# Patient Record
Sex: Male | Born: 1990
Health system: Southern US, Community
[De-identification: ages and names within clinical notes are randomized; demographics above are authoritative.]

## PROBLEM LIST (undated history)

## (undated) DIAGNOSIS — M419 Scoliosis, unspecified: Secondary | ICD-10-CM

## (undated) DIAGNOSIS — K439 Ventral hernia without obstruction or gangrene: Secondary | ICD-10-CM

## (undated) DIAGNOSIS — F319 Bipolar disorder, unspecified: Secondary | ICD-10-CM

## (undated) DIAGNOSIS — D179 Benign lipomatous neoplasm, unspecified: Secondary | ICD-10-CM

## (undated) DIAGNOSIS — F419 Anxiety disorder, unspecified: Secondary | ICD-10-CM

## (undated) DIAGNOSIS — G43909 Migraine, unspecified, not intractable, without status migrainosus: Secondary | ICD-10-CM

## (undated) DIAGNOSIS — F909 Attention-deficit hyperactivity disorder, unspecified type: Secondary | ICD-10-CM

## (undated) DIAGNOSIS — R569 Unspecified convulsions: Secondary | ICD-10-CM

## (undated) DIAGNOSIS — S149XXA Injury of unspecified nerves of neck, initial encounter: Secondary | ICD-10-CM

## (undated) DIAGNOSIS — G589 Mononeuropathy, unspecified: Secondary | ICD-10-CM

## (undated) DIAGNOSIS — M1990 Unspecified osteoarthritis, unspecified site: Secondary | ICD-10-CM

## (undated) DIAGNOSIS — C801 Malignant (primary) neoplasm, unspecified: Secondary | ICD-10-CM

## (undated) DIAGNOSIS — K219 Gastro-esophageal reflux disease without esophagitis: Secondary | ICD-10-CM

## (undated) HISTORY — PX: ESOPHAGUS SURGERY: SHX626

## (undated) HISTORY — DX: Unspecified convulsions: R56.9

---
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
## 2010-06-22 LAB — CBC
HCT: 38.9 % — ABNORMAL LOW (ref 39.0–52.0)
Hemoglobin: 13.4 g/dL (ref 13.0–17.0)
MCHC: 34.4 g/dL (ref 30.0–36.0)
MCV: 90.8 fL (ref 78.0–100.0)
Platelets: 248 10*3/uL (ref 150–400)
RBC: 4.28 MIL/uL (ref 4.22–5.81)
RDW: 13.1 % (ref 11.5–15.5)
WBC: 10 10*3/uL (ref 4.0–10.5)

## 2010-06-22 LAB — COMPREHENSIVE METABOLIC PANEL
ALT: 16 U/L (ref 0–53)
AST: 18 U/L (ref 0–37)
Albumin: 4.5 g/dL (ref 3.5–5.2)
Alkaline Phosphatase: 85 U/L (ref 39–117)
BUN: 12 mg/dL (ref 6–23)
CO2: 24 mEq/L (ref 19–32)
Calcium: 9.4 mg/dL (ref 8.4–10.5)
Chloride: 111 mEq/L (ref 96–112)
Creatinine, Ser: 0.66 mg/dL (ref 0.4–1.5)
GFR calc Af Amer: >60 mL/min (ref >60)
GFR calc non Af Amer: >60 mL/min (ref >60)
Glucose, Bld: 122 mg/dL — ABNORMAL HIGH (ref 70–99)
Potassium: 3.7 mEq/L (ref 3.5–5.1)
Sodium: 143 mEq/L (ref 135–145)
Total Bilirubin: 0.5 mg/dL (ref 0.3–1.2)
Total Protein: 7 g/dL (ref 6.0–8.3)

## 2010-06-22 LAB — DIFFERENTIAL
Basophils Absolute: 0 10*3/uL (ref 0.0–0.1)
Basophils Relative: 0 % (ref 0–1)
Eosinophils Absolute: 0 10*3/uL (ref 0.0–0.7)
Eosinophils Relative: 0 % (ref 0–5)
Lymphocytes Relative: 22 % (ref 12–46)
Lymphs Abs: 2.2 10*3/uL (ref 0.7–4.0)
Monocytes Absolute: 0.7 10*3/uL (ref 0.1–1.0)
Monocytes Relative: 7 % (ref 3–12)
Neutro Abs: 7 10*3/uL (ref 1.7–7.7)
Neutrophils Relative %: 70 % (ref 43–77)

## 2010-06-22 LAB — URINALYSIS, ROUTINE W REFLEX MICROSCOPIC
Bilirubin Urine: NEGATIVE
Glucose, UA: NEGATIVE mg/dL
Hgb urine dipstick: NEGATIVE
Ketones, ur: NEGATIVE mg/dL
Leukocytes, UA: NEGATIVE
Nitrite: NEGATIVE
Protein, ur: NEGATIVE mg/dL
Specific Gravity, Urine: 1.029 (ref 1.005–1.030)
Urobilinogen, UA: 1 mg/dL (ref 0.0–1.0)
pH: 7 (ref 5.0–8.0)

## 2010-06-22 LAB — LIPASE, BLOOD: Lipase: 19 U/L (ref 11–59)

## 2010-06-22 LAB — URINE MICROSCOPIC-ADD ON

## 2013-05-26 ENCOUNTER — Emergency Department (HOSPITAL_COMMUNITY): Payer: Self-pay

## 2013-05-26 ENCOUNTER — Emergency Department (HOSPITAL_COMMUNITY)
Admission: EM | Admit: 2013-05-26 | Discharge: 2013-05-26 | Disposition: A | Discharge status: Home / Self Care | Payer: Self-pay | Attending: Emergency Medicine | Admitting: Emergency Medicine

## 2013-05-26 ENCOUNTER — Encounter (HOSPITAL_COMMUNITY): Payer: Self-pay | Admitting: Emergency Medicine

## 2013-05-26 DIAGNOSIS — M549 Dorsalgia, unspecified: Secondary | ICD-10-CM | POA: Insufficient documentation

## 2013-05-26 DIAGNOSIS — R209 Unspecified disturbances of skin sensation: Secondary | ICD-10-CM | POA: Insufficient documentation

## 2013-05-26 DIAGNOSIS — Z87828 Personal history of other (healed) physical injury and trauma: Secondary | ICD-10-CM | POA: Insufficient documentation

## 2013-05-26 DIAGNOSIS — M129 Arthropathy, unspecified: Secondary | ICD-10-CM | POA: Insufficient documentation

## 2013-05-26 DIAGNOSIS — M542 Cervicalgia: Secondary | ICD-10-CM | POA: Insufficient documentation

## 2013-05-26 DIAGNOSIS — F17200 Nicotine dependence, unspecified, uncomplicated: Secondary | ICD-10-CM | POA: Insufficient documentation

## 2013-05-26 DIAGNOSIS — Z8679 Personal history of other diseases of the circulatory system: Secondary | ICD-10-CM | POA: Insufficient documentation

## 2013-05-26 HISTORY — DX: Migraine, unspecified, not intractable, without status migrainosus: G43.909

## 2013-05-26 HISTORY — DX: Scoliosis, unspecified: M41.9

## 2013-05-26 HISTORY — DX: Unspecified osteoarthritis, unspecified site: M19.90

## 2013-05-26 IMAGING — CR DG CERVICAL SPINE COMPLETE 4+V
6 series · 6 of 6 positions shown · non-contrast
Comparison: none

[view not recorded (1 of 6)]
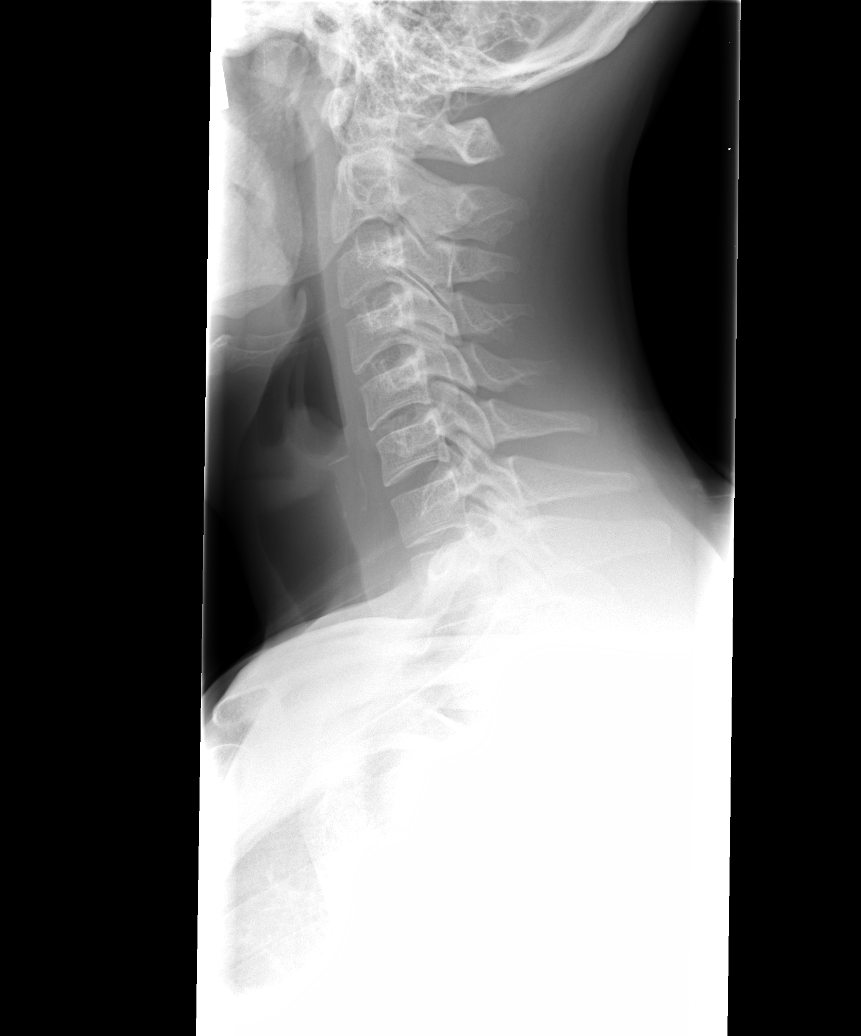

[view not recorded (2 of 6)]
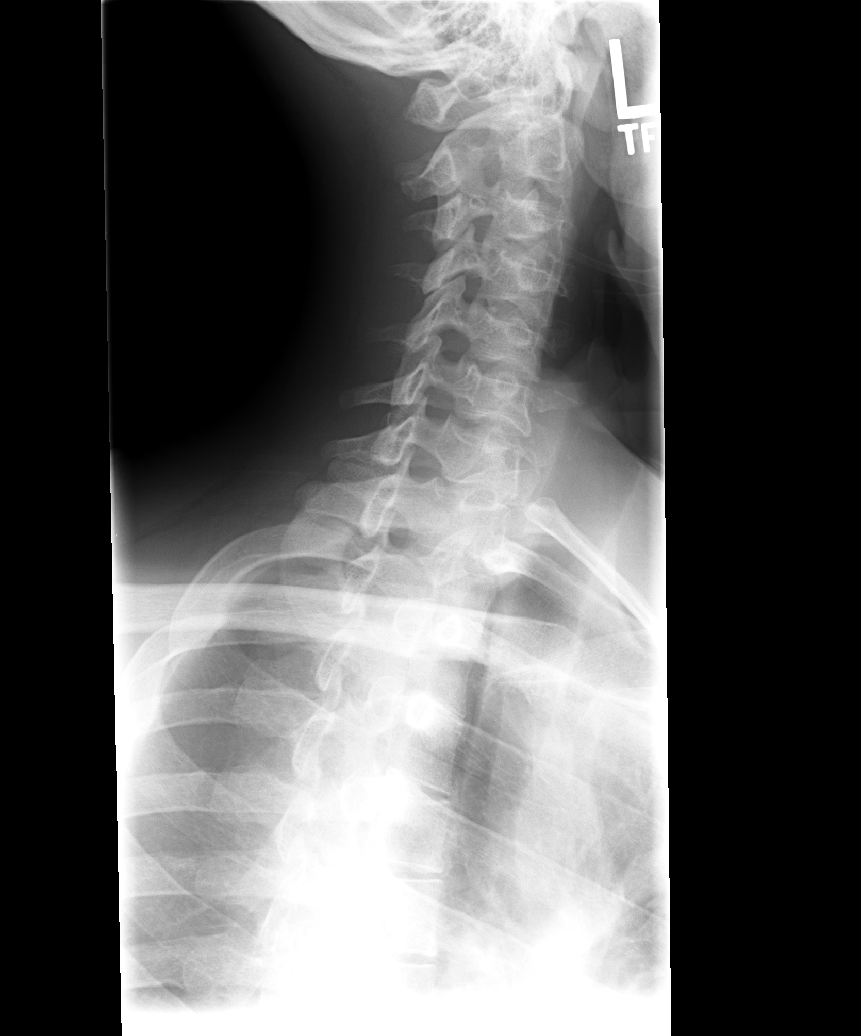

[view not recorded (3 of 6)]
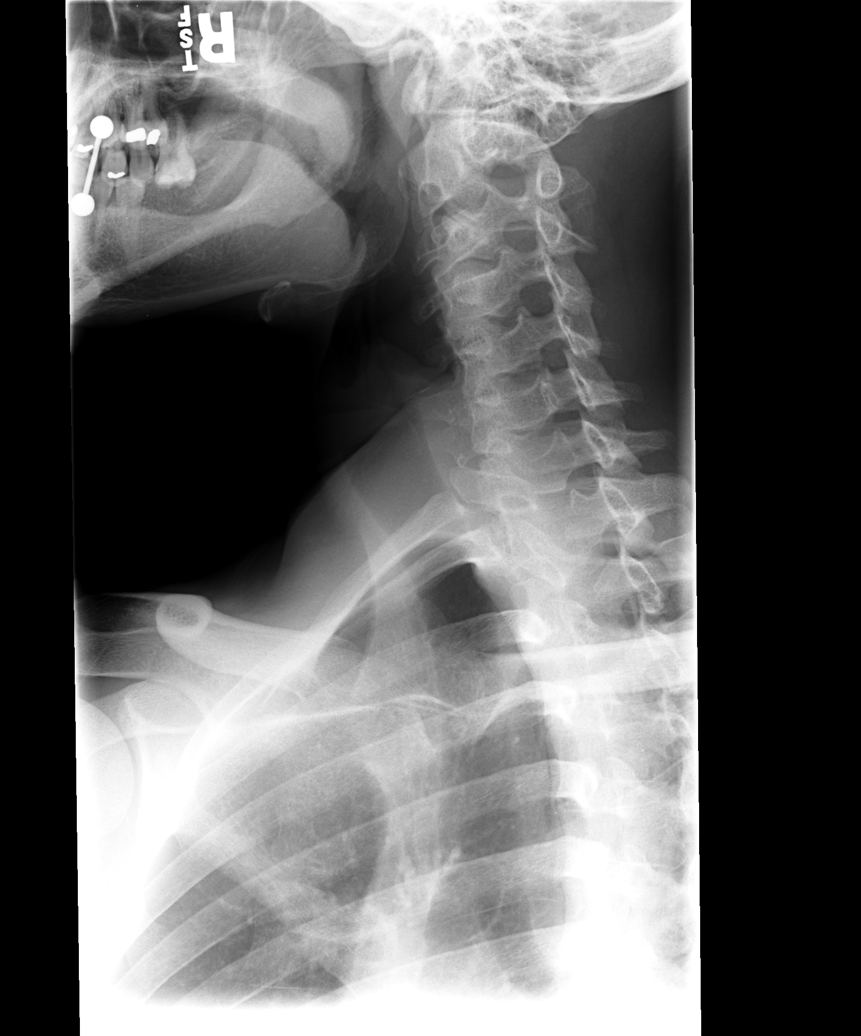

[view not recorded (4 of 6)]
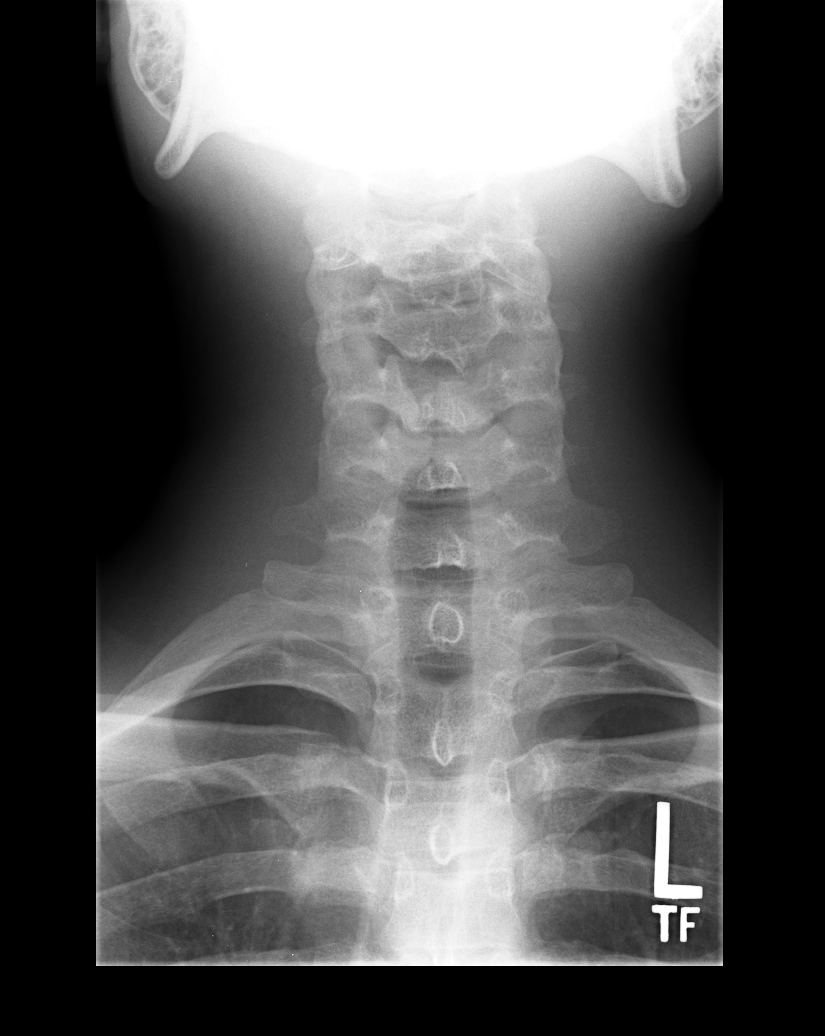

[view not recorded (5 of 6)]
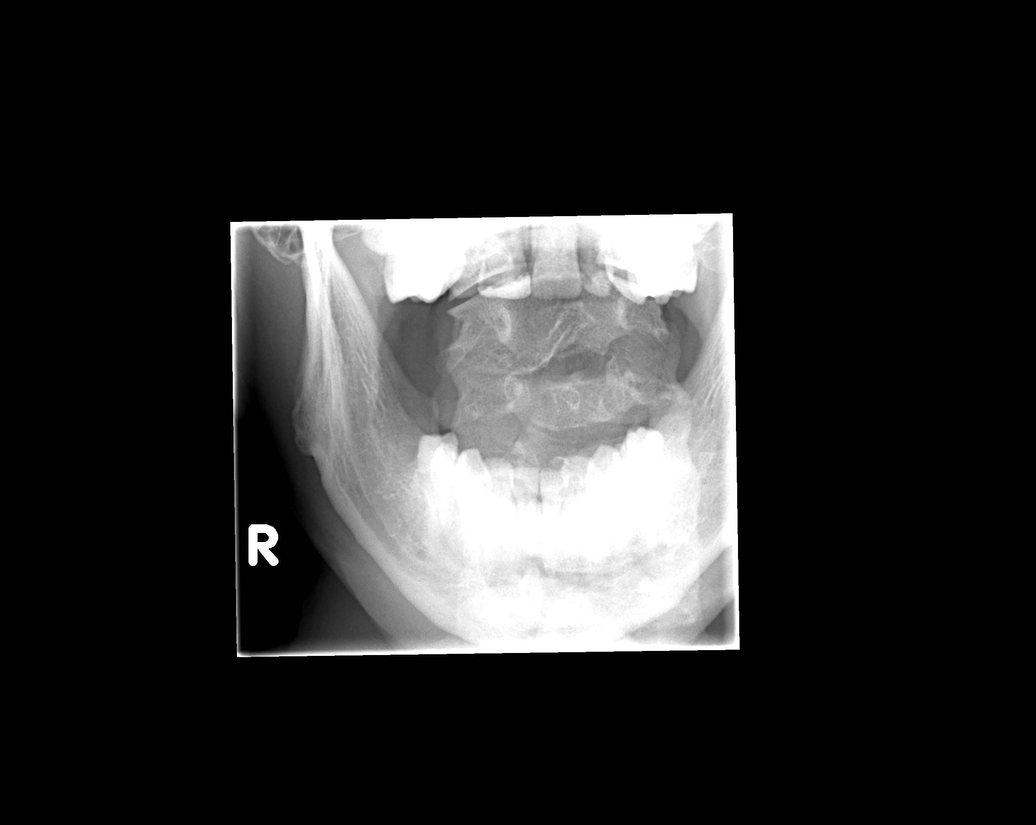

[view not recorded (6 of 6)]
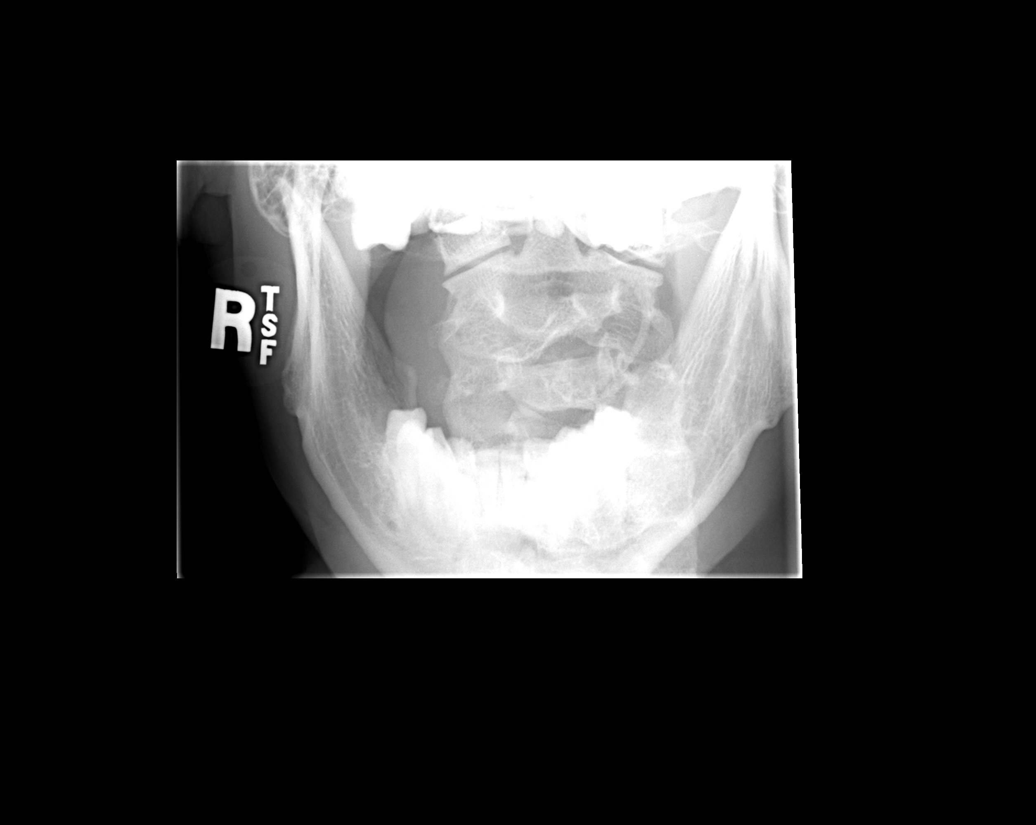

[6 of 6 positions shown; findings below may reference images not displayed]

CLINICAL DATA
Three day history of neck pain which is worse with any movement. No
known injuries

EXAM
CERVICAL SPINE  4+ VIEWS

COMPARISON
None.

FINDINGS
Straightening of the usual cervical lordosis. Anatomic alignment. No
visible fractures. Minimal to mild disc space narrowing at C5-6.
Normal prevertebral soft tissues. Facet joints intact. No
significant bony foraminal stenoses. No static evidence of
instability.

IMPRESSION
Straightening of the usual lordosis which may reflect positioning
and/or spasm. Minimal-to-mild disc space narrowing at C5-6.
Otherwise normal examination.

SIGNATURE

## 2013-05-26 MED ORDER — CYCLOBENZAPRINE HCL 5 MG PO TABS: 5.0000 mg | ORAL_TABLET | Freq: Three times a day (TID) | ORAL | Status: DC | PRN

## 2013-05-26 MED ORDER — PREDNISONE 10 MG PO TABS: ORAL_TABLET | ORAL | Status: DC

## 2013-05-26 MED ORDER — METHOCARBAMOL 500 MG PO TABS: 1000.0000 mg | ORAL_TABLET | Freq: Four times a day (QID) | ORAL | Status: AC

## 2013-05-26 MED ORDER — MELOXICAM 7.5 MG PO TABS: 7.5000 mg | ORAL_TABLET | Freq: Every day | ORAL | Status: DC

## 2013-05-26 NOTE — ED Notes (Signed)
Pt c/o chronic back pain as well as neck pain that began Sunday. Neck pain worsens with any movement. Pt denies injury but states he does a lot of heavy lifting at work.
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
## 2013-05-27 NOTE — ED Provider Notes (Signed)
Medical screening examination/treatment/procedure(s) were performed by non-physician practitioner and as supervising physician I was immediately available for consultation/collaboration.   EKG Interpretation None        Sharyon Cable, MD 05/27/13 [phone]

## 2013-07-18 ENCOUNTER — Emergency Department (HOSPITAL_COMMUNITY)
Admission: EM | Admit: 2013-07-18 | Discharge: 2013-07-18 | Disposition: A | Discharge status: Home / Self Care | Payer: Self-pay | Attending: Emergency Medicine | Admitting: Emergency Medicine

## 2013-07-18 ENCOUNTER — Encounter (HOSPITAL_COMMUNITY): Payer: Self-pay | Admitting: Emergency Medicine

## 2013-07-18 DIAGNOSIS — F411 Generalized anxiety disorder: Secondary | ICD-10-CM | POA: Insufficient documentation

## 2013-07-18 DIAGNOSIS — F419 Anxiety disorder, unspecified: Secondary | ICD-10-CM

## 2013-07-18 DIAGNOSIS — Z8679 Personal history of other diseases of the circulatory system: Secondary | ICD-10-CM | POA: Insufficient documentation

## 2013-07-18 DIAGNOSIS — M129 Arthropathy, unspecified: Secondary | ICD-10-CM | POA: Insufficient documentation

## 2013-07-18 DIAGNOSIS — F17200 Nicotine dependence, unspecified, uncomplicated: Secondary | ICD-10-CM | POA: Insufficient documentation

## 2013-07-18 HISTORY — DX: Attention-deficit hyperactivity disorder, unspecified type: F90.9

## 2013-07-18 LAB — CBC
HCT: 41.4 % (ref 39.0–52.0)
Hemoglobin: 14.3 g/dL (ref 13.0–17.0)
MCH: 30.8 pg (ref 26.0–34.0)
MCHC: 34.5 g/dL (ref 30.0–36.0)
MCV: 89 fL (ref 78.0–100.0)
Platelets: 266 10*3/uL (ref 150–400)
RBC: 4.65 MIL/uL (ref 4.22–5.81)
RDW: 13.1 % (ref 11.5–15.5)
WBC: 8.7 10*3/uL (ref 4.0–10.5)

## 2013-07-18 LAB — COMPREHENSIVE METABOLIC PANEL
ALT: 17 U/L (ref 0–53)
AST: 12 U/L (ref 0–37)
Albumin: 4.2 g/dL (ref 3.5–5.2)
Alkaline Phosphatase: 87 U/L (ref 39–117)
BUN: 11 mg/dL (ref 6–23)
CO2: 22 mEq/L (ref 19–32)
Calcium: 9.4 mg/dL (ref 8.4–10.5)
Chloride: 106 mEq/L (ref 96–112)
Creatinine, Ser: 0.62 mg/dL (ref 0.50–1.35)
GFR calc Af Amer: >90 mL/min (ref >90)
GFR calc non Af Amer: >90 mL/min (ref >90)
Glucose, Bld: 118 mg/dL — ABNORMAL HIGH (ref 70–99)
Potassium: 3.8 mEq/L (ref 3.7–5.3)
Sodium: 143 mEq/L (ref 137–147)
Total Bilirubin: 0.3 mg/dL (ref 0.3–1.2)
Total Protein: 7.1 g/dL (ref 6.0–8.3)

## 2013-07-18 LAB — RAPID URINE DRUG SCREEN, HOSP PERFORMED
Amphetamines: NOT DETECTED
Barbiturates: NOT DETECTED
Benzodiazepines: NOT DETECTED
Cocaine: NOT DETECTED
Opiates: NOT DETECTED
Tetrahydrocannabinol: NOT DETECTED

## 2013-07-18 LAB — SALICYLATE LEVEL: Salicylate Lvl: <2 mg/dL — ABNORMAL LOW (ref 2.8–20.0)

## 2013-07-18 LAB — ETHANOL: Alcohol, Ethyl (B): <11 mg/dL (ref 0–11)

## 2013-07-18 LAB — ACETAMINOPHEN LEVEL: Acetaminophen (Tylenol), Serum: <15 ug/mL (ref 10–30)

## 2013-07-18 NOTE — ED Provider Notes (Signed)
CSN: [id number]     Arrival date & time 07/18/13  1235 History   First MD Initiated Contact with Patient 07/18/13 [phone]     Chief Complaint  Patient presents with  . Stress     (Consider location/radiation/quality/duration/timing/severity/associated sxs/prior Treatment) HPI 23 year old male comes in today stating that he has ongoing or worsening stress. He states that he has had problems because of the loss of his children do to violating parole. He states his wife has subsequently had to move out of the home and ordered we'll get the children back. He states that he feels like he is shaking in his body all the time. He has some problems with sleeping but is eating and has not lost any weight. 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 He does feel better when he is actively engaged in work. He denies any suicidal or homicidal ideation. He states he has a history of being told he was bipolar. He has not taken any medications for 9 years. He has not been hospitalized in the mental health facility since childhood. He states that he was sent away for 6 weeks to 6 months at one time when he was a young child but does not know why. He states he has ADHD Past Medical History  Diagnosis Date  . Arthritis   . Scoliosis   . Migraine   . ADHD (attention deficit hyperactivity disorder)    Past Surgical History  Procedure Laterality Date  . Esophagus surgery     No family history on file. History  Substance Use Topics  . Smoking status: Current Every Day Smoker  . Smokeless tobacco: Not on file  . Alcohol Use: No    Review of Systems  All other systems reviewed and are negative.     Allergies  Depakote; Flexeril; Naproxen; Ritalin; and Tramadol  Home Medications   Prior to Admission medications   Medication Sig Start Date End Date Taking? Authorizing Provider  predniSONE (DELTASONE) 10 MG tablet 6, 5, 4, 3, 2 then 1 tablet by mouth daily for 6 days total. 05/26/13   Evalee Jefferson, PA-C    BP 132/63  Pulse 104  Temp(Src) 98.4 F (36.9 C) (Oral)  Resp 18  Ht 5\' 4"  (1.626 m)  Wt 145 lb (65.772 kg)  BMI 24.88 kg/m2  SpO2 97% Physical Exam  Nursing note and vitals reviewed. Constitutional: He is oriented to person, place, and time. He appears well-developed and well-nourished.  HENT:  Head: Normocephalic and atraumatic.  Right Ear: External ear normal.  Left Ear: External ear normal.  Nose: Nose normal.  Mouth/Throat: Oropharynx is clear and moist.  Eyes: Conjunctivae and EOM are normal. Pupils are equal, round, and reactive to light.  Neck: Normal range of motion. Neck supple.  Cardiovascular: Normal rate, regular rhythm, normal heart sounds and intact distal pulses.   Pulmonary/Chest: Effort normal and breath sounds normal. No respiratory distress. He has no wheezes. He exhibits no tenderness.  Abdominal: Soft. Bowel sounds are normal. He exhibits no distension and no mass. There is no tenderness. There is no guarding.  Musculoskeletal: Normal range of motion.  Neurological: He is alert and oriented to person, place, and time. He has normal reflexes. He exhibits normal muscle tone. Coordination normal.  Skin: Skin is warm and dry.  Psychiatric: He has a normal mood and affect. His behavior is normal. Judgment and thought content normal.  Patient denies suicidal ideation denies homicidal ideation. Endorses that he has some history of  depression but is not actively sad or crying. States he feels shaking all over but does not appear to have any shaking noted.    ED Course  Procedures (including critical care time) Labs Review Labs Reviewed  COMPREHENSIVE METABOLIC PANEL - Abnormal; Notable for the following:    Glucose, Bld 118 (*)    All other components within normal limits  SALICYLATE LEVEL - Abnormal; Notable for the following:    Salicylate Lvl <5.6 (*)    All other components within normal limits  ACETAMINOPHEN LEVEL  CBC  ETHANOL  URINE RAPID DRUG  SCREEN (HOSP PERFORMED)    Imaging Review No results found.   EKG Interpretation None      MDM   Final diagnoses:  Anxiety       Shaune Pollack, MD 07/18/13 2018

## 2013-07-18 NOTE — ED Notes (Signed)
Pt reports he is here for stress. History of migraine and ADHD, has not been taking ADHD medications and states he has a migrane. Denies pain elsewhere. States increased stress for 4 years, wife left him and kids in foster care. States he has too much stress, denies SI or HI.

## 2013-07-18 NOTE — ED Notes (Addendum)
Pt c/o being under stress ongoing for more than 4 years but increasing. Pt reports unable to sleep x 1 month. Pt reports that he had his kids taken last year to foster care and his wife left him last month. Pt denies SI/HI, just can't deal with the stress. Pt reports his youngest kid has special needs and only gets to see him once a week. Pt cannot see the oldest kid because he is not the biological father. Pt is suppose to be taking adderall for ADHD but stopped taking it.

## 2013-07-18 NOTE — Discharge Instructions (Signed)
°Emergency Department Resource Guide °1) Find a Doctor and Pay Out of Pocket °Although you won't have to find out who is covered by your insurance plan, it is a good idea to ask around and get recommendations. You will then need to call the office and see if the doctor you have chosen will accept you as a new patient and what types of options they offer for patients who are self-pay. Some doctors offer discounts or will set up payment plans for their patients who do not have insurance, but you will need to ask so you aren't surprised when you get to your appointment. ° °2) Contact Your Local Health Department °Not all health departments have doctors that can see patients for sick visits, but many do, so it is worth a call to see if yours does. If you don't know where your local health department is, you can check in your phone book. The CDC also has a tool to help you locate your state's health department, and many state websites also have listings of all of their local health departments. ° °3) Find a Walk-in Clinic °If your illness is not likely to be very severe or complicated, you may want to try a walk in clinic. These are popping up all over the country in pharmacies, drugstores, and shopping centers. They're usually staffed by nurse practitioners or physician assistants that have been trained to treat common illnesses and complaints. They're usually fairly quick and inexpensive. However, if you have serious medical issues or chronic medical problems, these are probably not your best option. ° °No Primary Care Doctor: °- Call [facility] at  832-8000 - they can help you locate a primary care doctor that  accepts your insurance, provides certain services, etc. °- Physician Referral Service- 1-800-533-3463 ° °Chronic Pain Problems: °Organization         Address  Phone   Notes  °[facility]  (336) 297-2271 Patients need to be referred by their primary care doctor.  ° °Medication  Assistance: °Organization         Address  Phone   Notes  °[place] [facility] [address] °[place], [place] [address] (336) 641-8030 --Must be a resident of [place] °-- Must have NO insurance coverage whatsoever (no Medicaid/ Medicare, etc.) °-- The pt. MUST have a primary care doctor that directs their care regularly and follows them in the community °  °[facility]  (866) 331-1348   °[employer]  (888) 892-1162   ° °Agencies that provide inexpensive medical care: °Organization         Address  Phone   Notes  °[facility]  (336) 832-8035   ° Internal Medicine    (336) 832-7272   °[facility] [address] °[place], [place] [address] (336) 832-4777   °[facility] [address], °[place] (336) 271-4999   °[facility]    (336) 373-0678   °[facility]    (336) 272-1050   °[facility] ° [address], [place] Phone:  (336) 832-4444, Fax:  (336) 832-4440 Hours of Operation:  9 am - 6 pm, M-F.  Also accepts Medicaid/Medicare and self-pay.  °[facility] ° [address],  Phone: (336) 832-3150, Fax: (336) 832-3151. Hours of Operation:  8:30 am - 5:30 pm, M-F.  Also accepts Medicaid and self-pay.  °[facility] [place] [address], [place] Phone: (336) 878-6027   °[facility] [address], [place], [place] (336)723-1848, Ext. 123 Mondays & Thursdays: 7-9 AM.  First 15 patients are seen on a first come, first serve basis. °  ° °Medicaid-accepting [place] Providers: ° °Organization         Address  Phone   Notes  °[facility] [address], [place] (336) 641-2100 Also accepts self-pay patients.  °[facility] [address], [place] ° (336) 856-9996   °[facility] [address], [place]  (336) 288-8857   °[facility] [address] [place] [address], [place] (336) 299-7000   °Veita Bland [address], [place]  ° (336) 373-1557 Only accepts [place] Access Medicaid patients after they have their name applied to their card.  ° °Self-Pay (no insurance) in [place]: ° °Organization         Address  Phone   Notes  °Sickle Cell Patients, [facility] [address], [place] (336) 832-1970   °[facility] [address], [place] (336) 832-4400   °[facility] ° [address],  (336) 992-4800   °[facility]/Dr. Osei-Bonsu ° [address] [place] [address], [place] or [address], [place] (336) 841-8500 Phone number for both [place] and [place] locations is the same.  °[facility] [address], [place] (336) 299-0000   °[facility] [address] [place] [address], [place] or [address] (336) 852-7530 °(336) 878-2260   °[facility] [address], [place] (336) 350-1642, phone; (336) 294-5005, fax Sees patients 1st and 3rd Saturday of every month.  Must not qualify for public or private insurance (i.e. Medicaid, Medicare, [place] Health Choice, Veterans' Benefits) • Household income should be no more than 200% of the poverty level •The clinic cannot treat you if you are pregnant or think you are pregnant • Sexually transmitted diseases are not treated at the clinic.  ° ° °Dental Care: °Organization         Address  Phone  Notes  °[place] [facility] [address], [place] (336) 641-6152 Accepts children up to age 21 who are enrolled in Medicaid or [place] Health Choice; pregnant women with a Medicaid card; and children who have applied for Medicaid or [place] Health Choice, but were declined, whose parents can pay a reduced fee at time of service.  °[place]  [facility] [place]  [address], [place] (336) 641-7733 Accepts children up to age 21 who are enrolled in Medicaid or [place] Health Choice; pregnant women with a Medicaid card; and children who have applied for Medicaid or [place] Health Choice, but were declined, whose parents can pay a reduced fee at time of service.  °[facility] ° [address],  (336) 641-4533 Patients are seen by appointment only. Walk-ins are not accepted. [facility] will see patients 18 years of age and older. °Monday - Tuesday (8am-5pm) °Most Wednesdays (8:30-5pm) °$30 per visit, cash only  °[facility] ° [address], [place] (336) 641-4533 Patients are seen by appointment only. Walk-ins are not accepted. [facility] will see patients 18 years of age and older. °One   Wednesday Evening (Monthly: Volunteer Based).  $30 per visit, cash only  °[facility]  (919) 537-3737 for adults; Children under age 4, call Graduate Pediatric Dentistry at (919) [phone]. Children aged 4-14, please call (919) 537-3737 to request a pediatric application. ° Dental services are provided in all areas of dental care including fillings, crowns and bridges, complete and partial dentures, implants, gum treatment, root canals, and extractions. Preventive care is also provided. Treatment is provided to both adults and children. °Patients are selected via a lottery and there is often a waiting list. °  °[facility] [address], °[place] ° (336) 763-8833 [email or web address] °  °[facility] [address], [place], [place] (336)723-1848, Ext. 123 Second and Fourth Thursday of each month, opens at 6:30 AM; Clinic ends at 9 AM.  Patients are seen on a first-come first-served basis, and a limited number are seen during each clinic.  ° °[facility] ° [address], [place], [place] (336) 723-7904    Eligibility Requirements °You must have lived in [place], [place], or [place] counties for at least the last three months. °  You cannot be eligible for state or federal sponsored healthcare insurance, including Veterans Administration, Medicaid, or Medicare. °  You generally cannot be eligible for healthcare insurance through your employer.  °  How to apply: °Eligibility screenings are held every Tuesday and Wednesday afternoon from 1:00 pm until 4:00 pm. You do not need an appointment for the interview!  °[facility] [address], [place], [place] 336-631-2330   °[facility]  336-342-8273   °[facility]  336-703-3100   °[facility]  336-570-6415   ° °Behavioral Health Resources in the Community: °Intensive Outpatient Programs °Organization         Address  Phone  Notes  °[facility] [address], [place], [place] 336-878-6098   °[facility] [address], [place], [place] 336-832-9800   °[facility] [address], [place], [place] ° 336-882-2125   °[facility] [address],  °[place], [place] 1-800-853-5163 or 336-641-4981   °Substance Abuse Resources °Organization         Address  Phone  Notes  °[facility]  336-882-2125   °[facility]  336-784-9470   °[facility]  336-285-9073   °[facility]  336-845-3988   °Residential & Outpatient Substance Abuse Program  1-800-659-3381   °Psychological Services °Organization         Address  Phone  Notes  °[facility]  336- 832-9600   °[facility]  336- 378-7881   °[facility] [address], [place] 1-800-853-5163 or 336-641-4981   ° °Mobile Crisis Teams °Organization         Address  Phone  Notes  °[facility], Mobile Crisis Care Unit  1-877-626-1772   °Assertive °[facility] ° [address]  [place], [place] 336-834-9664   °Sharon DeEsch [address] °[place] 336-554-5454   ° °Self-Help/Support Groups °Organization         Address  Phone             Notes  °[facility] [place] - variety of support groups  336- 373-1402 Call for more information  °Narcotics Anonymous (NA), [facility] [address], °[place]  2 meetings at this location  ° °  Residential Treatment Programs °Organization         Address  Phone  Notes  °[facility] [address],    °[place]  1-866-801-8205   °[facility] ° [address], [place], [place] 704-293-8524   °[facility] [address], [place] 336-845-3988 Admissions: 8am-3pm M-F  °[facility] [address]    °[place], [place] 336-841-1104   °[facility] [address], [place], [place] 336-379-7146   °[facility] [address]  °[place], [place] 336-285-9073   °[facility] - Intensive Outpatient [address], [place], [place] 336-852-3033   °[facility] [address]  °[place], [place] 1-877-615-2722 or 336-784-9470   °[facility] [address] [place], [place] 336-227-7417 Accepts Medicaid  °[facility] [address]  °[place] 1-800-659-3381 Substance Abuse/Addiction Treatment  ° °[place] Behavioral Health Resources °Organization         Address  Phone  Notes  °[facility]  (888) 581-9988   °Julie Brannon, PhD [address] [place], [place]   (336) 349-5553 or (336) 951-0000   °[facility]   [address] °[place], [place] (336) 349-4454   °[facility] [address], [place], [place] (336) 342-8316 Insurance/Medicaid/sponsorship through [facility]  °[facility] [address]                                    [place], [place] (336) 342-8316 Therapy/tele-psych/case    °[facility] [address]  ° [place], [place] (336) 349-2233    °Dr. Arfeen  (336) 349-4544   °[facility] [place]  [employer] [place] [facility] 1) [address], [place] °2) [address], [place] °3)  [address], [place] (336) 349-3220 °(336) 342-7768 ° °(336) 342-8140   °[place] [facility] (336) 342-1394 or (336) 342-3537 (After Hours)    ° ° °

## 2013-07-18 NOTE — ED Notes (Signed)
Pt came to desk asking if his name was called. Pt informed we called him 3 times throughout the entire wait area. Pt willing to wait for next available bed.

## 2013-07-18 NOTE — ED Notes (Signed)
Name called x 3

## 2013-07-22 ENCOUNTER — Encounter (HOSPITAL_COMMUNITY): Payer: Self-pay | Admitting: Emergency Medicine

## 2013-07-22 ENCOUNTER — Emergency Department (HOSPITAL_COMMUNITY)
Admission: EM | Admit: 2013-07-22 | Discharge: 2013-07-22 | Discharge status: Against Medical Advice | Payer: Self-pay | Attending: Emergency Medicine | Admitting: Emergency Medicine

## 2013-07-22 DIAGNOSIS — F1110 Opioid abuse, uncomplicated: Secondary | ICD-10-CM | POA: Insufficient documentation

## 2013-07-22 DIAGNOSIS — F329 Major depressive disorder, single episode, unspecified: Secondary | ICD-10-CM | POA: Insufficient documentation

## 2013-07-22 DIAGNOSIS — L0201 Cutaneous abscess of face: Secondary | ICD-10-CM | POA: Insufficient documentation

## 2013-07-22 DIAGNOSIS — M129 Arthropathy, unspecified: Secondary | ICD-10-CM | POA: Insufficient documentation

## 2013-07-22 DIAGNOSIS — F411 Generalized anxiety disorder: Secondary | ICD-10-CM | POA: Insufficient documentation

## 2013-07-22 DIAGNOSIS — IMO0002 Reserved for concepts with insufficient information to code with codable children: Secondary | ICD-10-CM | POA: Insufficient documentation

## 2013-07-22 DIAGNOSIS — F3289 Other specified depressive episodes: Secondary | ICD-10-CM | POA: Insufficient documentation

## 2013-07-22 DIAGNOSIS — L03211 Cellulitis of face: Secondary | ICD-10-CM

## 2013-07-22 DIAGNOSIS — Z8679 Personal history of other diseases of the circulatory system: Secondary | ICD-10-CM | POA: Insufficient documentation

## 2013-07-22 DIAGNOSIS — F17200 Nicotine dependence, unspecified, uncomplicated: Secondary | ICD-10-CM | POA: Insufficient documentation

## 2013-07-22 LAB — CBC WITH DIFFERENTIAL/PLATELET
Basophils Absolute: 0 10*3/uL (ref 0.0–0.1)
Basophils Relative: 0 % (ref 0–1)
Eosinophils Absolute: 0.1 10*3/uL (ref 0.0–0.7)
Eosinophils Relative: 1 % (ref 0–5)
HCT: 39.7 % (ref 39.0–52.0)
Hemoglobin: 13.6 g/dL (ref 13.0–17.0)
Lymphocytes Relative: 34 % (ref 12–46)
Lymphs Abs: 2.8 10*3/uL (ref 0.7–4.0)
MCH: 30.1 pg (ref 26.0–34.0)
MCHC: 34.3 g/dL (ref 30.0–36.0)
MCV: 87.8 fL (ref 78.0–100.0)
Monocytes Absolute: 0.6 10*3/uL (ref 0.1–1.0)
Monocytes Relative: 7 % (ref 3–12)
Neutro Abs: 4.8 10*3/uL (ref 1.7–7.7)
Neutrophils Relative %: 58 % (ref 43–77)
Platelets: 277 10*3/uL (ref 150–400)
RBC: 4.52 MIL/uL (ref 4.22–5.81)
RDW: 13 % (ref 11.5–15.5)
WBC: 8.3 10*3/uL (ref 4.0–10.5)

## 2013-07-22 LAB — COMPREHENSIVE METABOLIC PANEL
ALT: 14 U/L (ref 0–53)
AST: 10 U/L (ref 0–37)
Albumin: 4.1 g/dL (ref 3.5–5.2)
Alkaline Phosphatase: 81 U/L (ref 39–117)
BUN: 12 mg/dL (ref 6–23)
CO2: 23 mEq/L (ref 19–32)
Calcium: 9.2 mg/dL (ref 8.4–10.5)
Chloride: 104 mEq/L (ref 96–112)
Creatinine, Ser: 0.63 mg/dL (ref 0.50–1.35)
GFR calc Af Amer: >90 mL/min (ref >90)
GFR calc non Af Amer: >90 mL/min (ref >90)
Glucose, Bld: 82 mg/dL (ref 70–99)
Potassium: 3.8 mEq/L (ref 3.7–5.3)
Sodium: 139 mEq/L (ref 137–147)
Total Bilirubin: 0.3 mg/dL (ref 0.3–1.2)
Total Protein: 6.8 g/dL (ref 6.0–8.3)

## 2013-07-22 LAB — ETHANOL: Alcohol, Ethyl (B): <11 mg/dL (ref 0–11)

## 2013-07-22 MED ORDER — ACETAMINOPHEN 325 MG PO TABS: 650.0000 mg | ORAL_TABLET | ORAL | Status: DC | PRN

## 2013-07-22 MED ORDER — NICOTINE 21 MG/24HR TD PT24: 21.0000 mg | MEDICATED_PATCH | Freq: Every day | TRANSDERMAL | Status: DC

## 2013-07-22 MED ORDER — LORAZEPAM 1 MG PO TABS: 1.0000 mg | ORAL_TABLET | Freq: Three times a day (TID) | ORAL | Status: DC | PRN

## 2013-07-22 MED ORDER — ZOLPIDEM TARTRATE 5 MG PO TABS: 5.0000 mg | ORAL_TABLET | Freq: Every evening | ORAL | Status: DC | PRN

## 2013-07-22 MED ORDER — ONDANSETRON HCL 4 MG PO TABS: 4.0000 mg | ORAL_TABLET | Freq: Three times a day (TID) | ORAL | Status: DC | PRN

## 2013-07-22 NOTE — ED Notes (Signed)
After consult pt trying to leave because he didn't want to take off his underwear, RN told him that he could just stay in the triage room and not take his underwear off, then he wanted to go smoke, pt made his way outside the ED, pt is voluntary and not suicidal. PA aware that pt has gone, TSS spoke with family.

## 2013-07-22 NOTE — ED Notes (Signed)
Pt also requesting detox from opiates.

## 2013-07-22 NOTE — ED Notes (Signed)
Pt here voluntarily with ex wife and mother in law, pt is under a lot of stress because they are separated and DSS has suggested that he get some help or he will not be able to see his children who are currently in foster care

## 2013-07-22 NOTE — ED Provider Notes (Signed)
CSN: [id number]     Arrival date & time 07/22/13  1909 History   First MD Initiated Contact with Patient 07/22/13 2008     This chart was scribed for non-physician practitioner, Hazel Sams, PA-C working with Richarda Blade, MD by Forrestine Him, ED Scribe. This patient was seen in room WTR3/WLPT3 and the patient's care was started at 9:06 PM.   Chief Complaint  Patient presents with  . Medical Clearance   The history is provided by the patient. No language interpreter was used.    HPI Comments: Jimmy [name] is a 23 y.o. Male with a PMHx of ADHD who presents to the Emergency Department seeking medical clearance today. Pt states he is here voluntarily with his ex wife and mother in law. States he is under a lot of ongoing stress due to recently being separated. Pt says DSS has advised him to seek help in order for him to see his children again. Relative states children are currently in foster care and they are in the process of trying to get them back. He states he has been experiencing depression and "shakyness". He has been using narcotic pills (non specified) and last used about 2 days ago. Relatives state he has been attempting to get help for some time now, however, "no one wants to help him". He denies any SI at time time. However, he states he has felt suicidal in the past. At this time he denies any nausea, vomiting, abdominal pain, or chills. He is an every day smoker and has expressed interest in quitting. No other concerns this visit.  Past Medical History  Diagnosis Date  . Arthritis   . Scoliosis   . Migraine   . ADHD (attention deficit hyperactivity disorder)    Past Surgical History  Procedure Laterality Date  . Esophagus surgery     History reviewed. No pertinent family history. History  Substance Use Topics  . Smoking status: Current Every Day Smoker  . Smokeless tobacco: Not on file  . Alcohol Use: No    Review of Systems  Constitutional: Negative for fever and  chills.  HENT: Negative for congestion.   Eyes: Negative for redness.  Respiratory: Negative for cough.   Gastrointestinal: Negative for nausea, vomiting and abdominal pain.  Skin: Positive for wound (Small abscess behind L earlobe). Negative for rash.  Neurological: Negative for headaches.  Psychiatric/Behavioral: Negative for suicidal ideas. The patient is nervous/anxious.       Allergies  Depakote; Flexeril; Naproxen; Ritalin; and Tramadol  Home Medications   Prior to Admission medications   Medication Sig Start Date End Date Taking? Authorizing Provider  predniSONE (DELTASONE) 10 MG tablet 6, 5, 4, 3, 2 then 1 tablet by mouth daily for 6 days total. 05/26/13   Evalee Jefferson, PA-C   Triage Vitals: BP 141/68  Pulse 92  Temp(Src) 98.1 F (36.7 C) (Oral)  Resp 16  SpO2 98%   Physical Exam  Nursing note and vitals reviewed. Constitutional: He is oriented to person, place, and time. He appears well-developed and well-nourished.  HENT:  Head: Normocephalic and atraumatic.  Eyes: EOM are normal.  Neck: Normal range of motion.  Cardiovascular: Normal rate.   Pulmonary/Chest: Effort normal.  Musculoskeletal: Normal range of motion.  Neurological: He is alert and oriented to person, place, and time.  Skin: Skin is warm and dry.  Small fluctuant swollen nodule behind the L earlobe No bleeding  Psychiatric: His behavior is normal. His mood appears anxious. He exhibits  a depressed mood. He expresses no suicidal ideation.    ED Course  Procedures (including critical care time)  DIAGNOSTIC STUDIES: Oxygen Saturation is 98% on RA, Normal by my interpretation.    COORDINATION OF CARE: 9:17 PM- Will perform I&D. Discussed treatment plan with pt at bedside and pt agreed to plan.   9:20 PM - INCISION AND DRAINAGE PROCEDURE NOTE: Patient identification was confirmed and verbal consent was obtained. This procedure was performed by Hazel Sams, PA-C  at 9:20 PM. Site: Small abscess  behind L earlobe Sterile procedures observed Anesthetic used (type and amt): 1 CC of 2% Lidocaine without epinephrine  Drainage: Small solidified discharge Complexity: Simple Packing used: None Site anesthetized, incision made over site, wound drained and explored loculations, rinsed with copious amounts of normal saline, wound packed with sterile gauze, covered with dry, sterile dressing.  Pt tolerated procedure well without complications.  Instructions for care discussed verbally and pt provided with additional written instructions for homecare and f/u.    9:35 PM- Advised pt to follow up wit a Dermatologist for further evaluation and care.    Patient left AMA. He was voluntary denies SI or HI.   MDM   Final diagnoses:  Narcotic abuse     I personally performed the services described in this documentation, which was scribed in my presence. The recorded information has been reviewed and is accurate.    Martie Lee, PA-C 07/22/13 2241

## 2013-07-22 NOTE — ED Notes (Signed)
TTS in evaluating patient 

## 2013-07-22 NOTE — BH Assessment (Signed)
Assessment Note  Jimmy [name] is an 23 y.o. male.  Patient was brought into the ED by his family because of depression and drug abuse.  Patient currently denies SI, HI, and hallucinations.  Patient reports he has had suicidal ideations without a plan in the past 2 weeks. Patient reports current depression symptoms includes overwhelming guilt, tearful, hopelessness, and worthlessness.  Patient reports his current stressors are his children are in foster care, wife left him, court date on 07/29/13, needing to comply with mandates of probation, bills, and his youngest child 74yo is medically fragile.  Patient reports in the past year being assessed by [facility] but never following up with other appointments.    Patient reports current drug use includes Pain Pills (Vicodin) daily, snort, various mg, and last used 3 days ago.  Patient admits to using [facility] and cocaine in the past but none this year.  Patient denies current withdrawal symptoms or history of withdrawal.    Patient gave CSW permission to speak with the wife and mother n law for collateral.  Wife reports that the patient is mandated under his probation to participate with a 30 inpatient treatment program but he will not comply.  She reports that he called her this week saying she ingested 30 headache pills and his was at [facility] but they did not confirm this information.  Family reports they are concerned with the patient's drug use and him potentially being put in jail if he do not comply with probation.    Patient refused to complete this assessment and left the building AMA.  Wife and mother n law requested an officer to escort them to the car for concerns the patient will harass them.  CSW informed the officer at the triage desk to escort the family.      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 Arthritis   . Scoliosis   . Migraine   .  ADHD (attention deficit hyperactivity disorder)    Axis IV: economic problems, educational problems, housing problems, occupational problems, other psychosocial or environmental problems, problems related to legal system/crime, problems related to social environment, problems with access to health care services and problems with primary support group Axis V: 51-60 moderate symptoms  Past Medical History:  Past Medical History  Diagnosis Date  . Arthritis   . Scoliosis   . Migraine   . ADHD (attention deficit hyperactivity disorder)     Past Surgical History  Procedure Laterality Date  . Esophagus surgery      Family History: History reviewed. No pertinent family history.  Social History:  reports that he has been smoking.  He does not have any smokeless tobacco history on file. He reports that he does not drink alcohol or use illicit drugs.  Additional Social History:     CIWA: CIWA-Ar BP: 141/68 mmHg Pulse Rate: 92 COWS:    Allergies:  Allergies  Allergen Reactions  . Depakote [Divalproex Sodium] Other (See Comments)    "made him crazy"  . Flexeril [Cyclobenzaprine] Nausea Only  . Naproxen Nausea Only    Acid reflux  . Ritalin [Methylphenidate Hcl] Other (See Comments)    Increased hyper activeness   . Tramadol Other (See Comments)    "acid"    Home Medications:  (Not in a hospital admission)  OB/GYN Status:  No LMP for male patient.  General Assessment Data Location of Assessment:  [facility] ED ACT Assessment: Yes Is this a Tele or Face-to-Face Assessment?: Face-to-Face Is this an Initial Assessment or a Re-assessment for this encounter?: Initial Assessment Living Arrangements: Other relatives;Parent Can pt return to current living arrangement?: Yes Admission Status: Voluntary Is patient capable of signing voluntary admission?: Yes Transfer from: Home Referral Source: Self/Family/Friend  Medical Screening Exam ([place]) Medical Exam completed:  Yes  [place] Living Arrangements: Other relatives;Parent Name of Therapist: None  Education Status Is patient currently in school?: No Highest grade of school patient has completed: 11th  Risk to self Suicidal Ideation: No-Not Currently/Within Last 6 Months Suicidal Intent: No-Not Currently/Within Last 6 Months Is patient at risk for suicide?: No Suicidal Plan?: No-Not Currently/Within Last 6 Months Access to Means: No What has been your use of drugs/alcohol within the last 12 months?: daily Previous Attempts/Gestures: Yes How many times?: 1 Other Self Harm Risks: none reported Triggers for Past Attempts: Family contact;Other personal contacts;Spouse contact;Other (Comment) (legal) Intentional Self Injurious Behavior: None Family Suicide History: Unknown Recent stressful life event(s): Conflict (Comment);Loss (Comment);Legal Issues;Financial Problems;Other (Comment) Persecutory voices/beliefs?: No Depression: Yes Depression Symptoms: Guilt;Feeling worthless/self pity;Feeling angry/irritable;Loss of interest in usual pleasures;Despondent Substance abuse history and/or treatment for substance abuse?: Yes Suicide prevention information given to non-admitted patients: Not applicable  Risk to Others Homicidal Ideation: No-Not Currently/Within Last 6 Months Thoughts of Harm to Others: No-Not Currently Present/Within Last 6 Months Current Homicidal Intent: No-Not Currently/Within Last 6 Months Current Homicidal Plan: No-Not Currently/Within Last 6 Months Access to Homicidal Means: No Identified Victim: noen History of harm to others?: No Assessment of Violence: None Noted Does patient have access to weapons?: No Criminal Charges Pending?: Yes Describe Pending Criminal Charges: Forgery/Fraud, DWI Does patient have a court date: Yes Court Date: 07/29/13  Psychosis Hallucinations: None noted Delusions: None noted  Mental Status Report Appear/Hygiene:  Disheveled Eye Contact: Poor Motor Activity: Freedom of movement Speech: Soft;Slow Level of Consciousness: Alert;Irritable;Crying Mood: Labile;Suspicious;Irritable;Sad;Anhedonia Affect: Irritable;Sad;Labile;Depressed Anxiety Level: Moderate Thought Processes: Coherent Judgement: Unimpaired Orientation: Person;Place;Time;Situation Obsessive Compulsive Thoughts/Behaviors: None  Cognitive Functioning Concentration: Decreased Memory: Recent Intact;Remote Intact IQ: Average Insight: Fair Impulse Control: Fair Appetite: Fair Vegetative Symptoms: None  ADLScreening [facility] Assessment Services) Patient's cognitive ability adequate to safely complete daily activities?: Yes Patient able to express need for assistance with ADLs?: Yes Independently performs ADLs?: Yes (appropriate for developmental age)  Prior Inpatient Therapy Prior Inpatient Therapy: No  Prior Outpatient Therapy Prior Outpatient Therapy: Yes Prior Therapy Dates: unknown Prior Therapy Facilty/Provider(s): [facility] Reason for Treatment: MH/SA  ADL Screening (condition at time of admission) Patient's cognitive ability adequate to safely complete daily activities?: Yes Patient able to express need for assistance with ADLs?: Yes Independently performs ADLs?: Yes (appropriate for developmental age)         Values / Beliefs Cultural Requests During Hospitalization: None Spiritual Requests During Hospitalization: None        Additional Information 1:1 In Past 12 Months?: No CIRT Risk: No Elopement Risk: Yes Does patient have medical clearance?: Yes     Disposition:  Disposition Initial Assessment Completed for this Encounter: Yes Disposition of Patient: Other dispositions (AMA) Other disposition(s): Other (Comment) (AMA)  On Site Evaluation by:   Reviewed with Physician:    Edmund Hilda 07/22/2013 10:48 PM

## 2013-07-23 NOTE — ED Provider Notes (Signed)
Medical screening examination/treatment/procedure(s) were performed by non-physician practitioner and as supervising physician I was immediately available for consultation/collaboration.  Richarda Blade, MD 07/23/13 [phone]

## 2013-08-01 ENCOUNTER — Encounter (HOSPITAL_COMMUNITY): Payer: Self-pay | Admitting: Emergency Medicine

## 2013-08-01 ENCOUNTER — Emergency Department (HOSPITAL_COMMUNITY)
Admission: EM | Admit: 2013-08-01 | Discharge: 2013-08-02 | Disposition: A | Discharge status: Home / Self Care | Payer: Self-pay | Attending: Emergency Medicine | Admitting: Emergency Medicine

## 2013-08-01 DIAGNOSIS — Z8739 Personal history of other diseases of the musculoskeletal system and connective tissue: Secondary | ICD-10-CM | POA: Insufficient documentation

## 2013-08-01 DIAGNOSIS — F1120 Opioid dependence, uncomplicated: Secondary | ICD-10-CM | POA: Diagnosis present

## 2013-08-01 DIAGNOSIS — F17200 Nicotine dependence, unspecified, uncomplicated: Secondary | ICD-10-CM | POA: Insufficient documentation

## 2013-08-01 DIAGNOSIS — R45851 Suicidal ideations: Secondary | ICD-10-CM | POA: Insufficient documentation

## 2013-08-01 DIAGNOSIS — Z8679 Personal history of other diseases of the circulatory system: Secondary | ICD-10-CM | POA: Insufficient documentation

## 2013-08-01 DIAGNOSIS — F1910 Other psychoactive substance abuse, uncomplicated: Secondary | ICD-10-CM

## 2013-08-01 DIAGNOSIS — F39 Unspecified mood [affective] disorder: Secondary | ICD-10-CM | POA: Insufficient documentation

## 2013-08-01 DIAGNOSIS — F1410 Cocaine abuse, uncomplicated: Secondary | ICD-10-CM | POA: Insufficient documentation

## 2013-08-01 LAB — CBC WITH DIFFERENTIAL/PLATELET
Basophils Absolute: 0 10*3/uL (ref 0.0–0.1)
Basophils Relative: 0 % (ref 0–1)
Eosinophils Absolute: 0.1 10*3/uL (ref 0.0–0.7)
Eosinophils Relative: 1 % (ref 0–5)
HCT: 40.4 % (ref 39.0–52.0)
Hemoglobin: 13.7 g/dL (ref 13.0–17.0)
Lymphocytes Relative: 25 % (ref 12–46)
Lymphs Abs: 2.6 10*3/uL (ref 0.7–4.0)
MCH: 30.4 pg (ref 26.0–34.0)
MCHC: 33.9 g/dL (ref 30.0–36.0)
MCV: 89.6 fL (ref 78.0–100.0)
Monocytes Absolute: 0.5 10*3/uL (ref 0.1–1.0)
Monocytes Relative: 5 % (ref 3–12)
Neutro Abs: 7.1 10*3/uL (ref 1.7–7.7)
Neutrophils Relative %: 69 % (ref 43–77)
Platelets: 282 10*3/uL (ref 150–400)
RBC: 4.51 MIL/uL (ref 4.22–5.81)
RDW: 13.1 % (ref 11.5–15.5)
WBC: 10.3 10*3/uL (ref 4.0–10.5)

## 2013-08-01 LAB — COMPREHENSIVE METABOLIC PANEL
ALT: 20 U/L (ref 0–53)
AST: 16 U/L (ref 0–37)
Albumin: 4.1 g/dL (ref 3.5–5.2)
Alkaline Phosphatase: 80 U/L (ref 39–117)
BUN: 8 mg/dL (ref 6–23)
CO2: 21 mEq/L (ref 19–32)
Calcium: 9.3 mg/dL (ref 8.4–10.5)
Chloride: 106 mEq/L (ref 96–112)
Creatinine, Ser: 0.69 mg/dL (ref 0.50–1.35)
GFR calc Af Amer: >90 mL/min (ref >90)
GFR calc non Af Amer: >90 mL/min (ref >90)
Glucose, Bld: 132 mg/dL — ABNORMAL HIGH (ref 70–99)
Potassium: 3.7 mEq/L (ref 3.7–5.3)
Sodium: 141 mEq/L (ref 137–147)
Total Bilirubin: <0.2 mg/dL — ABNORMAL LOW (ref 0.3–1.2)
Total Protein: 6.9 g/dL (ref 6.0–8.3)

## 2013-08-01 LAB — RAPID URINE DRUG SCREEN, HOSP PERFORMED
Amphetamines: NOT DETECTED
Barbiturates: NOT DETECTED
Benzodiazepines: NOT DETECTED
Cocaine: POSITIVE — AB
Opiates: NOT DETECTED
Tetrahydrocannabinol: NOT DETECTED

## 2013-08-01 LAB — ETHANOL: Alcohol, Ethyl (B): <11 mg/dL (ref 0–11)

## 2013-08-01 LAB — SALICYLATE LEVEL: Salicylate Lvl: <2 mg/dL — ABNORMAL LOW (ref 2.8–20.0)

## 2013-08-01 LAB — ACETAMINOPHEN LEVEL: Acetaminophen (Tylenol), Serum: <15 ug/mL (ref 10–30)

## 2013-08-01 MED ORDER — NICOTINE 21 MG/24HR TD PT24
21.0000 mg | MEDICATED_PATCH | Freq: Every day | TRANSDERMAL | Status: DC
  Administered 2013-08-01: 21 mg via TRANSDERMAL
  Filled 2013-08-01: qty 1

## 2013-08-01 MED ORDER — ZOLPIDEM TARTRATE 5 MG PO TABS: 5.0000 mg | ORAL_TABLET | Freq: Every evening | ORAL | Status: DC | PRN

## 2013-08-01 MED ORDER — ONDANSETRON HCL 4 MG PO TABS: 4.0000 mg | ORAL_TABLET | Freq: Three times a day (TID) | ORAL | Status: DC | PRN

## 2013-08-01 MED ORDER — HYDROXYZINE HCL 25 MG PO TABS
50.0000 mg | ORAL_TABLET | Freq: Once | ORAL | Status: AC
  Administered 2013-08-01: 50 mg via ORAL
  Filled 2013-08-01: qty 2

## 2013-08-01 MED ORDER — ACETAMINOPHEN 325 MG PO TABS
650.0000 mg | ORAL_TABLET | ORAL | Status: DC | PRN
  Administered 2013-08-01: 650 mg via ORAL
  Filled 2013-08-01: qty 2

## 2013-08-01 MED ORDER — ALUM & MAG HYDROXIDE-SIMETH 200-200-20 MG/5ML PO SUSP: 30.0000 mL | ORAL | Status: DC | PRN

## 2013-08-01 MED ORDER — TRAZODONE HCL 50 MG PO TABS
50.0000 mg | ORAL_TABLET | Freq: Every evening | ORAL | Status: DC | PRN
  Filled 2013-08-01: qty 1

## 2013-08-01 NOTE — ED Notes (Signed)
1 bag of belongings given to psych ed tech

## 2013-08-01 NOTE — ED Notes (Signed)
Patient became irritated that he needed to give up phone to another patient and is now requesting to leave.

## 2013-08-01 NOTE — ED Notes (Signed)
Pt states he can not void at this time.  

## 2013-08-01 NOTE — ED Notes (Signed)
Pt has jeans black sandals  Blue shirt boxer shorts

## 2013-08-01 NOTE — ED Notes (Signed)
He is calm and agrees to voluntary admisson.  Will call report shortly.

## 2013-08-01 NOTE — ED Notes (Signed)
He cites being currently involved in a custody battle for his son; and also dealing with issues of opioid dependency.  He states he is anxious and depressed, and has occasional suicidal ideation without plan; and he does not have access to any dangerous weapons.  He states he was recently seen by a judge, who recommend he receives inpatient opioid dependency/detox treatment.  He is cooperative, and in no distress.

## 2013-08-01 NOTE — ED Notes (Signed)
He is agitated at the thought of not having his cell phone at all times and several of our security staff speak with him at this time.  Dr. Alvino Chapel also notified pt. Has questions for him.

## 2013-08-01 NOTE — BH Assessment (Signed)
Assessment Note  Jimmy [name] is an 23 y.o. male. Patient reports SI without a plan.  Patient reports that he attempted suicide 2 weeks ago by overdosing on aspirin.  Patient reports that he did not come to the ER after he took the overdose of aspirin.  Patient reports that he is not able to contract for safety.  Patient reports an increased level of stress and depression because he is going through divorce and his children are going to get taken away from him.   Patient reports that he is also addicted to opiates. Patient reports that he uses around 10 Percocet or Vicodin at day.  Patient reports that he will take them orally or struck them. Patient reports that his last use was yesterday when he used 10 percocet and vicodin.  Patient reports that his longest period of sobriety was for one month after he got out of jail. Patient denies current withdrawal symptoms or history of withdrawal or seizures   Patient reports that he lives with his mother and he does have family supports.  Patient reports current depression symptoms includes overwhelming guilt, tearful, hopelessness, and worthlessness.   Patient denies HI and psychosis.  Patient denies physical, sexual or emotional abuse.      Axis I: Major Depression, Recurrent severe Axis II: Deferred Axis III:  Past Medical History  Diagnosis Date  . Arthritis   . Scoliosis   . Migraine   . ADHD (attention deficit hyperactivity disorder)    Axis IV: economic problems, housing problems, occupational problems, other psychosocial or environmental problems, problems related to legal system/crime, problems related to social environment, problems with access to health care services and problems with primary support group Axis V: 31-40 impairment in reality testing  Past Medical History:  Past Medical History  Diagnosis Date  . Arthritis   . Scoliosis   . Migraine   . ADHD (attention deficit hyperactivity disorder)     Past Surgical  History  Procedure Laterality Date  . Esophagus surgery      Family History: History reviewed. No pertinent family history.  Social History:  reports that he has been smoking.  He does not have any smokeless tobacco history on file. He reports that he uses illicit drugs. He reports that he does not drink alcohol.  Additional Social History:     CIWA: CIWA-Ar BP: 142/69 mmHg Pulse Rate: 105 COWS:    Allergies:  Allergies  Allergen Reactions  . Depakote [Divalproex Sodium] Other (See Comments)    "made him crazy"  . Flexeril [Cyclobenzaprine] Nausea Only  . Naproxen Nausea Only    Acid reflux  . Ritalin [Methylphenidate Hcl] Other (See Comments)    Increased hyper activeness   . Tramadol Other (See Comments)    "acid"    Home Medications:  (Not in a hospital admission)  OB/GYN Status:  No LMP for male patient.  General Assessment Data [facility]: [facility] ACT Assessment: Yes Is this a Tele or Face-to-Face Assessment?: Face-to-Face Is this an Initial Assessment or a Re-assessment for this encounter?: Initial Assessment Living Arrangements: Other relatives;Parent Can pt return to current living arrangement?: Yes Admission Status: Voluntary Is patient capable of signing voluntary admission?: Yes Transfer from: [facility] Referral Source: Self/Family/Friend  Medical Screening Exam ([place]) Medical Exam completed: Yes  [place] Living Arrangements: Other relatives;Parent Name of Psychiatrist: None Reported Name of Therapist: None  Education Status Is patient currently in school?: No Current Grade: NA  Highest grade of school patient has completed: 11th Name of school: NA Contact person: NA  Risk to self Suicidal Ideation: Yes-Currently Present Suicidal Intent: Yes-Currently Present Is patient at risk for suicide?: Yes Suicidal Plan?: No Specify Current Suicidal Plan: None Reported Access to Means: No What has been your  use of drugs/alcohol within the last 12 months?: Opiates  Previous Attempts/Gestures: Yes How many times?: 1 Other Self Harm Risks: None Reported Triggers for Past Attempts: Family contact;Other personal contacts;Spouse contact;Other (Comment) (Seperated from his wife and children) Intentional Self Injurious Behavior: None Family Suicide History: Unknown Recent stressful life event(s): Conflict (Comment);Job Loss;Financial Problems;Legal Issues Persecutory voices/beliefs?: No Depression: Yes Depression Symptoms: Despondent;Insomnia;Tearfulness;Isolating;Fatigue;Guilt;Loss of interest in usual pleasures;Feeling worthless/self pity;Feeling angry/irritable Substance abuse history and/or treatment for substance abuse?: Yes Suicide prevention information given to non-admitted patients: Yes  Risk to Others Homicidal Ideation: No Thoughts of Harm to Others: No Current Homicidal Intent: No Current Homicidal Plan: No Access to Homicidal Means: No Identified Victim: None Reported History of harm to others?: No Assessment of Violence: None Noted Violent Behavior Description: None Does patient have access to weapons?: No Criminal Charges Pending?: Yes Describe Pending Criminal Charges: Forgery/Fraud/DWI Does patient have a court date: Yes Court Date:  (Unable to remember)  Psychosis Hallucinations: None noted Delusions: None noted  Mental Status Report Appear/Hygiene: Disheveled Eye Contact: Poor Motor Activity: Freedom of movement Speech: Soft;Slow Level of Consciousness: Alert;Irritable;Crying Mood: Depressed;Anxious Affect: Irritable;Sad;Labile;Depressed Anxiety Level: Minimal Thought Processes: Coherent;Relevant Judgement: Unimpaired Orientation: Person;Place;Time;Situation Obsessive Compulsive Thoughts/Behaviors: Minimal  Cognitive Functioning Concentration: Decreased Memory: Recent Intact;Remote Intact IQ: Average Insight: Fair Impulse Control: Fair Appetite:  Fair Weight Loss: 0 Weight Gain: 0 Sleep: Decreased Total Hours of Sleep: 5 Vegetative Symptoms: Decreased grooming;Not bathing;Staying in bed  ADLScreening [facility] Assessment Services) Patient's cognitive ability adequate to safely complete daily activities?: Yes Patient able to express need for assistance with ADLs?: Yes Independently performs ADLs?: Yes (appropriate for developmental age)  Prior Inpatient Therapy Prior Inpatient Therapy: No Prior Therapy Dates: NA Prior Therapy Facilty/Provider(s): NA Reason for Treatment: NA  Prior Outpatient Therapy Prior Outpatient Therapy: Yes Prior Therapy Dates:  (1981) Prior Therapy Facilty/Provider(s): [facility] Reason for Treatment: MH/SA  ADL Screening (condition at time of admission) Patient's cognitive ability adequate to safely complete daily activities?: Yes Patient able to express need for assistance with ADLs?: Yes Independently performs ADLs?: Yes (appropriate for developmental age)         Values / Beliefs Cultural Requests During Hospitalization: None Spiritual Requests During Hospitalization: None        Additional Information 1:1 In Past 12 Months?: No CIRT Risk: No Elopement Risk: Yes Does patient have medical clearance?: Yes     Disposition: Pending psych disposition.  Disposition Initial Assessment Completed for this Encounter: Yes Disposition of Patient: Other dispositions Other disposition(s): Other (Comment)  On Site Evaluation by:   Reviewed with Physician:    Marjorie Smolder 08/01/2013 6:06 PM

## 2013-08-01 NOTE — ED Provider Notes (Signed)
CSN: [id number]     Arrival date & time 08/01/13  1539 History   First MD Initiated Contact with Patient 08/01/13 1559     Chief Complaint  Patient presents with  . Medical Clearance     (Consider location/radiation/quality/duration/timing/severity/associated sxs/prior Treatment) The history is provided by the patient.   patient presents requesting detox off of opiates. He states he'll use around 10 Percocet or Vicodin at day. He will take them orally or struck them. He last used yesterday. Over last year there was one month when he was sober, that was after he got out of jail. He states that he wants detox. He states he has a lot of stress in his life. He states that he is going through divorce and his children are going to get taken away from him. He states he just told him that he should go to detox. He denies other substance abuse. He also states she's been having suicidal thoughts. He states he doesn't think he would do it but also states that he attempted suicide 2 weeks ago by overdosing on aspirin. He states he does not have a plan on how to do it now. He states he does have a history of depression.  Past Medical History  Diagnosis Date  . Arthritis   . Scoliosis   . Migraine   . ADHD (attention deficit hyperactivity disorder)    Past Surgical History  Procedure Laterality Date  . Esophagus surgery     History reviewed. No pertinent family history. History  Substance Use Topics  . Smoking status: Current Every Day Smoker  . Smokeless tobacco: Not on file  . Alcohol Use: No    Review of Systems  Constitutional: Negative for activity change and appetite change.  Eyes: Negative for pain.  Respiratory: Negative for chest tightness and shortness of breath.   Cardiovascular: Negative for chest pain and leg swelling.  Gastrointestinal: Negative for nausea, vomiting, abdominal pain and diarrhea.  Genitourinary: Negative for flank pain.  Musculoskeletal: Negative for back pain  and neck stiffness.  Skin: Negative for rash.  Neurological: Negative for weakness, numbness and headaches.  Psychiatric/Behavioral: Positive for suicidal ideas and dysphoric mood. Negative for behavioral problems.      Allergies  Depakote; Flexeril; Naproxen; Ritalin; and Tramadol  Home Medications   Prior to Admission medications   Not on File   BP 126/74  Pulse 70  Temp(Src) 97.8 F (36.6 C) (Oral)  Resp 18  SpO2 100% Physical Exam  Nursing note and vitals reviewed. Constitutional: He is oriented to person, place, and time. He appears well-developed and well-nourished.  HENT:  Head: Normocephalic and atraumatic.  Eyes: EOM are normal. Pupils are equal, round, and reactive to light.  Neck: Normal range of motion. Neck supple.  Cardiovascular: Normal rate, regular rhythm and normal heart sounds.   No murmur heard. Pulmonary/Chest: Effort normal and breath sounds normal.  Abdominal: Soft. Bowel sounds are normal. He exhibits no distension and no mass. There is no tenderness. There is no rebound and no guarding.  Musculoskeletal: Normal range of motion. He exhibits no edema.  Neurological: He is alert and oriented to person, place, and time. No cranial nerve deficit.  Skin: Skin is warm and dry.  Psychiatric: He has a normal mood and affect.    ED Course  Procedures (including critical care time) Labs Review Labs Reviewed  COMPREHENSIVE METABOLIC PANEL - Abnormal; Notable for the following:    Glucose, Bld 132 (*)    Total  Bilirubin <0.2 (*)    All other components within normal limits  URINE RAPID DRUG SCREEN (HOSP PERFORMED) - Abnormal; Notable for the following:    Cocaine POSITIVE (*)    All other components within normal limits  SALICYLATE LEVEL - Abnormal; Notable for the following:    Salicylate Lvl <4.6 (*)    All other components within normal limits  CBC WITH DIFFERENTIAL  ETHANOL  ACETAMINOPHEN LEVEL    Imaging Review No results found.   EKG  Interpretation None      MDM   Final diagnoses:  Substance abuse  Suicidal ideation    Patient presents requesting detox off of opioids. He denies other drug use. States he last used yesterday. States that he uses around [employer] or Vicodin a day. He also states that he is suicidal. He states in the last 2 weeks he is attempted overdose on aspirin. He appears to medically cleared at this time. His drug screen shows only cocaine. He has been involuntarily committed after no longer wanted to stay. He meets inpatient criteria.    Jasper Riling. Alvino Chapel, MD 08/01/13 [phone]

## 2013-08-01 NOTE — BH Assessment (Signed)
Reviewed pt previous assessment and consulted with Serena Colonel, NP who agrees that pt meets inpatient criteria. Notified Dr. Alvino Chapel of recommendations. TTS will seek placement at other facilities.

## 2013-08-02 ENCOUNTER — Encounter (HOSPITAL_COMMUNITY): Payer: Self-pay | Admitting: Psychiatry

## 2013-08-02 DIAGNOSIS — F1910 Other psychoactive substance abuse, uncomplicated: Secondary | ICD-10-CM

## 2013-08-02 DIAGNOSIS — F1120 Opioid dependence, uncomplicated: Secondary | ICD-10-CM

## 2013-08-02 NOTE — Progress Notes (Signed)
Pt psychiatrically stable for dc home. Pt plans to f/u with [facility] and [facility] for rehab. CSW provided pt with list of outpatient  Substance abuse resources. .No further Clinical Social Work needs, signing off.   Noreene Larsson [phone]  ED CSW 08/02/2013 [id number]

## 2013-08-02 NOTE — Consult Note (Signed)
[facility] Face-to-Face Psychiatry Consult   Reason for Consult:  Opiate dependency Referring Physician:  EDP  Jimmy [name] is an 23 y.o. male. Total Time spent with patient: 20 minutes  Assessment: AXIS I:  Substance Abuse; opiate dependency AXIS II:  Deferred AXIS III:   Past Medical History  Diagnosis Date  . Arthritis   . Scoliosis   . Migraine   . ADHD (attention deficit hyperactivity disorder)    AXIS IV:  other psychosocial or environmental problems, problems related to legal system/crime, problems related to social environment and problems with primary support group AXIS V:  61-70 mild symptoms  Plan:  No evidence of imminent risk to self or others at present.  Dr.  assessed the patient and concurs with the findings.  Subjective:   Jimmy [name] is a 23 y.o. male patient does not warrant admission.  HPI:  Patient stated he told the ED he was suicidal because someone told him to say that to get opiate detox. He denies suicidal ideations and no prior attempts. Mr. Jimmy [name] wants to go to [facility] and/or [facility] for a total of 30 days so he can be released from court to continue to be able to see his children.  The court wanted him to do 30 days or he risks not being able to see his children, he currently sees once a week.  Denies suicidal/homicidal ideations and hallucinations.  Family support in place.  HPI Elements:   Location:  generalized. Quality:  acute. Severity:  mild. Timing:  intermittent. Duration:  months. Context:  chronic opiate abuse.  Past Psychiatric History: Past Medical History  Diagnosis Date  . Arthritis   . Scoliosis   . Migraine   . ADHD (attention deficit hyperactivity disorder)     reports that he has been smoking.  He does not have any smokeless tobacco history on file. He reports that he uses illicit drugs. He reports that he does not drink alcohol. History reviewed. No pertinent family history. Family History Substance Abuse: Yes,  Describe: (Opiates) Family Supports: Yes, List: (Mother) Living Arrangements: Other relatives;Parent Can pt return to current living arrangement?: Yes   Allergies:   Allergies  Allergen Reactions  . Depakote [Divalproex Sodium] Other (See Comments)    "made him crazy"  . Flexeril [Cyclobenzaprine] Nausea Only  . Naproxen Nausea Only    Acid reflux  . Ritalin [Methylphenidate Hcl] Other (See Comments)    Increased hyper activeness   . Tramadol Other (See Comments)    "acid"    ACT Assessment Complete:  Yes:    Educational Status    Risk to Self: Risk to self Suicidal Ideation: Yes-Currently Present Suicidal Intent: Yes-Currently Present Is patient at risk for suicide?: Yes Suicidal Plan?: No Specify Current Suicidal Plan: None Reported Access to Means: No What has been your use of drugs/alcohol within the last 12 months?: Opiates  Previous Attempts/Gestures: Yes How many times?: 1 Other Self Harm Risks: None Reported Triggers for Past Attempts: Family contact;Other personal contacts;Spouse contact;Other (Comment) (Seperated from his wife and children) Intentional Self Injurious Behavior: None Family Suicide History: Unknown Recent stressful life event(s): Conflict (Comment);Job Loss;Financial Problems;Legal Issues Persecutory voices/beliefs?: No Depression: Yes Depression Symptoms: Despondent;Insomnia;Tearfulness;Isolating;Fatigue;Guilt;Loss of interest in usual pleasures;Feeling worthless/self pity;Feeling angry/irritable Substance abuse history and/or treatment for substance abuse?: Yes Suicide prevention information given to non-admitted patients: Yes  Risk to Others: Risk to Others Homicidal Ideation: No Thoughts of Harm to Others: No Current Homicidal Intent: No Current Homicidal Plan: No   Access to Homicidal Means: No Identified Victim: None Reported History of harm to others?: No Assessment of Violence: None Noted Violent Behavior Description: None Does patient  have access to weapons?: No Criminal Charges Pending?: Yes Describe Pending Criminal Charges: Forgery/Fraud/DWI Does patient have a court date: Yes Court Date:  (Unable to remember)  Abuse:    Prior Inpatient Therapy: Prior Inpatient Therapy Prior Inpatient Therapy: No Prior Therapy Dates: NA Prior Therapy Facilty/Provider(s): NA Reason for Treatment: NA  Prior Outpatient Therapy: Prior Outpatient Therapy Prior Outpatient Therapy: Yes Prior Therapy Dates:  (1981) Prior Therapy Facilty/Provider(s): [facility] Reason for Treatment: MH/SA  Additional Information: Additional Information 1:1 In Past 12 Months?: No CIRT Risk: No Elopement Risk: Yes Does patient have medical clearance?: Yes                  Objective: Blood pressure 117/60, pulse 71, temperature 97.9 F (36.6 C), temperature source Oral, resp. rate 16, SpO2 100.00%.There is no weight on file to calculate BMI. Results for orders placed during the hospital encounter of 08/01/13 (from the past 72 hour(s))  CBC WITH DIFFERENTIAL     Status: None   Collection Time    08/01/13  3:53 PM      Result Value Ref Range   WBC 10.3  4.0 - 10.5 K/uL   RBC 4.51  4.22 - 5.81 MIL/uL   Hemoglobin 13.7  13.0 - 17.0 g/dL   HCT 40.4  39.0 - 52.0 %   MCV 89.6  78.0 - 100.0 fL   MCH 30.4  26.0 - 34.0 pg   MCHC 33.9  30.0 - 36.0 g/dL   RDW 13.1  11.5 - 15.5 %   Platelets 282  150 - 400 K/uL   Neutrophils Relative % 69  43 - 77 %   Neutro Abs 7.1  1.7 - 7.7 K/uL   Lymphocytes Relative 25  12 - 46 %   Lymphs Abs 2.6  0.7 - 4.0 K/uL   Monocytes Relative 5  3 - 12 %   Monocytes Absolute 0.5  0.1 - 1.0 K/uL   Eosinophils Relative 1  0 - 5 %   Eosinophils Absolute 0.1  0.0 - 0.7 K/uL   Basophils Relative 0  0 - 1 %   Basophils Absolute 0.0  0.0 - 0.1 K/uL  COMPREHENSIVE METABOLIC PANEL     Status: Abnormal   Collection Time    08/01/13  3:53 PM      Result Value Ref Range   Sodium 141  137 - 147 mEq/L   Potassium 3.7  3.7  - 5.3 mEq/L   Chloride 106  96 - 112 mEq/L   CO2 21  19 - 32 mEq/L   Glucose, Bld 132 (*) 70 - 99 mg/dL   BUN 8  6 - 23 mg/dL   Creatinine, Ser 0.69  0.50 - 1.35 mg/dL   Calcium 9.3  8.4 - 10.5 mg/dL   Total Protein 6.9  6.0 - 8.3 g/dL   Albumin 4.1  3.5 - 5.2 g/dL   AST 16  0 - 37 U/L   Comment: SLIGHT HEMOLYSIS     HEMOLYSIS AT THIS LEVEL MAY AFFECT RESULT   ALT 20  0 - 53 U/L   Alkaline Phosphatase 80  39 - 117 U/L   Total Bilirubin <0.2 (*) 0.3 - 1.2 mg/dL   GFR calc non Af Amer >90  >90 mL/min   GFR calc Af Amer >90  >90 mL/min  Comment: (NOTE)     The eGFR has been calculated using the CKD EPI equation.     This calculation has not been validated in all clinical situations.     eGFR's persistently <90 mL/min signify possible Chronic Kidney     Disease.  ETHANOL     Status: None   Collection Time    08/01/13  3:53 PM      Result Value Ref Range   Alcohol, Ethyl (B) <11  0 - 11 mg/dL   Comment:            LOWEST DETECTABLE LIMIT FOR     SERUM ALCOHOL IS 11 mg/dL     FOR MEDICAL PURPOSES ONLY  ACETAMINOPHEN LEVEL     Status: None   Collection Time    08/01/13  3:53 PM      Result Value Ref Range   Acetaminophen (Tylenol), Serum <15.0  10 - 30 ug/mL   Comment:            THERAPEUTIC CONCENTRATIONS VARY     SIGNIFICANTLY. A RANGE OF 10-30     ug/mL MAY BE AN EFFECTIVE     CONCENTRATION FOR MANY PATIENTS.     HOWEVER, SOME ARE BEST TREATED     AT CONCENTRATIONS OUTSIDE THIS     RANGE.     ACETAMINOPHEN CONCENTRATIONS     >150 ug/mL AT 4 HOURS AFTER     INGESTION AND >50 ug/mL AT 12     HOURS AFTER INGESTION ARE     OFTEN ASSOCIATED WITH TOXIC     REACTIONS.  SALICYLATE LEVEL     Status: Abnormal   Collection Time    08/01/13  3:53 PM      Result Value Ref Range   Salicylate Lvl <2.0 (*) 2.8 - 20.0 mg/dL  URINE RAPID DRUG SCREEN (HOSP PERFORMED)     Status: Abnormal   Collection Time    08/01/13  6:35 PM      Result Value Ref Range   Opiates NONE DETECTED   NONE DETECTED   Cocaine POSITIVE (*) NONE DETECTED   Benzodiazepines NONE DETECTED  NONE DETECTED   Amphetamines NONE DETECTED  NONE DETECTED   Tetrahydrocannabinol NONE DETECTED  NONE DETECTED   Barbiturates NONE DETECTED  NONE DETECTED   Comment:            DRUG SCREEN FOR MEDICAL PURPOSES     ONLY.  IF CONFIRMATION IS NEEDED     FOR ANY PURPOSE, NOTIFY LAB     WITHIN 5 DAYS.                LOWEST DETECTABLE LIMITS     FOR URINE DRUG SCREEN     Drug Class       Cutoff (ng/mL)     Amphetamine      1000     Barbiturate      200     Benzodiazepine   200     Tricyclics       300     Opiates          300     Cocaine          300     THC              50   Labs are reviewed and are pertinent for no medical issues noted.  Current Facility-Administered Medications  Medication Dose Route Frequency Provider Last Rate Last Dose  . acetaminophen (TYLENOL)   tablet 650 mg  650 mg Oral Q4H PRN Jasper Riling. Pickering, MD   650 mg at 08/01/13 2000  . alum & mag hydroxide-simeth (MAALOX/MYLANTA) 200-200-20 MG/5ML suspension 30 mL  30 mL Oral PRN Jasper Riling. Pickering, MD      . nicotine (NICODERM CQ - dosed in mg/24 hours) patch 21 mg  21 mg Transdermal Daily Lurena Nida, NP   21 mg at 08/01/13 2002  . ondansetron (ZOFRAN) tablet 4 mg  4 mg Oral Q8H PRN Jasper Riling. Pickering, MD      . traZODone (DESYREL) tablet 50 mg  50 mg Oral QHS PRN Lurena Nida, NP       No current outpatient prescriptions on file.   Psychiatric Specialty Exam:     Blood pressure 126/74, pulse 70, temperature 97.8 F (36.6 C), temperature source Oral, resp. rate 18, SpO2 100.00%.There is no weight on file to calculate BMI.  General Appearance: Casual  Eye Contact::  Good  Speech:  Normal Rate  Volume:  Normal  Mood:  Irritable  Affect:  Congruent  Thought Process:  Coherent  Orientation:  Full (Time, Place, and Person)  Thought Content:  WDL  Suicidal Thoughts:  No  Homicidal Thoughts:  No  Memory:  Immediate;    Good Recent;   Good Remote;   Good  Judgement:  Good  Insight:  Fair  Psychomotor Activity:  Normal  Concentration:  Good  Recall:  Good  Fund of Knowledge:Good  Language: Good  Akathisia:  No  Handed:  Right  AIMS (if indicated):     Assets:  Desire for Improvement Housing Leisure Time Physical Health Resilience Social Support  Sleep:       Musculoskeletal: Strength & Muscle Tone: within normal limits Gait & Station: normal Patient leans: N/A  Treatment Plan Summary: Discharge home with follow-up with drug rehab at [facility] and/or [facility].  Waylan Boga, PMH-NP 08/02/2013 12:19 PM I have personally seen the patient and agreed with the findings and involved in the treatment plan. Merian Capron, MD

## 2013-08-02 NOTE — [facility] Suicide Risk Assessment (Signed)
Suicide Risk Assessment  Discharge Assessment     Demographic Factors:  Male  Total Time spent with patient: 20 minutes  Psychiatric Specialty Exam:     Blood pressure 126/74, pulse 70, temperature 97.8 F (36.6 C), temperature source Oral, resp. rate 18, SpO2 100.00%.There is no weight on file to calculate BMI.  General Appearance: Casual  Eye Contact::  Good  Speech:  Normal Rate  Volume:  Normal  Mood:  Irritable  Affect:  Congruent  Thought Process:  Coherent  Orientation:  Full (Time, Place, and Person)  Thought Content:  WDL  Suicidal Thoughts:  No  Homicidal Thoughts:  No  Memory:  Immediate;   Good Recent;   Good Remote;   Good  Judgement:  Good  Insight:  Fair  Psychomotor Activity:  Normal  Concentration:  Good  Recall:  Good  Fund of Knowledge:Good  Language: Good  Akathisia:  No  Handed:  Right  AIMS (if indicated):     Assets:  Desire for Improvement Housing Leisure Time Physical Health Resilience Social Support  Sleep:       Musculoskeletal: Strength & Muscle Tone: within normal limits Gait & Station: normal Patient leans: N/A   Mental Status Per Nursing Assessment::   On Admission:   Patient stated he told the ED he was suicidal because someone told him that to get opiate detox.  He denies suicidal ideations and no prior attempts.  Mr. Skoda wants to go to [facility] and/or [facility].  Current Mental Status by Physician: NA  Loss Factors: NA  Historical Factors: NA  Risk Reduction Factors:   Responsible for children under 82 years of age, Sense of responsibility to family, Living with another person, especially a relative, Positive social support and Positive coping skills or problem solving skills  Continued Clinical Symptoms:  Opiate dependency  Cognitive Features That Contribute To Risk:  None   Suicide Risk:  Minimal: No identifiable suicidal ideation.  Patients presenting with no risk factors but with morbid ruminations; may be  classified as minimal risk based on the severity of the depressive symptoms  Discharge Diagnoses:   AXIS I:  Opiate dependency AXIS II:  Deferred AXIS III:   Past Medical History  Diagnosis Date  . Arthritis   . Scoliosis   . Migraine   . ADHD (attention deficit hyperactivity disorder)    AXIS IV:  other psychosocial or environmental problems, problems related to social environment and problems with primary support group AXIS V:  61-70 mild symptoms  Plan Of Care/Follow-up recommendations:  Activity:  as tolerated Diet:  low-sodium heart healthy diet  Is patient on multiple antipsychotic therapies at discharge:  No   Has Patient had three or more failed trials of antipsychotic monotherapy by history:  No  Recommended Plan for Multiple Antipsychotic Therapies: NA    Waylan Boga, [place] 08/02/2013, 12:14 PM

## 2013-08-02 NOTE — Progress Notes (Signed)
[facility] initiated inpatient placement at the following hospitals:  1)[facility]-no beds 2)[facility]no beds 3)[facility]no beds 4)[facility]-faxed referral 5)[facility]-no beds 6)[facility]-faxed referral 7)[facility]-faxed referral 8)[facility]-no beds 9)[facility]-faxed referral 10)[facility]-no beds 11)[facility]-no beds 12)[facility]-faxed referral  Wyvonnia Dusky, MHT/NS

## 2016-03-09 IMAGING — US US ABDOMEN LIMITED
1 series · 14 of 25 positions shown · non-contrast
Comparison: None.

CLINICAL DATA: Right upper quadrant pain

EXAM:
US ABDOMEN LIMITED - RIGHT UPPER QUADRANT

[Series 1: us abdomen limited · 0.20mm/px · 14 of 45 slices shown]
[im 1/45]
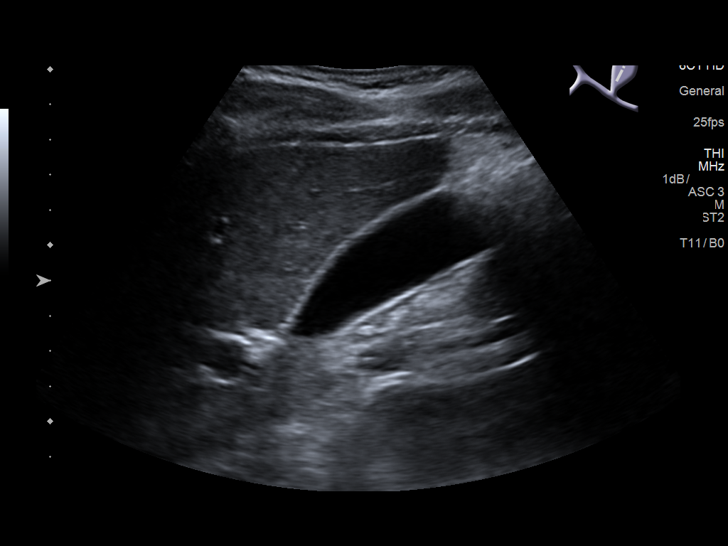
[im 4/45]
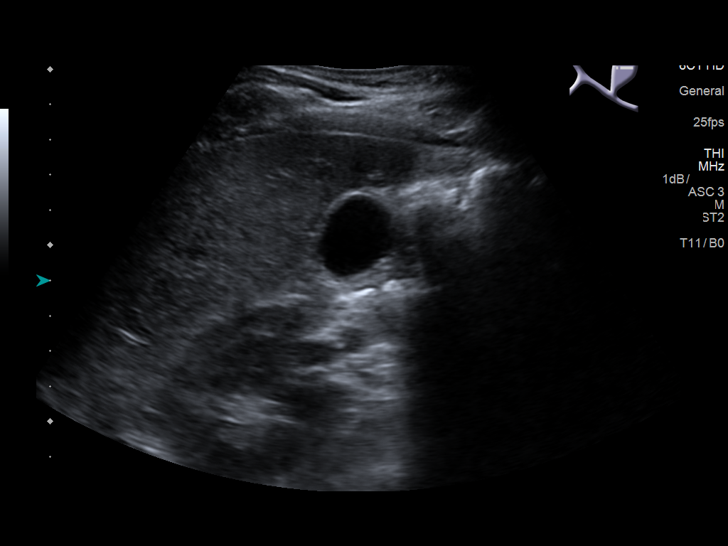
[im 8/45]
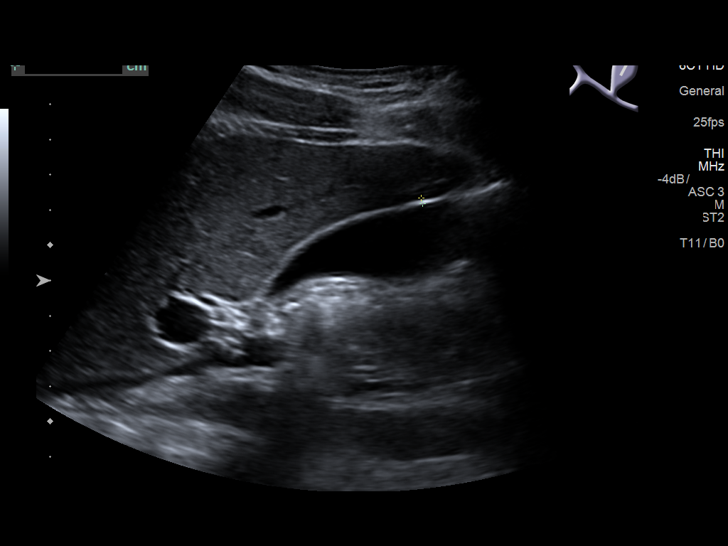
[im 12/45]
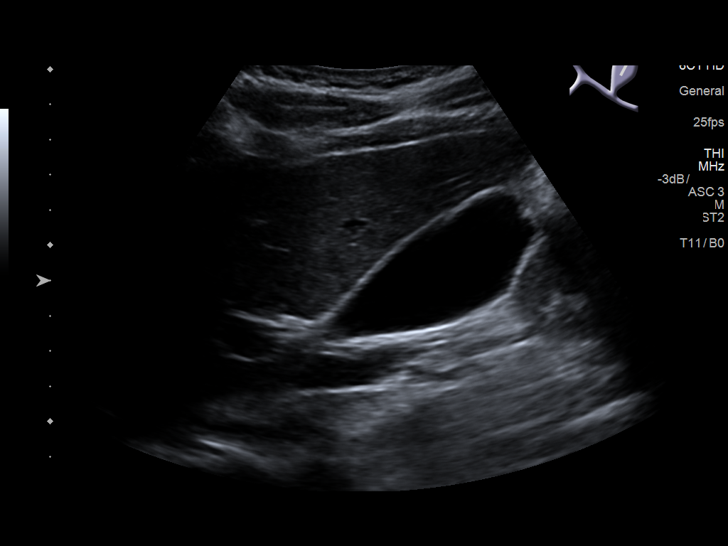
[im 15/45]
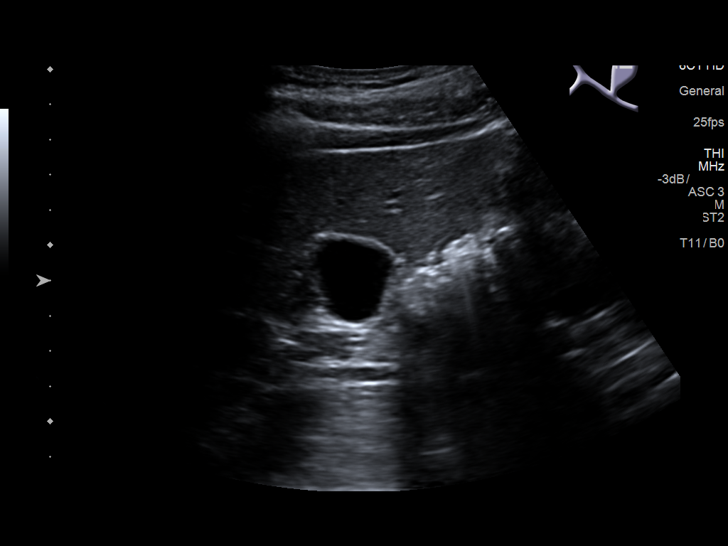
[im 17/45]
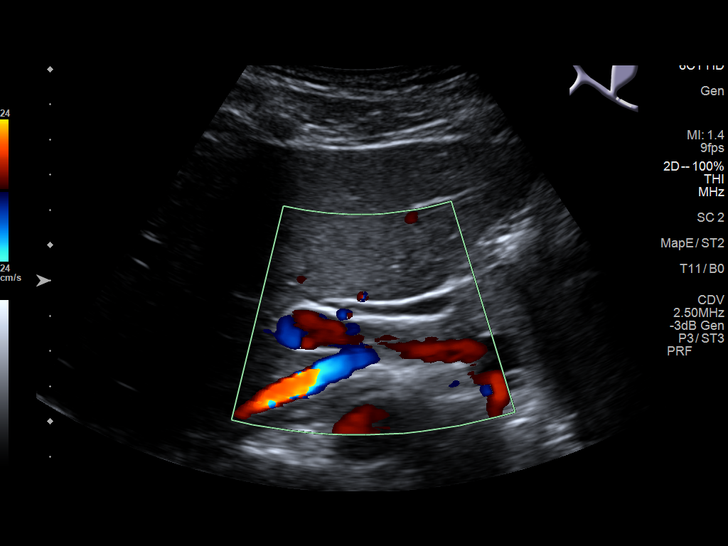
[im 21/45]
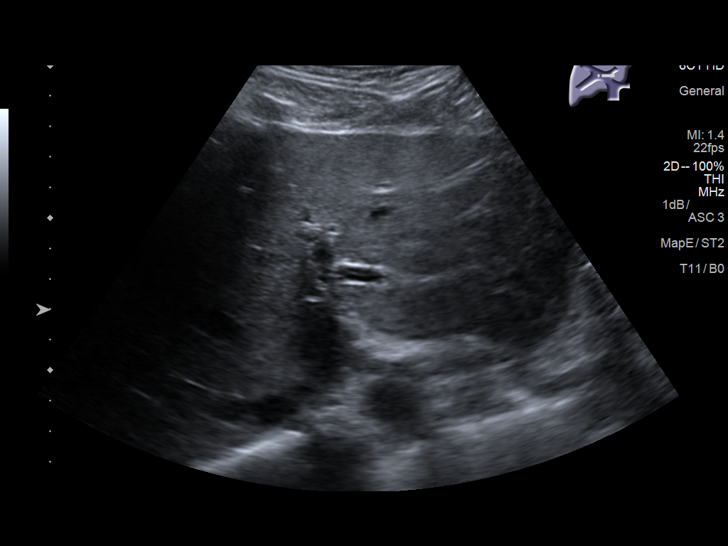
[im 24/45]
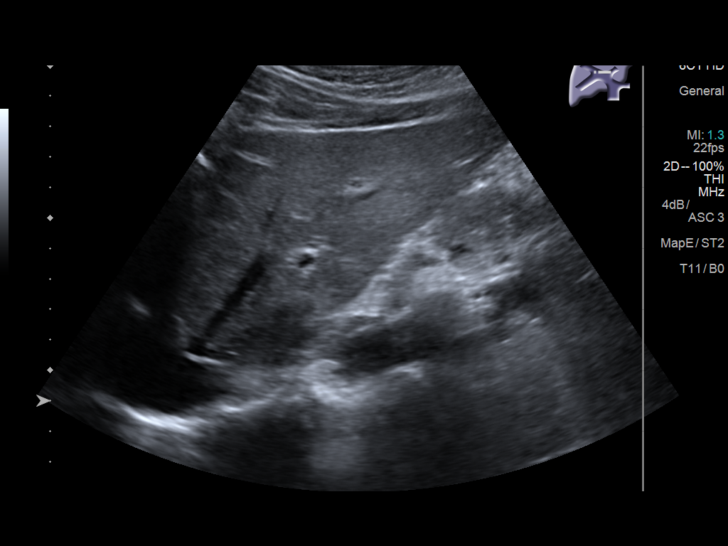
[im 28/45]
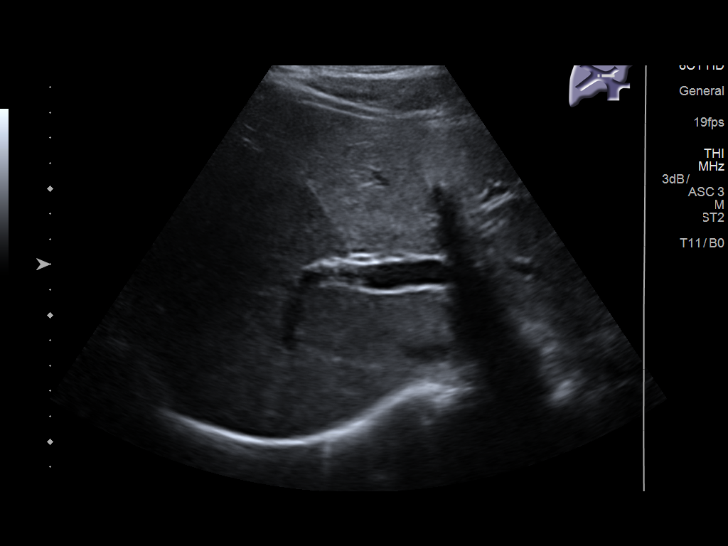
[im 30/45]
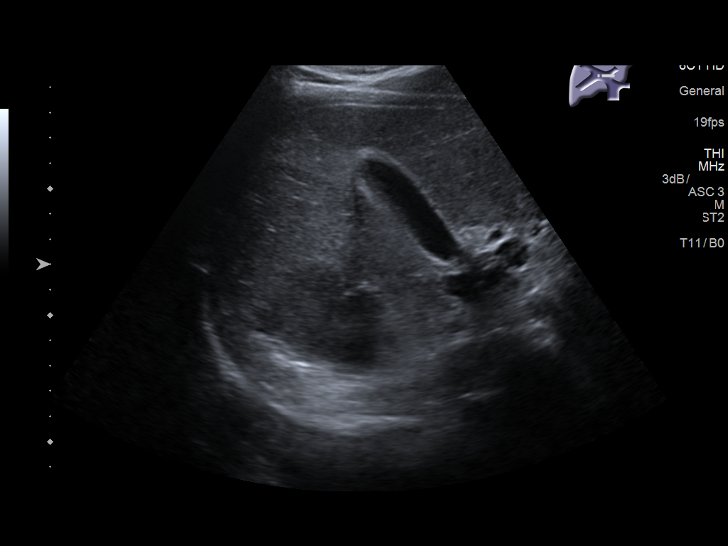
[im 34/45]
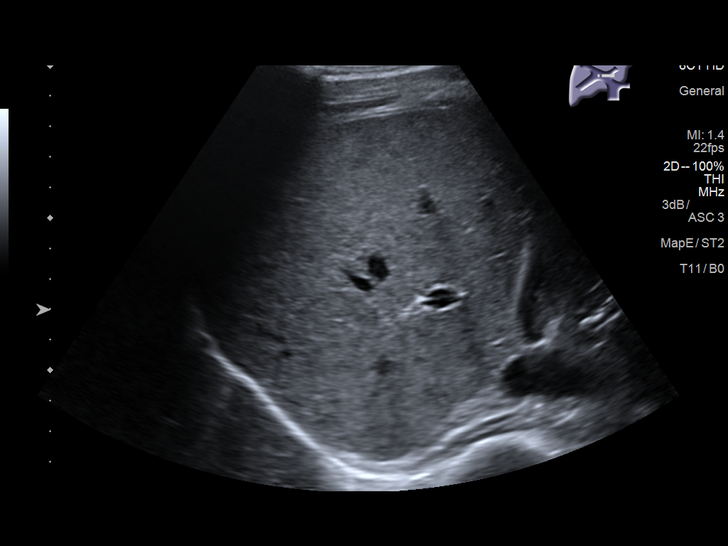
[im 37/45]
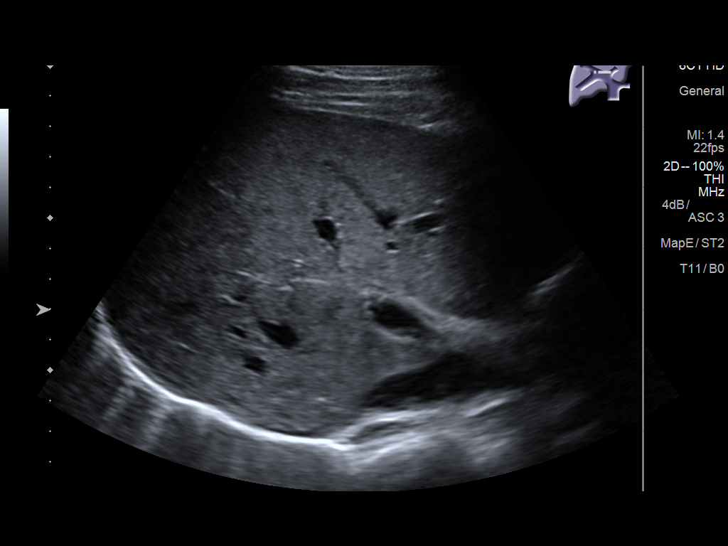
[im 41/45]
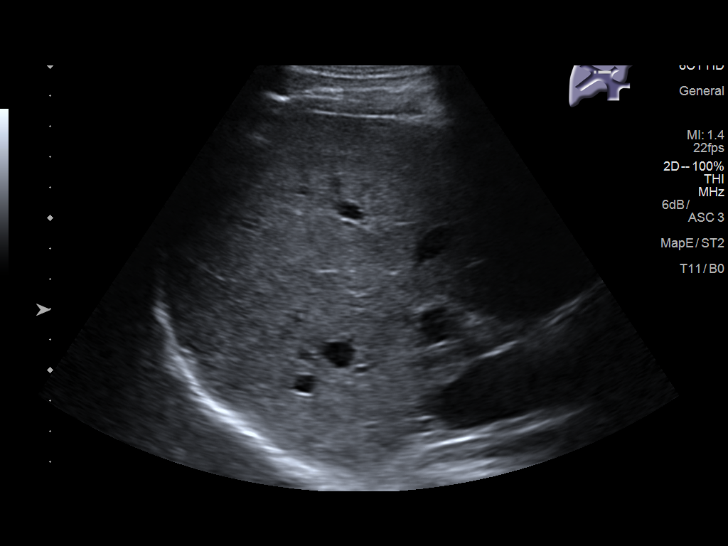
[im 45/45]
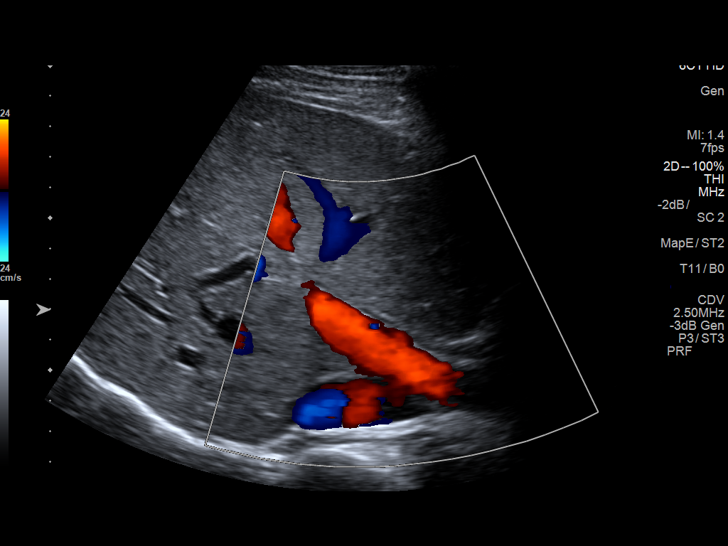

[14 of 25 positions shown; findings below may reference images not displayed]

FINDINGS: Gallbladder:

No gallstones or wall thickening visualized. No sonographic Murphy
sign noted by sonographer.

Common bile duct:

Diameter: 2.5 mm

Liver:

No focal lesion identified. Within normal limits in parenchymal
echogenicity.
IMPRESSION: Negative right upper quadrant abdominal ultrasound

## 2016-03-30 ENCOUNTER — Emergency Department (HOSPITAL_COMMUNITY)
Admission: EM | Admit: 2016-03-30 | Discharge: 2016-03-30 | Disposition: A | Discharge status: Home / Self Care | Payer: Self-pay | Attending: Emergency Medicine | Admitting: Emergency Medicine

## 2016-03-30 ENCOUNTER — Encounter (HOSPITAL_COMMUNITY): Payer: Self-pay | Admitting: Emergency Medicine

## 2016-03-30 DIAGNOSIS — Z5321 Procedure and treatment not carried out due to patient leaving prior to being seen by health care provider: Secondary | ICD-10-CM | POA: Insufficient documentation

## 2016-03-30 DIAGNOSIS — R109 Unspecified abdominal pain: Secondary | ICD-10-CM | POA: Insufficient documentation

## 2016-03-30 DIAGNOSIS — F909 Attention-deficit hyperactivity disorder, unspecified type: Secondary | ICD-10-CM | POA: Insufficient documentation

## 2016-03-30 DIAGNOSIS — F17200 Nicotine dependence, unspecified, uncomplicated: Secondary | ICD-10-CM | POA: Insufficient documentation

## 2016-03-30 DIAGNOSIS — R112 Nausea with vomiting, unspecified: Secondary | ICD-10-CM | POA: Insufficient documentation

## 2016-03-30 LAB — COMPREHENSIVE METABOLIC PANEL
ALT: 28 U/L (ref 17–63)
AST: 20 U/L (ref 15–41)
Albumin: 4.2 g/dL (ref 3.5–5.0)
Alkaline Phosphatase: 58 U/L (ref 38–126)
Anion gap: 7 (ref 5–15)
BUN: 11 mg/dL (ref 6–20)
CO2: 25 mmol/L (ref 22–32)
Calcium: 9.3 mg/dL (ref 8.9–10.3)
Chloride: 107 mmol/L (ref 101–111)
Creatinine, Ser: 0.85 mg/dL (ref 0.61–1.24)
GFR calc Af Amer: >60 mL/min (ref >60)
GFR calc non Af Amer: >60 mL/min (ref >60)
Glucose, Bld: 80 mg/dL (ref 65–99)
Potassium: 4.2 mmol/L (ref 3.5–5.1)
Sodium: 139 mmol/L (ref 135–145)
Total Bilirubin: 0.3 mg/dL (ref 0.3–1.2)
Total Protein: 6.7 g/dL (ref 6.5–8.1)

## 2016-03-30 LAB — CBC
HCT: 41.3 % (ref 39.0–52.0)
Hemoglobin: 14.4 g/dL (ref 13.0–17.0)
MCH: 30.3 pg (ref 26.0–34.0)
MCHC: 34.9 g/dL (ref 30.0–36.0)
MCV: 86.9 fL (ref 78.0–100.0)
Platelets: 244 10*3/uL (ref 150–400)
RBC: 4.75 MIL/uL (ref 4.22–5.81)
RDW: 13 % (ref 11.5–15.5)
WBC: 8.3 10*3/uL (ref 4.0–10.5)

## 2016-03-30 LAB — LIPASE, BLOOD: Lipase: 19 U/L (ref 11–51)

## 2016-03-30 NOTE — ED Notes (Signed)
No answer from pt in waiting room 

## 2016-03-30 NOTE — ED Notes (Signed)
No answer from pt in lobby 

## 2016-03-30 NOTE — ED Triage Notes (Signed)
Pt. Stated, I've had vomiting every time I eat for the last 6 months. Pt. Has not seen a doctor

## 2016-03-30 NOTE — ED Notes (Signed)
Pt called for room placement with no response. 

## 2016-04-02 ENCOUNTER — Emergency Department (HOSPITAL_COMMUNITY)
Admission: EM | Admit: 2016-04-02 | Discharge: 2016-04-02 | Disposition: A | Discharge status: Home / Self Care | Payer: Self-pay | Attending: Emergency Medicine | Admitting: Emergency Medicine

## 2016-04-02 ENCOUNTER — Encounter (HOSPITAL_COMMUNITY): Payer: Self-pay

## 2016-04-02 ENCOUNTER — Emergency Department (HOSPITAL_COMMUNITY): Payer: Self-pay

## 2016-04-02 DIAGNOSIS — F909 Attention-deficit hyperactivity disorder, unspecified type: Secondary | ICD-10-CM | POA: Insufficient documentation

## 2016-04-02 DIAGNOSIS — K2970 Gastritis, unspecified, without bleeding: Secondary | ICD-10-CM | POA: Insufficient documentation

## 2016-04-02 DIAGNOSIS — F17200 Nicotine dependence, unspecified, uncomplicated: Secondary | ICD-10-CM | POA: Insufficient documentation

## 2016-04-02 LAB — URINALYSIS, ROUTINE W REFLEX MICROSCOPIC
Bilirubin Urine: NEGATIVE
Glucose, UA: NEGATIVE mg/dL
Hgb urine dipstick: NEGATIVE
Ketones, ur: NEGATIVE mg/dL
Leukocytes, UA: NEGATIVE
Nitrite: NEGATIVE
Protein, ur: NEGATIVE mg/dL
Specific Gravity, Urine: 1.019 (ref 1.005–1.030)
pH: 5 (ref 5.0–8.0)

## 2016-04-02 LAB — COMPREHENSIVE METABOLIC PANEL
ALT: 29 U/L (ref 17–63)
AST: 19 U/L (ref 15–41)
Albumin: 4.1 g/dL (ref 3.5–5.0)
Alkaline Phosphatase: 63 U/L (ref 38–126)
Anion gap: 10 (ref 5–15)
BUN: 12 mg/dL (ref 6–20)
CO2: 25 mmol/L (ref 22–32)
Calcium: 9.2 mg/dL (ref 8.9–10.3)
Chloride: 103 mmol/L (ref 101–111)
Creatinine, Ser: 1 mg/dL (ref 0.61–1.24)
GFR calc Af Amer: >60 mL/min (ref >60)
GFR calc non Af Amer: >60 mL/min (ref >60)
Glucose, Bld: 92 mg/dL (ref 65–99)
Potassium: 3.8 mmol/L (ref 3.5–5.1)
Sodium: 138 mmol/L (ref 135–145)
Total Bilirubin: 0.3 mg/dL (ref 0.3–1.2)
Total Protein: 7.1 g/dL (ref 6.5–8.1)

## 2016-04-02 LAB — CBC
HCT: 44.1 % (ref 39.0–52.0)
Hemoglobin: 14.9 g/dL (ref 13.0–17.0)
MCH: 30 pg (ref 26.0–34.0)
MCHC: 33.8 g/dL (ref 30.0–36.0)
MCV: 88.7 fL (ref 78.0–100.0)
Platelets: 243 10*3/uL (ref 150–400)
RBC: 4.97 MIL/uL (ref 4.22–5.81)
RDW: 13.8 % (ref 11.5–15.5)
WBC: 7.9 10*3/uL (ref 4.0–10.5)

## 2016-04-02 LAB — LIPASE, BLOOD: Lipase: 20 U/L (ref 11–51)

## 2016-04-02 MED ORDER — SUCRALFATE 1 G PO TABS: 1.0000 g | ORAL_TABLET | Freq: Three times a day (TID) | ORAL | 0 refills | Status: DC

## 2016-04-02 MED ORDER — GI COCKTAIL ~~LOC~~
30.0000 mL | Freq: Once | ORAL | Status: AC
  Administered 2016-04-02: 30 mL via ORAL
  Filled 2016-04-02: qty 30

## 2016-04-02 MED ORDER — RANITIDINE HCL 150 MG PO TABS: 150.0000 mg | ORAL_TABLET | Freq: Two times a day (BID) | ORAL | 0 refills | Status: DC

## 2016-04-02 MED ORDER — FAMOTIDINE 20 MG PO TABS
40.0000 mg | ORAL_TABLET | Freq: Once | ORAL | Status: AC
  Administered 2016-04-02: 40 mg via ORAL
  Filled 2016-04-02: qty 2

## 2016-04-02 NOTE — ED Provider Notes (Signed)
[facility] DEPT Provider Note   CSN: [id number] Arrival date & time: 04/02/16  0141  By signing my name below, I, Oleh Genin, attest that this documentation has been prepared under the direction and in the presence of Orpah Greek, MD. Electronically Signed: Oleh Genin, Scribe. 04/02/16. 2:46 AM.   History   Chief Complaint Chief Complaint  Patient presents with  . Abdominal Pain    HPI Jimmy [name] is a 26 y.o. male who presents to the ED with substernal chest pain and RUQ adominal pain that is ongoing for "months". This patient states that in the last week his chest and abdominal pain has grown constant, worsening following meals and drinking fluid. He has also experienced intermittent, bright-red rectal bleeding over the last several months. He denies any known GI history. He denies any hematemesis. He has no other concerns at this time.  The history is provided by the patient. No language interpreter was used.    Past Medical History:  Diagnosis Date  . ADHD (attention deficit hyperactivity disorder)   . Arthritis   . Migraine   . Scoliosis     Patient Active Problem List   Diagnosis Date Noted  . Opiate dependence ([place]) 08/02/2013    Past Surgical History:  Procedure Laterality Date  . ESOPHAGUS SURGERY         Home Medications    Prior to Admission medications   Medication Sig Start Date End Date Taking? Authorizing Provider  QUEtiapine (SEROQUEL) 100 MG tablet Take 100 mg by mouth at bedtime.   Yes Historical Provider, MD    Family History History reviewed. No pertinent family history.  Social History Social History  Substance Use Topics  . Smoking status: Current Every Day Smoker  . Smokeless tobacco: Current User  . Alcohol use No     Allergies   Depakote [divalproex sodium]; Flexeril [cyclobenzaprine]; Naproxen; Ritalin [methylphenidate hcl]; and Tramadol   Review of Systems Review of Systems  Cardiovascular:  Positive for chest pain.  Gastrointestinal: Positive for abdominal pain and blood in stool. Negative for diarrhea, nausea and vomiting.       No hematemesis  All other systems reviewed and are negative.    Physical Exam Updated Vital Signs BP 122/64   Pulse 72   Temp 97.6 F (36.4 C) (Oral)   Resp 16   SpO2 98%   Physical Exam  Constitutional: He is oriented to person, place, and time. He appears well-developed and well-nourished. No distress.  HENT:  Head: Normocephalic and atraumatic.  Right Ear: Hearing normal.  Left Ear: Hearing normal.  Nose: Nose normal.  Mouth/Throat: Oropharynx is clear and moist and mucous membranes are normal.  Eyes: Conjunctivae and EOM are normal. Pupils are equal, round, and reactive to light.  Neck: Normal range of motion. Neck supple.  Cardiovascular: Regular rhythm, S1 normal and S2 normal.  Exam reveals no gallop and no friction rub.   No murmur heard. Pulmonary/Chest: Effort normal and breath sounds normal. No respiratory distress. He exhibits no tenderness.  Abdominal: Soft. Normal appearance and bowel sounds are normal. There is no hepatosplenomegaly. There is tenderness in the right upper quadrant and epigastric area. There is no rebound, no guarding, no tenderness at McBurney's point and negative Murphy's sign. No hernia.  Musculoskeletal: Normal range of motion.  Neurological: He is alert and oriented to person, place, and time. He has normal strength. No cranial nerve deficit or sensory deficit. Coordination normal. GCS eye subscore is 4. GCS verbal  subscore is 5. GCS motor subscore is 6.  Skin: Skin is warm, dry and intact. No rash noted. No cyanosis.  Psychiatric: He has a normal mood and affect. His speech is normal and behavior is normal. Thought content normal.  Nursing note and vitals reviewed.    ED Treatments / Results  DIAGNOSTIC STUDIES: Oxygen Saturation is 100 percent on room air which is normal by my interpretation.     COORDINATION OF CARE: 2:46 AM Discussed treatment plan with pt at bedside and pt agreed to plan.   Labs (all labs ordered are listed, but only abnormal results are displayed) Labs Reviewed  LIPASE, BLOOD  COMPREHENSIVE METABOLIC PANEL  CBC  URINALYSIS, ROUTINE W REFLEX MICROSCOPIC    EKG  EKG Interpretation None       Radiology US Abdomen Limited Ruq  Result Date: 04/02/2016 CLINICAL DATA:  Right upper quadrant pain EXAM: US ABDOMEN LIMITED - RIGHT UPPER QUADRANT COMPARISON:  None. FINDINGS: Gallbladder: No gallstones or wall thickening visualized. No sonographic Murphy sign noted by sonographer. Common bile duct: Diameter: 2.5 mm Liver: No focal lesion identified. Within normal limits in parenchymal echogenicity. IMPRESSION: Negative right upper quadrant abdominal ultrasound Electronically Signed   By: Donavan Foil M.D.   On: 04/02/2016 03:40    Procedures Procedures (including critical care time)  Medications Ordered in ED Medications  gi cocktail (Maalox,Lidocaine,Donnatal) (not administered)  famotidine (PEPCID) tablet 40 mg (not administered)     Initial Impression / Assessment and Plan / ED Course  I have reviewed the triage vital signs and the nursing notes.  Pertinent labs & imaging results that were available during my care of the patient were reviewed by me and considered in my medical decision making (see chart for details).  Clinical Course    Patient presents to the emergency department for evaluation of abdominal pain. Patient reports that he has been having symptoms for months, but in the last few days pain has become continuous. He notices worsening of the pain when he eats or drinks. He does report nausea and vomiting after eating as well. 69 patient did reveal mild tenderness in the right upper quadrant and epigastric region. Lab work was normal. Ultrasound of gallbladder was normal. Symptoms most consistent with reflux, possible peptic ulcer disease.  Will treat with antacids, Carafate, follow-up with GI.  Final Clinical Impressions(s) / ED Diagnoses   Final diagnoses:  Gastritis without bleeding, unspecified chronicity, unspecified gastritis type    New Prescriptions New Prescriptions   No medications on file  I personally performed the services described in this documentation, which was scribed in my presence. The recorded information has been reviewed and is accurate.    Orpah Greek, MD 04/02/16 [phone]

## 2016-04-02 NOTE — ED Notes (Signed)
Patient transported to Ultrasound 

## 2016-04-02 NOTE — ED Triage Notes (Signed)
Pt complaining of R sided abdominal pain. Pt states pain increases with eating food. Pt states nausea/vomiting x 4 days.

## 2016-04-29 ENCOUNTER — Encounter (HOSPITAL_COMMUNITY): Payer: Self-pay | Admitting: Emergency Medicine

## 2016-04-29 ENCOUNTER — Emergency Department (HOSPITAL_COMMUNITY)
Admission: EM | Admit: 2016-04-29 | Discharge: 2016-04-29 | Disposition: A | Discharge status: Home / Self Care | Payer: Self-pay | Attending: Emergency Medicine | Admitting: Emergency Medicine

## 2016-04-29 DIAGNOSIS — Z79899 Other long term (current) drug therapy: Secondary | ICD-10-CM | POA: Insufficient documentation

## 2016-04-29 DIAGNOSIS — F17200 Nicotine dependence, unspecified, uncomplicated: Secondary | ICD-10-CM | POA: Insufficient documentation

## 2016-04-29 DIAGNOSIS — R1013 Epigastric pain: Secondary | ICD-10-CM | POA: Insufficient documentation

## 2016-04-29 DIAGNOSIS — F909 Attention-deficit hyperactivity disorder, unspecified type: Secondary | ICD-10-CM | POA: Insufficient documentation

## 2016-04-29 DIAGNOSIS — B349 Viral infection, unspecified: Secondary | ICD-10-CM | POA: Insufficient documentation

## 2016-04-29 LAB — I-STAT CHEM 8, ED
BUN: 10 mg/dL (ref 6–20)
Calcium, Ion: 1.08 mmol/L — ABNORMAL LOW (ref 1.15–1.40)
Chloride: 106 mmol/L (ref 101–111)
Creatinine, Ser: 0.7 mg/dL (ref 0.61–1.24)
Glucose, Bld: 94 mg/dL (ref 65–99)
HCT: 41 % (ref 39.0–52.0)
Hemoglobin: 13.9 g/dL (ref 13.0–17.0)
Potassium: 3.8 mmol/L (ref 3.5–5.1)
Sodium: 141 mmol/L (ref 135–145)
TCO2: 23 mmol/L (ref 0–100)

## 2016-04-29 MED ORDER — ONDANSETRON 4 MG PO TBDP: ORAL_TABLET | ORAL | 0 refills | Status: DC

## 2016-04-29 MED ORDER — SODIUM CHLORIDE 0.9 % IV BOLUS (SEPSIS)
1000.0000 mL | Freq: Once | INTRAVENOUS | Status: AC
  Administered 2016-04-29: 1000 mL via INTRAVENOUS

## 2016-04-29 MED ORDER — KETOROLAC TROMETHAMINE 30 MG/ML IJ SOLN
30.0000 mg | Freq: Once | INTRAMUSCULAR | Status: AC
  Administered 2016-04-29: 30 mg via INTRAVENOUS
  Filled 2016-04-29: qty 1

## 2016-04-29 MED ORDER — PANTOPRAZOLE SODIUM 40 MG IV SOLR
40.0000 mg | Freq: Once | INTRAVENOUS | Status: AC
  Administered 2016-04-29: 40 mg via INTRAVENOUS
  Filled 2016-04-29: qty 40

## 2016-04-29 MED ORDER — HYDROCODONE-ACETAMINOPHEN 5-325 MG PO TABS: 1.0000 | ORAL_TABLET | Freq: Four times a day (QID) | ORAL | 0 refills | Status: DC | PRN

## 2016-04-29 MED ORDER — ONDANSETRON HCL 4 MG/2ML IJ SOLN
4.0000 mg | Freq: Once | INTRAMUSCULAR | Status: AC
  Administered 2016-04-29: 4 mg via INTRAVENOUS
  Filled 2016-04-29: qty 2

## 2016-04-29 MED FILL — HYDROCODON-APAP 5-325: 5-325 | 3 days supply | Qty: 10 | Fill #0

## 2016-04-29 MED FILL — ONDANSETRON ODT 4 MG TABLET: 4 | 3 days supply | Qty: 10 | Fill #0

## 2016-04-29 NOTE — Discharge Instructions (Signed)
Drink plenty of fluids. Take Imodium for diarrhea follow-up if not getting any better. Rest at home today and tomorrow

## 2016-04-29 NOTE — ED Provider Notes (Signed)
[facility] DEPT Provider Note   CSN: [id number] Arrival date & time: 04/29/16  [id number]  By signing my name below, I, Neta Mends, attest that this documentation has been prepared under the direction and in the presence of Milton Ferguson, MD . Electronically Signed: Neta Mends, ED Scribe. 04/29/2016. 12:19 PM.    History   Chief Complaint Chief Complaint  Patient presents with  . Generalized Body Aches    The history is provided by the patient. No language interpreter was used.  Diarrhea   This is a new problem. The current episode started 6 to 12 hours ago. The problem occurs 2 to 4 times per day. The problem has not changed since onset.There has been no fever. Associated symptoms include abdominal pain, vomiting, chills and sweats. Pertinent negatives include no headaches and no cough. He has tried nothing for the symptoms. The treatment provided no relief.   HPI Comments:  Jimmy [name] is a 26 y.o. male who presents to the Emergency Department complaining of multiple episode of vomiting and diarrhea since 0300 this AM. Pt states that this AM at 0300 he woke up and was vomiting and had diarrhea, and then woke up again to go to work at 0600 and had associated chills, diaphoresis, dizziness, abdominal pain. Pt states that he has had several episodes of diarrhea since 0600. Pt does not take any medications regularly. No alleviating factors noted. Pt denies other associated symptoms.   Past Medical History:  Diagnosis Date  . ADHD (attention deficit hyperactivity disorder)   . Arthritis   . Migraine   . Scoliosis     Patient Active Problem List   Diagnosis Date Noted  . Opiate dependence ([place]) 08/02/2013    Past Surgical History:  Procedure Laterality Date  . ESOPHAGUS SURGERY         Home Medications    Prior to Admission medications   Medication Sig Start Date End Date Taking? Authorizing Provider  QUEtiapine (SEROQUEL) 100 MG tablet Take 100 mg by  mouth at bedtime.    Historical Provider, MD  ranitidine (ZANTAC) 150 MG tablet Take 1 tablet (150 mg total) by mouth 2 (two) times daily. 04/02/16   Orpah Greek, MD  sucralfate (CARAFATE) 1 g tablet Take 1 tablet (1 g total) by mouth 4 (four) times daily -  with meals and at bedtime. 04/02/16   Orpah Greek, MD    Family History History reviewed. No pertinent family history.  Social History Social History  Substance Use Topics  . Smoking status: Current Every Day Smoker  . Smokeless tobacco: Current User  . Alcohol use No     Allergies   Depakote [divalproex sodium]; Flexeril [cyclobenzaprine]; Naproxen; Ritalin [methylphenidate hcl]; and Tramadol   Review of Systems Review of Systems  Constitutional: Positive for chills and diaphoresis. Negative for appetite change and fatigue.  HENT: Negative for congestion, ear discharge and sinus pressure.   Eyes: Negative for discharge.  Respiratory: Negative for cough.   Cardiovascular: Negative for chest pain.  Gastrointestinal: Positive for abdominal pain, diarrhea, nausea and vomiting.  Genitourinary: Negative for frequency and hematuria.  Musculoskeletal: Negative for back pain.  Skin: Negative for rash.  Neurological: Positive for dizziness and weakness. Negative for seizures and headaches.  Psychiatric/Behavioral: Negative for hallucinations.     Physical Exam Updated Vital Signs BP 133/75 (BP Location: Right Arm)   Pulse 84   Temp 98.7 F (37.1 C) (Oral)   Resp 18   SpO2 99%  Physical Exam  Constitutional: He is oriented to person, place, and time. He appears well-developed.  HENT:  Head: Normocephalic.  Dry mucous membranes.  Eyes: Conjunctivae and EOM are normal. No scleral icterus.  Neck: Neck supple. No thyromegaly present.  Cardiovascular: Normal rate and regular rhythm.  Exam reveals no gallop and no friction rub.   No murmur heard. Pulmonary/Chest: No stridor. He has no wheezes. He has no  rales. He exhibits no tenderness.  Abdominal: He exhibits no distension. There is no tenderness. There is no rebound.  Minimal epigastric tenderness  Musculoskeletal: Normal range of motion. He exhibits no edema.  Lymphadenopathy:    He has no cervical adenopathy.  Neurological: He is oriented to person, place, and time. He exhibits normal muscle tone. Coordination normal.  Skin: No rash noted. No erythema.  Psychiatric: He has a normal mood and affect. His behavior is normal.     ED Treatments / Results  DIAGNOSTIC STUDIES:  Oxygen Saturation is 99% on RA, normal by my interpretation.    COORDINATION OF CARE:  12:19 PM Discussed treatment plan with pt at bedside and pt agreed to plan.   Labs (all labs ordered are listed, but only abnormal results are displayed) Labs Reviewed - No data to display  EKG  EKG Interpretation None       Radiology No results found.  Procedures Procedures (including critical care time)  Medications Ordered in ED Medications - No data to display   Initial Impression / Assessment and Plan / ED Course  I have reviewed the triage vital signs and the nursing notes.  Pertinent labs & imaging results that were available during my care of the patient were reviewed by me and considered in my medical decision making (see chart for details).     Patient with gastroenteritis. Patient improved with IV fluids and Zofran and Toradol. He will be sent home with Zofran and Vicodin and told to drink plenty of fluids and follow-up as needed    Final Clinical Impressions(s) / ED Diagnoses   Final diagnoses:  None    New Prescriptions New Prescriptions   No medications on file  The chart was scribed for me under my direct supervision.  I personally performed the history, physical, and medical decision making and all procedures in the evaluation of this patient.Milton Ferguson, MD 04/29/16 1357

## 2016-04-29 NOTE — ED Triage Notes (Signed)
Pt sts body aches with nausea x 3 days

## 2018-03-03 ENCOUNTER — Encounter (HOSPITAL_COMMUNITY): Payer: Self-pay

## 2018-03-03 ENCOUNTER — Emergency Department (HOSPITAL_COMMUNITY)
Admission: EM | Admit: 2018-03-03 | Discharge: 2018-03-03 | Disposition: A | Discharge status: Home / Self Care | Payer: Self-pay | Attending: Emergency Medicine | Admitting: Emergency Medicine

## 2018-03-03 DIAGNOSIS — Z79899 Other long term (current) drug therapy: Secondary | ICD-10-CM | POA: Insufficient documentation

## 2018-03-03 DIAGNOSIS — F909 Attention-deficit hyperactivity disorder, unspecified type: Secondary | ICD-10-CM | POA: Insufficient documentation

## 2018-03-03 DIAGNOSIS — F17210 Nicotine dependence, cigarettes, uncomplicated: Secondary | ICD-10-CM | POA: Insufficient documentation

## 2018-03-03 DIAGNOSIS — K439 Ventral hernia without obstruction or gangrene: Secondary | ICD-10-CM | POA: Insufficient documentation

## 2018-03-03 LAB — CBC
HCT: 43.2 % (ref 39.0–52.0)
Hemoglobin: 13.8 g/dL (ref 13.0–17.0)
MCH: 28.5 pg (ref 26.0–34.0)
MCHC: 31.9 g/dL (ref 30.0–36.0)
MCV: 89.1 fL (ref 80.0–100.0)
Platelets: 291 10*3/uL (ref 150–400)
RBC: 4.85 MIL/uL (ref 4.22–5.81)
RDW: 12.8 % (ref 11.5–15.5)
WBC: 8.1 10*3/uL (ref 4.0–10.5)
nRBC: 0 % (ref 0.0–0.2)

## 2018-03-03 LAB — COMPREHENSIVE METABOLIC PANEL
ALT: 17 U/L (ref 0–44)
AST: 16 U/L (ref 15–41)
Albumin: 4.1 g/dL (ref 3.5–5.0)
Alkaline Phosphatase: 74 U/L (ref 38–126)
Anion gap: 10 (ref 5–15)
BUN: 9 mg/dL (ref 6–20)
CO2: 23 mmol/L (ref 22–32)
Calcium: 9.3 mg/dL (ref 8.9–10.3)
Chloride: 108 mmol/L (ref 98–111)
Creatinine, Ser: 1.1 mg/dL (ref 0.61–1.24)
GFR calc Af Amer: >60 mL/min (ref >60)
GFR calc non Af Amer: >60 mL/min (ref >60)
Glucose, Bld: 118 mg/dL — ABNORMAL HIGH (ref 70–99)
Potassium: 3.8 mmol/L (ref 3.5–5.1)
Sodium: 141 mmol/L (ref 135–145)
Total Bilirubin: 0.6 mg/dL (ref 0.3–1.2)
Total Protein: 6.5 g/dL (ref 6.5–8.1)

## 2018-03-03 LAB — LIPASE, BLOOD: Lipase: 23 U/L (ref 11–51)

## 2018-03-03 NOTE — ED Triage Notes (Signed)
Pt states he has an abd hernia that has been there for the past 3 days.

## 2018-03-03 NOTE — ED Provider Notes (Signed)
[facility] EMERGENCY DEPARTMENT Provider Note   CSN: [id number] Arrival date & time: 03/03/18  1236     History   Chief Complaint Chief Complaint  Patient presents with  . Hernia    HPI Jimmy [name] is a 27 y.o. male.  HPI   27 year old male presents today with complaints of hernia.  She notes a past surgical history of esophageal surgery of unknown etiology when he was a child.  He has a scar in his right upper abdomen from this.  He notes that over the last several days he has had pain and a nodule underneath the scar he also has pain up in the epigastric region with some minor indigestion.  He reports that when he eats it takes time for the food to go down into his stomach.  He denies any associating nausea or vomiting, denies any lower abdominal pain or fever, reports passing gas and having bowel movements.  No urinary complaints.  He was seen last night at [facility] in [place] where he had thorough work-up including laboratory analysis plain films and CT scan.  His CT noted a fat-containing ventral hernia with no other acute complications.  Patient notes he continues to have pain today and was concerned that he was diagnosed with hernia and is still having pain.  He denies any significant changes overnight.  He was referred to general surgery and gastroenterology.      Past Medical History:  Diagnosis Date  . ADHD (attention deficit hyperactivity disorder)   . Arthritis   . Migraine   . Scoliosis     Patient Active Problem List   Diagnosis Date Noted  . Opiate dependence ([place]) 08/02/2013    Past Surgical History:  Procedure Laterality Date  . ESOPHAGUS SURGERY          Home Medications    Prior to Admission medications   Medication Sig Start Date End Date Taking? Authorizing Provider  HYDROcodone-acetaminophen (NORCO/VICODIN) 5-325 MG tablet Take 1 tablet by mouth every 6 (six) hours as needed for moderate pain. 04/29/16   Milton Ferguson, MD  ondansetron (ZOFRAN ODT) 4 MG disintegrating tablet 4mg  ODT q4 hours prn nausea/vomit 04/29/16   Milton Ferguson, MD  QUEtiapine (SEROQUEL) 100 MG tablet Take 100 mg by mouth at bedtime.    [provider]  ranitidine (ZANTAC) 150 MG tablet Take 1 tablet (150 mg total) by mouth 2 (two) times daily. 04/02/16   Orpah Greek, MD  sucralfate (CARAFATE) 1 g tablet Take 1 tablet (1 g total) by mouth 4 (four) times daily -  with meals and at bedtime. 04/02/16   Orpah Greek, MD    Family History No family history on file.  Social History Social History   Tobacco Use  . Smoking status: Current Every Day Smoker  . Smokeless tobacco: Current User  Substance Use Topics  . Alcohol use: No  . Drug use: Yes    Comment: Opioids     Allergies   Depakote [divalproex sodium]; Flexeril [cyclobenzaprine]; Naproxen; Ritalin [methylphenidate hcl]; and Tramadol   Review of Systems Review of Systems  All other systems reviewed and are negative.   Physical Exam Updated Vital Signs BP 119/71   Pulse 85   Temp 98 F (36.7 C) (Oral)   Resp 20   Ht 5\' 4"  (1.626 m)   Wt 75.3 kg   SpO2 98%   BMI 28.49 kg/m   Physical Exam Vitals signs and nursing note  reviewed.  Constitutional:      Appearance: He is well-developed.  HENT:     Head: Normocephalic and atraumatic.  Eyes:     General: No scleral icterus.       Right eye: No discharge.        Left eye: No discharge.     Conjunctiva/sclera: Conjunctivae normal.     Pupils: Pupils are equal, round, and reactive to light.  Neck:     Musculoskeletal: Normal range of motion.     Vascular: No JVD.     Trachea: No tracheal deviation.  Pulmonary:     Effort: Pulmonary effort is normal.     Breath sounds: No stridor.  Abdominal:     Comments: Horizontal incision scar right upper abdomen with firmness noted below, no erythema or warmth, no bowel loops felt remainder abdomen soft nontender  Neurological:      Mental Status: He is alert and oriented to person, place, and time.     Coordination: Coordination normal.  Psychiatric:        Behavior: Behavior normal.        Thought Content: Thought content normal.        Judgment: Judgment normal.      ED Treatments / Results  Labs (all labs ordered are listed, but only abnormal results are displayed) Labs Reviewed  COMPREHENSIVE METABOLIC PANEL - Abnormal; Notable for the following components:      Result Value   Glucose, Bld 118 (*)    All other components within normal limits  LIPASE, BLOOD  CBC  URINALYSIS, ROUTINE W REFLEX MICROSCOPIC    EKG None  Radiology No results found.  Procedures Procedures (including critical care time)  Medications Ordered in ED Medications - No data to display   Initial Impression / Assessment and Plan / ED Course  I have reviewed the triage vital signs and the nursing notes.  Pertinent labs & imaging results that were available during my care of the patient were reviewed by me and considered in my medical decision making (see chart for details).     27 year old male presents today with fat-containing ventral hernia.  He has no signs of complicating features.  He had labs and imaging performed last night it was reassuring.  He has no significant changes overnight, passing gas and stool no nausea or vomiting.  Patient is stable for outpatient management with general surgery and gastroenterology.  He is given strict return precautions.  He verbalized understanding and agreement to today's plan had no further questions or concerns.  Final Clinical Impressions(s) / ED Diagnoses   Final diagnoses:  Ventral hernia without obstruction or gangrene    ED Discharge Orders    None       Francee Gentile 03/03/18 1437    Carmin Muskrat, MD 03/05/18 2356

## 2018-03-03 NOTE — ED Notes (Signed)
Pt also reports diarrhea for the past 3 days as well

## 2018-03-03 NOTE — Discharge Instructions (Addendum)
Please read the attached information.  You have been referred to both gastroenterology and surgery by [facility] I do recommend you follow-up with these providers as recommended.  If you develop any new or worsening signs or symptoms please return immediately to the emergency room.

## 2018-03-03 NOTE — ED Notes (Signed)
Patient verbalizes understanding of discharge instructions. Opportunity for questioning and answers were provided. Armband removed by staff, pt discharged from ED. Pt ambulatory to lobby.  

## 2018-03-03 NOTE — ED Notes (Signed)
Pt going to get wallet from car.

## 2018-03-07 ENCOUNTER — Other Ambulatory Visit: Payer: Self-pay

## 2018-03-07 ENCOUNTER — Encounter (HOSPITAL_COMMUNITY): Payer: Self-pay | Admitting: Emergency Medicine

## 2018-03-07 ENCOUNTER — Emergency Department (HOSPITAL_COMMUNITY)
Admission: EM | Admit: 2018-03-07 | Discharge: 2018-03-07 | Disposition: A | Discharge status: Home / Self Care | Payer: Self-pay | Attending: Emergency Medicine | Admitting: Emergency Medicine

## 2018-03-07 DIAGNOSIS — R109 Unspecified abdominal pain: Secondary | ICD-10-CM | POA: Insufficient documentation

## 2018-03-07 DIAGNOSIS — Z5321 Procedure and treatment not carried out due to patient leaving prior to being seen by health care provider: Secondary | ICD-10-CM | POA: Insufficient documentation

## 2018-03-07 NOTE — ED Triage Notes (Signed)
Pt reports abd pain increased since yesterday, epigastric radiating to RUQ.  Pt reports chills, denies fever.  Pt c/o nausea denies vomiting.

## 2018-03-07 NOTE — ED Notes (Signed)
Pt came out of room states "I guess you guys can just take me out"
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
## 2018-03-17 ENCOUNTER — Ambulatory Visit (INDEPENDENT_AMBULATORY_CARE_PROVIDER_SITE_OTHER): Payer: Self-pay | Admitting: Family Medicine

## 2018-03-17 ENCOUNTER — Encounter: Payer: Self-pay | Admitting: Family Medicine

## 2018-03-17 VITALS — BP 122/76 | HR 73 | Temp 97.6°F | Resp 17 | Ht 64.0 in | Wt 157.2 lb

## 2018-03-17 DIAGNOSIS — Z7689 Persons encountering health services in other specified circumstances: Secondary | ICD-10-CM

## 2018-03-17 DIAGNOSIS — K439 Ventral hernia without obstruction or gangrene: Secondary | ICD-10-CM

## 2018-03-17 MED ORDER — OMEPRAZOLE 40 MG PO CPDR: 40.0000 mg | DELAYED_RELEASE_CAPSULE | Freq: Every day | ORAL | 1 refills | Status: DC

## 2018-03-17 MED ORDER — DOCUSATE SODIUM 50 MG PO CAPS: 50.0000 mg | ORAL_CAPSULE | Freq: Two times a day (BID) | ORAL | 0 refills | Status: DC

## 2018-03-17 NOTE — Progress Notes (Signed)
Jimmy [name], is a 27 y.o. male  WGY:[id number]  XBL:[id number]  DOB - 1990-10-11  CC:  Chief Complaint  Patient presents with  . Establish Care  . Follow-up    ED 12/17: ventral hernia. feels bloated & like food is getting stuck. having sharp stabbing abdominal pain       HPI: Jimmy [name] is a 27 y.o. male is here today to establish care and ER follow-up. Patient presented to [facility] for evaluation of ventral hernia in which he was originally diagnosed with while presenting to the ER at [facility] 03/02/2018. ER provided noted reviewing CT performed at [facility] which was significant for non-obstructed, non-gangrenous, hernia. Patient was referred from [facility] ER to establish care and follow-up. He continue complain of non-specific abdominal pain. Reports "pain all over". Pain is sharp and constant. Complains of pain is 10/10. He was referred to gastroenterology within the [facility] system in [place] and has not contacted their office to schedule a follow-up. He is uninsured. He complains of constipation and sensation food is stuck in mid-epigastric region. He admits to poor water intake. Reports with efforts to have bowel movements, abdominal pain worsens. Denies nausea or vomiting. Has not taken any medication for pain.   JOVONTA LEVIT has Opiate dependence ([place]) on their problem list.   Current medications: Current Outpatient Medications:  .  QUEtiapine (SEROQUEL) 100 MG tablet, Take 100 mg by mouth at bedtime., Disp: , Rfl:    Pertinent family medical history: family history includes Cancer in his maternal grandfather and maternal grandmother; Diabetes in his maternal aunt, maternal grandmother, and mother.   Allergies  Allergen Reactions  . Cyclobenzaprine Nausea Only and Hives  . Depakote [Divalproex Sodium] Other (See Comments)    "made him crazy"  . Naproxen Nausea Only    Acid reflux  . Ritalin [Methylphenidate Hcl] Other (See Comments)    Increased hyper activeness   .  Tramadol Other (See Comments)    "acid"    Social History   Socioeconomic History  . Marital status: Married    Spouse name: Not on file  . Number of children: Not on file  . Years of education: Not on file  . Highest education level: Not on file  Occupational History  . Not on file  Social Needs  . Financial resource strain: Not on file  . Food insecurity:    Worry: Not on file    Inability: Not on file  . Transportation needs:    Medical: Not on file    Non-medical: Not on file  Tobacco Use  . Smoking status: Current Every Day Smoker    Packs/day: 1.00  . Smokeless tobacco: Current User  Substance and Sexual Activity  . Alcohol use: No  . Drug use: Not Currently    Comment: Opioids  . Sexual activity: Yes  Lifestyle  . Physical activity:    Days per week: Not on file    Minutes per session: Not on file  . Stress: Not on file  Relationships  . Social connections:    Talks on phone: Not on file    Gets together: Not on file    Attends religious service: Not on file    Active member of club or organization: Not on file    Attends meetings of clubs or organizations: Not on file    Relationship status: Not on file  . Intimate partner violence:    Fear of current or ex partner: Not on file  Emotionally abused: Not on file    Physically abused: Not on file    Forced sexual activity: Not on file  Other Topics Concern  . Not on file  Social History Narrative  . Not on file    Review of Systems: See HPI   Objective:   Vitals:   03/17/18 1014  BP: 122/76  Pulse: 73  Resp: 17  Temp: 97.6 F (36.4 C)  SpO2: 97%    BP Readings from Last 3 Encounters:  03/17/18 122/76  03/07/18 124/70  03/03/18 114/86    Filed Weights   03/17/18 1014  Weight: 157 lb 3.2 oz (71.3 kg)      Physical Exam: Constitutional: Patient appears well-developed and well-nourished. No distress. HENT: Normocephalic, atraumatic, External right and left ear normal. Oropharynx  is clear and moist.  Eyes: Conjunctivae and EOM are normal. PERRLA, no scleral icterus. Neck: Normal ROM. Neck supple. No JVD. No tracheal deviation. No thyromegaly. CVS: RRR, S1/S2 +, no murmurs, no gallops, no carotid bruit.  Pulmonary: Effort and breath sounds normal, no stridor, rhonchi, wheezes, rales.  Abdominal: Soft. BS +, no distension, generalized tenderness x 4. No palpable mass during exam.  Musculoskeletal: Normal range of motion. No edema and no tenderness.  Neuro: Alert. Normal muscle tone coordination. Normal gait. BUE and BLE strength 5/5. Bilateral hand grips symmetrical. No cranial nerve deficit. Skin: Skin is warm and dry. No rash noted. Not diaphoretic. No erythema. No pallor. Psychiatric: Normal mood and affect. Behavior, judgment, thought content normal.  Lab Results (prior encounters)  Lab Results  Component Value Date   WBC 8.1 03/03/2018   HGB 13.8 03/03/2018   HCT 43.2 03/03/2018   MCV 89.1 03/03/2018   PLT 291 03/03/2018   Lab Results  Component Value Date   CREATININE 1.10 03/03/2018   BUN 9 03/03/2018   NA 141 03/03/2018   K 3.8 03/03/2018   CL 108 03/03/2018   CO2 23 03/03/2018       Assessment and plan:  1. Encounter to establish care   2. Ventral hernia without obstruction or gangrene -Requested a copy of CT from [facility], never received prior to visit ending. Reviewed discharge paperwork from [facility] which confirmed diagnosis of ventral hernia. Referring patient to General Surgery. Patient was advised he will have to pay out of pocket for referral as he is uninsured. Patient verbalized understanding.   The patient was given clear instructions to go to ER or return to medical center if symptoms don't improve, worsen or new problems develop. The patient verbalized understanding. The patient was advised  to call and obtain lab results if they haven't heard anything from out office within 7-10 business days.  Molli Barrows, FNP Primary Care at  [facility] [address] [place] [address] [phone]: [phone]    This note has been created with Dragon speech recognition software and Engineer, materials. Any transcriptional errors are unintentional.

## 2018-03-17 NOTE — Patient Instructions (Addendum)
Contact the gastroenterologist along with the general surgeon that you have been previously referred to within the [facility] system to inquire about their financial assistance.  You are free to pick up a financial assistance packet today to apply for [place] financial assistance. Take all medications as prescribed if symptoms worsen prior to evaluation by general surgeon, follow-up at the ER.   Thank you for choosing Primary Care at [facility] to be your medical home!    Jimmy [name] was seen by Molli Barrows, FNP today.   Jimmy [name]'s primary care provider is Scot Jun, FNP.   For the best care possible, you should try to see Molli Barrows, FNP-C whenever you come to the clinic.   We look forward to seeing you again soon!  If you have any questions about your visit today, please call us at [phone] or feel free to reach your primary care provider via [place].      Hernia, Adult     A hernia happens when tissue inside your body pushes out through a weak spot in your belly muscles (abdominal wall). This makes a round lump (bulge). The lump may be:  In a scar from surgery that was done in your belly (incisional hernia).  Near your belly button (umbilical hernia).  In your groin (inguinal hernia). Your groin is the area where your leg meets your lower belly (abdomen). This kind of hernia could also be: ? In your scrotum, if you are male. ? In folds of skin around your vagina, if you are male.  In your upper thigh (femoral hernia).  Inside your belly (hiatal hernia). This happens when your stomach slides above the muscle between your belly and your chest (diaphragm). If your hernia is small and it does not cause pain, you may not need treatment. If your hernia is large or it causes pain, you may need surgery. Follow these instructions at home: Activity  Avoid stretching or overusing (straining) the muscles near your hernia. Straining can happen when  you: ? Lift something heavy. ? Poop (have a bowel movement).  Do not lift anything that is heavier than 10 lb (4.5 kg), or the limit that you are told, until your doctor says that it is safe.  Use the strength of your legs when you lift something heavy. Do not use only your back muscles to lift. General instructions  Do these things if told by your doctor so you do not have trouble pooping (constipation): ? Drink enough fluid to keep your pee (urine) pale yellow. ? Eat foods that are high in fiber. These include fresh fruits and vegetables, whole grains, and beans. ? Limit foods that are high in fat and processed sugars. These include foods that are fried or sweet. ? Take medicine for trouble pooping.  When you cough, try to cough gently.  You may try to push your hernia in by very gently pressing on it when you are lying down. Do not try to force the bulge back in if it will not push in easily.  If you are overweight, work with your doctor to lose weight safely.  Do not use any products that have nicotine or tobacco in them. These include cigarettes and e-cigarettes. If you need help quitting, ask your doctor.  If you will be having surgery (hernia repair), watch your hernia for changes in shape, size, or color. Tell your doctor if you see any changes.  Take over-the-counter and prescription medicines only as  told by your doctor.  Keep all follow-up visits as told by your doctor. Contact a doctor if:  You get new pain, swelling, or redness near your hernia.  You poop fewer times in a week than normal.  You have trouble pooping.  You have poop (stool) that is more dry than normal.  You have poop that is harder or larger than normal. Get help right away if:  You have a fever.  You have belly pain that gets worse.  You feel sick to your stomach (nauseous).  You throw up (vomit).  Your hernia cannot be pushed in by very gently pressing on it when you are lying down. Do  not try to force the bulge back in if it will not push in easily.  Your hernia: ? Changes in shape or size. ? Changes color. ? Feels hard or it hurts when you touch it. These symptoms may represent a serious problem that is an emergency. Do not wait to see if the symptoms will go away. Get medical help right away. Call your local emergency services (911 in the [place] Summary  A hernia happens when tissue inside your body pushes out through a weak spot in the belly muscles. This creates a bulge.  If your hernia is small and it does not hurt, you may not need treatment. If your hernia is large or it hurts, you may need surgery.  If you will be having surgery, watch your hernia for changes in shape, size, or color. Tell your doctor about any changes. This information is not intended to replace advice given to you by your health care provider. Make sure you discuss any questions you have with your health care provider. Document Released: 08/22/2009 Document Revised: 12/04/2016 Document Reviewed: 12/04/2016 Elsevier Interactive Patient Education  2019 [employer].

## 2018-03-19 ENCOUNTER — Telehealth: Payer: Self-pay | Admitting: Family Medicine

## 2018-03-19 NOTE — Telephone Encounter (Signed)
Does he want the referral for [facility] in [place] where he was initially referred or a referral here in [place]? Please advise?   Either way, as I explained during the visit, he will have to pay out of pocket for visit as neither facility accepts [place] and he is not eligible for orange cards as [place].    Please advise and I will process once note is completed. Advise patient to allow 7-10 days for referral to be processed and for him to be contacted by the provider.

## 2018-03-19 NOTE — Telephone Encounter (Signed)
Please advise.  I did call [facility] to follow up on the request for the CT done 03/02/2018.

## 2018-03-19 NOTE — Telephone Encounter (Signed)
Patient called requesting a referral to a general surgeon, states he called where he had previously gone but they told him he would need a referral, please follow up.

## 2018-03-20 NOTE — Telephone Encounter (Signed)
Patient would like to be referred to the office in [place].  [facility] at [facility]  Address: [address] [place], [place] [address] Phone: [phone]

## 2018-03-22 NOTE — Telephone Encounter (Signed)
Note complete. Fax referral and office notes.   Thanks!

## 2018-03-23 ENCOUNTER — Telehealth: Payer: Self-pay | Admitting: Family Medicine

## 2018-03-27 NOTE — Telephone Encounter (Signed)
Called to follow up to see if patient has been scheduled. Referral wasn't received. They'd been having fax issues. refaxed referral packet to [phone])

## 2018-03-30 ENCOUNTER — Telehealth: Payer: Self-pay | Admitting: Family Medicine

## 2018-03-30 NOTE — Telephone Encounter (Signed)
Miate,  Please contact this patient and schedule him for an AM appointment. He will need fasting labs. If he is will to fast all day, then appointment can remains at 2:00. He travels from [place] and did not want him to have to make a 3rd trip to the office for fasting labs.  Thanks,  Molli Barrows, FNP

## 2018-04-01 ENCOUNTER — Ambulatory Visit: Payer: Self-pay | Attending: Family Medicine

## 2018-04-01 ENCOUNTER — Encounter: Payer: Self-pay | Admitting: Family Medicine

## 2018-04-01 ENCOUNTER — Ambulatory Visit (INDEPENDENT_AMBULATORY_CARE_PROVIDER_SITE_OTHER): Payer: Self-pay | Admitting: Family Medicine

## 2018-04-01 VITALS — BP 128/61 | HR 64 | Resp 17 | Ht 64.0 in | Wt 157.2 lb

## 2018-04-01 DIAGNOSIS — Z131 Encounter for screening for diabetes mellitus: Secondary | ICD-10-CM

## 2018-04-01 DIAGNOSIS — Z13220 Encounter for screening for lipoid disorders: Secondary | ICD-10-CM

## 2018-04-01 DIAGNOSIS — Z113 Encounter for screening for infections with a predominantly sexual mode of transmission: Secondary | ICD-10-CM

## 2018-04-01 DIAGNOSIS — Z130 Encounter for screening for diseases of the blood and blood-forming organs and certain disorders involving the immune mechanism: Secondary | ICD-10-CM

## 2018-04-01 DIAGNOSIS — Z1389 Encounter for screening for other disorder: Secondary | ICD-10-CM

## 2018-04-01 DIAGNOSIS — Z114 Encounter for screening for human immunodeficiency virus [HIV]: Secondary | ICD-10-CM

## 2018-04-01 DIAGNOSIS — Z0000 Encounter for general adult medical examination without abnormal findings: Secondary | ICD-10-CM

## 2018-04-01 DIAGNOSIS — L720 Epidermal cyst: Secondary | ICD-10-CM

## 2018-04-01 DIAGNOSIS — Z1329 Encounter for screening for other suspected endocrine disorder: Secondary | ICD-10-CM

## 2018-04-01 DIAGNOSIS — Z0001 Encounter for general adult medical examination with abnormal findings: Secondary | ICD-10-CM

## 2018-04-01 DIAGNOSIS — F17210 Nicotine dependence, cigarettes, uncomplicated: Secondary | ICD-10-CM

## 2018-04-01 LAB — POCT URINALYSIS DIP (CLINITEK)
Blood, UA: NEGATIVE
Glucose, UA: NEGATIVE mg/dL
Leukocytes, UA: NEGATIVE
Nitrite, UA: NEGATIVE
Spec Grav, UA: >=1.03 — AB (ref 1.010–1.025)
Urobilinogen, UA: 0.2 E.U./dL
pH, UA: 6 (ref 5.0–8.0)

## 2018-04-01 MED ORDER — CEPHALEXIN 500 MG PO CAPS: 500.0000 mg | ORAL_CAPSULE | Freq: Two times a day (BID) | ORAL | 0 refills | Status: AC

## 2018-04-01 MED ORDER — QUETIAPINE FUMARATE 100 MG PO TABS: 100.0000 mg | ORAL_TABLET | Freq: Every day | ORAL | 2 refills | Status: DC

## 2018-04-01 NOTE — Progress Notes (Signed)
Jimmy [name], is a 28 y.o. male  LOV:[id number]  OAC:[id number]  DOB - 1990-11-22  CC:  Chief Complaint  Patient presents with  . Annual Exam      HPI: Jimmy [name] is a 28 y.o. male is here today to establish care.   Jimmy [name] has Opiate dependence ([place]) on their problem list.   Current smoker of cigarettes 1 pack per day since age 59.History of childhood asthma. No history of diabetes. 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 Cysts are recurrent and occur at multiple areas of body. No dermatology evaluation.  Eye appointment scheduled. History of diminished vision.  Overdue health Maintenance  Declined Influenza Vaccine  Current medications: Current Outpatient Medications:  .  docusate sodium (COLACE) 50 MG capsule, Take 1 capsule (50 mg total) by mouth 2 (two) times daily., Disp: 10 capsule, Rfl: 0 .  omeprazole (PRILOSEC) 40 MG capsule, Take 1 capsule (40 mg total) by mouth daily., Disp: 30 capsule, Rfl: 1 .  QUEtiapine (SEROQUEL) 100 MG tablet, Take 100 mg by mouth at bedtime., Disp: , Rfl:    Pertinent family medical history: family history includes Cancer in his maternal grandfather and maternal grandmother; Diabetes in his maternal aunt, maternal grandmother, and mother. Hypertension in all the above relatives.   Allergies  Allergen Reactions  . Cyclobenzaprine Nausea Only and Hives  . Depakote [Divalproex Sodium] Other (See Comments)    "made him crazy"  . Naproxen Nausea Only    Acid reflux  . Ritalin [Methylphenidate Hcl] Other (See Comments)    Increased hyper activeness   . Tramadol Other (See Comments)    "acid"    Social History   Socioeconomic History  . Marital status: Married    Spouse name: Not on file  . Number of children: Not on file  . Years of education: Not on file  . Highest education level: Not on file  Occupational History  . Not on file  Social Needs  . Financial resource strain: Not on file  . Food insecurity:    Worry: Not  on file    Inability: Not on file  . Transportation needs:    Medical: Not on file    Non-medical: Not on file  Tobacco Use  . Smoking status: Current Every Day Smoker    Packs/day: 1.00  . Smokeless tobacco: Current User  Substance and Sexual Activity  . Alcohol use: No  . Drug use: Not Currently    Comment: Opioids  . Sexual activity: Yes  Lifestyle  . Physical activity:    Days per week: Not on file    Minutes per session: Not on file  . Stress: Not on file  Relationships  . Social connections:    Talks on phone: Not on file    Gets together: Not on file    Attends religious service: Not on file    Active member of club or organization: Not on file    Attends meetings of clubs or organizations: Not on file    Relationship status: Not on file  . Intimate partner violence:    Fear of current or ex partner: Not on file    Emotionally abused: Not on file    Physically abused: Not on file    Forced sexual activity: Not on file  Other Topics Concern  . Not on file  Social History Narrative  . Not on file    Review of Systems: Pertinent negatives listed in HPI Objective:   Vitals:  04/01/18 1405  BP: 128/61  Pulse: 64  Resp: 17  SpO2: 94%    BP Readings from Last 3 Encounters:  04/01/18 128/61  03/17/18 122/76  03/07/18 124/70    Filed Weights   04/01/18 1405  Weight: 157 lb 3.2 oz (71.3 kg)      Physical Exam: Constitutional: Patient appears well-developed and well-nourished. No distress. HENT: Normocephalic, atraumatic, External right and left ear normal. Oropharynx is clear and moist.  Eyes: Conjunctivae and EOM are normal. PERRLA, no scleral icterus. Neck: Normal ROM. Neck supple. No JVD. No tracheal deviation. No thyromegaly. CVS: RRR, S1/S2 +, no murmurs, no gallops, no carotid bruit.  Pulmonary: Effort and breath sounds normal, no stridor, rhonchi, wheezes, rales.  Abdominal: Soft. BS +, no distension, tenderness, rebound or guarding.   Musculoskeletal: Normal range of motion. No edema and no tenderness.  Neuro: Alert. Normal muscle tone coordination. Normal gait. BUE and BLE strength 5/5. Bilateral hand grips symmetrical. Skin: Skin is warm and dry. No rash noted. Not diaphoretic. No erythema. No pallor. Psychiatric: Normal mood and affect. Behavior, judgment, thought content normal.  Lab Results (prior encounters)  Lab Results  Component Value Date   WBC 8.1 03/03/2018   HGB 13.8 03/03/2018   HCT 43.2 03/03/2018   MCV 89.1 03/03/2018   PLT 291 03/03/2018   Lab Results  Component Value Date   CREATININE 1.10 03/03/2018   BUN 9 03/03/2018   NA 141 03/03/2018   K 3.8 03/03/2018   CL 108 03/03/2018   CO2 23 03/03/2018       Assessment and plan:  1. Annual physical exam Age-appropriate anticipatory guidance    2. Thyroid disorder screen - TSH  3. Lipid screening - Lipid Panel  4. Diabetes mellitus screening - Hemoglobin A1c  5. Screening for HIV (human immunodeficiency virus) - HIV antibody (with reflex)  6. Screening for STDs (sexually transmitted diseases) - HIV antibody (with reflex) - GC/Chlamydia Probe Amp([facility]) - RPR   Return in about 3 weeks (around 04/22/2018) for ear cyst follow-up.   The patient was given clear instructions to go to ER or return to medical center if symptoms don't improve, worsen or new problems develop. The patient verbalized understanding. The patient was advised  to call and obtain lab results if they haven't heard anything from out office within 7-10 business days.  Jimmy Barrows, FNP Primary Care at [facility] [address], [place] [address] [phone]: [phone]    This note has been created with Dragon speech recognition software and Engineer, materials. Any transcriptional errors are unintentional.

## 2018-04-01 NOTE — Patient Instructions (Signed)

## 2018-04-02 LAB — RPR: RPR Ser Ql: NONREACTIVE

## 2018-04-02 LAB — LIPID PANEL
Chol/HDL Ratio: 3.5 ratio (ref 0.0–5.0)
Cholesterol, Total: 181 mg/dL (ref 100–199)
HDL: 51 mg/dL (ref >39)
LDL Calculated: 117 mg/dL — ABNORMAL HIGH (ref 0–99)
Triglycerides: 66 mg/dL (ref 0–149)
VLDL Cholesterol Cal: 13 mg/dL (ref 5–40)

## 2018-04-02 LAB — HEMOGLOBIN A1C
Est. average glucose Bld gHb Est-mCnc: 111 mg/dL
Hgb A1c MFr Bld: 5.5 % (ref 4.8–5.6)

## 2018-04-02 LAB — HIV ANTIBODY (ROUTINE TESTING W REFLEX): HIV Screen 4th Generation wRfx: NONREACTIVE

## 2018-04-02 LAB — TSH: TSH: 0.799 u[IU]/mL (ref 0.450–4.500)

## 2018-04-03 LAB — GC/CHLAMYDIA PROBE AMP
Chlamydia trachomatis, NAA: NEGATIVE
Neisseria gonorrhoeae by PCR: NEGATIVE

## 2018-04-10 ENCOUNTER — Telehealth: Payer: Self-pay | Admitting: Family Medicine

## 2018-04-10 NOTE — Telephone Encounter (Signed)
Patient called to ask about their notarized letter of support. They believe they need a new letter. Please follow up.

## 2018-04-14 NOTE — Telephone Encounter (Signed)
I sent a blank letter to the Pt for been notarize

## 2018-04-20 NOTE — Progress Notes (Signed)
Normal lab letter mailed.

## 2018-04-22 ENCOUNTER — Ambulatory Visit: Payer: Self-pay | Admitting: Family Medicine

## 2018-04-30 ENCOUNTER — Ambulatory Visit: Payer: Self-pay | Attending: Family Medicine

## 2018-05-07 ENCOUNTER — Ambulatory Visit: Payer: Self-pay | Admitting: Family Medicine

## 2018-05-11 ENCOUNTER — Telehealth: Payer: Self-pay | Admitting: Family Medicine

## 2018-05-11 NOTE — Telephone Encounter (Signed)
Patients wife called because she would like a letter stating patient was approved for financial assistance in order for the patients operation to begin. Please follow up.

## 2018-05-11 NOTE — Telephone Encounter (Signed)
[employer] has been approve, sending letter today

## 2018-05-20 ENCOUNTER — Encounter: Payer: Self-pay | Admitting: Surgery

## 2018-05-20 ENCOUNTER — Ambulatory Visit (INDEPENDENT_AMBULATORY_CARE_PROVIDER_SITE_OTHER): Payer: Self-pay | Admitting: Surgery

## 2018-05-20 ENCOUNTER — Encounter: Payer: Self-pay | Admitting: *Deleted

## 2018-05-20 ENCOUNTER — Other Ambulatory Visit: Payer: Self-pay

## 2018-05-20 VITALS — BP 129/72 | HR 86 | Temp 97.7°F | Resp 16 | Ht 64.0 in | Wt 162.8 lb

## 2018-05-20 DIAGNOSIS — K432 Incisional hernia without obstruction or gangrene: Secondary | ICD-10-CM

## 2018-05-20 NOTE — Progress Notes (Signed)
05/20/2018  Reason for Visit:  Ventral hernia  Referring Provider:  Molli Barrows, NP  History of Present Illness: Jimmy [name] is a 28 y.o. male presenting for evaluation of a ventral hernia.  Patient reports that he's had the hernia for at least a year, but has become more painful since November/December 2019.  He had an outpatient CT scan done on 02/2018 which showed a small fat containing ventral hernia in the midline above umbilicus.  He was referred for further evaluation.  The patient reports that the site is always tender, particularly if he's straining in any way like coughing or sneezing.  It does interfere with his job because he does heavy lifting at times.  He used to work Architect as well.  Denies any nausea, vomiting, constipation or diarrhea, but reports that sometimes after he eats he feels that it's tight in the upper abdomen towards the hernia site.  He has lost weight recently because of the pain he has.  Of note, he reports he had a surgery for his esophagus as an infant.  Past Medical History: Past Medical History:  Diagnosis Date  . ADHD (attention deficit hyperactivity disorder)   . Arthritis   . Migraine   . Scoliosis      Past Surgical History: Past Surgical History:  Procedure Laterality Date  . ESOPHAGUS SURGERY      Home Medications: Prior to Admission medications   Medication Sig Start Date End Date Taking? Authorizing Provider  docusate sodium (COLACE) 50 MG capsule Take 1 capsule (50 mg total) by mouth 2 (two) times daily. 03/17/18  Yes Scot Jun, FNP  omeprazole (PRILOSEC) 20 MG capsule TAKE 1 CAPSULE BY MOUTH TWICE DAILY AS NEEDED 03/03/18  Yes [provider]  QUEtiapine (SEROQUEL) 100 MG tablet Take 1 tablet (100 mg total) by mouth at bedtime. 04/01/18  Yes Scot Jun, FNP    Allergies: Allergies  Allergen Reactions  . Cyclobenzaprine Nausea Only and Hives  . Depakote [Divalproex Sodium] Other (See Comments)     "made him crazy"  . Naproxen Nausea Only    Acid reflux  . Ritalin [Methylphenidate Hcl] Other (See Comments)    Increased hyper activeness   . Tramadol Other (See Comments)    "acid"    Social History:  reports that he has been smoking. He has been smoking about 1.00 pack per day. He uses smokeless tobacco. He reports previous drug use. He reports that he does not drink alcohol.   Family History: Family History  Problem Relation Age of Onset  . Diabetes Mother   . Diabetes Maternal Grandmother   . Cancer Maternal Grandmother        unknown type. doing chemo  . Cancer Maternal Grandfather   . Diabetes Maternal Aunt     Review of Systems: Review of Systems  Constitutional: Positive for weight loss. Negative for chills and fever.  HENT: Negative for hearing loss.   Respiratory: Negative for cough.   Cardiovascular: Negative for chest pain.  Gastrointestinal: Positive for abdominal pain. Negative for constipation, diarrhea, nausea and vomiting.  Genitourinary: Negative for dysuria.  Musculoskeletal: Negative for myalgias.  Neurological: Negative for dizziness.  Psychiatric/Behavioral: Negative for depression.    Physical Exam BP 129/72   Pulse 86   Temp 97.7 F (36.5 C) (Temporal)   Resp 16   Ht 5\' 4"  (1.626 m)   Wt 162 lb 12.8 oz (73.8 kg)   SpO2 98%   BMI 27.94 kg/m  CONSTITUTIONAL:  No acute distress HEENT:  Normocephalic, atraumatic, extraocular motion intact. NECK: Trachea is midline, and there is no jugular venous distension.  RESPIRATORY:  Lungs are clear, and breath sounds are equal bilaterally. Normal respiratory effort without pathologic use of accessory muscles. CARDIOVASCULAR: Heart is regular without murmurs, gallops, or rubs. GI: The abdomen is soft, non-distended, with tenderness about 6 cm superior to the umbilicus. This is at the medial corner of his prior incision, and there appears to be a 1 cm defect, though there is not much bulging at the  site.  Unclear if he had esophageal surgery, though the incision is transverse in the right upper quadrant, ending at the midline.  Could possibly be pyloric stenosis incision.  There were no palpable masses.  MUSCULOSKELETAL:  Normal muscle strength and tone in all four extremities.  No peripheral edema or cyanosis. SKIN: Skin turgor is normal. There are no pathologic skin lesions.  NEUROLOGIC:  Motor and sensation is grossly normal.  Cranial nerves are grossly intact. PSYCH:  Alert and oriented to person, place and time. Affect is normal.  Laboratory Analysis: No results found for this or any previous visit (from the past 24 hour(s)).  Imaging: CT scan 03/02/18: Impression: 1.  No evidence of acute abnormalities within the abdomen or pelvis. 2.  Midline ventral upper abdominal hernia containing fat measuring up to 14 mm.  Assessment and Plan: This is a 28 y.o. male with likely an incisional ventral hernia.  Discussed with the patient that I believe his hernia is likely incisional in nature given the proximity of it to the medial corner of his transverse scar.  Discussed the option of surgical repair and that we would plan on an open approach, possibly with mesh if the defect is big enough, otherwise primary repair with sutures.  Discussed with him the risks of bleeding, infection, and injury to surrounding structures and he's willing to proceed.  He would like to have surgery as soon as possible due to the pain.  Will schedule him for 05/27/18.  Discussed that he would have a no heavy lifting or pushing restriction of no more than 10-15 lbs for a period of 4 weeks, but that he would work with reduced duties until then.  He is in agreement with this plan and all of his questions have been answered.  Face-to-face time spent with the patient and care providers was 60 minutes, with more than 50% of the time spent counseling, educating, and coordinating care of the patient.     Melvyn Neth,  [facility]

## 2018-05-20 NOTE — Patient Instructions (Addendum)
You have requested to have a Ventral Hernia Repair.   You will need to arrange to be out of work for approximately 1-2 weeks and then you may return with a lifting restriction for 4 more weeks. If you have FMLA or Disability paperwork that needs to be filled out, please have your company fax your paperwork to [phone] or you may drop this by either office. This paperwork will be filled out within 3 days after your surgery has been completed.    Ventral Hernia A ventral hernia (also called an incisional hernia) is a hernia that occurs at the site of a previous surgical cut (incision) in the abdomen. The abdominal wall spans from your lower chest down to your pelvis. If the abdominal wall is weakened from a surgical incision, a hernia can occur. A hernia is a bulge of bowel or muscle tissue pushing out on the weakened part of the abdominal wall. Ventral hernias can get bigger from straining or lifting. Obese and older people are at higher risk for a ventral hernia. People who develop infections after surgery or require repeat incisions at the same site on the abdomen are also at increased risk. CAUSES  A ventral hernia occurs because of weakness in the abdominal wall at an incision site.  SYMPTOMS  Common symptoms include:  A visible bulge or lump on the abdominal wall.  Pain or tenderness around the lump.  Increased discomfort if you cough or make a sudden movement. If the hernia has blocked part of the intestine, a serious complication can occur (incarcerated or strangulated hernia). This can become a problem that requires emergency surgery because the blood flow to the blocked intestine may be cut off. Symptoms may include:  Feeling sick to your stomach (nauseous).  Throwing up (vomiting).  Stomach swelling (distention) or bloating.  Fever.  Rapid heartbeat. DIAGNOSIS  Your health care provider will take a medical history and perform a physical exam. Various tests may be ordered,  such as:  Blood tests.  Urine tests.  Ultrasonography.  X-rays.  Computed tomography (CT). TREATMENT  Watchful waiting may be all that is needed for a smaller hernia that does not cause symptoms. Your health care provider may recommend the use of a supportive belt (truss) that helps to keep the abdominal wall intact. For larger hernias or those that cause pain, surgery to repair the hernia is usually recommended. If a hernia becomes strangulated, emergency surgery needs to be done right away. HOME CARE INSTRUCTIONS  Avoid putting pressure or strain on the abdominal area.  Avoid heavy lifting.  Use good body positioning for physical tasks. Ask your health care provider about proper body positioning.  Use a supportive belt as directed by your health care provider.  Maintain a healthy weight.  Eat foods that are high in fiber, such as whole grains, fruits, and vegetables. Fiber helps prevent difficult bowel movements (constipation).  Drink enough fluids to keep your urine clear or pale yellow.  Follow up with your health care provider as directed. SEEK MEDICAL CARE IF:   Your hernia seems to be getting larger or more painful. SEEK IMMEDIATE MEDICAL CARE IF:   You have abdominal pain that is sudden and sharp.  Your pain becomes severe.  You have repeated vomiting.  You are sweating a lot.  You notice a rapid heartbeat.  You develop a fever. MAKE SURE YOU:   Understand these instructions.  Will watch your condition.  Will get help right away if you are  not doing well or get worse.     Open Ventral Hernia Repair Open ventral hernia repairis a surgery to fix a ventral hernia. Aventral hernia,  is a bulge of body tissue or intestines that pushes through the front part of the abdomen. This can happen if the connective tissue covering the muscles over the abdomen has a weak spot or is torn because of a surgical cut (incision) from a previous surgery. A ventral hernia  repair is often done soon after diagnosis to stop the hernia from getting bigger, becoming uncomfortable, or becoming an emergency. This surgery usually takes about 2 hours, but the time can vary greatly.  LET [facility] CARE PROVIDER KNOW ABOUT:  Any allergies you have.  All medicines you are taking, including steroids, vitamins, herbs, eye drops, creams, and over-the-counter medicines.  Previous problems you or members of your family have had with the use of anesthetics.  Any blood disorders you have.  Previous surgeries you have had.  Medical conditions you have.  RISKS AND COMPLICATIONS  Generally, Open ventral hernia repair is a safe procedure. However, as with any surgical procedure, problems can occur. Possible problems include:  Bleeding.  Trouble passing urine or having a bowel movement after the surgery.  Infection.  Pneumonia.  Blood clots.  Pain in the area of the hernia.  A bulge in the area of the hernia that may be caused by a collection of fluid.  Injury to intestines or other structures in the abdomen.  Return of the hernia after surgery.  BEFORE THE PROCEDURE   You may need to have blood tests, urine tests, a chest X-ray, or an electrocardiogram done before the day of the surgery.  Ask your health care provider about changing or stopping your regular medicines. This is especially important if you are taking diabetes medicines or blood thinners.  You may need to wash with a special type of germ-killing soap.  Do not eat or drink anything after midnight the night before the procedure or as directed by your health care provider.  Make plans to have someone drive you home after the procedure.  PROCEDURE   Small monitors will be put on your body. They are used to check your heart, blood pressure, and oxygen level.  An IV access tube will be put into a vein in your hand or arm. Fluids and medicine will flow directly into your body through the IV  tube.  You will be given medicine that makes you go to sleep (general anesthetic).  Your abdomen will be cleaned with a special soap to kill any germs on your skin.  Once you are asleep, a moderate - large size incision will be made in your abdomen. The size of incision depends on how large your hernia is.  Your surgeon puts the tissue or intestines that formed the hernia back in place.  A screen-like patch (mesh) is used to close the hernia. This helps make the area stronger. Stitches, tacks, or staples are used to keep the mesh in place.  Medicine and a bandage (dressing) or skin glue will be put over the incision.  AFTER THE PROCEDURE   You will stay in a recovery area until the anesthetic wears off. Your blood pressure and pulse will be checked often.  You may be able to go home the same day or may need to stay in the hospital for 1-2 days after surgery. Your surgeon will decide when you can go home depending upon your  recovery.  You may feel some pain. You will be given medicine for pain.  You will be urged to do breathing exercises that involve taking deep breaths. This helps prevent a lung infection after a surgery.  You may have to wear compression stockings while you are in the hospital. These stockings help keep blood clots from forming in your legs.   This information is not intended to replace advice given to you by your health care provider. Make sure you discuss any questions you have with your health care provider.   Document Released: 02/19/2012 Document Revised: 03/09/2013 Document Reviewed: 02/19/2012 Elsevier Interactive Patient Education [employer].

## 2018-05-20 NOTE — Progress Notes (Signed)
Patient's surgery to be scheduled for 05-27-18 at [facility] with Dr. Hampton Abbot.   The patient is aware he will be contacted by the [facility] to complete a phone interview sometime in the near future.  The patient is aware to call the office should he have further questions.

## 2018-05-20 NOTE — H&P (View-Only) (Signed)
05/20/2018  Reason for Visit:  Ventral hernia  Referring Provider:  Molli Barrows, NP  History of Present Illness: Jimmy [name] is a 28 y.o. male presenting for evaluation of a ventral hernia.  Patient reports that he's had the hernia for at least a year, but has become more painful since November/December 2019.  He had an outpatient CT scan done on 02/2018 which showed a small fat containing ventral hernia in the midline above umbilicus.  He was referred for further evaluation.  The patient reports that the site is always tender, particularly if he's straining in any way like coughing or sneezing.  It does interfere with his job because he does heavy lifting at times.  He used to work Architect as well.  Denies any nausea, vomiting, constipation or diarrhea, but reports that sometimes after he eats he feels that it's tight in the upper abdomen towards the hernia site.  He has lost weight recently because of the pain he has.  Of note, he reports he had a surgery for his esophagus as an infant.  Past Medical History: Past Medical History:  Diagnosis Date  . ADHD (attention deficit hyperactivity disorder)   . Arthritis   . Migraine   . Scoliosis      Past Surgical History: Past Surgical History:  Procedure Laterality Date  . ESOPHAGUS SURGERY      Home Medications: Prior to Admission medications   Medication Sig Start Date End Date Taking? Authorizing Provider  docusate sodium (COLACE) 50 MG capsule Take 1 capsule (50 mg total) by mouth 2 (two) times daily. 03/17/18  Yes Scot Jun, FNP  omeprazole (PRILOSEC) 20 MG capsule TAKE 1 CAPSULE BY MOUTH TWICE DAILY AS NEEDED 03/03/18  Yes [provider]  QUEtiapine (SEROQUEL) 100 MG tablet Take 1 tablet (100 mg total) by mouth at bedtime. 04/01/18  Yes Scot Jun, FNP    Allergies: Allergies  Allergen Reactions  . Cyclobenzaprine Nausea Only and Hives  . Depakote [Divalproex Sodium] Other (See Comments)     "made him crazy"  . Naproxen Nausea Only    Acid reflux  . Ritalin [Methylphenidate Hcl] Other (See Comments)    Increased hyper activeness   . Tramadol Other (See Comments)    "acid"    Social History:  reports that he has been smoking. He has been smoking about 1.00 pack per day. He uses smokeless tobacco. He reports previous drug use. He reports that he does not drink alcohol.   Family History: Family History  Problem Relation Age of Onset  . Diabetes Mother   . Diabetes Maternal Grandmother   . Cancer Maternal Grandmother        unknown type. doing chemo  . Cancer Maternal Grandfather   . Diabetes Maternal Aunt     Review of Systems: Review of Systems  Constitutional: Positive for weight loss. Negative for chills and fever.  HENT: Negative for hearing loss.   Respiratory: Negative for cough.   Cardiovascular: Negative for chest pain.  Gastrointestinal: Positive for abdominal pain. Negative for constipation, diarrhea, nausea and vomiting.  Genitourinary: Negative for dysuria.  Musculoskeletal: Negative for myalgias.  Neurological: Negative for dizziness.  Psychiatric/Behavioral: Negative for depression.    Physical Exam BP 129/72   Pulse 86   Temp 97.7 F (36.5 C) (Temporal)   Resp 16   Ht 5\' 4"  (1.626 m)   Wt 162 lb 12.8 oz (73.8 kg)   SpO2 98%   BMI 27.94 kg/m  CONSTITUTIONAL:  No acute distress HEENT:  Normocephalic, atraumatic, extraocular motion intact. NECK: Trachea is midline, and there is no jugular venous distension.  RESPIRATORY:  Lungs are clear, and breath sounds are equal bilaterally. Normal respiratory effort without pathologic use of accessory muscles. CARDIOVASCULAR: Heart is regular without murmurs, gallops, or rubs. GI: The abdomen is soft, non-distended, with tenderness about 6 cm superior to the umbilicus. This is at the medial corner of his prior incision, and there appears to be a 1 cm defect, though there is not much bulging at the  site.  Unclear if he had esophageal surgery, though the incision is transverse in the right upper quadrant, ending at the midline.  Could possibly be pyloric stenosis incision.  There were no palpable masses.  MUSCULOSKELETAL:  Normal muscle strength and tone in all four extremities.  No peripheral edema or cyanosis. SKIN: Skin turgor is normal. There are no pathologic skin lesions.  NEUROLOGIC:  Motor and sensation is grossly normal.  Cranial nerves are grossly intact. PSYCH:  Alert and oriented to person, place and time. Affect is normal.  Laboratory Analysis: No results found for this or any previous visit (from the past 24 hour(s)).  Imaging: CT scan 03/02/18: Impression: 1.  No evidence of acute abnormalities within the abdomen or pelvis. 2.  Midline ventral upper abdominal hernia containing fat measuring up to 14 mm.  Assessment and Plan: This is a 28 y.o. male with likely an incisional ventral hernia.  Discussed with the patient that I believe his hernia is likely incisional in nature given the proximity of it to the medial corner of his transverse scar.  Discussed the option of surgical repair and that we would plan on an open approach, possibly with mesh if the defect is big enough, otherwise primary repair with sutures.  Discussed with him the risks of bleeding, infection, and injury to surrounding structures and he's willing to proceed.  He would like to have surgery as soon as possible due to the pain.  Will schedule him for 05/27/18.  Discussed that he would have a no heavy lifting or pushing restriction of no more than 10-15 lbs for a period of 4 weeks, but that he would work with reduced duties until then.  He is in agreement with this plan and all of his questions have been answered.  Face-to-face time spent with the patient and care providers was 60 minutes, with more than 50% of the time spent counseling, educating, and coordinating care of the patient.     Melvyn Neth,  [facility]

## 2018-05-21 ENCOUNTER — Telehealth: Payer: Self-pay | Admitting: Family Medicine

## 2018-05-21 NOTE — Telephone Encounter (Signed)
Patient's wife called on behalf of patient requesting a referral to dermatology, I noticed it was mentioned in his last office note about his skin cysts and patient stated that PCP told him that after he was approved for OC she would send one out. Please follow up.

## 2018-05-21 NOTE — Telephone Encounter (Signed)
Please advise. Patient's orange card has been approved.

## 2018-05-22 ENCOUNTER — Telehealth: Payer: Self-pay | Admitting: Family Medicine

## 2018-05-22 NOTE — Telephone Encounter (Signed)
Referral has been placed. 

## 2018-05-22 NOTE — Addendum Note (Signed)
Addended by: Scot Jun on: 05/22/2018 07:42 AM   Modules accepted: Orders

## 2018-05-22 NOTE — Telephone Encounter (Signed)
Patient don't have [employer]  He lives in [place] and is only for [facility]   He only has the letter with [place].

## 2018-05-25 ENCOUNTER — Other Ambulatory Visit: Payer: Self-pay

## 2018-05-25 ENCOUNTER — Encounter
Admission: RE | Admit: 2018-05-25 | Discharge: 2018-05-25 | Disposition: A | Discharge status: Home / Self Care | Payer: Self-pay | Source: Ambulatory Visit | Attending: Surgery | Admitting: Surgery

## 2018-05-25 HISTORY — DX: Anxiety disorder, unspecified: F41.9

## 2018-05-25 HISTORY — DX: Injury of unspecified nerves of neck, initial encounter: S14.9XXA

## 2018-05-25 HISTORY — DX: Gastro-esophageal reflux disease without esophagitis: K21.9

## 2018-05-25 HISTORY — DX: Mononeuropathy, unspecified: G58.9

## 2018-05-25 NOTE — Patient Instructions (Signed)
Your procedure is scheduled on: 05-27-18 Report to Same Day Surgery 2nd floor medical mall [facility] Entrance-take elevator on left to 2nd floor.  Check in with surgery information desk.) To find out your arrival time please call [phone] between 1PM - 3PM on 05-26-18  Remember: Instructions that are not followed completely may result in serious medical risk, up to and including death, or upon the discretion of your surgeon and anesthesiologist your surgery may need to be rescheduled.    _x___ 1. Do not eat food after midnight the night before your procedure. You may drink clear liquids up to 2 hours before you are scheduled to arrive at the hospital for your procedure.  Do not drink clear liquids within 2 hours of your scheduled arrival to the hospital.  Clear liquids include  --Water or Apple juice without pulp  --Clear carbohydrate beverage such as ClearFast or Gatorade  --Black Coffee or Clear Tea (No milk, no creamers, do not add anything to  the coffee or Tea   ____Ensure clear carbohydrate drink on the way to the hospital for bariatric patients  ____Ensure clear carbohydrate drink 3 hours before surgery for Dr Dwyane Luo patients if physician instructed.   No gum chewing or hard candies.     __x__ 2. No Alcohol for 24 hours before or after surgery.   __x__3. No Smoking or e-cigarettes for 24 prior to surgery.  Do not use any chewable tobacco products for at least 6 hour prior to surgery   ____  4. Bring all medications with you on the day of surgery if instructed.    __x__ 5. Notify your doctor if there is any change in your medical condition     (cold, fever, infections).    x___6. On the morning of surgery brush your teeth with toothpaste and water.  You may rinse your mouth with mouth wash if you wish.  Do not swallow any toothpaste or mouthwash.   Do not wear jewelry, make-up, hairpins, clips or nail polish.  Do not wear lotions, powders, or perfumes. You may wear  deodorant.  Do not shave 48 hours prior to surgery. Men may shave face and neck.  Do not bring valuables to the hospital.    [facility] is not responsible for any belongings or valuables.               Contacts, dentures or bridgework may not be worn into surgery.  Leave your suitcase in the car. After surgery it may be brought to your room.  For patients admitted to the hospital, discharge time is determined by your treatment team.  _  Patients discharged the day of surgery will not be allowed to drive home.  You will need someone to drive you home and stay with you the night of your procedure.    Please read over the following fact sheets that you were given:   [facility] Preparing for Surgery   ____ Take anti-hypertensive listed below, cardiac, seizure, asthma, anti-reflux and psychiatric medicines. These include:  1. NONE  2.  3.  4.  5.  6.  ____Fleets enema or Magnesium Citrate as directed.   ____ Use CHG Soap or sage wipes as directed on instruction sheet   ____ Use inhalers on the day of surgery and bring to hospital day of surgery  ____ Stop Metformin and Janumet 2 days prior to surgery.    ____ Take 1/2 of usual insulin dose the night before surgery and none  on the morning surgery.   ____ Follow recommendations from Cardiologist, Pulmonologist or PCP regarding stopping Aspirin, Coumadin, Plavix ,Eliquis, Effient, or Pradaxa, and Pletal.  X____Stop Anti-inflammatories such as Advil, Aleve, Ibuprofen, Motrin, Naproxen, Naprosyn, Goodies powders or aspirin products NOW-OK to take Tylenol    ____ Stop supplements until after surgery   ____ Bring C-Pap to the hospital.

## 2018-05-27 ENCOUNTER — Encounter: Payer: Self-pay | Admitting: Anesthesiology

## 2018-05-27 ENCOUNTER — Ambulatory Visit
Admission: RE | Admit: 2018-05-27 | Discharge: 2018-05-27 | Disposition: A | Discharge status: Home / Self Care | Payer: Self-pay | Attending: Surgery | Admitting: Surgery

## 2018-05-27 ENCOUNTER — Encounter: Payer: Self-pay | Admitting: *Deleted

## 2018-05-27 ENCOUNTER — Encounter
Admission: RE | Disposition: A | Discharge status: Home / Self Care | Payer: Self-pay | Source: Home / Self Care | Attending: Surgery

## 2018-05-27 DIAGNOSIS — Z5309 Procedure and treatment not carried out because of other contraindication: Secondary | ICD-10-CM | POA: Insufficient documentation

## 2018-05-27 DIAGNOSIS — K432 Incisional hernia without obstruction or gangrene: Secondary | ICD-10-CM | POA: Insufficient documentation

## 2018-05-27 LAB — URINE DRUG SCREEN, QUALITATIVE (ARMC ONLY)
Amphetamines, Ur Screen: NOT DETECTED
Barbiturates, Ur Screen: NOT DETECTED
Benzodiazepine, Ur Scrn: NOT DETECTED
Cannabinoid 50 Ng, Ur ~~LOC~~: POSITIVE — AB
Cocaine Metabolite,Ur ~~LOC~~: POSITIVE — AB
MDMA (Ecstasy)Ur Screen: NOT DETECTED
Methadone Scn, Ur: NOT DETECTED
Opiate, Ur Screen: NOT DETECTED
Phencyclidine (PCP) Ur S: NOT DETECTED
Tricyclic, Ur Screen: NOT DETECTED

## 2018-05-27 SURGERY — REPAIR, HERNIA, INCISIONAL
Anesthesia: General

## 2018-05-27 MED ORDER — BUPIVACAINE LIPOSOME 1.3 % IJ SUSP
INTRAMUSCULAR | Status: AC
  Filled 2018-05-27: qty 20

## 2018-05-27 MED ORDER — BUPIVACAINE LIPOSOME 1.3 % IJ SUSP: 20.0000 mL | Freq: Once | INTRAMUSCULAR | Status: DC

## 2018-05-27 MED ORDER — ACETAMINOPHEN 500 MG PO TABS
1000.0000 mg | ORAL_TABLET | ORAL | Status: AC
  Administered 2018-05-27: 1000 mg via ORAL

## 2018-05-27 MED ORDER — CEFAZOLIN SODIUM-DEXTROSE 2-4 GM/100ML-% IV SOLN: 2.0000 g | INTRAVENOUS | Status: DC

## 2018-05-27 MED ORDER — LACTATED RINGERS IV SOLN
INTRAVENOUS | Status: DC
  Administered 2018-05-27: 13:00:00 via INTRAVENOUS

## 2018-05-27 MED ORDER — FAMOTIDINE 20 MG PO TABS
ORAL_TABLET | ORAL | Status: AC
  Administered 2018-05-27: 20 mg via ORAL
  Filled 2018-05-27: qty 1

## 2018-05-27 MED ORDER — CHLORHEXIDINE GLUCONATE CLOTH 2 % EX PADS: 6.0000 | MEDICATED_PAD | Freq: Once | CUTANEOUS | Status: DC

## 2018-05-27 MED ORDER — BUPIVACAINE-EPINEPHRINE (PF) 0.25% -1:200000 IJ SOLN
INTRAMUSCULAR | Status: AC
  Filled 2018-05-27: qty 30

## 2018-05-27 MED ORDER — GABAPENTIN 300 MG PO CAPS
ORAL_CAPSULE | ORAL | Status: AC
  Administered 2018-05-27: 300 mg via ORAL
  Filled 2018-05-27: qty 1

## 2018-05-27 MED ORDER — FAMOTIDINE 20 MG PO TABS
20.0000 mg | ORAL_TABLET | Freq: Once | ORAL | Status: AC
  Administered 2018-05-27: 20 mg via ORAL

## 2018-05-27 MED ORDER — GABAPENTIN 300 MG PO CAPS
300.0000 mg | ORAL_CAPSULE | ORAL | Status: AC
  Administered 2018-05-27: 300 mg via ORAL

## 2018-05-27 MED ORDER — CHLORHEXIDINE GLUCONATE CLOTH 2 % EX PADS
6.0000 | MEDICATED_PAD | Freq: Once | CUTANEOUS | Status: AC
  Administered 2018-05-27: 6 via TOPICAL

## 2018-05-27 MED ORDER — ACETAMINOPHEN 500 MG PO TABS
ORAL_TABLET | ORAL | Status: AC
  Administered 2018-05-27: 1000 mg via ORAL
  Filled 2018-05-27: qty 2

## 2018-05-27 MED ORDER — CEFAZOLIN SODIUM-DEXTROSE 2-4 GM/100ML-% IV SOLN
INTRAVENOUS | Status: AC
  Filled 2018-05-27: qty 100

## 2018-05-27 SURGICAL SUPPLY — 31 items
ADH SKN CLS APL DERMABOND .7 (GAUZE/BANDAGES/DRESSINGS) ×1
APL PRP STRL LF DISP 70% ISPRP (MISCELLANEOUS) ×1
CANISTER SUCT 1200ML W/VALVE (MISCELLANEOUS) ×3 IMPLANT
CHLORAPREP W/TINT 26 (MISCELLANEOUS) ×3 IMPLANT
COVER WAND RF STERILE (DRAPES) IMPLANT
DERMABOND ADVANCED (GAUZE/BANDAGES/DRESSINGS) ×1
DERMABOND ADVANCED .7 DNX12 (GAUZE/BANDAGES/DRESSINGS) ×2 IMPLANT
DRAPE LAPAROTOMY 100X77 ABD (DRAPES) ×3 IMPLANT
ELECT CAUTERY BLADE 6.4 (BLADE) ×3 IMPLANT
ELECT REM PT RETURN 9FT ADLT (ELECTROSURGICAL) ×2
ELECTRODE REM PT RTRN 9FT ADLT (ELECTROSURGICAL) ×2 IMPLANT
GLOVE PROTEXIS LATEX SZ 7.5 (GLOVE) ×2 IMPLANT
GLOVE SURG LATEX 7.5 PF (GLOVE) ×1 IMPLANT
GLOVE SURG SYN 7.0 (GLOVE) ×2 IMPLANT
GLOVE SURG SYN 7.0 PF PI (GLOVE) ×1 IMPLANT
GOWN STRL REUS W/ TWL LRG LVL3 (GOWN DISPOSABLE) ×4 IMPLANT
GOWN STRL REUS W/TWL LRG LVL3 (GOWN DISPOSABLE) ×4
NEEDLE HYPO 22GX1.5 SAFETY (NEEDLE) ×3 IMPLANT
NS IRRIG 500ML POUR BTL (IV SOLUTION) ×3 IMPLANT
PACK BASIN MINOR ARMC (MISCELLANEOUS) ×3 IMPLANT
SUT ETHIBOND 0 (SUTURE) ×3 IMPLANT
SUT ETHIBOND 0 MO6 C/R (SUTURE) ×3 IMPLANT
SUT MNCRL AB 4-0 PS2 18 (SUTURE) ×3 IMPLANT
SUT PROLENE 2 0 SH DA (SUTURE) ×3 IMPLANT
SUT SILK 0 (SUTURE) ×2
SUT SILK 0 30XBRD TIE 6 (SUTURE) ×2 IMPLANT
SUT SILK 0 SH 30 (SUTURE) ×3 IMPLANT
SUT VIC AB 0 SH 27 (SUTURE) ×3 IMPLANT
SUT VIC AB 3-0 SH 27 (SUTURE) ×6
SUT VIC AB 3-0 SH 27X BRD (SUTURE) ×6 IMPLANT
SYR 10ML LL (SYRINGE) ×3 IMPLANT

## 2018-05-27 NOTE — Interval H&P Note (Signed)
History and Physical Interval Note:  05/27/2018 2:44 PM  Patient seen in preop.  He presents today for incisional hernia repair.  However, he had a positive urine drug screen for cocaine and marijuana.  Given the positivity for cocaine, case will be canceled today.  Discussed this with patient, and the importance to refrain from drugs.  He is in agreement and will re-schedule case for tentative date 06/08/18.   Cassidee Deats

## 2018-05-27 NOTE — Anesthesia Preprocedure Evaluation (Deleted)
Anesthesia Evaluation  Patient identified by MRN, date of birth, ID band Patient awake    Reviewed: Allergy & Precautions, H&P , NPO status , reviewed documented beta blocker date and time   Airway        Dental   Pulmonary Current Smoker,           Cardiovascular      Neuro/Psych  Headaches, PSYCHIATRIC DISORDERS Anxiety  Neuromuscular disease    GI/Hepatic GERD  ,  Endo/Other    Renal/GU      Musculoskeletal  (+) Arthritis ,   Abdominal   Peds  Hematology   Anesthesia Other Findings Past Medical History: No date: ADHD (attention deficit hyperactivity disorder) No date: Anxiety No date: Arthritis No date: GERD (gastroesophageal reflux disease)     Comment:  no meds No date: Migraine     Comment:  migraines due to pinched nerve No date: Pinched nerve in neck No date: Scoliosis Past Surgical History: No date: ESOPHAGUS SURGERY   Reproductive/Obstetrics                             Anesthesia Physical Anesthesia Plan  ASA: II  Anesthesia Plan: General   Post-op Pain Management:    Induction: Intravenous  PONV Risk Score and Plan: Ondansetron and Treatment may vary due to age or medical condition  Airway Management Planned: LMA  Additional Equipment:   Intra-op Plan:   Post-operative Plan: Extubation in OR  Informed Consent: I have reviewed the patients History and Physical, chart, labs and discussed the procedure including the risks, benefits and alternatives for the proposed anesthesia with the patient or authorized representative who has indicated his/her understanding and acceptance.     Dental Advisory Given  Plan Discussed with: CRNA  Anesthesia Plan Comments:         Anesthesia Quick Evaluation

## 2018-05-27 NOTE — OR Nursing (Signed)
UDS positive for Cocaine.  Surgery cancelled per Dr Hampton Abbot. Dr Lavone Neri aware.  IV discont.. Dr Hampton Abbot in to talk to patient.

## 2018-06-02 ENCOUNTER — Telehealth: Payer: Self-pay | Admitting: *Deleted

## 2018-06-02 NOTE — Addendum Note (Signed)
Addended by: Riki Sheer on: 06/02/2018 01:04 PM   Modules accepted: Orders, SmartSet

## 2018-06-02 NOTE — Telephone Encounter (Signed)
Patient contacted today and agreeable to proceeding with surgery on 06-08-18.  The patient is aware that drug screen will be repeated day of surgery and to avoid in the meantime.

## 2018-06-02 NOTE — Telephone Encounter (Signed)
-----   Message from Olean Ree, MD sent at 05/27/2018  2:46 PM EDT ----- Regarding: reschedule case Hi,  Was supposed to do incisional hernia repair on this patient today but he tested positive for cocaine.  Case was canceled but can we reschedule him for 06/08/18?  Thanks!  [employer]

## 2018-06-04 ENCOUNTER — Telehealth: Payer: Self-pay | Admitting: *Deleted

## 2018-06-04 DIAGNOSIS — R1310 Dysphagia, unspecified: Secondary | ICD-10-CM

## 2018-06-04 DIAGNOSIS — R1319 Other dysphagia: Secondary | ICD-10-CM

## 2018-06-04 NOTE — Telephone Encounter (Signed)
Dr. Hampton Abbot did discuss this case with Dr. Dahlia Byes and they both feel surgery can be postponed.   The hernia that patient has does not involve the intestine and should not affect swallowing or bowel function. If he has symptoms of food getting stuck, that would not be a surgery issue but a possible referral to GI.   Patient wishes to proceed with GI referral.   The patient will be contacted once COVID-19 restrictions have been lifted to reschedule incisional ventral hernia repair.

## 2018-06-04 NOTE — Telephone Encounter (Signed)
Patient called the office back and notified per previous message.   The patient states he is having increased pain and that his food gets stuck when he eats. He states he doesn't think he can wait to have surgery rescheduled.   Note to Dr. Hampton Abbot to see if we can leave on the schedule for Monday surgery (06-08-18).

## 2018-06-04 NOTE — Telephone Encounter (Signed)
Message left for patient to call the office.   Due to recent COVID-19 restrictions, elective surgeries will need to be postponed. Surgery for 06-08-18 with Dr. Hampton Abbot will need to be cancelled.   The patient will be contacted once restrictions have been lifted regarding rescheduling.

## 2018-06-08 ENCOUNTER — Ambulatory Visit: Admission: RE | Admit: 2018-06-08 | Payer: Self-pay | Source: Home / Self Care | Admitting: Surgery

## 2018-06-08 ENCOUNTER — Encounter: Admission: RE | Payer: Self-pay | Source: Home / Self Care

## 2018-06-08 SURGERY — REPAIR, HERNIA, VENTRAL
Anesthesia: General

## 2018-06-27 NOTE — Telephone Encounter (Signed)
error 

## 2018-06-30 ENCOUNTER — Other Ambulatory Visit: Payer: Self-pay | Admitting: Family Medicine

## 2018-07-01 ENCOUNTER — Encounter: Payer: Self-pay | Admitting: Surgery

## 2018-07-24 ENCOUNTER — Encounter: Payer: Self-pay | Admitting: Emergency Medicine

## 2018-07-24 ENCOUNTER — Other Ambulatory Visit: Payer: Self-pay

## 2018-07-24 ENCOUNTER — Ambulatory Visit (INDEPENDENT_AMBULATORY_CARE_PROVIDER_SITE_OTHER): Payer: Self-pay

## 2018-07-24 ENCOUNTER — Ambulatory Visit
Admission: EM | Admit: 2018-07-24 | Discharge: 2018-07-24 | Disposition: A | Discharge status: Home / Self Care | Payer: Self-pay | Attending: Physician Assistant | Admitting: Physician Assistant

## 2018-07-24 DIAGNOSIS — M25531 Pain in right wrist: Secondary | ICD-10-CM

## 2018-07-24 IMAGING — DX RIGHT WRIST - COMPLETE 3+ VIEW
4 series · 4 of 4 positions shown · non-contrast
Comparison: None.

CLINICAL DATA: Pain after falling 2 days ago.  Ulnar side symptoms.

EXAM:
RIGHT WRIST - COMPLETE 3+ VIEW

[wrist pa (1 of 3)]
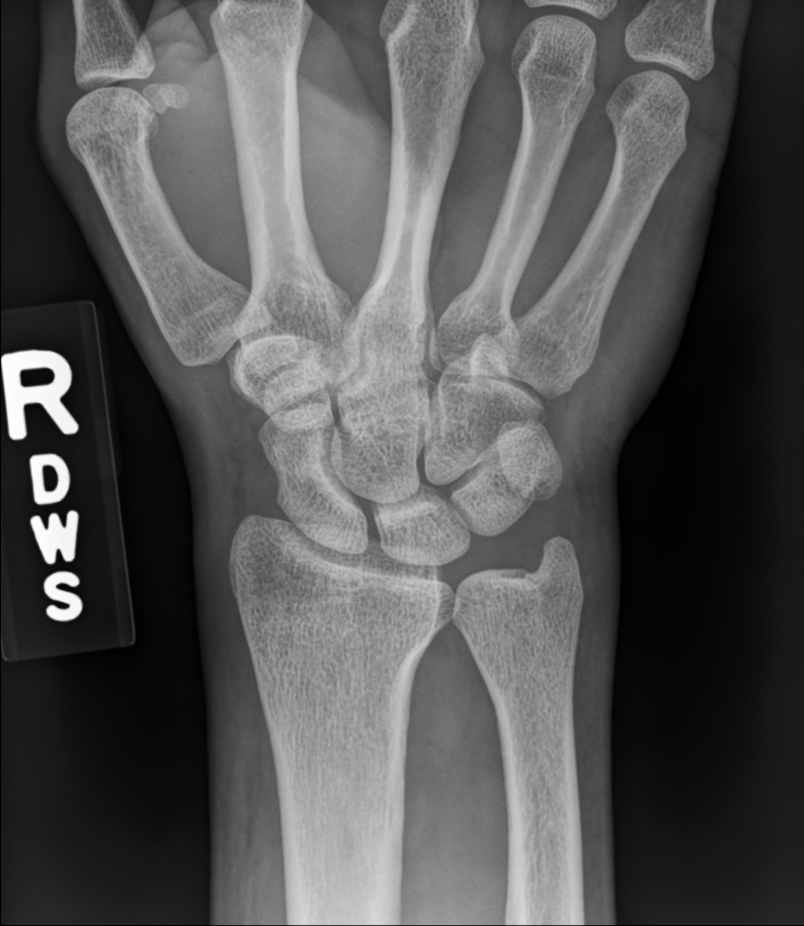

[wrist pa (2 of 3)]
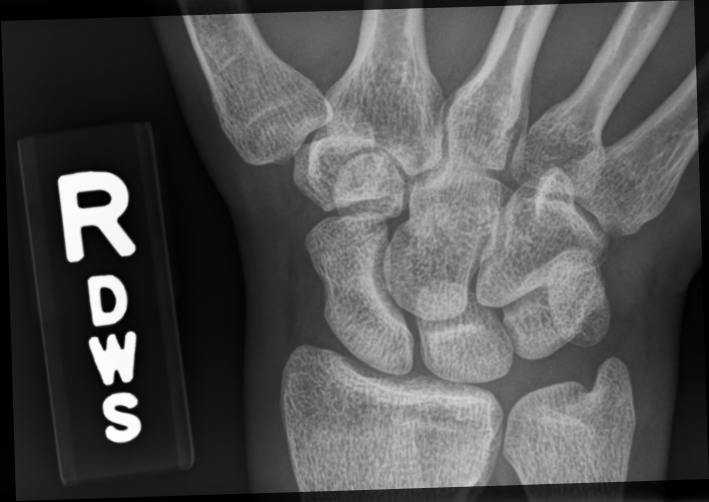

[wrist pa (3 of 3)]
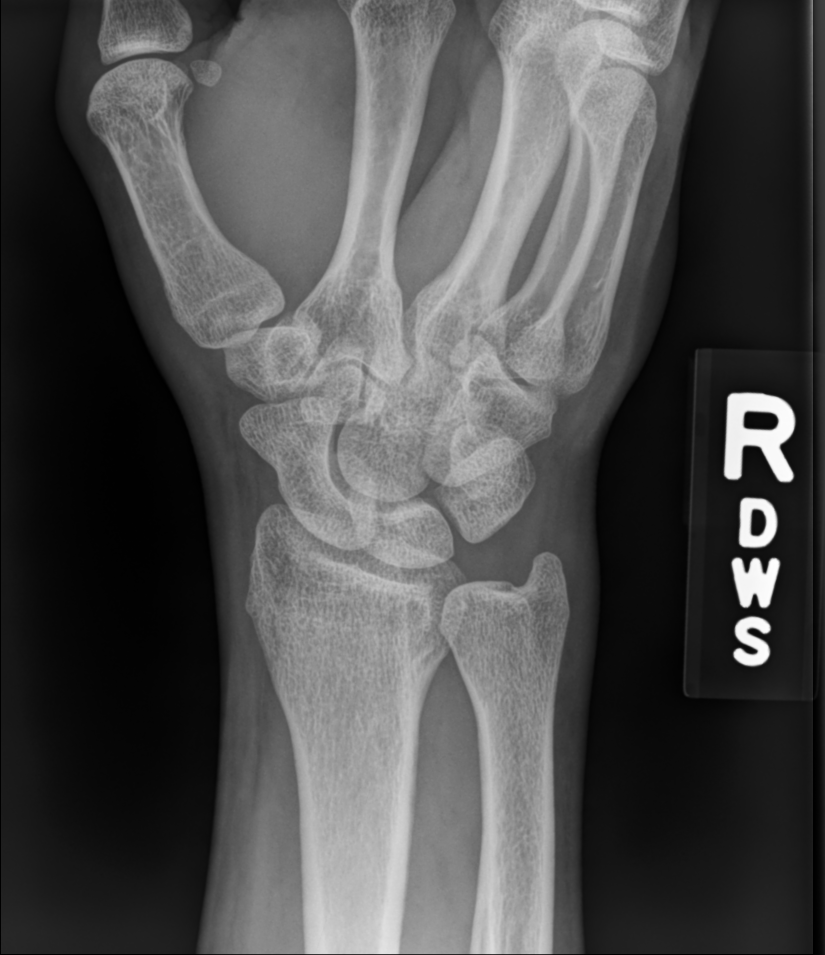

[wrist lat]
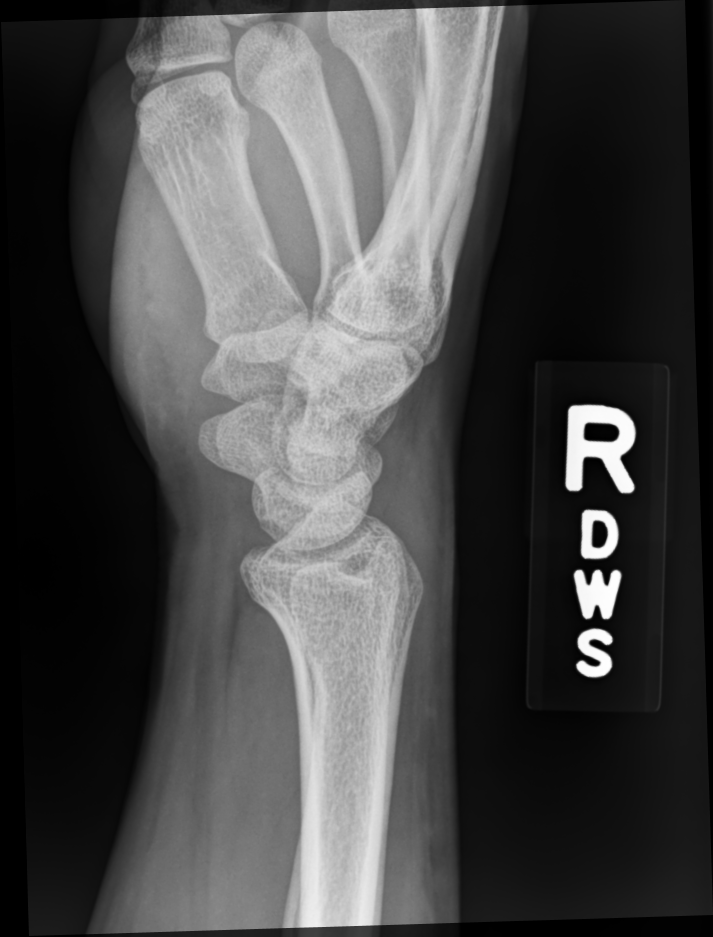

[4 of 4 positions shown; findings below may reference images not displayed]

FINDINGS: There is no evidence of fracture or dislocation. There is no
evidence of arthropathy or other focal bone abnormality. Soft
tissues are unremarkable.
IMPRESSION: Negative.

## 2018-07-24 MED ORDER — MELOXICAM 7.5 MG PO TABS: 7.5000 mg | ORAL_TABLET | Freq: Every day | ORAL | 0 refills | Status: DC

## 2018-07-24 NOTE — Discharge Instructions (Addendum)
Xray negative for fracture or dislocation. Start Mobic. Do not take ibuprofen (motrin/advil)/ naproxen (aleve) while on mobic. Ice compress, wrist splint during activity. This may take a few weeks to completely resolve, but should be feeling better each week. Follow up orthopedics for further evaluation if symptoms not improving.

## 2018-07-24 NOTE — ED Notes (Signed)
Patient able to ambulate independently  

## 2018-07-24 NOTE — ED Triage Notes (Signed)
Pt presents to [facility] for assessment after a fall 2 days ago with right wrist pain.  ALso has two lumps on his left arm he has noted for 2 months now.

## 2018-07-24 NOTE — ED Provider Notes (Signed)
[facility]    CSN: [id number] Arrival date & time: 07/24/18  1033     History   Chief Complaint Chief Complaint  Patient presents with  . Fall  . Wrist Pain    HPI Jimmy [name] is a 28 y.o. male.   28 year old male comes in for right wrist pain after fall 2 days ago. States was playing with his dog, fell backward and caught his fall with his right hand reaching backwards. Denies swelling, erythema. States pain is diffusely around the right wrist without radiation of pain. Pain improves with flexion of the wrist. Has intermittent numbness/tingling. Has not taken anything for the symptoms.  States also has two cysts on the left arm that he has noted for the past 2 months. Mild pain with palpation. Denies increasing in size. Denies erythema, warmth. Denies loss of grip strength.      Past Medical History:  Diagnosis Date  . ADHD (attention deficit hyperactivity disorder)   . Anxiety   . Arthritis   . GERD (gastroesophageal reflux disease)    no meds  . Migraine    migraines due to pinched nerve  . Pinched nerve in neck   . Scoliosis     Patient Active Problem List   Diagnosis Date Noted  . Incisional hernia, without obstruction or gangrene 05/20/2018  . Opiate dependence ([place]) 08/02/2013    Past Surgical History:  Procedure Laterality Date  . ESOPHAGUS SURGERY         Home Medications    Prior to Admission medications   Medication Sig Start Date End Date Taking? Authorizing Provider  docusate sodium (COLACE) 50 MG capsule Take 1 capsule (50 mg total) by mouth 2 (two) times daily. Patient not taking: Reported on 05/25/2018 03/17/18   Scot Jun, FNP  meloxicam (MOBIC) 7.5 MG tablet Take 1 tablet (7.5 mg total) by mouth daily. 07/24/18   Tasia Catchings, Erine Phenix V, PA-C  QUEtiapine (SEROQUEL) 100 MG tablet TAKE 1 TABLET BY MOUTH AT BEDTIME 07/01/18   Scot Jun, FNP    Family History Family History  Problem Relation Age of Onset  . Diabetes  Mother   . Diabetes Maternal Grandmother   . Cancer Maternal Grandmother        unknown type. doing chemo  . Cancer Maternal Grandfather   . Diabetes Maternal Aunt     Social History Social History   Tobacco Use  . Smoking status: Current Every Day Smoker    Packs/day: 1.00    Years: 15.00    Pack years: 15.00  . Smokeless tobacco: Current User  Substance Use Topics  . Alcohol use: No  . Drug use: Not Currently    Types: Cocaine    Comment: Opioids-uds + in 2015 for cocaine     Allergies   Cyclobenzaprine; Depakote [divalproex sodium]; Naproxen; Ritalin [methylphenidate hcl]; and Tramadol   Review of Systems Review of Systems  Reason unable to perform ROS: See HPI as above.     Physical Exam Triage Vital Signs ED Triage Vitals  Enc Vitals Group     BP 07/24/18 1038 123/85     Pulse Rate 07/24/18 1038 76     Resp 07/24/18 1038 18     Temp 07/24/18 1038 97.9 F (36.6 C)     Temp Source 07/24/18 1038 Oral     SpO2 07/24/18 1038 97 %     Weight --      Height --  Head Circumference --      Peak Flow --      Pain Score 07/24/18 1039 9     Pain Loc --      Pain Edu? --      Excl. in [place]? --    No data found.  Updated Vital Signs BP 123/85 (BP Location: Left Arm)   Pulse 76   Temp 97.9 F (36.6 C) (Oral)   Resp 18   SpO2 97%   Physical Exam Constitutional:      General: He is not in acute distress.    Appearance: He is well-developed. He is not diaphoretic.  HENT:     Head: Normocephalic and atraumatic.  Eyes:     Conjunctiva/sclera: Conjunctivae normal.     Pupils: Pupils are equal, round, and reactive to light.  Musculoskeletal:       Arms:     Comments: No erythema, warmth, contusion, swelling. Diffuse tenderness to palpation of the wrist. Decreased extension. Decreased strength. Sensation intact and equal bilaterally. Radial pulse 2+, cap refill <2s   Neurological:     Mental Status: He is alert and oriented to person, place, and time.       UC Treatments / Results  Labs (all labs ordered are listed, but only abnormal results are displayed) Labs Reviewed - No data to display  EKG None  Radiology Dg Wrist Complete Right  Result Date: 07/24/2018 CLINICAL DATA:  Pain after falling 2 days ago.  Ulnar side symptoms. EXAM: RIGHT WRIST - COMPLETE 3+ VIEW COMPARISON:  None. FINDINGS: There is no evidence of fracture or dislocation. There is no evidence of arthropathy or other focal bone abnormality. Soft tissues are unremarkable. IMPRESSION: Negative. Electronically Signed   By: Nelson Chimes M.D.   On: 07/24/2018 11:06    Procedures Procedures (including critical care time)  Medications Ordered in UC Medications - No data to display  Initial Impression / Assessment and Plan / UC Course  I have reviewed the triage vital signs and the nursing notes.  Pertinent labs & imaging results that were available during my care of the patient were reviewed by me and considered in my medical decision making (see chart for details).    Xray negative for fracture or dislocation. NSAIDs, ice compress, wrist splint during activity. Naproxen listed as allergy, however, more of side effect with nausea/acid reflux, will use mobic and have patient take with food. Return precautions given.  Will have patient monitor cyst, can follow up with orthopedics if size is increase, and changes in grip strength/ROM due to cyst. Patient expresses understanding and agrees to plan.  Final Clinical Impressions(s) / UC Diagnoses   Final diagnoses:  Right wrist pain    ED Prescriptions    Medication Sig Dispense Auth. Provider   meloxicam (MOBIC) 7.5 MG tablet Take 1 tablet (7.5 mg total) by mouth daily. 10 tablet Tobin Chad, [place] 07/24/18 1221

## 2018-07-28 ENCOUNTER — Encounter: Payer: Self-pay | Admitting: Gastroenterology

## 2018-07-28 ENCOUNTER — Ambulatory Visit: Payer: Self-pay | Admitting: Gastroenterology

## 2018-07-28 ENCOUNTER — Telehealth: Payer: Self-pay

## 2018-07-28 NOTE — Telephone Encounter (Signed)
Called pt to pre-chart for today's e-visit with Dr. Anna  Unable to contact. LVM to return call 

## 2018-07-30 ENCOUNTER — Telehealth: Payer: Self-pay | Admitting: *Deleted

## 2018-07-30 NOTE — Telephone Encounter (Signed)
Patient called and wanted to know if he can get a note stating that he is okay to work until he has surgery.

## 2018-07-31 ENCOUNTER — Encounter: Payer: Self-pay | Admitting: *Deleted

## 2018-07-31 NOTE — Telephone Encounter (Signed)
Letter is made and is up at front desk.

## 2018-08-04 ENCOUNTER — Telehealth: Payer: Self-pay | Admitting: *Deleted

## 2018-08-04 ENCOUNTER — Encounter: Payer: Self-pay | Admitting: Family Medicine

## 2018-08-04 ENCOUNTER — Encounter: Payer: Self-pay | Admitting: *Deleted

## 2018-08-04 ENCOUNTER — Encounter: Payer: Self-pay | Admitting: Surgery

## 2018-08-04 ENCOUNTER — Other Ambulatory Visit: Payer: Self-pay

## 2018-08-04 ENCOUNTER — Ambulatory Visit (INDEPENDENT_AMBULATORY_CARE_PROVIDER_SITE_OTHER): Payer: Self-pay | Admitting: Family Medicine

## 2018-08-04 DIAGNOSIS — L089 Local infection of the skin and subcutaneous tissue, unspecified: Secondary | ICD-10-CM

## 2018-08-04 DIAGNOSIS — S81811A Laceration without foreign body, right lower leg, initial encounter: Secondary | ICD-10-CM

## 2018-08-04 MED ORDER — CEPHALEXIN 500 MG PO CAPS: 500.0000 mg | ORAL_CAPSULE | Freq: Three times a day (TID) | ORAL | 0 refills | Status: AC

## 2018-08-04 MED ORDER — MUPIROCIN 2 % EX OINT: 1.0000 "application " | TOPICAL_OINTMENT | Freq: Three times a day (TID) | CUTANEOUS | 0 refills | Status: DC

## 2018-08-04 NOTE — Telephone Encounter (Signed)
Left message for patient to call back  

## 2018-08-04 NOTE — Progress Notes (Signed)
Virtual Visit via Telephone Note  I connected with [name] on 08/04/18 at  3:50 PM EDT by telephone and verified that I am speaking with the correct person using two identifiers.  Location: Patient: Located at home during today's encounter  Provider: Located at primary care office    I discussed the limitations, risks, security and privacy concerns of performing an evaluation and management service by telephone and the availability of in person appointments. I also discussed with the patient that there may be a patient responsible charge related to this service. The patient expressed understanding and agreed to proceed.   History of Present Illness: Hrishikesh reports last week presenting to the ER after sustaining a laceration to his proximal right leg. EMR unavailable for ER visit.  Patient reports that laceration was closed with staples and over the last 2 days he is noticed increased tenderness, warmth, and redness at the site.  [place] reports one of the staple material fell out of the laceration repair yesterday. He denies fever, chills, abdominal pain, nausea, or vomiting, swelling or numbness and tingling at the site. Observations/Objective:           Assessment and Plan: 1. Skin infection -Start Cephalexin 500 TID x 10 days -Apply Mupirocin BID around the exterior boarder of   -Patient at present does not have transportation to [place] he lives in [place].  Patient was provided be address and phone number for new urgent care location in [place].  He was advised to follow-up at the urgent care for wound check in 2 days. 2. Laceration of right lower extremity, initial encounter  Follow Up Instructions: Urgent care 48 hours for wound check   I discussed the assessment and treatment plan with the patient. The patient was provided an opportunity to ask questions and all were answered. The patient agreed with the plan and demonstrated an understanding of the  instructions.   The patient was advised to call back or seek an in-person evaluation if the symptoms worsen or if the condition fails to improve as anticipated.  I provided 20 minutes of non-face-to-face time during this encounter.   Molli Barrows, FNP

## 2018-08-04 NOTE — Progress Notes (Deleted)
Called patient to initiate their telephone visit with provider Molli Barrows, FNP-C. Verified date of birth. Patient went to the ED on 08/02/2018 due to a cut on his leg. Ended up getting 16 stitches. Patient states that the area is warm to the touch & he has noticed some drainage. KWalker, CMA.

## 2018-08-04 NOTE — Telephone Encounter (Signed)
-----   Message from Olean Ree, MD sent at 08/04/2018  1:24 PM EDT ----- Regarding: Follow up Hi Jimmy [name],  [facility] you're doing well!  Got your message. Let's have him come to clinic on Friday so I can examine him and schedule him for surgery in June.   Thanks!  [employer]

## 2018-08-05 ENCOUNTER — Ambulatory Visit: Payer: Self-pay | Admitting: Gastroenterology

## 2018-08-06 ENCOUNTER — Ambulatory Visit (INDEPENDENT_AMBULATORY_CARE_PROVIDER_SITE_OTHER): Payer: Self-pay | Admitting: Gastroenterology

## 2018-08-06 NOTE — Progress Notes (Signed)
No show

## 2018-08-11 ENCOUNTER — Ambulatory Visit
Admission: EM | Admit: 2018-08-11 | Discharge: 2018-08-11 | Disposition: A | Discharge status: Home / Self Care | Payer: Self-pay | Attending: Emergency Medicine | Admitting: Emergency Medicine

## 2018-08-11 ENCOUNTER — Other Ambulatory Visit: Payer: Self-pay

## 2018-08-11 DIAGNOSIS — Z4802 Encounter for removal of sutures: Secondary | ICD-10-CM

## 2018-08-11 DIAGNOSIS — Z5189 Encounter for other specified aftercare: Secondary | ICD-10-CM

## 2018-08-11 MED ORDER — DOXYCYCLINE HYCLATE 100 MG PO CAPS: 100.0000 mg | ORAL_CAPSULE | Freq: Two times a day (BID) | ORAL | 0 refills | Status: DC

## 2018-08-11 NOTE — ED Notes (Signed)
Staples removed per protocol 15 staples removed, skin aproximates well, steri strips applied

## 2018-08-11 NOTE — Discharge Instructions (Signed)
Staples removed Steri strips applied.  Steri strips will fall off on their own Continue keflex until completion.  Begin doxycycline Take OTC ibuprofen or tylenol as needed for pain relief Follow up with Molli Barrows FNP this week or next week for recheck and to ensure your symptoms are improving Return or go to the ED if you have any new or worsening symptoms such as increased pain, redness, swelling, drainage, discharge, decreased range of motion of extremity, etc..

## 2018-08-11 NOTE — ED Provider Notes (Signed)
[facility]   [id number] 08/11/18 Arrival Time: 4944  CC: Wound follow up  SUBJECTIVE:  Jimmy [name] is a 28 y.o. male who presents follow up wound check following a laceration that occurred 9 days ago.  Symptoms began after he fell while intoxicated and cut the outside of his left calf on a piece of glass.  Followed up with PCP Molli Barrows FNP on 08/04/2018 and prescribed bactroban and keflex.  Denies improvement in symptoms, states wound has become more red and painful with purulent drainage over the past weekend.  Denies similar symptoms in the past.  Denies fever, chills, nausea, vomiting, swelling, decrease strength or sensation.   ROS: As per HPI.  Past Medical History:  Diagnosis Date  . ADHD (attention deficit hyperactivity disorder)   . Anxiety   . Arthritis   . GERD (gastroesophageal reflux disease)    no meds  . Migraine    migraines due to pinched nerve  . Pinched nerve in neck   . Scoliosis    Past Surgical History:  Procedure Laterality Date  . ESOPHAGUS SURGERY     Allergies  Allergen Reactions  . Cyclobenzaprine Nausea Only and Hives  . Depakote [Divalproex Sodium] Other (See Comments)    "made him crazy"  . Naproxen Nausea Only    Acid reflux  . Ritalin [Methylphenidate Hcl] Other (See Comments)    Increased hyper activeness   . Tramadol Other (See Comments)    Acid reflux   No current facility-administered medications on file prior to encounter.    Current Outpatient Medications on File Prior to Encounter  Medication Sig Dispense Refill  . cephALEXin (KEFLEX) 500 MG capsule Take 1 capsule (500 mg total) by mouth 3 (three) times daily for 10 days. 30 capsule 0  . mupirocin ointment (BACTROBAN) 2 % Apply 1 application topically 3 (three) times daily. 30 g 0  . QUEtiapine (SEROQUEL) 100 MG tablet TAKE 1 TABLET BY MOUTH AT BEDTIME 30 tablet 2   Social History   Socioeconomic History  . Marital status: Married    Spouse name: Not on  file  . Number of children: Not on file  . Years of education: Not on file  . Highest education level: Not on file  Occupational History  . Not on file  Social Needs  . Financial resource strain: Not on file  . Food insecurity:    Worry: Not on file    Inability: Not on file  . Transportation needs:    Medical: Not on file    Non-medical: Not on file  Tobacco Use  . Smoking status: Current Every Day Smoker    Packs/day: 1.00    Years: 15.00    Pack years: 15.00  . Smokeless tobacco: Current User  Substance and Sexual Activity  . Alcohol use: No  . Drug use: Not Currently    Types: Cocaine    Comment: Opioids-uds + in 2015 for cocaine  . Sexual activity: Yes  Lifestyle  . Physical activity:    Days per week: Not on file    Minutes per session: Not on file  . Stress: Not on file  Relationships  . Social connections:    Talks on phone: Not on file    Gets together: Not on file    Attends religious service: Not on file    Active member of club or organization: Not on file    Attends meetings of clubs or organizations: Not on file  Relationship status: Not on file  . Intimate partner violence:    Fear of current or ex partner: Not on file    Emotionally abused: Not on file    Physically abused: Not on file    Forced sexual activity: Not on file  Other Topics Concern  . Not on file  Social History Narrative  . Not on file   Family History  Problem Relation Age of Onset  . Diabetes Mother   . Diabetes Maternal Grandmother   . Cancer Maternal Grandmother        unknown type. doing chemo  . Cancer Maternal Grandfather   . Diabetes Maternal Aunt      OBJECTIVE:  Vitals:   08/11/18 1132  BP: 133/85  Pulse: 91  Resp: 18  Temp: 98.5 F (36.9 C)  SpO2: 95%     General appearance: alert; no distress CV: RRR Lungs: CTAB Skin: wound closed with staples to left lateral calf; size: approx 7.5 cm; surrounding erythema, mildly TTP, no active bleeding or  drainage (see picture below) Psychological: alert and cooperative; normal mood and affect     ASSESSMENT & PLAN:  1. Visit for wound check   2. Encounter for staple removal     Meds ordered this encounter  Medications  . doxycycline (VIBRAMYCIN) 100 MG capsule    Sig: Take 1 capsule (100 mg total) by mouth 2 (two) times daily.    Dispense:  20 capsule    Refill:  0    Order Specific Question:   Supervising Provider    Answer:   Raylene Everts [[id number]]   Staples removed Steri strips applied.  Steri strips will fall off on their own Continue keflex until completion.  Begin doxycycline Take OTC ibuprofen or tylenol as needed for pain relief Follow up with Molli Barrows FNP this week or next week for recheck and to ensure your symptoms are improving Return or go to the ED if you have any new or worsening symptoms such as increased pain, redness, swelling, drainage, discharge, decreased range of motion of extremity, etc..    Reviewed expectations re: course of current medical issues. Questions answered. Outlined signs and symptoms indicating need for more acute intervention. Patient verbalized understanding. After Visit Summary given.   Lestine Box, PA-C 08/11/18 1213

## 2018-08-11 NOTE — ED Triage Notes (Signed)
Pt has laceration that was stapled last Saturday, wound edges are red and painful, skin looks well approximated

## 2018-08-17 DIAGNOSIS — R079 Chest pain, unspecified: Secondary | ICD-10-CM

## 2018-08-17 HISTORY — DX: Chest pain, unspecified: R07.9

## 2018-08-18 ENCOUNTER — Encounter: Payer: Self-pay | Admitting: Gastroenterology

## 2018-08-18 ENCOUNTER — Ambulatory Visit: Payer: Self-pay | Admitting: Gastroenterology

## 2018-08-20 ENCOUNTER — Telehealth: Payer: Self-pay | Admitting: Gastroenterology

## 2018-08-20 ENCOUNTER — Encounter: Payer: Self-pay | Admitting: Gastroenterology

## 2018-08-20 NOTE — Telephone Encounter (Signed)
A lady called in to make an appointment for DR Vicente Males. We were told earlier in the week not to schedule an appointment for patient because he was being dismissed for missing 3 consecutive appts. I ask to speak with the patient & he came on the phone. I explained he was being dismissed from thr practice for missing the appts. There for I was unable to schedule an appt for him. He stated that was fine he was ok. But if something happened to him he would sue the hospital & it would be our fault.

## 2018-08-21 ENCOUNTER — Encounter: Payer: Self-pay | Admitting: Surgery

## 2018-08-21 NOTE — Telephone Encounter (Signed)
Patient's surgery to be scheduled for 08/31/18 at [facility]  with Dr. Hampton Abbot.  The patient is aware to call the day before to get their arrival time between 1-3 pm at [phone].  The patient is aware to have COVID-19 testing done on 08/27/18 at the [facility] building drive thru ([address] [place]) between 10:30 am and 12:30 pm. Heis aware to isolate after, have no visitors, wash hands frequently, and avoid touching face.   The patient is aware he will be contacted by the [facility] to complete a phone interview sometime in the near future.  Patient aware to be NPO after midnight and have a driver.   He is aware to check in at the [facility] entrance where he will be screened for the coronavirus and then sent to Same Day Surgery.   Patient aware that he may have no visitors and driver will need to wait in the car due to COVID-19 restrictions.   The patient verbalizes understanding of the above.   The patient is aware to call the office should he have further questions.

## 2018-08-21 NOTE — Telephone Encounter (Signed)
Jody multiple no shows

## 2018-08-24 ENCOUNTER — Ambulatory Visit: Payer: Self-pay | Admitting: Gastroenterology

## 2018-08-26 ENCOUNTER — Encounter
Admission: RE | Admit: 2018-08-26 | Discharge: 2018-08-26 | Disposition: A | Discharge status: Home / Self Care | Payer: Self-pay | Source: Ambulatory Visit | Attending: Surgery | Admitting: Surgery

## 2018-08-26 ENCOUNTER — Other Ambulatory Visit: Payer: Self-pay

## 2018-08-26 ENCOUNTER — Telehealth: Payer: Self-pay | Admitting: *Deleted

## 2018-08-26 ENCOUNTER — Ambulatory Visit
Admission: EM | Admit: 2018-08-26 | Discharge: 2018-08-26 | Disposition: A | Discharge status: Home / Self Care | Payer: Self-pay | Attending: Family Medicine | Admitting: Family Medicine

## 2018-08-26 DIAGNOSIS — K219 Gastro-esophageal reflux disease without esophagitis: Secondary | ICD-10-CM

## 2018-08-26 DIAGNOSIS — Z1159 Encounter for screening for other viral diseases: Secondary | ICD-10-CM | POA: Insufficient documentation

## 2018-08-26 DIAGNOSIS — Z01812 Encounter for preprocedural laboratory examination: Secondary | ICD-10-CM | POA: Insufficient documentation

## 2018-08-26 DIAGNOSIS — K4090 Unilateral inguinal hernia, without obstruction or gangrene, not specified as recurrent: Secondary | ICD-10-CM

## 2018-08-26 HISTORY — DX: Malignant (primary) neoplasm, unspecified: C80.1

## 2018-08-26 MED ORDER — FAMOTIDINE 40 MG PO TABS: 40.0000 mg | ORAL_TABLET | Freq: Every day | ORAL | 1 refills | Status: DC

## 2018-08-26 MED ORDER — QUETIAPINE FUMARATE 100 MG PO TABS: 100.0000 mg | ORAL_TABLET | Freq: Every day | ORAL | 1 refills | Status: DC

## 2018-08-26 NOTE — Pre-Procedure Instructions (Signed)
MEDICAL CLEARANCE RECEIVED AND ON CHART

## 2018-08-26 NOTE — Telephone Encounter (Signed)
Patient called the office and states that his PCP office is only open Monday thru Wednesday and they cannot see patient today for medical clearance.  Patient thinks his recent chest pain is related to the amount of smoking he has been doing.    The patient states he was told by his PCP office to go to the urgent care to be seen and have an EKG since they are unable to see him in the office prior to surgery.   I did call the Pre-admission Testing Department after I spoke with the patient to see if this would meet anesthesia guidelines. Per Judeen Hammans with Anesthesia, if patient can get an EKG done and a note stating he is cleared for surgery from Urgent Care that this would be acceptable and we could proceed with surgery as scheduled for 08-31-18.   Patient called back and notified of above. He states he is going to request that Urgent Care do an x-ray as well.   Also, patient instructed to make sure he self isolates after COVID testing tomorrow until surgery. He verbalizes understanding.

## 2018-08-26 NOTE — ED Provider Notes (Signed)
[facility]    CSN: [id number] Arrival date & time: 08/26/18  1145     History   Chief Complaint Chief Complaint  Patient presents with  . Chest Pain    HPI Jimmy [name] is a 28 y.o. male.   Pt c/o rt to center chest pain since Monday, feels like his acid reflux. Pt states has hernia surgery on Monday and needs clearance from chest pain to have surgery.   Pain has resolved.  He did not take any medicine.  Patient is a smoker and was advised to stop smoking.  He will see his PCP about something to help him quit.  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     Past Medical History:  Diagnosis Date  . ADHD (attention deficit hyperactivity disorder)   . Anxiety   . Arthritis   . Cancer ([place])    skin cancer  . Chest pain 08/2018  . GERD (gastroesophageal reflux disease)    no meds  . Migraine    migraines due to pinched nerve  . Pinched nerve in neck   . Pinched nerve in neck   . Scoliosis     Patient Active Problem List   Diagnosis Date Noted  . Incisional hernia, without obstruction or gangrene 05/20/2018  . Opiate dependence ([place]) 08/02/2013    Past Surgical History:  Procedure Laterality Date  . ESOPHAGUS SURGERY     as a baby       Home Medications    Prior to Admission medications   Medication Sig Start Date End Date Taking? Authorizing Provider  famotidine (PEPCID) 40 MG tablet Take 1 tablet (40 mg total) by mouth daily. 08/26/18   Robyn Haber, MD  QUEtiapine (SEROQUEL) 100 MG tablet Take 1 tablet (100 mg total) by mouth at bedtime. 08/26/18   Robyn Haber, MD  Throat Lozenges (RA VITAMIN C COUGH DROPS MT) Use as directed 1 lozenge in the mouth or throat as needed.    [provider]    Family History Family History  Problem Relation Age of Onset  . Diabetes Mother   . Diabetes Maternal Grandmother   . Cancer Maternal  Grandmother        unknown type. doing chemo  . Cancer Maternal Grandfather   . Diabetes Maternal Aunt     Social History Social History   Tobacco Use  . Smoking status: Current Every Day Smoker    Packs/day: 1.00    Years: 15.00    Pack years: 15.00    Types: Cigarettes  . Smokeless tobacco: Current User  Substance Use Topics  . Alcohol use: Yes    Comment: occ  . Drug use: Not Currently    Types: Cocaine, Marijuana     Allergies   Cyclobenzaprine; Depakote [divalproex sodium]; Naproxen; Other; Ritalin [methylphenidate hcl]; and Tramadol   Review of Systems Review of Systems   Physical Exam Triage Vital Signs ED Triage Vitals [08/26/18 1216]  Enc Vitals Group     BP 132/83     Pulse Rate 82     Resp 18     Temp 97.9 F (36.6 C)     Temp src      SpO2 99 %     Weight      Height      Head Circumference      Peak Flow      Pain Score 0  Pain Loc      Pain Edu?      Excl. in [place]?    No data found.  Updated Vital Signs BP 132/83 (BP Location: Right Arm)   Pulse 82   Temp 97.9 F (36.6 C)   Resp 18   SpO2 99%    Physical Exam Vitals signs and nursing note reviewed.  Constitutional:      Appearance: He is well-developed and normal weight.  HENT:     Head: Normocephalic.  Neck:     Musculoskeletal: Normal range of motion and neck supple.  Cardiovascular:     Rate and Rhythm: Normal rate and regular rhythm.     Heart sounds: Normal heart sounds.  Pulmonary:     Effort: Pulmonary effort is normal.     Breath sounds: Normal breath sounds.  Abdominal:     Palpations: Abdomen is soft.     Tenderness: There is no abdominal tenderness.  Musculoskeletal: Normal range of motion.  Skin:    General: Skin is warm and dry.  Neurological:     General: No focal deficit present.     Mental Status: He is alert.  Psychiatric:        Mood and Affect: Mood normal.        Behavior: Behavior normal.      UC Treatments / Results  Labs (all labs  ordered are listed, but only abnormal results are displayed) Labs Reviewed - No data to display  EKG 12 lead EKG: normal  Radiology No results found.  Procedures Procedures (including critical care time)  Medications Ordered in UC Medications - No data to display  Initial Impression / Assessment and Plan / UC Course  I have reviewed the triage vital signs and the nursing notes.  Pertinent labs & imaging results that were available during my care of the patient were reviewed by me and considered in my medical decision making (see chart for details).    Final Clinical Impressions(s) / UC Diagnoses   Final diagnoses:  Gastroesophageal reflux disease, esophagitis presence not specified  Non-recurrent inguinal hernia without obstruction or gangrene, unspecified laterality     Discharge Instructions     Normal electrocardiogram (EKG)    ED Prescriptions    Medication Sig Dispense Auth. Provider   QUEtiapine (SEROQUEL) 100 MG tablet Take 1 tablet (100 mg total) by mouth at bedtime. 30 tablet Robyn Haber, MD   famotidine (PEPCID) 40 MG tablet Take 1 tablet (40 mg total) by mouth daily. 90 tablet Robyn Haber, MD     Controlled Substance Prescriptions [place] Controlled Substance Registry consulted? Not Applicable   Robyn Haber, MD 08/26/18 1229

## 2018-08-26 NOTE — Patient Instructions (Signed)
Your procedure is scheduled on: 08-31-18 MONDAY Report to Same Day Surgery 2nd floor medical mall [facility] Entrance-take elevator on left to 2nd floor.  Check in with surgery information desk.) To find out your arrival time please call [phone] between 1PM - 3PM on 08-28-18   Remember: Instructions that are not followed completely may result in serious medical risk, up to and including death, or upon the discretion of your surgeon and anesthesiologist your surgery may need to be rescheduled.    _x___ 1. Do not eat food after midnight the night before your procedure. NO GUM OR CANDY AFTER MIDNIGHT. You may drink clear liquids up to 2 hours before you are scheduled to arrive at the hospital for your procedure.  Do not drink clear liquids within 2 hours of your scheduled arrival to the hospital.  Clear liquids include  --Water or Apple juice without pulp  --Clear carbohydrate beverage such as ClearFast or Gatorade  --Black Coffee or Clear Tea (No milk, no creamers, do not add anything to the coffee or Te   ____Ensure clear carbohydrate drink on the way to the hospital for bariatric patients  ____Ensure clear carbohydrate drink 3 hours before surgery for Dr Dwyane Luo patients if physician instructed.     __x__ 2. No Alcohol for 24 hours before or after surgery.   __x__3. No Smoking or e-cigarettes for 24 prior to surgery.  Do not use any chewable tobacco products for at least 6 hour prior to surgery   ____  4. Bring all medications with you on the day of surgery if instructed.    __x__ 5. Notify your doctor if there is any change in your medical condition     (cold, fever, infections).    x___6. On the morning of surgery brush your teeth with toothpaste and water.  You may rinse your mouth with mouth wash if you wish.  Do not swallow any toothpaste or mouthwash.   Do not wear jewelry, make-up, hairpins, clips or nail polish.  Do not wear lotions, powders, or perfumes. You may  wear deodorant.  Do not shave 48 hours prior to surgery. Men may shave face and neck.  Do not bring valuables to the hospital.    [facility] is not responsible for any belongings or valuables.               Contacts, dentures or bridgework may not be worn into surgery.  Leave your suitcase in the car. After surgery it may be brought to your room.  For patients admitted to the hospital, discharge time is determined by your treatment team.  _  Patients discharged the day of surgery will not be allowed to drive home.  You will need someone to drive you home and stay with you the night of your procedure.    Please read over the following fact sheets that you were given:   [facility] Preparing for Surgery  ____ Take anti-hypertensive listed below, cardiac, seizure, asthma, anti-reflux and psychiatric medicines. These include:  1. NONE  2.  3.  4.  5.  6.  ____Fleets enema or Magnesium Citrate as directed.   _x___ Use CHG Soap or sage wipes as directed on instruction sheet   ____ Use inhalers on the day of surgery and bring to hospital day of surgery  ____ Stop Metformin and Janumet 2 days prior to surgery.    ____ Take 1/2 of usual insulin dose the night before surgery and none on the  morning surgery.   ____ Follow recommendations from Cardiologist, Pulmonologist or PCP regarding stopping Aspirin, Coumadin, Plavix ,Eliquis, Effient, or Pradaxa, and Pletal.  X____Stop Anti-inflammatories such as Advil, Aleve, Ibuprofen, Motrin, Naproxen, Naprosyn, Goodies powders or aspirin products NOW- OK to take Tylenol   ____ Stop supplements until after surgery.     ____ Bring C-Pap to the hospital.

## 2018-08-26 NOTE — Pre-Procedure Instructions (Signed)
Messaged Dr Amie Critchley regarding pt having recent chest pain and has been + in the past for cocaine. Dr Amie Critchley wanting medical clearance. Called Selinda Eon at Dr Abelardo Diesel office and informed her of this and will fax clearance to Dr Hampton Abbot and to pt's PCP Lavell Anchors

## 2018-08-26 NOTE — Discharge Instructions (Addendum)
Normal electrocardiogram (EKG)

## 2018-08-26 NOTE — Telephone Encounter (Signed)
Per Nira Conn in the [place], she did call patient to complete a phone interview today.   Patient reports recent chest pain with a history of cocaine use.   Per Dr. Theodore Demark (anesthesiologist), patient will need medical clearance prior to surgery on 08-31-18. Patient sees Lavell Anchors, FNP in [place].   Nira Conn will be faxing medical clearance request to our office and to Lavell Anchors, [place].

## 2018-08-26 NOTE — ED Triage Notes (Signed)
Pt c/o rt to center chest pain since Monday, feels like his acid reflux. Pt states has hernia surgery on Monday and needs clearance from chest pain to have surgery.

## 2018-08-27 ENCOUNTER — Other Ambulatory Visit: Payer: Self-pay

## 2018-08-27 ENCOUNTER — Other Ambulatory Visit
Admission: RE | Admit: 2018-08-27 | Discharge: 2018-08-27 | Disposition: A | Discharge status: Home / Self Care | Payer: Self-pay | Source: Ambulatory Visit | Attending: Surgery | Admitting: Surgery

## 2018-08-27 NOTE — Telephone Encounter (Signed)
Clearance received from [facility]. Patient aware.   Copy of clearance and EKG was faxed to the Pre-admission Testing Department and received per [facility].   Surgery as scheduled for 08-31-18 at [facility] with Dr. Hampton Abbot.

## 2018-08-28 ENCOUNTER — Other Ambulatory Visit: Payer: Self-pay

## 2018-08-28 ENCOUNTER — Encounter: Payer: Self-pay | Admitting: Anesthesiology

## 2018-08-28 LAB — NOVEL CORONAVIRUS, NAA (HOSP ORDER, SEND-OUT TO REF LAB; TAT 18-24 HRS): SARS-CoV-2, NAA: NOT DETECTED

## 2018-08-28 NOTE — Pre-Procedure Instructions (Signed)
MEDICAL CLEARANCE RECEIVED AND IS ON CHART

## 2018-08-31 ENCOUNTER — Ambulatory Visit
Admission: RE | Admit: 2018-08-31 | Discharge: 2018-08-31 | Disposition: A | Discharge status: Home / Self Care | Payer: Self-pay | Attending: Surgery | Admitting: Surgery

## 2018-08-31 ENCOUNTER — Ambulatory Visit: Payer: Self-pay | Admitting: Anesthesiology

## 2018-08-31 ENCOUNTER — Encounter
Admission: RE | Disposition: A | Discharge status: Home / Self Care | Payer: Self-pay | Source: Home / Self Care | Attending: Surgery

## 2018-08-31 ENCOUNTER — Encounter: Payer: Self-pay | Admitting: *Deleted

## 2018-08-31 DIAGNOSIS — K432 Incisional hernia without obstruction or gangrene: Secondary | ICD-10-CM

## 2018-08-31 DIAGNOSIS — F17210 Nicotine dependence, cigarettes, uncomplicated: Secondary | ICD-10-CM | POA: Insufficient documentation

## 2018-08-31 DIAGNOSIS — Z79899 Other long term (current) drug therapy: Secondary | ICD-10-CM | POA: Insufficient documentation

## 2018-08-31 HISTORY — PX: VENTRAL HERNIA REPAIR: SHX424

## 2018-08-31 LAB — URINE DRUG SCREEN, QUALITATIVE (ARMC ONLY)
Amphetamines, Ur Screen: NOT DETECTED
Barbiturates, Ur Screen: NOT DETECTED
Benzodiazepine, Ur Scrn: NOT DETECTED
Cannabinoid 50 Ng, Ur ~~LOC~~: NOT DETECTED
Cocaine Metabolite,Ur ~~LOC~~: NOT DETECTED
MDMA (Ecstasy)Ur Screen: NOT DETECTED
Methadone Scn, Ur: NOT DETECTED
Opiate, Ur Screen: NOT DETECTED
Phencyclidine (PCP) Ur S: NOT DETECTED
Tricyclic, Ur Screen: NOT DETECTED

## 2018-08-31 SURGERY — REPAIR, HERNIA, VENTRAL
Anesthesia: General

## 2018-08-31 MED ORDER — PROPOFOL 10 MG/ML IV BOLUS
INTRAVENOUS | Status: AC
  Filled 2018-08-31: qty 20

## 2018-08-31 MED ORDER — PROPOFOL 10 MG/ML IV BOLUS
INTRAVENOUS | Status: DC | PRN
  Administered 2018-08-31: 150 mg via INTRAVENOUS
  Administered 2018-08-31: 40 mg via INTRAVENOUS

## 2018-08-31 MED ORDER — OXYCODONE HCL 5 MG PO TABS: 5.0000 mg | ORAL_TABLET | Freq: Four times a day (QID) | ORAL | 0 refills | Status: DC | PRN

## 2018-08-31 MED ORDER — LACTATED RINGERS IV SOLN
INTRAVENOUS | Status: DC
  Administered 2018-08-31: 09:00:00 via INTRAVENOUS

## 2018-08-31 MED ORDER — GABAPENTIN 300 MG PO CAPS
ORAL_CAPSULE | ORAL | Status: AC
  Filled 2018-08-31: qty 1

## 2018-08-31 MED ORDER — ONDANSETRON HCL 4 MG/2ML IJ SOLN
INTRAMUSCULAR | Status: DC | PRN
  Administered 2018-08-31: 4 mg via INTRAVENOUS

## 2018-08-31 MED ORDER — FENTANYL CITRATE (PF) 100 MCG/2ML IJ SOLN
INTRAMUSCULAR | Status: AC
  Administered 2018-08-31: 25 ug via INTRAVENOUS
  Filled 2018-08-31: qty 2

## 2018-08-31 MED ORDER — BUPIVACAINE LIPOSOME 1.3 % IJ SUSP
INTRAMUSCULAR | Status: DC | PRN
  Administered 2018-08-31: 20 mL

## 2018-08-31 MED ORDER — MIDAZOLAM HCL 2 MG/2ML IJ SOLN
INTRAMUSCULAR | Status: DC | PRN
  Administered 2018-08-31: 2 mg via INTRAVENOUS

## 2018-08-31 MED ORDER — FAMOTIDINE 20 MG PO TABS
ORAL_TABLET | ORAL | Status: AC
  Filled 2018-08-31: qty 1

## 2018-08-31 MED ORDER — CEFAZOLIN SODIUM-DEXTROSE 2-4 GM/100ML-% IV SOLN
INTRAVENOUS | Status: AC
  Filled 2018-08-31: qty 100

## 2018-08-31 MED ORDER — OXYCODONE HCL 5 MG PO TABS
ORAL_TABLET | ORAL | Status: AC
  Administered 2018-08-31: 5 mg via ORAL
  Filled 2018-08-31: qty 1

## 2018-08-31 MED ORDER — ACETAMINOPHEN 500 MG PO TABS
ORAL_TABLET | ORAL | Status: AC
  Filled 2018-08-31: qty 2

## 2018-08-31 MED ORDER — CHLORHEXIDINE GLUCONATE CLOTH 2 % EX PADS
6.0000 | MEDICATED_PAD | Freq: Once | CUTANEOUS | Status: AC
  Administered 2018-08-31: 6 via TOPICAL

## 2018-08-31 MED ORDER — ACETAMINOPHEN 500 MG PO TABS
1000.0000 mg | ORAL_TABLET | ORAL | Status: AC
  Administered 2018-08-31: 1000 mg via ORAL

## 2018-08-31 MED ORDER — SUCCINYLCHOLINE CHLORIDE 20 MG/ML IJ SOLN
INTRAMUSCULAR | Status: DC | PRN
  Administered 2018-08-31: 100 mg via INTRAVENOUS

## 2018-08-31 MED ORDER — FENTANYL CITRATE (PF) 250 MCG/5ML IJ SOLN
INTRAMUSCULAR | Status: AC
  Filled 2018-08-31: qty 5

## 2018-08-31 MED ORDER — BUPIVACAINE-EPINEPHRINE (PF) 0.25% -1:200000 IJ SOLN
INTRAMUSCULAR | Status: DC | PRN
  Administered 2018-08-31: 30 mL

## 2018-08-31 MED ORDER — GABAPENTIN 300 MG PO CAPS
300.0000 mg | ORAL_CAPSULE | ORAL | Status: AC
  Administered 2018-08-31: 300 mg via ORAL

## 2018-08-31 MED ORDER — ONDANSETRON HCL 4 MG/2ML IJ SOLN: 4.0000 mg | Freq: Once | INTRAMUSCULAR | Status: DC | PRN

## 2018-08-31 MED ORDER — LIDOCAINE HCL (CARDIAC) PF 100 MG/5ML IV SOSY
PREFILLED_SYRINGE | INTRAVENOUS | Status: DC | PRN
  Administered 2018-08-31: 100 mg via INTRAVENOUS

## 2018-08-31 MED ORDER — MIDAZOLAM HCL 2 MG/2ML IJ SOLN
INTRAMUSCULAR | Status: AC
  Filled 2018-08-31: qty 2

## 2018-08-31 MED ORDER — IBUPROFEN 600 MG PO TABS: 600.0000 mg | ORAL_TABLET | Freq: Three times a day (TID) | ORAL | 0 refills | Status: DC | PRN

## 2018-08-31 MED ORDER — CEFAZOLIN SODIUM-DEXTROSE 2-4 GM/100ML-% IV SOLN
2.0000 g | INTRAVENOUS | Status: AC
  Administered 2018-08-31: 2 g via INTRAVENOUS

## 2018-08-31 MED ORDER — FAMOTIDINE 20 MG PO TABS
20.0000 mg | ORAL_TABLET | Freq: Once | ORAL | Status: AC
  Administered 2018-08-31: 20 mg via ORAL

## 2018-08-31 MED ORDER — CHLORHEXIDINE GLUCONATE CLOTH 2 % EX PADS: 6.0000 | MEDICATED_PAD | Freq: Once | CUTANEOUS | Status: DC

## 2018-08-31 MED ORDER — DEXAMETHASONE SODIUM PHOSPHATE 10 MG/ML IJ SOLN
INTRAMUSCULAR | Status: DC | PRN
  Administered 2018-08-31: 8 mg via INTRAVENOUS

## 2018-08-31 MED ORDER — FENTANYL CITRATE (PF) 100 MCG/2ML IJ SOLN
INTRAMUSCULAR | Status: DC | PRN
  Administered 2018-08-31 (×2): 25 ug via INTRAVENOUS
  Administered 2018-08-31: 100 ug via INTRAVENOUS

## 2018-08-31 MED ORDER — OXYCODONE HCL 5 MG PO TABS
5.0000 mg | ORAL_TABLET | Freq: Once | ORAL | Status: AC
  Administered 2018-08-31: 14:00:00 5 mg via ORAL

## 2018-08-31 MED ORDER — FENTANYL CITRATE (PF) 100 MCG/2ML IJ SOLN
25.0000 ug | INTRAMUSCULAR | Status: DC | PRN
  Administered 2018-08-31 (×4): 25 ug via INTRAVENOUS

## 2018-08-31 MED ORDER — BUPIVACAINE LIPOSOME 1.3 % IJ SUSP: 20.0000 mL | Freq: Once | INTRAMUSCULAR | Status: DC

## 2018-08-31 SURGICAL SUPPLY — 36 items
ADH SKN CLS APL DERMABOND .7 (GAUZE/BANDAGES/DRESSINGS) ×1
APL PRP STRL LF DISP 70% ISPRP (MISCELLANEOUS) ×1
CANISTER SUCT 1200ML W/VALVE (MISCELLANEOUS) ×2 IMPLANT
CHLORAPREP W/TINT 26 (MISCELLANEOUS) ×2 IMPLANT
COVER LIGHT HANDLE STERIS (MISCELLANEOUS) ×1 IMPLANT
COVER WAND RF STERILE (DRAPES) IMPLANT
DERMABOND ADVANCED (GAUZE/BANDAGES/DRESSINGS) ×1
DERMABOND ADVANCED .7 DNX12 (GAUZE/BANDAGES/DRESSINGS) ×1 IMPLANT
DRAPE LAPAROTOMY 100X77 ABD (DRAPES) ×2 IMPLANT
ELECT CAUTERY BLADE 6.4 (BLADE) ×2 IMPLANT
ELECT REM PT RETURN 9FT ADLT (ELECTROSURGICAL) ×2
ELECTRODE REM PT RTRN 9FT ADLT (ELECTROSURGICAL) ×1 IMPLANT
GLOVE PROTEXIS LATEX SZ 7.5 (GLOVE) ×2 IMPLANT
GLOVE SURG LATEX 7.5 PF (GLOVE) ×1 IMPLANT
GLOVE SURG SYN 7.0 (GLOVE) ×6 IMPLANT
GLOVE SURG SYN 7.0 PF PI (GLOVE) ×1 IMPLANT
GLOVE SURG SYN 7.5  E (GLOVE) ×1
GLOVE SURG SYN 7.5 E (GLOVE) ×1 IMPLANT
GLOVE SURG SYN 7.5 PF PI (GLOVE) IMPLANT
GOWN STRL REUS W/ TWL LRG LVL3 (GOWN DISPOSABLE) ×2 IMPLANT
GOWN STRL REUS W/TWL LRG LVL3 (GOWN DISPOSABLE) ×6
NEEDLE HYPO 22GX1.5 SAFETY (NEEDLE) ×2 IMPLANT
NS IRRIG 500ML POUR BTL (IV SOLUTION) ×2 IMPLANT
PACK BASIN MINOR ARMC (MISCELLANEOUS) ×2 IMPLANT
SUT ETHIBOND 0 (SUTURE) ×3 IMPLANT
SUT ETHIBOND 0 MO6 C/R (SUTURE) ×1 IMPLANT
SUT MNCRL AB 4-0 PS2 18 (SUTURE) ×2 IMPLANT
SUT PROLENE 2 0 SH DA (SUTURE) ×2 IMPLANT
SUT SILK 0 (SUTURE)
SUT SILK 0 30XBRD TIE 6 (SUTURE) ×1 IMPLANT
SUT SILK 0 SH 30 (SUTURE) ×1 IMPLANT
SUT VIC AB 0 SH 27 (SUTURE) ×2 IMPLANT
SUT VIC AB 3-0 SH 27 (SUTURE) ×6
SUT VIC AB 3-0 SH 27X BRD (SUTURE) ×3 IMPLANT
SYR 10ML LL (SYRINGE) ×2 IMPLANT
SYR 20CC LL (SYRINGE) ×1 IMPLANT

## 2018-08-31 NOTE — Transfer of Care (Signed)
Immediate Anesthesia Transfer of Care Note  Patient: Jimmy [name]  Procedure(s) Performed: HERNIA REPAIR VENTRAL ADULT (N/A )  Patient Location: PACU  Anesthesia Type:General  Level of Consciousness: awake, alert  and oriented  Airway & Oxygen Therapy: Patient Spontanous Breathing and Patient connected to face mask oxygen  Post-op Assessment: Report given to RN and Post -op Vital signs reviewed and stable  Post vital signs: Reviewed and stable  Last Vitals:  Vitals Value Taken Time  BP 125/72 08/31/18 1313  Temp    Pulse 90 08/31/18 1315  Resp 15 08/31/18 1315  SpO2 98 % 08/31/18 1315  Vitals shown include unvalidated device data.  Last Pain:  Vitals:   08/31/18 0816  TempSrc: Tympanic  PainSc: 0-No pain         Complications: No apparent anesthesia complications

## 2018-08-31 NOTE — Anesthesia Procedure Notes (Signed)
Procedure Name: Intubation Date/Time: 08/31/2018 12:01 PM Performed by: Rona Ravens, CRNA Pre-anesthesia Checklist: Patient identified, Emergency Drugs available, Suction available and Patient being monitored Patient Re-evaluated:Patient Re-evaluated prior to induction Oxygen Delivery Method: Circle system utilized Preoxygenation: Pre-oxygenation with 100% oxygen Induction Type: IV induction and Rapid sequence Laryngoscope Size: Mac and 4 Grade View: Grade I Tube type: Oral Tube size: 7.0 mm Number of attempts: 1 Placement Confirmation: ETT inserted through vocal cords under direct vision,  CO2 detector,  breath sounds checked- equal and bilateral and positive ETCO2 Secured at: 21 cm Tube secured with: Tape

## 2018-08-31 NOTE — Anesthesia Post-op Follow-up Note (Signed)
Anesthesia QCDR form completed.        

## 2018-08-31 NOTE — Discharge Instructions (Signed)

## 2018-08-31 NOTE — Op Note (Signed)
  Procedure Date:  08/31/2018  Pre-operative Diagnosis:  Ventral incisional hernia  Post-operative Diagnosis:  Ventral incisional hernia  Procedure:  Open Incisional Hernia Repair  Surgeon:  Melvyn Neth, MD  Anesthesia:  General endotracheal  Estimated Blood Loss:  5 ml  Specimens:  None  Complications:  None  Indications for Procedure:  This is a 28 y.o. male who presents with an incisional ventral hernia, causing pain.  His surgery had been initially scheduled for March 2020 but he tested positive for cocaine, and subsequent attempt was canceled due to COVID-19 restrictions.  He's now ready for surgery.  His urine drug screen was negative today.  The options of surgery versus observation were reviewed with the patient and/or family. The risks of bleeding, abscess or infection, recurrence of symptoms, injury to surrounding structures, and chronic pain were all discussed with the patient and was willing to proceed.  Description of Procedure: The patient was correctly identified in the preoperative area and brought into the operating room.  The patient was placed supine with VTE prophylaxis in place.  Appropriate time-outs were performed.  Anesthesia was induced and the patient was intubated.  Appropriate antibiotics were infused.  The abdomen was prepped and draped in a sterile fashion. The patient had a prior transverse RUQ incision, and the hernia defect was located at the medial corner, which was at midline.  A 3 cm transverse incision was created over the hernia defect and dissection was carried down the subcutaneous tissue using electrocautery.  The hernia defect was about 5 mm size, and it was extended to 1.5 cm for better evaluation.  A small segment of omentum was protruding and this was reduced without complication.  Fascial edges were cleaned.  The hernia defect was then closed using 0 Ethibond sutures.  50 ml of Exparel solution mixed with 0.5% bupivacaine with epi was  infiltrated on the fascia and subcutaneous tissue.  The wound was then irrigated thoroughly and closed in two layers using 3-0 Vicryl and 4-0 Monocryl.  The incision was cleaned and sealed with DermaBond.  The patient was emerged from anesthesia and extubated and brought to the recovery room for further management.  The patient tolerated the procedure well and all counts were correct at the end of the case.   Melvyn Neth, MD

## 2018-08-31 NOTE — H&P (Signed)
History of Present Illness: Jimmy [name] is a 28 y.o. male presenting for evaluation of a ventral hernia.  Patient reports that he's had the hernia for at least a year, but has become more painful since November/December 2019.  He had an outpatient CT scan done on 02/2018 which showed a small fat containing ventral hernia in the midline above umbilicus.  He was referred for further evaluation.  The patient reports that the site is always tender, particularly if he's straining in any way like coughing or sneezing.  It does interfere with his job because he does heavy lifting at times.  He used to work Architect as well.  Denies any nausea, vomiting, constipation or diarrhea, but reports that sometimes after he eats he feels that it's tight in the upper abdomen towards the hernia site.  He has lost weight recently because of the pain he has.  Of note, he reports he had a surgery for his esophagus as an infant.  He was initially scheduled for surgery on 3/11 but it was canceled because he had a positive urine tox screen for cocaine.  Afterwards, his surgery was postponed due to COVID-19 restrictions.  He now presents for surgery for incisional hernia repair.  Past Medical History:     Past Medical History:  Diagnosis Date  . ADHD (attention deficit hyperactivity disorder)   . Arthritis   . Migraine   . Scoliosis      Past Surgical History:      Past Surgical History:  Procedure Laterality Date  . ESOPHAGUS SURGERY      Home Medications:        Prior to Admission medications   Medication Sig Start Date End Date Taking? Authorizing Provider  docusate sodium (COLACE) 50 MG capsule Take 1 capsule (50 mg total) by mouth 2 (two) times daily. 03/17/18  Yes Jimmy Jun, FNP  omeprazole (PRILOSEC) 20 MG capsule TAKE 1 CAPSULE BY MOUTH TWICE DAILY AS NEEDED 03/03/18  Yes [provider]  QUEtiapine (SEROQUEL) 100 MG tablet Take 1 tablet (100 mg total) by mouth at  bedtime. 04/01/18  Yes Jimmy Jun, FNP    Allergies:      Allergies  Allergen Reactions  . Cyclobenzaprine Nausea Only and Hives  . Depakote [Divalproex Sodium] Other (See Comments)    "made him crazy"  . Naproxen Nausea Only    Acid reflux  . Ritalin [Methylphenidate Hcl] Other (See Comments)    Increased hyper activeness   . Tramadol Other (See Comments)    "acid"    Social History:  reports that he has been smoking. He has been smoking about 1.00 pack per day. He uses smokeless tobacco. He reports previous drug use. He reports that he does not drink alcohol.   Family History:      Family History  Problem Relation Age of Onset  . Diabetes Mother   . Diabetes Maternal Grandmother   . Cancer Maternal Grandmother        unknown type. doing chemo  . Cancer Maternal Grandfather   . Diabetes Maternal Aunt     Review of Systems: Review of Systems  Constitutional: Positive for weight loss. Negative for chills and fever.  HENT: Negative for hearing loss.   Respiratory: Negative for cough.   Cardiovascular: Negative for chest pain.  Gastrointestinal: Positive for abdominal pain. Negative for constipation, diarrhea, nausea and vomiting.  Genitourinary: Negative for dysuria.  Musculoskeletal: Negative for myalgias.  Neurological: Negative for dizziness.  Psychiatric/Behavioral:  Negative for depression.    Physical Exam BP 129/72   Pulse 86   Temp 97.7 F (36.5 C) (Temporal)   Resp 16   Ht 5\' 4"  (1.626 m)   Wt 162 lb 12.8 oz (73.8 kg)   SpO2 98%   BMI 27.94 kg/m  CONSTITUTIONAL: No acute distress HEENT:  Normocephalic, atraumatic, extraocular motion intact. NECK: Trachea is midline, and there is no jugular venous distension.  RESPIRATORY:  Lungs are clear, and breath sounds are equal bilaterally. Normal respiratory effort without pathologic use of accessory muscles. CARDIOVASCULAR: Heart is regular without murmurs, gallops, or rubs. GI:  The abdomen is soft, non-distended, with tenderness about 6 cm superior to the umbilicus. This is at the medial corner of his prior incision, and there appears to be a 1 cm defect, though there is not much bulging at the site.  Unclear if he had esophageal surgery, though the incision is transverse in the right upper quadrant, ending at the midline.  Could possibly be pyloric stenosis incision.  There were no palpable masses.  MUSCULOSKELETAL:  Normal muscle strength and tone in all four extremities.  No peripheral edema or cyanosis. SKIN: Skin turgor is normal. There are no pathologic skin lesions.  NEUROLOGIC:  Motor and sensation is grossly normal.  Cranial nerves are grossly intact. PSYCH:  Alert and oriented to person, place and time. Affect is normal.  Laboratory Analysis: Lab Results Last 24 Hours  No results found for this or any previous visit (from the past 24 hour(s)).    Imaging: CT scan 03/02/18: Impression: 1.  No evidence of acute abnormalities within the abdomen or pelvis. 2.  Midline ventral upper abdominal hernia containing fat measuring up to 14 mm.  Assessment and Plan: This is a 28 y.o. male with likely an incisional ventral hernia.  Discussed with the patient that I believe his hernia is likely incisional in nature given the proximity of it to the medial corner of his transverse scar.  Discussed the option of surgical repair and that we would plan on an open approach, possibly with mesh if the defect is big enough, otherwise primary repair with sutures.  Discussed with him the risks of bleeding, infection, and injury to surrounding structures and he's willing to proceed.  Discussed that he would have a no heavy lifting or pushing restriction of no more than 10-15 lbs for a period of 4 weeks, but that he would work with reduced duties until then.  He is in agreement with this plan and all of his questions have been answered.  His urine drug screen today is  negative.   Melvyn Neth, [facility]

## 2018-08-31 NOTE — Anesthesia Preprocedure Evaluation (Signed)
Anesthesia Evaluation  Patient identified by MRN, date of birth, ID band Patient awake    Reviewed: Allergy & Precautions, NPO status , Patient's Chart, lab work & pertinent test results, reviewed documented beta blocker date and time   Airway Mallampati: II  TM Distance: >3 FB     Dental  (+) Chipped   Pulmonary Current Smoker,           Cardiovascular      Neuro/Psych  Headaches, PSYCHIATRIC DISORDERS Anxiety  Neuromuscular disease    GI/Hepatic GERD  Controlled,  Endo/Other    Renal/GU      Musculoskeletal  (+) Arthritis ,   Abdominal   Peds  Hematology   Anesthesia Other Findings ADHD. Hx of opiates and cocaine.  Reproductive/Obstetrics                             Anesthesia Physical Anesthesia Plan  ASA: III  Anesthesia Plan: General   Post-op Pain Management:    Induction: Intravenous  PONV Risk Score and Plan:   Airway Management Planned: Oral ETT  Additional Equipment:   Intra-op Plan:   Post-operative Plan:   Informed Consent: I have reviewed the patients History and Physical, chart, labs and discussed the procedure including the risks, benefits and alternatives for the proposed anesthesia with the patient or authorized representative who has indicated his/her understanding and acceptance.       Plan Discussed with: CRNA  Anesthesia Plan Comments:         Anesthesia Quick Evaluation

## 2018-09-01 ENCOUNTER — Encounter: Payer: Self-pay | Admitting: Surgery

## 2018-09-01 ENCOUNTER — Telehealth: Payer: Self-pay | Admitting: Surgery

## 2018-09-01 NOTE — Anesthesia Postprocedure Evaluation (Signed)
Anesthesia Post Note  Patient: Jimmy [name]  Procedure(s) Performed: HERNIA REPAIR VENTRAL ADULT (N/A )  Patient location during evaluation: PACU Anesthesia Type: General Level of consciousness: awake and alert Pain management: pain level controlled Vital Signs Assessment: post-procedure vital signs reviewed and stable Respiratory status: spontaneous breathing, nonlabored ventilation, respiratory function stable and patient connected to nasal cannula oxygen Cardiovascular status: blood pressure returned to baseline and stable Postop Assessment: no apparent nausea or vomiting Anesthetic complications: no     Last Vitals:  Vitals:   08/31/18 1413 08/31/18 1425  BP: 132/72 (!) 141/79  Pulse: 80 75  Resp: 19 14  Temp: 36.6 C 36.8 C  SpO2: 97% 98%    Last Pain:  Vitals:   09/01/18 0824  TempSrc:   PainSc: [place]

## 2018-09-01 NOTE — Telephone Encounter (Signed)
Patient is calling and is complaining of being in some pain on the left side. And also having pain when he's breathing. Please call patient and advise.

## 2018-09-01 NOTE — Telephone Encounter (Signed)
Called patient and he states he is not having any problem this morning with shortness of breath. Advised if so to call the office or go to ER. He states the pain is in his left side near incision site. He is taking his pain meds. Advised him to use ice off and on. He will call back with any questions.

## 2018-09-02 ENCOUNTER — Encounter: Payer: Self-pay | Admitting: Surgery

## 2018-09-03 ENCOUNTER — Telehealth: Payer: Self-pay

## 2018-09-03 NOTE — Telephone Encounter (Signed)
Patient had ventral hernia repair 08/31/2018. He states he is taking his pain medication and it is not helping. We discussed taking ibupropen along with oxycodine and he states this isnt helping.   Denies redness around incision, no fever, eating and drinking ok and he is having bowel movements.  Patient added to scheduled 09/04/2018 Dr.Piscoya.

## 2018-09-04 ENCOUNTER — Encounter: Payer: Self-pay | Admitting: Surgery

## 2018-09-04 ENCOUNTER — Other Ambulatory Visit: Payer: Self-pay

## 2018-09-04 ENCOUNTER — Ambulatory Visit (INDEPENDENT_AMBULATORY_CARE_PROVIDER_SITE_OTHER): Payer: Self-pay | Admitting: Surgery

## 2018-09-04 VITALS — BP 135/81 | HR 85 | Temp 98.6°F | Resp 16 | Ht 64.0 in | Wt 170.0 lb

## 2018-09-04 DIAGNOSIS — R1084 Generalized abdominal pain: Secondary | ICD-10-CM

## 2018-09-04 DIAGNOSIS — K432 Incisional hernia without obstruction or gangrene: Secondary | ICD-10-CM

## 2018-09-04 DIAGNOSIS — Z09 Encounter for follow-up examination after completed treatment for conditions other than malignant neoplasm: Secondary | ICD-10-CM

## 2018-09-04 NOTE — Progress Notes (Signed)
09/04/2018  HPI: Jimmy [name] is a 28 y.o. male s/p incisional hernia repair on 6/15.  He called the office because he's still having pain at the incision and over the left side of his abdomen.  Preop, he reported that he had left side pain constant and particularly after eating.  He had appointments with Dr. Vicente Males with GI but had no-showed multiple times and was dismissed from clinic.  Denies any nausea, vomiting, opening or breakdown of the incision, drainage, redness.  Vital signs: BP 135/81   Pulse 85   Temp 98.6 F (37 C) (Temporal)   Resp 16   Ht 5\' 4"  (1.626 m)   Wt 170 lb (77.1 kg)   BMI 29.18 kg/m    Physical Exam: Constitutional: No acute distress Abdomen:  Soft, non-distended, with appropriate tenderness to palpation at the incision.  The incision (transverse, epigastric, midline) is clean, dry, intact, without any cellulitis or drainage or any evidence of infection.  There is no ecchymosis surrounding, but some swelling.  Assessment/Plan: This is a 28 y.o. male s/p incisional hernia repair.  --Discussed with the patient that his symptoms are typical for post-op.  The swelling and pain at the incision are normal and currently there is no evidence of any post-op complication.  Reassured the patient that this will continue to improve.  He can take Ibuprofen 800 mg q8h prn pain. --Discussed with patient as well that the left sided abdominal pain should be evaluated by GI.  We will send a new referral to [place].   --Patient has follow up with me in two weeks.   Melvyn Neth, [facility]

## 2018-09-04 NOTE — Patient Instructions (Addendum)
Referral to [place] sent. Someone from their office will contact you to schedule an appointment within 5-7 days.   You may apply ice pack to the area to help with the discomfort.    GENERAL POST-OPERATIVE PATIENT INSTRUCTIONS   WOUND CARE INSTRUCTIONS:  Keep a dry clean dressing on the wound if there is drainage. The initial bandage may be removed after 24 hours.  Once the wound has quit draining you may leave it open to air.  If clothing rubs against the wound or causes irritation and the wound is not draining you may cover it with a dry dressing during the daytime.  Try to keep the wound dry and avoid ointments on the wound unless directed to do so.  If the wound becomes bright red and painful or starts to drain infected material that is not clear, please contact your physician immediately.  If the wound is mildly pink and has a thick firm ridge underneath it, this is normal, and is referred to as a healing ridge.  This will resolve over the next 4-6 weeks.  BATHING: You may shower if you have been informed of this by your surgeon. However, Please do not submerge in a tub, hot tub, or pool until incisions are completely sealed or have been told by your surgeon that you may do so.  DIET:  You may eat any foods that you can tolerate.  It is a good idea to eat a high fiber diet and take in plenty of fluids to prevent constipation.  If you do become constipated you may want to take a mild laxative or take ducolax tablets on a daily basis until your bowel habits are regular.  Constipation can be very uncomfortable, along with straining, after recent surgery.  ACTIVITY:  You are encouraged to cough and deep breath or use your incentive spirometer if you were given one, every 15-30 minutes when awake.  This will help prevent respiratory complications and low grade fevers post-operatively if you had a general anesthetic.  You may want to hug a pillow when coughing and sneezing to add  additional support to the surgical area, if you had abdominal or chest surgery, which will decrease pain during these times.  You are encouraged to walk and engage in light activity for the next two weeks.  You should not lift more than 20 pounds, until 10/12/2018 as it could put you at increased risk for complications.  Twenty pounds is roughly equivalent to a plastic bag of groceries. At that time- Listen to your body when lifting, if you have pain when lifting, stop and then try again in a few days. Soreness after doing exercises or activities of daily living is normal as you get back in to your normal routine.  MEDICATIONS:  Try to take narcotic medications and anti-inflammatory medications, such as tylenol, ibuprofen, naprosyn, etc., with food.  This will minimize stomach upset from the medication.  Should you develop nausea and vomiting from the pain medication, or develop a rash, please discontinue the medication and contact your physician.  You should not drive, make important decisions, or operate machinery when taking narcotic pain medication.  SUNBLOCK Use sun block to incision area over the next year if this area will be exposed to sun. This helps decrease scarring and will allow you avoid a permanent darkened area over your incision.  QUESTIONS:  Please feel free to call our office if you have any questions, and we will be glad to  assist you. [phone]

## 2018-09-16 ENCOUNTER — Other Ambulatory Visit: Payer: Self-pay

## 2018-09-16 ENCOUNTER — Ambulatory Visit (INDEPENDENT_AMBULATORY_CARE_PROVIDER_SITE_OTHER): Payer: Self-pay | Admitting: Surgery

## 2018-09-16 ENCOUNTER — Encounter: Payer: Self-pay | Admitting: Surgery

## 2018-09-16 VITALS — BP 135/80 | HR 84 | Temp 97.9°F | Ht 64.0 in | Wt 168.0 lb

## 2018-09-16 DIAGNOSIS — K432 Incisional hernia without obstruction or gangrene: Secondary | ICD-10-CM

## 2018-09-16 DIAGNOSIS — Z09 Encounter for follow-up examination after completed treatment for conditions other than malignant neoplasm: Secondary | ICD-10-CM

## 2018-09-16 NOTE — Patient Instructions (Signed)
May use ice/cold to area and continue to use Ibuprofen.  Follow-up with our office as needed.  Please call and ask to speak with a nurse if you develop questions or concerns.

## 2018-09-16 NOTE — Progress Notes (Signed)
09/16/2018  HPI: BOYCE KELTNER is a 28 y.o. male s/p incisional hernia repair on 08/31/2018.  He presents today for follow-up.  He reports that he still having some soreness right above and below incision in the epigastric area but this has been improving.  Denies any worsening pain, nausea, vomiting.  He denies having any further left sided abdominal pain  Vital signs: BP 135/80   Pulse 84   Temp 97.9 F (36.6 C)   Ht 5\' 4"  (1.626 m)   Wt 168 lb (76.2 kg)   SpO2 97%   BMI 28.84 kg/m    Physical Exam: Constitutional: No acute distress Abdomen: Soft, nondistended, with mild soreness to palpation around the incision in the epigastric area.  Incision itself is clean dry and intact with no evidence of infection or breakdown.  There is no evidence of recurrence.  Assessment/Plan: This is a 28 y.o. male s/p incisional hernia repair.  -Discussed with the patient that the wound appears to be healing very well without any evidence of recurrence of his hernia.  Discussed with him that the soreness that he is feeling could be related to the sutures that are pulling on the tissue to close the defect.  This should continue to improve on its own with time. -Patient may use ibuprofen high-dose for pain control as this will help with inflammation and swelling. -Patient may follow-up with [place] on an as-needed basis.   Melvyn Neth, [facility]

## 2018-10-01 ENCOUNTER — Ambulatory Visit
Admission: EM | Admit: 2018-10-01 | Discharge: 2018-10-01 | Disposition: A | Discharge status: Home / Self Care | Payer: Self-pay | Attending: Emergency Medicine | Admitting: Emergency Medicine

## 2018-10-01 ENCOUNTER — Other Ambulatory Visit: Payer: Self-pay

## 2018-10-01 DIAGNOSIS — R0789 Other chest pain: Secondary | ICD-10-CM

## 2018-10-01 DIAGNOSIS — M25511 Pain in right shoulder: Secondary | ICD-10-CM

## 2018-10-01 DIAGNOSIS — S46811A Strain of other muscles, fascia and tendons at shoulder and upper arm level, right arm, initial encounter: Secondary | ICD-10-CM

## 2018-10-01 DIAGNOSIS — M62838 Other muscle spasm: Secondary | ICD-10-CM

## 2018-10-01 MED ORDER — METHOCARBAMOL 500 MG PO TABS: 500.0000 mg | ORAL_TABLET | Freq: Every day | ORAL | 0 refills | Status: DC

## 2018-10-01 MED ORDER — PREDNISONE 20 MG PO TABS: 20.0000 mg | ORAL_TABLET | Freq: Two times a day (BID) | ORAL | 0 refills | Status: AC

## 2018-10-01 NOTE — ED Triage Notes (Signed)
Pt states that he has right sided chest pain,under axillary ara and radiates to right back

## 2018-10-01 NOTE — Discharge Instructions (Addendum)
Symptoms most likely musculoskeletal in nature Continue conservative management of rest, ice, heat, and gentle stretches Take prednisone as prescribed and to completion Take cyclobenzaprine at nighttime for symptomatic relief. Avoid driving or operating heavy machinery while using medication. Follow up with orthopedist for further evaluation and management Present to ER if worsening or new symptoms (fever, chills, chest pain, shortness of breath, lightheadedness, dizziness, nausea, vomiting, abdominal pain, changes in bowel or bladder habits, etc...)

## 2018-10-01 NOTE — ED Provider Notes (Signed)
[place]   [id number] 10/01/18 Arrival Time: 3903   CC: CHEST wall pain  SUBJECTIVE:  HONEST VANLEER is a 28 y.o. male who presents with complaint of RT sided chest wall, back and shoulder pain that began 5 days ago.  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  Localizes pain to RT side of chest wall down toward right low back and RT shoulder.  Describes as improving, that is intermittent (with episodes lasting 30 minutes) and sharp in character.  Rates pain as 8/10.   Has tried ibuprofen without relief.  Symptoms made worse with reaching overhead and stretching towards his left side.  Reports similar symptoms last month and diagnosed with GERD and treated with pepcid.  Was unable to pick up prescription.  Denies fever, chills, lightheadedness, dizziness, palpitations, tachycardia, SOB, nausea, vomiting, abdominal pain, diaphoresis, numbness/tingling in extremities, peripheral edema, or anxiety.  Last BM and urinated earlier today  Denies SOB, calf pain or swelling, recent long travel, recent surgery, malignancy, hormone use, or previous blood clot. Hernia repair last month.  Smokes 1/2 -1 PPD/ 17 years.  Denies recreational drug use, cocaine use.    maternal grandmother with CHF  Reports having EKG in the past  ROS: As per HPI. Past Medical History:  Diagnosis Date  . ADHD (attention deficit hyperactivity disorder)   . Anxiety   . Arthritis   . Cancer ([place])    skin cancer  . Chest pain 08/2018  . GERD (gastroesophageal reflux disease)    no meds  . Migraine    migraines due to pinched nerve  . Pinched nerve in neck   . Pinched nerve in neck   . Scoliosis    Past Surgical History:  Procedure Laterality Date  . ESOPHAGUS SURGERY     as a baby  . VENTRAL HERNIA REPAIR N/A 08/31/2018   Procedure: HERNIA REPAIR VENTRAL ADULT;  Surgeon: Olean Ree, MD;  Location: [facility] ORS;  Service: General;  Laterality: N/A;   Allergies  Allergen Reactions  .  Cyclobenzaprine Nausea Only and Hives  . Depakote [Divalproex Sodium] Other (See Comments)    "made him crazy"  . Naproxen Nausea Only    Acid reflux  . Other     Staples-pt states skin became reddened and he tasted metal (08-2018)  . Ritalin [Methylphenidate Hcl] Other (See Comments)    Increased hyper activeness   . Tramadol Other (See Comments)    Acid reflux   No current facility-administered medications on file prior to encounter.    Current Outpatient Medications on File Prior to Encounter  Medication Sig Dispense Refill  . QUEtiapine (SEROQUEL) 100 MG tablet Take 1 tablet (100 mg total) by mouth at bedtime. 30 tablet 1  . [DISCONTINUED] famotidine (PEPCID) 40 MG tablet Take 1 tablet (40 mg total) by mouth daily. 90 tablet 1   Social History   Socioeconomic History  . Marital status: Married    Spouse name: Not on file  . Number of children: Not on file  . Years of education: Not on file  . Highest education level: Not on file  Occupational History  . Not on file  Social Needs  . Financial resource strain: Not on file  . Food insecurity    Worry: Not on file    Inability: Not on file  . Transportation needs    Medical: Not on file    Non-medical: Not on file  Tobacco Use  . Smoking status: Current  Every Day Smoker    Packs/day: 1.00    Years: 15.00    Pack years: 15.00    Types: Cigarettes  . Smokeless tobacco: Current User  Substance and Sexual Activity  . Alcohol use: Yes    Comment: occ  . Drug use: Not Currently    Types: Cocaine, Marijuana  . Sexual activity: Yes  Lifestyle  . Physical activity    Days per week: Not on file    Minutes per session: Not on file  . Stress: Not on file  Relationships  . Social Herbalist on phone: Not on file    Gets together: Not on file    Attends religious service: Not on file    Active member of club or organization: Not on file    Attends meetings of clubs or organizations: Not on file    Relationship  status: Not on file  . Intimate partner violence    Fear of current or ex partner: Not on file    Emotionally abused: Not on file    Physically abused: Not on file    Forced sexual activity: Not on file  Other Topics Concern  . Not on file  Social History Narrative  . Not on file   Family History  Problem Relation Age of Onset  . Diabetes Mother   . Diabetes Maternal Grandmother   . Cancer Maternal Grandmother        unknown type. doing chemo  . Cancer Maternal Grandfather   . Diabetes Maternal Aunt      OBJECTIVE:  Vitals:   10/01/18 1332  BP: 130/82  Pulse: 95  Resp: 18  Temp: 98.9 F (37.2 C)  SpO2: 98%    General appearance: alert; no distress Eyes: PERRLA; EOMI; conjunctiva normal HENT: normocephalic; atraumatic; oropharynx clear Neck: supple; no carotid bruits Lungs: clear to auscultation bilaterally without adventitious breath sounds Heart: regular rate and rhythm.  Radial and posterior tibialis pulses 2+ and equal bilaterally Chest Wall: TTP over lateral RT chest, NTTP with AP or lateral compression Abdomen: soft, non-tender; bowel sounds normal; no guarding Extremities: diffusely TTP over RT anterior shoulder, and superior aspect of the trapezius; strength and sensation intact about the UEs; no calf tenderness, swelling or erythema Skin: warm and dry  Psychological: alert and cooperative; anxious mood and affect   ASSESSMENT & PLAN:  1. Acute pain of right shoulder   2. Strain of right trapezius muscle, initial encounter   3. Trapezius muscle spasm   4. Chest wall pain     Meds ordered this encounter  Medications  . methocarbamol (ROBAXIN) 500 MG tablet    Sig: Take 1 tablet (500 mg total) by mouth at bedtime.    Dispense:  12 tablet    Refill:  0    Order Specific Question:   Supervising Provider    Answer:   Raylene Everts [[id number]]  . predniSONE (DELTASONE) 20 MG tablet    Sig: Take 1 tablet (20 mg total) by mouth 2 (two) times daily with  a meal for 5 days.    Dispense:  10 tablet    Refill:  0    Order Specific Question:   Supervising Provider    Answer:   Raylene Everts [[id number]]   Symptoms most likely musculoskeletal in nature Continue conservative management of rest, ice, heat, and gentle stretches Take prednisone as prescribed and to completion Take cyclobenzaprine at nighttime for symptomatic relief. Avoid driving or operating heavy  machinery while using medication. Follow up with orthopedist for further evaluation and management Present to ER if worsening or new symptoms (fever, chills, chest pain, shortness of breath, lightheadedness, dizziness, nausea, vomiting, abdominal pain, changes in bowel or bladder habits, etc...)  Chest pain precautions given. Reviewed expectations re: course of current medical issues. Questions answered. Outlined signs and symptoms indicating need for more acute intervention. Patient verbalized understanding. After Visit Summary given.   Lestine Box, PA-C 10/01/18 1535

## 2018-11-06 ENCOUNTER — Other Ambulatory Visit: Payer: Self-pay | Admitting: Family Medicine

## 2018-11-06 NOTE — Telephone Encounter (Signed)
Forwarding medication refills request to PCP for review.

## 2018-11-13 ENCOUNTER — Encounter: Payer: Self-pay | Admitting: Family Medicine

## 2018-11-16 ENCOUNTER — Ambulatory Visit
Admission: EM | Admit: 2018-11-16 | Discharge: 2018-11-16 | Disposition: A | Discharge status: Home / Self Care | Payer: Self-pay

## 2018-11-16 ENCOUNTER — Other Ambulatory Visit: Payer: Self-pay

## 2018-11-16 DIAGNOSIS — S0090XA Unspecified superficial injury of unspecified part of head, initial encounter: Secondary | ICD-10-CM

## 2018-11-16 DIAGNOSIS — W208XXA Other cause of strike by thrown, projected or falling object, initial encounter: Secondary | ICD-10-CM

## 2018-11-16 DIAGNOSIS — S0990XA Unspecified injury of head, initial encounter: Secondary | ICD-10-CM

## 2018-11-16 DIAGNOSIS — G44319 Acute post-traumatic headache, not intractable: Secondary | ICD-10-CM

## 2018-11-16 MED ORDER — QUETIAPINE FUMARATE 100 MG PO TABS: 100.0000 mg | ORAL_TABLET | Freq: Every day | ORAL | 0 refills | Status: DC

## 2018-11-16 MED ORDER — KETOROLAC TROMETHAMINE 60 MG/2ML IM SOLN
60.0000 mg | Freq: Once | INTRAMUSCULAR | Status: AC
  Administered 2018-11-16: 60 mg via INTRAMUSCULAR
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
## 2018-11-16 NOTE — Discharge Instructions (Signed)
Toradol shot given in office Apply ice as needed Rest and drink plenty of fluids Use OTC tylenol as needed Follow up with PCP if symptoms persists Return or go to the ER if you have any new or worsening symptoms such as fever, chills, nausea, vomiting, chest pain, shortness of breath, cough, vision changes, worsening headache despite treatment, slurred speech, facial asymmetry, weakness in arms or legs, etc..Marland Kitchen
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
## 2018-12-17 ENCOUNTER — Encounter: Payer: Self-pay | Admitting: Family Medicine

## 2019-01-15 ENCOUNTER — Other Ambulatory Visit: Payer: Self-pay | Admitting: Family Medicine

## 2019-01-21 ENCOUNTER — Ambulatory Visit: Payer: Self-pay

## 2019-01-23 ENCOUNTER — Other Ambulatory Visit: Payer: Self-pay | Admitting: Family Medicine

## 2019-01-25 ENCOUNTER — Telehealth: Payer: Self-pay | Admitting: General Practice

## 2019-01-25 NOTE — Telephone Encounter (Signed)
1) Medication(s) Requested (by name): -QUEtiapine (SEROQUEL) 100 MG tablet   2) Pharmacy of Choice: -[place], [place]

## 2019-01-25 NOTE — Telephone Encounter (Signed)
Please notify patient that he needs to contact his psychiatrist regarding his need for Seroquel refill

## 2019-01-26 NOTE — Telephone Encounter (Signed)
Patient & girlfriend notified. States that he doesn't currently have psychiatrist.

## 2019-01-28 ENCOUNTER — Telehealth (INDEPENDENT_AMBULATORY_CARE_PROVIDER_SITE_OTHER): Payer: Self-pay | Admitting: Family Medicine

## 2019-01-28 DIAGNOSIS — G542 Cervical root disorders, not elsewhere classified: Secondary | ICD-10-CM

## 2019-01-28 DIAGNOSIS — Z13228 Encounter for screening for other metabolic disorders: Secondary | ICD-10-CM

## 2019-01-28 DIAGNOSIS — F3161 Bipolar disorder, current episode mixed, mild: Secondary | ICD-10-CM

## 2019-01-28 DIAGNOSIS — G43709 Chronic migraine without aura, not intractable, without status migrainosus: Secondary | ICD-10-CM

## 2019-01-28 MED ORDER — METHOCARBAMOL 750 MG PO TABS: 750.0000 mg | ORAL_TABLET | Freq: Three times a day (TID) | ORAL | 1 refills | Status: DC | PRN

## 2019-01-28 MED ORDER — TOPIRAMATE 50 MG PO TABS: 50.0000 mg | ORAL_TABLET | Freq: Two times a day (BID) | ORAL | 1 refills | Status: DC

## 2019-01-28 MED ORDER — QUETIAPINE FUMARATE 100 MG PO TABS: 100.0000 mg | ORAL_TABLET | Freq: Every day | ORAL | 1 refills | Status: DC

## 2019-01-28 NOTE — Progress Notes (Signed)
Having c/o of migraines due to a pinched nerve in the back of his neck. Was seeing a doctor with [facility]. Having sensitivity to lights/sounds/smells.  Requesting refills on the Seroquel. States that he doesn't currently have a psychiatrist to prescribe this Rx.

## 2019-01-28 NOTE — Progress Notes (Signed)
Virtual Visit via Video Note  I connected with Jimmy [name], on 01/28/2019 at 3:23 PM by video enabled telemedicine device due to the COVID-19 pandemic and verified that I am speaking with the correct person using two identifiers.   Consent: I discussed the limitations, risks, security and privacy concerns of performing an evaluation and management service by telemedicine and the availability of in person appointments. I also discussed with the patient that there may be a patient responsible charge related to this service. The patient expressed understanding and agreed to proceed.   Location of Patient: Home  Location of Provider: Clinic   Persons participating in Telemedicine visit: Klinton Aldridge Farrington-CMA Dr. Margarita Rana     History of Present Illness: 28 year old male with history of bipolar disorder, anxiety and depression who presents today for a follow-up visit. He complains of migraines which occur daily and occur on the left side of his head rated as a 7-8/10 and could last up to 2 weeks in a month.  He is unable to take NSAIDs or Tylenol as per his GI he says.  Currently using Goody powder.  This is associated with photophobia, phonophobia but no nausea or vomiting. He complains of presence of " a pinched nerve in his neck" with associated intermittent nerve pain and numbness in his hands but denies loss of strength in his hands.  Previously followed by [facility] for this. He is requesting a prescription of Seroquel which he states he takes for sleep and for stress and has been on this for several years.  Currently does not see psychiatry but received this from his previous PCP.   Past Medical History:  Diagnosis Date  . ADHD (attention deficit hyperactivity disorder)   . Anxiety   . Arthritis   . Cancer ([place])    skin cancer  . Chest pain 08/2018  . GERD (gastroesophageal reflux disease)    no meds  . Migraine    migraines due to  pinched nerve  . Pinched nerve in neck   . Pinched nerve in neck   . Scoliosis    Allergies  Allergen Reactions  . Cyclobenzaprine Nausea Only and Hives  . Depakote [Divalproex Sodium] Other (See Comments)    "made him crazy"  . Naproxen Nausea Only    Acid reflux  . Other     Staples-pt states skin became reddened and he tasted metal (08-2018)  . Ritalin [Methylphenidate Hcl] Other (See Comments)    Increased hyper activeness   . Tramadol Other (See Comments)    Acid reflux    Current Outpatient Medications on File Prior to Visit  Medication Sig Dispense Refill  . omeprazole (PRILOSEC) 40 MG capsule Take 40 mg by mouth 2 (two) times daily.    . [DISCONTINUED] famotidine (PEPCID) 40 MG tablet Take 1 tablet (40 mg total) by mouth daily. 90 tablet 1   No current facility-administered medications on file prior to visit.     Observations/Objective: Awake, alert, oriented x3 Not in acute distress  Assessment and Plan: 1. Chronic migraine without aura without status migrainosus, not intractable [facility] Topamax - topiramate (TOPAMAX) 50 MG tablet; Take 1 tablet (50 mg total) by mouth 2 (two) times daily.  Dispense: 60 tablet; Refill: 1  2. Screening for metabolic disorder - Hemoglobin A1c; Future - Complete Metabolic Panel with GFR; Future - Lipid panel; Future  3. Cervical nerve root impingement PT may be beneficial down the road - methocarbamol (ROBAXIN-750) 750 MG  tablet; Take 1 tablet (750 mg total) by mouth every 8 (eight) hours as needed for muscle spasms.  Dispense: 90 tablet; Refill: 1  4. Bipolar disorder, current episode mixed, mild ([place]) He states Seroquel is for anxiety and sleep however his chart reveals an underlying history of bipolar disorder - QUEtiapine (SEROQUEL) 100 MG tablet; Take 1 tablet (100 mg total) by mouth at bedtime.  Dispense: 30 tablet; Refill: 1   Follow Up Instructions: Return in about 3 months (around 04/30/2019) for medical  conditions.    I discussed the assessment and treatment plan with the patient. The patient was provided an opportunity to ask questions and all were answered. The patient agreed with the plan and demonstrated an understanding of the instructions.   The patient was advised to call back or seek an in-person evaluation if the symptoms worsen or if the condition fails to improve as anticipated.     I provided 18 minutes total of Telehealth time during this encounter including median intraservice time, reviewing previous notes, labs, imaging, medications and explaining diagnosis and management.     Charlott Rakes, MD, FAAFP. [facility] and [facility] [place], [place]   01/28/2019, 3:23 PM

## 2019-01-29 ENCOUNTER — Encounter: Payer: Self-pay | Admitting: Family Medicine

## 2019-02-04 ENCOUNTER — Other Ambulatory Visit: Payer: Self-pay

## 2019-02-04 ENCOUNTER — Encounter: Payer: Self-pay | Admitting: General Practice

## 2019-02-04 DIAGNOSIS — Z13228 Encounter for screening for other metabolic disorders: Secondary | ICD-10-CM

## 2019-02-04 NOTE — Progress Notes (Signed)
Here for fasting labs.

## 2019-02-05 LAB — LIPID PANEL
Chol/HDL Ratio: 4.3 ratio (ref 0.0–5.0)
Cholesterol, Total: 194 mg/dL (ref 100–199)
HDL: 45 mg/dL (ref >39)
LDL Chol Calc (NIH): 131 mg/dL — ABNORMAL HIGH (ref 0–99)
Triglycerides: 99 mg/dL (ref 0–149)
VLDL Cholesterol Cal: 18 mg/dL (ref 5–40)

## 2019-02-05 LAB — CMP14+EGFR
ALT: 46 IU/L — ABNORMAL HIGH (ref 0–44)
AST: 22 IU/L (ref 0–40)
Albumin/Globulin Ratio: 2 (ref 1.2–2.2)
Albumin: 4.7 g/dL (ref 4.1–5.2)
Alkaline Phosphatase: 97 IU/L (ref 39–117)
BUN/Creatinine Ratio: 13 (ref 9–20)
BUN: 12 mg/dL (ref 6–20)
Bilirubin Total: 0.4 mg/dL (ref 0.0–1.2)
CO2: 20 mmol/L (ref 20–29)
Calcium: 9.7 mg/dL (ref 8.7–10.2)
Chloride: 106 mmol/L (ref 96–106)
Creatinine, Ser: 0.91 mg/dL (ref 0.76–1.27)
GFR calc Af Amer: 131 mL/min/{1.73_m2} (ref >59)
GFR calc non Af Amer: 113 mL/min/{1.73_m2} (ref >59)
Globulin, Total: 2.4 g/dL (ref 1.5–4.5)
Glucose: 88 mg/dL (ref 65–99)
Potassium: 4.3 mmol/L (ref 3.5–5.2)
Sodium: 140 mmol/L (ref 134–144)
Total Protein: 7.1 g/dL (ref 6.0–8.5)

## 2019-02-05 LAB — HEMOGLOBIN A1C
Est. average glucose Bld gHb Est-mCnc: 105 mg/dL
Hgb A1c MFr Bld: 5.3 % (ref 4.8–5.6)

## 2019-02-18 ENCOUNTER — Telehealth: Payer: Self-pay | Admitting: General Practice

## 2019-02-18 MED ORDER — AMITRIPTYLINE HCL 10 MG PO TABS: 10.0000 mg | ORAL_TABLET | Freq: Every day | ORAL | 1 refills | Status: DC

## 2019-02-18 NOTE — Telephone Encounter (Signed)
Jimmy [name] called in regards to patients medication -topiramate (TOPAMAX) 50 MG tablet States it gives him a metallic taste in his mouth. Please advise

## 2019-02-18 NOTE — Telephone Encounter (Signed)
Please advise 

## 2019-02-18 NOTE — Telephone Encounter (Signed)
Advised to discontinue Topamax.  I have sent amitriptyline to his pharmacy for his migraines.  This can cause dry mouth as a side effect so he will need to stay hydrated.

## 2019-02-18 NOTE — Telephone Encounter (Signed)
Jimmy [name] is aware of message

## 2019-02-22 ENCOUNTER — Ambulatory Visit
Admission: EM | Admit: 2019-02-22 | Discharge: 2019-02-22 | Disposition: A | Discharge status: Home / Self Care | Payer: Self-pay | Attending: Emergency Medicine | Admitting: Emergency Medicine

## 2019-02-22 ENCOUNTER — Other Ambulatory Visit: Payer: Self-pay

## 2019-02-22 ENCOUNTER — Encounter: Payer: Self-pay | Admitting: Family Medicine

## 2019-02-22 DIAGNOSIS — Z20822 Contact with and (suspected) exposure to covid-19: Secondary | ICD-10-CM

## 2019-02-22 DIAGNOSIS — M25562 Pain in left knee: Secondary | ICD-10-CM

## 2019-02-22 DIAGNOSIS — Z20828 Contact with and (suspected) exposure to other viral communicable diseases: Secondary | ICD-10-CM

## 2019-02-22 DIAGNOSIS — G8929 Other chronic pain: Secondary | ICD-10-CM

## 2019-02-22 MED ORDER — ACETAMINOPHEN 500 MG PO TABS: 500.0000 mg | ORAL_TABLET | Freq: Four times a day (QID) | ORAL | 0 refills | Status: DC | PRN

## 2019-02-22 NOTE — ED Provider Notes (Signed)
[place]   TO:[id number] 02/22/19 Arrival Time: [id number]  CC: Left knee pain; COVID symptoms, test  SUBJECTIVE: History from: patient. Jimmy [name] is a 28 y.o. male complains of chronic LT knee pain x 2-3 years, with recent exacerbation over the past few days.  Denies a precipitating event or specific injury, but states he hits his knee on trailer hitch frequently.  Pain is diffuse about the knee.  Describes the pain as intermittent sharp, dull, and achy.  Has NOT tried OTC medications.  Symptoms are made worse with sitting.  Complains of associated swelling.  Denies fever, chills, erythema, ecchymosis, weakness, numbness and tingling.  Patient also presents for COVID testing.  Reports having sore throat, and dry cough x 3 days.  Denies sick exposure to COVID, flu or strep.  Denies recent travel.  Has tried cinnamon water with honey with relief.  Denies previous COVID infection.   Denies fever, chills, fatigue, nasal congestion, rhinorrhea, SOB, wheezing, chest pain, nausea, vomiting, changes in bowel or bladder habits.    ROS: As per HPI.  All other pertinent ROS negative.     Past Medical History:  Diagnosis Date  . ADHD (attention deficit hyperactivity disorder)   . Anxiety   . Arthritis   . Cancer ([place])    skin cancer  . Chest pain 08/2018  . GERD (gastroesophageal reflux disease)    no meds  . Migraine    migraines due to pinched nerve  . Pinched nerve in neck   . Pinched nerve in neck   . Scoliosis    Past Surgical History:  Procedure Laterality Date  . ESOPHAGUS SURGERY     as a baby  . VENTRAL HERNIA REPAIR N/A 08/31/2018   Procedure: HERNIA REPAIR VENTRAL ADULT;  Surgeon: Olean Ree, MD;  Location: [facility] ORS;  Service: General;  Laterality: N/A;   Allergies  Allergen Reactions  . Cyclobenzaprine Nausea Only and Hives  . Depakote [Divalproex Sodium] Other (See Comments)    "made him crazy"  . Naproxen Nausea Only    Acid reflux  . Other     [facility]  states skin became reddened and he tasted metal (08-2018)  . Ritalin [Methylphenidate Hcl] Other (See Comments)    Increased hyper activeness   . Tramadol Other (See Comments)    Acid reflux   No current facility-administered medications on file prior to encounter.    Current Outpatient Medications on File Prior to Encounter  Medication Sig Dispense Refill  . amitriptyline (ELAVIL) 10 MG tablet Take 1 tablet (10 mg total) by mouth at bedtime. 30 tablet 1  . methocarbamol (ROBAXIN-750) 750 MG tablet Take 1 tablet (750 mg total) by mouth every 8 (eight) hours as needed for muscle spasms. 90 tablet 1  . omeprazole (PRILOSEC) 40 MG capsule Take 40 mg by mouth 2 (two) times daily.    . QUEtiapine (SEROQUEL) 100 MG tablet Take 1 tablet (100 mg total) by mouth at bedtime. 30 tablet 1  . [DISCONTINUED] famotidine (PEPCID) 40 MG tablet Take 1 tablet (40 mg total) by mouth daily. 90 tablet 1   Social History   Socioeconomic History  . Marital status: Married    Spouse name: Not on file  . Number of children: Not on file  . Years of education: Not on file  . Highest education level: Not on file  Occupational History  . Not on file  Social Needs  . Financial resource strain: Not on file  . Food insecurity  Worry: Not on file    Inability: Not on file  . Transportation needs    Medical: Not on file    Non-medical: Not on file  Tobacco Use  . Smoking status: Current Every Day Smoker    Packs/day: 1.00    Years: 15.00    Pack years: 15.00    Types: Cigarettes  . Smokeless tobacco: Current User  Substance and Sexual Activity  . Alcohol use: Yes    Comment: occ  . Drug use: Not Currently    Types: Cocaine, Marijuana  . Sexual activity: Yes  Lifestyle  . Physical activity    Days per week: Not on file    Minutes per session: Not on file  . Stress: Not on file  Relationships  . Social Herbalist on phone: Not on file    Gets together: Not on file    Attends religious  service: Not on file    Active member of club or organization: Not on file    Attends meetings of clubs or organizations: Not on file    Relationship status: Not on file  . Intimate partner violence    Fear of current or ex partner: Not on file    Emotionally abused: Not on file    Physically abused: Not on file    Forced sexual activity: Not on file  Other Topics Concern  . Not on file  Social History Narrative  . Not on file   Family History  Problem Relation Age of Onset  . Diabetes Mother   . Diabetes Maternal Grandmother   . Cancer Maternal Grandmother        unknown type. doing chemo  . Cancer Maternal Grandfather   . Diabetes Maternal Aunt     OBJECTIVE:  Vitals:   02/22/19 1358  BP: (!) 147/81  Pulse: 93  Resp: 16  Temp: 98.1 F (36.7 C)  TempSrc: Oral  SpO2: 99%    General appearance: ALERT; in no acute distress.  HEENT: NCAT; EACs clear, TMs pearly gray; nares patent without rhinorrhea; oropharynx clear, tonsils not enlarged or erythematous, uvula midline Lungs: Normal respiratory effort; CTAB CV: RRR Musculoskeletal: LEFT knee Inspection: Skin warm, dry, clear and intact without obvious erythema, effusion, or ecchymosis.  Palpation: mild diffuse TTP ROM: FROM active and passive Strength:  5/5 knee flexion, 5/5 knee extension, 5/5 dorsiflexion, 5/5 plantar flexion Stability: Anterior/ posterior drawer intact Skin: warm and dry Neurologic: Ambulates without difficulty; Sensation intact about the lower extremities Psychological: alert and cooperative; normal mood and affect  ASSESSMENT & PLAN:  1. Chronic pain of left knee   2. Suspected COVID-19 virus infection   3. Encounter for laboratory testing for COVID-19 virus     Meds ordered this encounter  Medications  . acetaminophen (TYLENOL) 500 MG tablet    Sig: Take 1 tablet (500 mg total) by mouth every 6 (six) hours as needed.    Dispense:  30 tablet    Refill:  0    Order Specific Question:    Supervising Provider    Answer:   Raylene Everts [id number]   LT knee pain: Continue conservative management of rest, ice, and gentle stretches Knee brace given in office Take tylenol as needed for pain relief  Follow up with PCP or orthopedist for further evaluation and management Return or go to the ER if you have any new or worsening symptoms (fever, chills, chest pain, swelling, redness, bruising, worsening symptoms despite medications, etc...)  COVID symptoms/ COVID test: COVID testing ordered.  It will take between 5-7 days for test results.  Someone will contact you regarding abnormal results.    In the meantime: You should remain isolated in your home for 10 days from symptom onset AND greater than 72 hours after symptoms resolution (absence of fever without the use of fever-reducing medication and improvement in respiratory symptoms), whichever is longer Get plenty of rest and push fluids Use OTC zyrtec for nasal congestion, runny nose, and/or sore throat Use OTC flonase prescribed for nasal congestion and runny nose Use medications daily for symptom relief Use OTC medications like ibuprofen or tylenol as needed fever or pain Call or go to the ED if you have any new or worsening symptoms such as fever, worsening cough, shortness of breath, chest tightness, chest pain, turning blue, changes in mental status, etc...   Reviewed expectations re: course of current medical issues. Questions answered. Outlined signs and symptoms indicating need for more acute intervention. Patient verbalized understanding. After Visit Summary given.    Lestine Box, PA-C 02/22/19 1933

## 2019-02-22 NOTE — ED Triage Notes (Signed)
Pt presents to UC stating his left knee has been hurting for 2-3 years and has never gotten it checked. Pt also states he wanted a covid test d/t exposure.

## 2019-02-22 NOTE — Discharge Instructions (Addendum)
LT knee pain: Continue conservative management of rest, ice, and gentle stretches Knee brace given in office Take tylenol as needed for pain relief  Follow up with PCP or orthopedist for further evaluation and management Return or go to the ER if you have any new or worsening symptoms (fever, chills, chest pain, swelling, redness, bruising, worsening symptoms despite medications, etc...)   COVID symptoms/ COVID test: COVID testing ordered.  It will take between 5-7 days for test results.  Someone will contact you regarding abnormal results.    In the meantime: You should remain isolated in your home for 10 days from symptom onset AND greater than 72 hours after symptoms resolution (absence of fever without the use of fever-reducing medication and improvement in respiratory symptoms), whichever is longer Get plenty of rest and push fluids Use OTC zyrtec for nasal congestion, runny nose, and/or sore throat Use OTC flonase prescribed for nasal congestion and runny nose Use medications daily for symptom relief Use OTC medications like ibuprofen or tylenol as needed fever or pain Call or go to the ED if you have any new or worsening symptoms such as fever, worsening cough, shortness of breath, chest tightness, chest pain, turning blue, changes in mental status, etc..Marland Kitchen

## 2019-02-23 ENCOUNTER — Other Ambulatory Visit: Payer: Self-pay | Admitting: Family Medicine

## 2019-02-23 DIAGNOSIS — G43809 Other migraine, not intractable, without status migrainosus: Secondary | ICD-10-CM

## 2019-02-23 LAB — NOVEL CORONAVIRUS, NAA: SARS-CoV-2, NAA: NOT DETECTED

## 2019-02-23 MED ORDER — IMITREX 50 MG PO TABS: 50.0000 mg | ORAL_TABLET | Freq: Once | ORAL | 0 refills | Status: DC

## 2019-02-23 NOTE — Telephone Encounter (Signed)
Patient states that topamax does not work for his headaches.

## 2019-04-15 ENCOUNTER — Other Ambulatory Visit: Payer: Self-pay | Admitting: Family Medicine

## 2019-04-15 DIAGNOSIS — F3161 Bipolar disorder, current episode mixed, mild: Secondary | ICD-10-CM

## 2019-04-15 NOTE — Telephone Encounter (Signed)
No PCP listed. Refill request, last filled 2 months ago.

## 2019-04-19 ENCOUNTER — Encounter: Payer: Self-pay | Admitting: Neurology

## 2019-04-19 NOTE — Progress Notes (Signed)
Virtual Visit via Video Note The purpose of this virtual visit is to provide medical care while limiting exposure to the novel coronavirus.    Consent was obtained for video visit:  Yes.   Answered questions that patient had about telehealth interaction:  Yes.   I discussed the limitations, risks, security and privacy concerns of performing an evaluation and management service by telemedicine. I also discussed with the patient that there may be a patient responsible charge related to this service. The patient expressed understanding and agreed to proceed.  Pt location: Home Physician Location: office Name of referring provider:  Charlott Rakes, MD I connected with Jimmy [name] at patients initiation/request on 04/22/2019 at  7:50 AM EST by video enabled telemedicine application and verified that I am speaking with the correct person using two identifiers. Pt MRN:  [id number] Pt DOB:  1990-11-22 Video Participants:  Jimmy [name]   History of Present Illness:  Jimmy [name] is a 29 year old male with Bipolar disorder who presents for migraines.  History supplemented by referring provider note.  He has history of headaches since 29 years old.  They are left or right-sided pounding/pressure/stabbing pain associated with photophobia, phonophobia but not nausea, vomiting, visual disturbance, paresthesias or unilateral numbness or weakness.  He reports that they are triggered by a "pinched nerve in my neck".  He has associated bilateral neck and shoulder pain.  Cervical X-ray from 05/26/2013 personally reviewed did show mild disc space narrowing at C5-6 but otherwise unremarkable.  No radicular pain, numbness or weakness in the upper extremities.  They are typically persistent, lasting weeks to months.  They may be severe for 2 week periods.  It is usually 4-5/10 intensity but 8/10 when severe.  3 to 4 months he is headache-free between episodes.   Nothing relieves the headache.    01/08/2011 CT BRAIN WO:  No acute intracranial abnormality   Current NSAIDS:  Unable to take due to GI ulcers Current analgesics:  none Current triptans:  none Current ergotamine:  none Current anti-emetic:  none Current muscle relaxants:  Robaxin 750mg  Current anti-anxiolytic:  none Current sleep aide:  none Current Antihypertensive medications:  none Current Antidepressant/antipsychotic medications:  Seroquel 100mg  at bedtime Current Anticonvulsant medications:  none Current anti-CGRP:  none Current Vitamins/Herbal/Supplements:  none Current Antihistamines/Decongestants:  none Other therapy:  none Hormone/birth control:  none  Past NSAIDS:  Unable to take due to GI ulcers Past analgesics:  Tylenol Past abortive triptans:  Sumatriptan 50mg  Past abortive ergotamine:  none Past muscle relaxants:  Flexeril Past anti-emetic:  none Past antihypertensive medications:  none Past antidepressant/antipsychotic medications: amitriptyline 10mg  (caused metallic taste);  quetiapine 100mg  at bedtime Past anticonvulsant medications:  topiramate 50mg  twice daily (caused metallic taste); gabapentin Past anti-CGRP:  none Past vitamins/Herbal/Supplements:  none Past antihistamines/decongestants:  none Other past therapies:  none  Caffeine:  1 cup of coffee daily; no soda Alcohol:  no Smoker:  1 ppd Diet:  No soda.  8 bottles of water daily.  Exercise:  No routine exercise Depression:  yes; Anxiety:  yes Other pain:  Neck and shoulder pain Sleep:  Good with quetiapine Family history of headache:  Mother, aunt, sister  Past Medical History: Past Medical History:  Diagnosis Date  . ADHD (attention deficit hyperactivity disorder)   . Anxiety   . Arthritis   . Cancer ([place])    skin cancer  . Chest pain 08/2018  . GERD (gastroesophageal reflux disease)    no  meds  . Migraine    migraines due to pinched nerve  . Pinched nerve in neck   . Pinched nerve in neck   . Scoliosis      Medications: Outpatient Encounter Medications as of 04/22/2019  Medication Sig  . acetaminophen (TYLENOL) 500 MG tablet Take 1 tablet (500 mg total) by mouth every 6 (six) hours as needed.  Marland Kitchen amitriptyline (ELAVIL) 10 MG tablet Take 1 tablet (10 mg total) by mouth at bedtime.  . methocarbamol (ROBAXIN-750) 750 MG tablet Take 1 tablet (750 mg total) by mouth every 8 (eight) hours as needed for muscle spasms.  Marland Kitchen omeprazole (PRILOSEC) 40 MG capsule Take 40 mg by mouth 2 (two) times daily.  . QUEtiapine (SEROQUEL) 100 MG tablet Take 1 tablet (100 mg total) by mouth at bedtime.  . IMITREX 50 MG tablet Take 1 tablet (50 mg total) by mouth once for 1 dose. May repeat in 2 hours if headache persists or recurs.  . [DISCONTINUED] famotidine (PEPCID) 40 MG tablet Take 1 tablet (40 mg total) by mouth daily.   No facility-administered encounter medications on file as of 04/22/2019.    Allergies: Allergies  Allergen Reactions  . Cyclobenzaprine Nausea Only and Hives  . Depakote [Divalproex Sodium] Other (See Comments)    "made him crazy"  . Naproxen Nausea Only    Acid reflux  . Other     [facility] states skin became reddened and he tasted metal (08-2018)  . Ritalin [Methylphenidate Hcl] Other (See Comments)    Increased hyper activeness   . Tramadol Other (See Comments)    Acid reflux    Family History: Family History  Problem Relation Age of Onset  . Diabetes Mother   . Diabetes Maternal Grandmother   . Cancer Maternal Grandmother        unknown type. doing chemo  . Cancer Maternal Grandfather   . Diabetes Maternal Aunt     Social History: Social History   Socioeconomic History  . Marital status: Married    Spouse name: Not on file  . Number of children: Not on file  . Years of education: Not on file  . Highest education level: Not on file  Occupational History  . Not on file  Tobacco Use  . Smoking status: Current Every Day Smoker    Packs/day: 1.00    Years: 15.00     Pack years: 15.00    Types: Cigarettes  . Smokeless tobacco: Current User  Substance and Sexual Activity  . Alcohol use: Yes    Comment: occ  . Drug use: Not Currently    Types: Cocaine, Marijuana  . Sexual activity: Yes  Other Topics Concern  . Not on file  Social History Narrative   Right handed   One story home    Drinks caffeine    Social Determinants of Health   Financial Resource Strain:   . Difficulty of Paying Living Expenses: Not on file  Food Insecurity:   . Worried About Charity fundraiser in the Last Year: Not on file  . Ran Out of Food in the Last Year: Not on file  Transportation Needs:   . Lack of Transportation (Medical): Not on file  . Lack of Transportation (Non-Medical): Not on file  Physical Activity:   . Days of Exercise per Week: Not on file  . Minutes of Exercise per Session: Not on file  Stress:   . Feeling of Stress : Not on file  Social Connections:   .  Frequency of Communication with Friends and Family: Not on file  . Frequency of Social Gatherings with Friends and Family: Not on file  . Attends Religious Services: Not on file  . Active Member of Clubs or Organizations: Not on file  . Attends Archivist Meetings: Not on file  . Marital Status: Not on file  Intimate Partner Violence:   . Fear of Current or Ex-Partner: Not on file  . Emotionally Abused: Not on file  . Physically Abused: Not on file  . Sexually Abused: Not on file    Observations/Objective:   Height 5\' 2"  (1.575 m), weight 175 lb (79.4 kg). No acute distress.  Alert and oriented.  Speech fluent and not dysarthric.  Language intact.  Eyes orthophoric on primary gaze.  Face symmetric.  Assessment and Plan:   1.  Chronic migraine without aura, without status migrainosus, intractable.   2.  Cervicalgia He has been told by previous treating physicians that he has a "pinched nerve" that was likely causing his headaches.  No imaging available.  I am unclear if that is  truly the cause, but he does have chronic neck pain that may be contributing to his headaches, so I will refer to Sports Medicine for treatment.  1.  Start nortriptyline 10mg , titrating to 30mg  at bedtime.  Typically better tolerated than amitriptyline.  We can increase to 50mg  at bedtime in 6 weeks if needed. 2.  Will have him try rizatriptan for acute severe headaches.  I have told him that the rescue medicine is to abort the severe headaches back to baseline persistent headache but will not completely abort the headache at this time. 3.  Limit use of pain relievers to no more than 2 days out of week to prevent risk of rebound or medication-overuse headache. 4.  Refer to Sports Medicine for chronic neck pain. 5.  Follow up in 4 months.  Follow Up Instructions:    -I discussed the assessment and treatment plan with the patient. The patient was provided an opportunity to ask questions and all were answered. The patient agreed with the plan and demonstrated an understanding of the instructions.   The patient was advised to call back or seek an in-person evaluation if the symptoms worsen or if the condition fails to improve as anticipated.      Dudley Major, DO

## 2019-04-22 ENCOUNTER — Other Ambulatory Visit: Payer: Self-pay

## 2019-04-22 ENCOUNTER — Encounter: Payer: Self-pay | Admitting: Neurology

## 2019-04-22 ENCOUNTER — Telehealth (INDEPENDENT_AMBULATORY_CARE_PROVIDER_SITE_OTHER): Payer: 59 | Admitting: Neurology

## 2019-04-22 VITALS — Ht 62.0 in | Wt 175.0 lb

## 2019-04-22 DIAGNOSIS — G43719 Chronic migraine without aura, intractable, without status migrainosus: Secondary | ICD-10-CM | POA: Diagnosis not present

## 2019-04-22 DIAGNOSIS — M542 Cervicalgia: Secondary | ICD-10-CM

## 2019-04-22 MED ORDER — RIZATRIPTAN BENZOATE 10 MG PO TABS: ORAL_TABLET | ORAL | 3 refills | Status: DC

## 2019-04-22 MED ORDER — NORTRIPTYLINE HCL 10 MG PO CAPS: ORAL_CAPSULE | ORAL | 0 refills | Status: DC

## 2019-04-22 NOTE — Addendum Note (Signed)
Addended by: Jake Seats on: 04/22/2019 09:20 AM   Modules accepted: Orders

## 2019-04-26 ENCOUNTER — Other Ambulatory Visit: Payer: Self-pay

## 2019-04-26 ENCOUNTER — Ambulatory Visit: Payer: 59 | Attending: Family Medicine | Admitting: Family Medicine

## 2019-04-26 ENCOUNTER — Encounter: Payer: Self-pay | Admitting: Family Medicine

## 2019-04-26 ENCOUNTER — Ambulatory Visit (INDEPENDENT_AMBULATORY_CARE_PROVIDER_SITE_OTHER): Payer: 59 | Admitting: Family Medicine

## 2019-04-26 VITALS — BP 110/78 | HR 91 | Ht 62.0 in | Wt 185.4 lb

## 2019-04-26 VITALS — BP 128/74 | HR 76 | Ht 62.0 in | Wt 184.8 lb

## 2019-04-26 DIAGNOSIS — G542 Cervical root disorders, not elsewhere classified: Secondary | ICD-10-CM

## 2019-04-26 DIAGNOSIS — R7401 Elevation of levels of liver transaminase levels: Secondary | ICD-10-CM

## 2019-04-26 DIAGNOSIS — M25511 Pain in right shoulder: Secondary | ICD-10-CM | POA: Diagnosis not present

## 2019-04-26 DIAGNOSIS — R1011 Right upper quadrant pain: Secondary | ICD-10-CM

## 2019-04-26 DIAGNOSIS — M25512 Pain in left shoulder: Secondary | ICD-10-CM | POA: Diagnosis not present

## 2019-04-26 DIAGNOSIS — G43809 Other migraine, not intractable, without status migrainosus: Secondary | ICD-10-CM

## 2019-04-26 DIAGNOSIS — M542 Cervicalgia: Secondary | ICD-10-CM

## 2019-04-26 DIAGNOSIS — F17210 Nicotine dependence, cigarettes, uncomplicated: Secondary | ICD-10-CM

## 2019-04-26 DIAGNOSIS — S161XXA Strain of muscle, fascia and tendon at neck level, initial encounter: Secondary | ICD-10-CM | POA: Diagnosis not present

## 2019-04-26 DIAGNOSIS — Z720 Tobacco use: Secondary | ICD-10-CM

## 2019-04-26 MED ORDER — NICOTINE 14 MG/24HR TD PT24: 14.0000 mg | MEDICATED_PATCH | Freq: Every day | TRANSDERMAL | 1 refills | Status: DC

## 2019-04-26 NOTE — Progress Notes (Signed)
Patient wants to get blood work done to check his liver.  Patient wants to stop smoking.

## 2019-04-26 NOTE — Progress Notes (Signed)
Subjective:    I'm seeing this patient as a consultation for:  Dr. Tomi Likens. Note will be routed back to referring provider/PCP.  CC: Neck and B shoulder pain  I, Molly Weber, LAT, ATC, am serving as scribe for Dr. Lynne Leader.  HPI: Pt is a 29 y/o male presenting w/ c/o neck and B shoulder pain x over 7 years.  He is being followed by Dr. Tomi Likens for migraines and was referred to Sports Medicine for his neck and B shoulder pain.  He was recently prescribed nortriptyline and rizatriptan for his migraines.  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  Radiating pain: From neck into B upper traps and superior scapulae Numbness/tingling: B hands/fingers UE weakness: Yes in B hands and B shoulders Aggravating factors: using a hammer, pulling and pushing activities. Treatments tried: Chiropractor;   Diagnostic testing: C-spine MRI in 2012/2013;   Past medical history, Surgical history, Family history, Social history, Allergies, and medications have been entered into the medical record, reviewed.   Review of Systems: No new headache, visual changes, nausea, vomiting, diarrhea, constipation, dizziness, abdominal pain, skin rash, fevers, chills, night sweats, weight loss, swollen lymph nodes, body aches, joint swelling, muscle aches, chest pain, shortness of breath, mood changes, visual or auditory hallucinations.   Objective:    Vitals:   04/26/19 1434  BP: 110/78  Pulse: 91  SpO2: 97%   General: Well Developed, well nourished, and in no acute distress.  Neuro/Psych: Alert and oriented x3, extra-ocular muscles intact, able to move all 4 extremities, sensation grossly intact. Skin: Warm and dry, no rashes noted.  Respiratory: Not using accessory muscles, speaking in full sentences, trachea midline.  Cardiovascular: Pulses palpable, no extremity edema. Abdomen: Does not  appear distended. MSK:  C-spine: Normal-appearing. Nontender spinal midline. Tender palpation bilateral paraspinal musculature and trapezius however left worse than right. Normal cervical motion however some pain with left lateral flexion and rotation. Upper extremity strength reflexes and sensation equal normal throughout bilateral extremities.  Left shoulder: Normal-appearing Nontender. Normal motion with some palpable click. Left shoulder strength normal abduction external and internal rotation. Negative Hawkins and Neer's test. Negative O'Brien's test.  Right shoulder normal-appearing Nontender. Normal motion with mild palpable click. Shoulder strength is intact. Negative Hawkins and Neer test. Negative O'Brien's test.  Pulses capillary refill and sensation intact bilateral extremities.    Impression and Recommendations:    Assessment and Plan: 29 y.o. male with cervical paraspinal and trapezius pain due to myofascial dysfunction and spasm.  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  He has had imaging in the past though that was reportedly relatively normal.  He does not have any significant upper extremity weakness or neurological symptoms which is reassuring.  Plan for trial of physical therapy.  Will check back in 1 month.  If not improved will obtain cervical spine imaging likely including MRI for potential injection planning.  Precautions reviewed with patient expresses understanding and agreement.Marland Kitchen  PDMP not reviewed this encounter. Orders Placed This Encounter  Procedures  . Ambulatory referral to Physical Therapy    Referral Priority:   Routine    Referral Type:   Physical Medicine    Referral Reason:   Specialty Services Required    Requested Specialty:   Physical Therapy   No orders of the defined types were  placed in this encounter.   Discussed warning signs or symptoms. Please see discharge instructions. Patient  expresses understanding.   The above documentation has been reviewed and is accurate and complete Lynne Leader

## 2019-04-26 NOTE — Patient Instructions (Addendum)
Thank you for coming in today. Attend PT.  Recheck with me in 1 month.  If not doing well let me know we can speed this up.  If you need a form or a work note let me know.

## 2019-04-26 NOTE — Patient Instructions (Signed)
Once I review your test results, I will be in touch with you via 'my chart' or a phone call from my office (if you are not signed up for my chart).  

## 2019-04-26 NOTE — Progress Notes (Signed)
Subjective:  Patient ID: Jimmy [name], male    DOB: 08-06-1990  Age: 29 y.o. MRN: [id number]  CC: Neck Pain and Flank Pain   HPI Jimmy [name] with migraine headaches who presents today for follow-up visit. Diagnosed with ADHD, Bipolar, Anxiety while in jail and was placed on Seroquel. He smokes one and a half pack /day depending on the day and would like help in quitting smoking. He has R sided pain for which he is followed by GI at [facility] Pain is 8/10. He is unsure of aggravating or relieving factor, comes couple of days a week. Currently on PPI which has been beneficial.  Denies presence of nausea, vomiting, constipation or diarrhea.  His job is very physical and he does a lot of lifting.  He also has some neck pain which does not radiate.  For his headached he saw Neurology and placed on Nortriptyline and Maxalt for his headaches with some improvement. He is concerned his labs had revealed elevated liver enzymes and he would like to have that rechecked.  His blood work had revealed AST of 46 (normal 0-44) in 01/2019. Past Medical History:  Diagnosis Date  . ADHD (attention deficit hyperactivity disorder)   . Anxiety   . Arthritis   . Cancer ([place])    skin cancer  . Chest pain 08/2018  . GERD (gastroesophageal reflux disease)    no meds  . Migraine    migraines due to pinched nerve  . Pinched nerve in neck   . Pinched nerve in neck   . Scoliosis     Past Surgical History:  Procedure Laterality Date  . ESOPHAGUS SURGERY     as a baby  . VENTRAL HERNIA REPAIR N/A 08/31/2018   Procedure: HERNIA REPAIR VENTRAL ADULT;  Surgeon: Olean Ree, MD;  Location: [facility] ORS;  Service: General;  Laterality: N/A;    Family History  Problem Relation Age of Onset  . Diabetes Mother   . Diabetes Maternal Grandmother   . Cancer Maternal Grandmother        unknown type. doing chemo  . Cancer Maternal Grandfather   . Diabetes Maternal Aunt     Allergies  Allergen Reactions  .  Cyclobenzaprine Nausea Only and Hives  . Depakote [Divalproex Sodium] Other (See Comments)    "made him crazy"  . Naproxen Nausea Only    Acid reflux  . Other     [facility]-pt states skin became reddened and he tasted metal (08-2018)  . Ritalin [Methylphenidate Hcl] Other (See Comments)    Increased hyper activeness   . Tramadol Other (See Comments)    Acid reflux    Outpatient Medications Prior to Visit  Medication Sig Dispense Refill  . nortriptyline (PAMELOR) 10 MG capsule Take 1 capsule at bedtime for 7 days, then 2 capsules at bedtime for 7 days, then 3 capsules at bedtime. 90 capsule 0  . omeprazole (PRILOSEC) 40 MG capsule Take 40 mg by mouth 2 (two) times daily.    . QUEtiapine (SEROQUEL) 100 MG tablet TAKE 1 TABLET BY MOUTH AT BEDTIME 30 tablet 0  . rizatriptan (MAXALT) 10 MG tablet Take 1 tablet earliest onset of headache.  May repeat in 2 hours if needed.  Maximum 2 tablets in 24 hours. 10 tablet 3  . acetaminophen (TYLENOL) 500 MG tablet Take 1 tablet (500 mg total) by mouth every 6 (six) hours as needed. (Patient not taking: Reported on 04/22/2019) 30 tablet 0  . amitriptyline (ELAVIL) 10 MG  tablet Take 1 tablet (10 mg total) by mouth at bedtime. (Patient not taking: Reported on 04/22/2019) 30 tablet 1  . methocarbamol (ROBAXIN-750) 750 MG tablet Take 1 tablet (750 mg total) by mouth every 8 (eight) hours as needed for muscle spasms. (Patient not taking: Reported on 04/26/2019) 90 tablet 1   No facility-administered medications prior to visit.     ROS Review of Systems  Constitutional: Negative for activity change and appetite change.  HENT: Negative for sinus pressure and sore throat.   Eyes: Negative for visual disturbance.  Respiratory: Negative for cough, chest tightness and shortness of breath.   Cardiovascular: Negative for chest pain and leg swelling.  Gastrointestinal: Positive for abdominal pain. Negative for abdominal distention, constipation and diarrhea.  Endocrine:  Negative.   Genitourinary: Negative for dysuria.  Musculoskeletal: Positive for neck pain. Negative for joint swelling and myalgias.  Skin: Negative for rash.  Allergic/Immunologic: Negative.   Neurological: Negative for weakness, light-headedness and numbness.  Psychiatric/Behavioral: Negative for dysphoric mood and suicidal ideas.    Objective:  BP 128/74   Pulse 76   Ht 5' 2"  (1.575 m)   Wt 184 lb 12.8 oz (83.8 kg)   SpO2 98%   BMI 33.80 kg/m   BP/Weight 04/26/2019 0/04/5850 09/21/8240  Systolic BP 353 614 -  Diastolic BP 78 74 -  Wt. (Lbs) 185.4 184.8 175  BMI 33.91 33.8 32.01      Physical Exam Constitutional:      Appearance: He is well-developed.  Neck:     Vascular: No JVD.  Cardiovascular:     Rate and Rhythm: Normal rate.     Heart sounds: Normal heart sounds. No murmur.  Pulmonary:     Effort: Pulmonary effort is normal.     Breath sounds: Normal breath sounds. No wheezing or rales.  Chest:     Chest wall: No tenderness.  Abdominal:     General: Bowel sounds are normal. There is no distension.     Palpations: Abdomen is soft. There is no mass.     Tenderness: There is no abdominal tenderness.     Comments: Twisting motion of the trunk reproduces right upper quadrant tenderness  Musculoskeletal:        General: Normal range of motion.     Cervical back: No rigidity or tenderness.     Right lower leg: No edema.     Left lower leg: No edema.  Neurological:     Mental Status: He is alert and oriented to person, place, and time.  Psychiatric:        Mood and Affect: Mood normal.     CMP Latest Ref Rng & Units 02/04/2019 03/03/2018 04/29/2016  Glucose 65 - 99 mg/dL 88 118(H) 94  BUN 6 - 20 mg/dL 12 9 10   Creatinine 0.76 - 1.27 mg/dL 0.91 1.10 0.70  Sodium 134 - 144 mmol/L 140 141 141  Potassium 3.5 - 5.2 mmol/L 4.3 3.8 3.8  Chloride 96 - 106 mmol/L 106 108 106  CO2 20 - 29 mmol/L 20 23 -  Calcium 8.7 - 10.2 mg/dL 9.7 9.3 -  Total Protein 6.0 - 8.5 g/dL  7.1 6.5 -  Total Bilirubin 0.0 - 1.2 mg/dL 0.4 0.6 -  Alkaline Phos 39 - 117 IU/L 97 74 -  AST 0 - 40 IU/L 22 16 -  ALT 0 - 44 IU/L 46(H) 17 -    Lipid Panel     Component Value Date/Time   CHOL 194 02/04/2019  0838   TRIG 99 02/04/2019 0838   HDL 45 02/04/2019 0838   CHOLHDL 4.3 02/04/2019 0838   LDLCALC 131 (H) 02/04/2019 0838    CBC    Component Value Date/Time   WBC 8.1 03/03/2018 1253   RBC 4.85 03/03/2018 1253   HGB 13.8 03/03/2018 1253   HCT 43.2 03/03/2018 1253   PLT 291 03/03/2018 1253   MCV 89.1 03/03/2018 1253   MCH 28.5 03/03/2018 1253   MCHC 31.9 03/03/2018 1253   RDW 12.8 03/03/2018 1253   LYMPHSABS 2.6 08/01/2013 1553   MONOABS 0.5 08/01/2013 1553   EOSABS 0.1 08/01/2013 1553   BASOSABS 0.0 08/01/2013 1553    Lab Results  Component Value Date   HGBA1C 5.3 02/04/2019    Assessment & Plan:    1. Right upper quadrant pain Unknown etiology Currently followed by GI Could be a component of muscular etiology given his intense activity at work Placed on methocarbamol  2. Elevated AST (SGOT) Patient reassured as elevation was mild We will check complete metabolic panel - [id number]  3. Other migraine without status migrainosus, not intractable Stable Continue nortriptyline and Maxalt Followed by neurology  4. Tobacco abuse Spent 3 minutes counseling on cessation and hazardous effect of smoking and he is willing to work on quitting - nicotine (NICODERM CQ) 14 mg/24hr patch; Place 1 patch (14 mg total) onto the skin daily.  Dispense: 28 patch; Refill: 1  5.  Neck pain Likely musculoskeletal Refill Robaxin  Charlott Rakes, MD, FAAFP. [facility], [place]   04/26/2019, 4:03 PM

## 2019-04-27 ENCOUNTER — Encounter: Payer: Self-pay | Admitting: Family Medicine

## 2019-04-27 LAB — CMP14+EGFR
ALT: 23 IU/L (ref 0–44)
AST: 14 IU/L (ref 0–40)
Albumin/Globulin Ratio: 2.7 — ABNORMAL HIGH (ref 1.2–2.2)
Albumin: 4.8 g/dL (ref 4.1–5.2)
Alkaline Phosphatase: 84 IU/L (ref 39–117)
BUN/Creatinine Ratio: 8 — ABNORMAL LOW (ref 9–20)
BUN: 6 mg/dL (ref 6–20)
Bilirubin Total: 0.2 mg/dL (ref 0.0–1.2)
CO2: 21 mmol/L (ref 20–29)
Calcium: 9.3 mg/dL (ref 8.7–10.2)
Chloride: 104 mmol/L (ref 96–106)
Creatinine, Ser: 0.8 mg/dL (ref 0.76–1.27)
GFR calc Af Amer: 139 mL/min/{1.73_m2} (ref >59)
GFR calc non Af Amer: 120 mL/min/{1.73_m2} (ref >59)
Globulin, Total: 1.8 g/dL (ref 1.5–4.5)
Glucose: 76 mg/dL (ref 65–99)
Potassium: 4.3 mmol/L (ref 3.5–5.2)
Sodium: 140 mmol/L (ref 134–144)
Total Protein: 6.6 g/dL (ref 6.0–8.5)

## 2019-04-27 MED ORDER — METHOCARBAMOL 750 MG PO TABS: 750.0000 mg | ORAL_TABLET | Freq: Three times a day (TID) | ORAL | 1 refills | Status: DC | PRN

## 2019-04-29 ENCOUNTER — Other Ambulatory Visit: Payer: Self-pay

## 2019-04-29 ENCOUNTER — Ambulatory Visit (INDEPENDENT_AMBULATORY_CARE_PROVIDER_SITE_OTHER): Payer: 59 | Admitting: Physical Therapy

## 2019-04-29 ENCOUNTER — Encounter: Payer: Self-pay | Admitting: Physical Therapy

## 2019-04-29 DIAGNOSIS — M5412 Radiculopathy, cervical region: Secondary | ICD-10-CM | POA: Diagnosis not present

## 2019-05-03 ENCOUNTER — Encounter: Payer: 59 | Admitting: Physical Therapy

## 2019-05-03 ENCOUNTER — Encounter: Payer: Self-pay | Admitting: Physical Therapy

## 2019-05-03 NOTE — Therapy (Signed)
[facility] [address] [place], [name], [address] Phone: [phone]   Fax:  [phone]  Physical Therapy Treatment  Patient Details  Name: Jimmy [name] MRN: [id number] Date of Birth: October 06, 1990 Referring Provider (PT): Lynne Leader   Encounter Date: 04/29/2019  PT End of Session - 05/03/19 0838    Visit Number  1    Number of Visits  12    Date for PT Re-Evaluation  06/03/19    Authorization Type  Aetna    PT Start Time  1105    PT Stop Time  1145    PT Time Calculation (min)  40 min    Activity Tolerance  Patient tolerated treatment well    Behavior During Therapy  [facility] for tasks assessed/performed       Past Medical History:  Diagnosis Date  . ADHD (attention deficit hyperactivity disorder)   . Anxiety   . Arthritis   . Cancer ([place])    skin cancer  . Chest pain 08/2018  . GERD (gastroesophageal reflux disease)    no meds  . Migraine    migraines due to pinched nerve  . Pinched nerve in neck   . Pinched nerve in neck   . Scoliosis     Past Surgical History:  Procedure Laterality Date  . ESOPHAGUS SURGERY     as a baby  . VENTRAL HERNIA REPAIR N/A 08/31/2018   Procedure: HERNIA REPAIR VENTRAL ADULT;  Surgeon: Olean Ree, MD;  Location: [facility] ORS;  Service: General;  Laterality: N/A;    There were no vitals filed for this visit.  Subjective Assessment - 05/03/19 0831    Subjective  Pt states pain in neck and UEs for quite some time, worsening, states Pinched nerve in neck. Reports Tingling into B hands, all fingers, numbness, bettter with hands overhead, sleeps that way . Had [facility].(do not have results)  Also long standing .Migraines: weekly. no vision changes , can still work.  usually on 1 side [place]. Pt states his son has  toxic plasmosis, 44 y/o son, pt wants to get tested for this. Pt works Architect job, has pain and difficulty with work duties, lifting, using UEs, tingling into  fingers.    Limitations  Lifting;Writing;House hold activities    Patient Stated Goals  Decreased pain, tingling, increased use of UEs.    Currently in Pain?  Yes    Pain Score  8     Pain Location  Neck    Pain Orientation  Right;Left    Pain Descriptors / Indicators  Aching;Sharp;Dull    Pain Type  Chronic pain    Pain Onset  More than a month ago    Pain Frequency  Intermittent    Aggravating Factors   increased activity    Pain Relieving Factors  none    Pain Score  8    Pain Location  Arm    Pain Orientation  Right;Left    Pain Descriptors / Indicators  Tingling;Numbness    Pain Type  Chronic pain    Pain Onset  More than a month ago    Pain Frequency  Intermittent         [facility] PT Assessment - 05/03/19 0819      Assessment   Medical Diagnosis  Neck Pain,  Bil shoulder pain    Referring Provider (PT)  Lynne Leader    Hand Dominance  Right    Prior Therapy  no  Balance Screen   Has the patient fallen in the past 6 months  No      Prior Function   Level of Independence  Independent      Cognition   Overall Cognitive Status  Within Functional Limits for tasks assessed      Posture/Postural Control   Posture Comments  Flexible: fwd head and rounded shoulders, poor lumbar posture in steated.       ROM / Strength   AROM / PROM / Strength  AROM;Strength      AROM   Overall AROM Comments  Shoulders: WNL    AROM Assessment Site  Cervical    Cervical Flexion  mild limitation    Cervical Extension  mild limitation/pain    Cervical - Right Side Bend  WNL    Cervical - Left Side Bend  WNL    Cervical - Right Rotation  mild limitation    Cervical - Left Rotation  mild limitation      Strength   Strength Assessment Site  Shoulder;Hand    Right/Left Shoulder  Right;Left    Right Shoulder Flexion  4+/5    Right Shoulder ABduction  4+/5    Right Shoulder Internal Rotation  4+/5    Right Shoulder External Rotation  4+/5    Left Shoulder Flexion  4+/5    Left  Shoulder ABduction  4+/5    Left Shoulder Internal Rotation  4+/5    Left Shoulder External Rotation  4+/5    Right/Left hand  Right;Left    Right Hand Grip (lbs)  80    Left Hand Grip (lbs)  75      Palpation   Palpation comment  Tightness and trigger points in bil UT L>R.;  TIghtness/soreness in bil upper cervical and sub occipitals;        Special Tests   Other special tests  + ULTT                   [facility] Adult PT Treatment/Exercise - 05/03/19 0819      Exercises   Exercises  Neck      Neck Exercises: Seated   Neck Retraction  20 reps    Neck Retraction Limitations  chin tuck    Shoulder Rolls  10 reps    Other Seated Exercise  scap squeeze x20'      Neck Exercises: Stretches   Upper Trapezius Stretch  2 reps;30 seconds    Levator Stretch  2 reps;30 seconds    Chest Stretch  3 reps;30 seconds    Chest Stretch Limitations  supine pec stretch 30 sec x3;              PT Education - 05/03/19 [id number]    Education Details  PT POC, HEP, Exam findings.    Person(s) Educated  Patient    Methods  Explanation;Demonstration;Tactile cues;Verbal cues;Handout    Comprehension  Verbalized understanding;Returned demonstration;Verbal cues required;Tactile cues required;Need further instruction       PT Short Term Goals - 05/03/19 1114      PT SHORT TERM GOAL #1   Title  Pt to demo decreased pain in neck/UT region to 5/10    Time  3    Period  Weeks    Status  New    Target Date  05/20/19      PT SHORT TERM GOAL #2   Title  Pt to demo improved cervical ROM to be [facility]    Time  3  Period  Weeks    Status  New    Target Date  05/20/19        PT Long Term Goals - 05/03/19 1114      PT LONG TERM GOAL #1   Title  Pt to be independent with final HEP    Time  6    Period  Weeks    Status  New    Target Date  06/10/19      PT LONG TERM GOAL #2   Title  Pt to report decreased pain in neck and UEs to 0-2/10 with activity    Time  6    Period  Weeks     Status  New    Target Date  06/10/19      PT LONG TERM GOAL #3   Title  Pt to demo improved strength of bil shoulders, and postural muscles, to be 5/5, to improve pain ability for work duties.    Time  6    Period  Weeks    Status  New    Target Date  06/10/19      PT LONG TERM GOAL #4   Title  Pt to demo proper lifting techniques for upper and lower body, to improve pain and function with work duties.    Time  6    Period  Weeks    Status  New    Target Date  06/10/19            Plan - 05/03/19 1108    Clinical Impression Statement  Pt presents with primary complaint of increased pain in neck and UEs. He has significant tightness in cervical and UT musculature, with increased pain with movement. He has bil UE tingling and numbness with increased activity. He also has unilateral headaches, variable, and migraines. Pt with decreased ability for full functional activities, IADLS, and work duties, due to pain and deficits. Pt to benefit from skilled PT to improve.    Personal Factors and Comorbidities  Comorbidity 1    Comorbidities  migraines, UE numbness /tingling    Examination-Activity Limitations  Locomotion Level;Reach Overhead;Carry;Sleep;Lift    Examination-Participation Restrictions  Cleaning;Community Activity;Driving;Shop;Laundry    Stability/Clinical Decision Making  Evolving/Moderate complexity    Clinical Decision Making  Moderate    Rehab Potential  Good    PT Frequency  2x / week    PT Duration  6 weeks    PT Treatment/Interventions  ADLs/Self Care Home Management;Cryotherapy;Electrical Stimulation;DME Instruction;Ultrasound;Traction;Moist Heat;Iontophoresis 4mg /ml Dexamethasone;Functional mobility training;Therapeutic activities;Therapeutic exercise;Neuromuscular re-education;Manual techniques;Patient/family education;Passive range of motion;Dry needling;Energy conservation;Taping;Visual/perceptual remediation/compensation;Spinal Manipulations;Joint Manipulations     Consulted and Agree with Plan of Care  Patient       Patient will benefit from skilled therapeutic intervention in order to improve the following deficits and impairments:  Decreased range of motion, Impaired UE functional use, Increased muscle spasms, Decreased activity tolerance, Pain, Improper body mechanics, Decreased strength, Decreased mobility  Visit Diagnosis: Radiculopathy, cervical region     Problem List Patient Active Problem List   Diagnosis Date Noted  . Incisional hernia, without obstruction or gangrene 05/20/2018  . Opiate dependence ([place]) 08/02/2013    Lyndee Hensen, PT, DPT 11:19 AM  05/03/19    [facility] [place], [name], [address] Phone: [phone]   Fax:  [phone]  Name: Jimmy [name] MRN: [id number] Date of Birth: 1990/10/23

## 2019-05-03 NOTE — Patient Instructions (Signed)
Upper Trap stretch  30 sec x3 Levator Stretch  30 sec x3 Chin tucks x20 Scap squeeze x20  All 2x/day

## 2019-05-05 ENCOUNTER — Other Ambulatory Visit: Payer: Self-pay

## 2019-05-05 ENCOUNTER — Encounter: Payer: Self-pay | Admitting: Physical Therapy

## 2019-05-05 ENCOUNTER — Ambulatory Visit (INDEPENDENT_AMBULATORY_CARE_PROVIDER_SITE_OTHER): Payer: 59 | Admitting: Physical Therapy

## 2019-05-05 DIAGNOSIS — M5412 Radiculopathy, cervical region: Secondary | ICD-10-CM

## 2019-05-05 NOTE — Therapy (Signed)
[facility] [address] [place], [name], [address] Phone: [phone]   Fax:  [phone]  Physical Therapy Treatment  Patient Details  Name: Jimmy [name] MRN: [id number] Date of Birth: October 23, 1990 Referring Provider (PT): Lynne Leader   Encounter Date: 05/05/2019  PT End of Session - 05/05/19 1300    Visit Number  2    Number of Visits  12    Date for PT Re-Evaluation  06/03/19    Authorization Type  Aetna    PT Start Time  1106    PT Stop Time  1154    PT Time Calculation (min)  48 min    Activity Tolerance  Patient tolerated treatment well    Behavior During Therapy  [facility] for tasks assessed/performed       Past Medical History:  Diagnosis Date  . ADHD (attention deficit hyperactivity disorder)   . Anxiety   . Arthritis   . Cancer ([place])    skin cancer  . Chest pain 08/2018  . GERD (gastroesophageal reflux disease)    no meds  . Migraine    migraines due to pinched nerve  . Pinched nerve in neck   . Pinched nerve in neck   . Scoliosis     Past Surgical History:  Procedure Laterality Date  . ESOPHAGUS SURGERY     as a baby  . VENTRAL HERNIA REPAIR N/A 08/31/2018   Procedure: HERNIA REPAIR VENTRAL ADULT;  Surgeon: Olean Ree, MD;  Location: [facility] ORS;  Service: General;  Laterality: N/A;    There were no vitals filed for this visit.  Subjective Assessment - 05/05/19 1300    Subjective  Pt states pain in neck and shoulders today.    Pain Score  7     Pain Location  Neck    Pain Orientation  Right;Left    Pain Descriptors / Indicators  Aching;Sharp    Pain Type  Chronic pain    Pain Onset  More than a month ago    Pain Frequency  Intermittent                       [facility] Adult PT Treatment/Exercise - 05/05/19 1105      Posture/Postural Control   Posture Comments  Flexible: fwd head and rounded shoulders, poor lumbar posture in steated.       Exercises   Exercises  Neck      Neck  Exercises: Theraband   Rows  20 reps;Green      Neck Exercises: Seated   Neck Retraction  15 reps    Neck Retraction Limitations  chin tuck    Shoulder Rolls  --    Other Seated Exercise  --    Other Seated Exercise  Shoulder pulley x20 flexion ;       Neck Exercises: Supine   Shoulder Flexion  20 reps    Shoulder Flexion Limitations  education on shoulder posture     Other Supine Exercise  Horiz Abd, education on shoulder posture x15;       Modalities   Modalities  Moist Heat      Moist Heat Therapy   Number Minutes Moist Heat  10 Minutes    Moist Heat Location  Cervical      Manual Therapy   Manual Therapy  Joint mobilization;Soft tissue mobilization;Passive ROM;Manual Traction    Manual therapy comments  skilled palpation and monitoring of soft tissue with dry needling  Soft tissue mobilization  SOR, STM to high cervical region     Passive ROM  for UT stretching     Manual Traction  cervical 10 sec x10;       Neck Exercises: Stretches   Upper Trapezius Stretch  2 reps;30 seconds    Levator Stretch  --    Chest Stretch  3 reps;30 seconds    Chest Stretch Limitations  Doorway        Trigger Point Dry Needling - 05/05/19 0001    Consent Given?  Yes    Education Handout Provided  Yes    Muscles Treated Head and Neck  Upper trapezius    Upper Trapezius Response  Twitch reponse elicited;Palpable increased muscle length   Bilateral            PT Short Term Goals - 05/03/19 1114      PT SHORT TERM GOAL #1   Title  Pt to demo decreased pain in neck/UT region to 5/10    Time  3    Period  Weeks    Status  New    Target Date  05/20/19      PT SHORT TERM GOAL #2   Title  Pt to demo improved cervical ROM to be [facility]    Time  3    Period  Weeks    Status  New    Target Date  05/20/19        PT Long Term Goals - 05/03/19 1114      PT LONG TERM GOAL #1   Title  Pt to be independent with final HEP    Time  6    Period  Weeks    Status  New    Target  Date  06/10/19      PT LONG TERM GOAL #2   Title  Pt to report decreased pain in neck and UEs to 0-2/10 with activity    Time  6    Period  Weeks    Status  New    Target Date  06/10/19      PT LONG TERM GOAL #3   Title  Pt to demo improved strength of bil shoulders, and postural muscles, to be 5/5, to improve pain ability for work duties.    Time  6    Period  Weeks    Status  New    Target Date  06/10/19      PT LONG TERM GOAL #4   Title  Pt to demo proper lifting techniques for upper and lower body, to improve pain and function with work duties.    Time  6    Period  Weeks    Status  New    Target Date  06/10/19            Plan - 05/05/19 1301    Clinical Impression Statement  Ther ex for stretching and postural strengthening today, as well as education on shoulder posture with ROM. manual done for SOR and DN for UTs. Pt with significant tightness and soreness in UTs. WIll assess effects of DN next visit.    Personal Factors and Comorbidities  Comorbidity 1    Comorbidities  migraines, UE numbness /tingling    Examination-Activity Limitations  Locomotion Level;Reach Overhead;Carry;Sleep;Lift    Examination-Participation Restrictions  Cleaning;Community Activity;Driving;Shop;Laundry    Stability/Clinical Decision Making  Evolving/Moderate complexity    Rehab Potential  Good    PT Frequency  2x / week  PT Duration  6 weeks    PT Treatment/Interventions  ADLs/Self Care Home Management;Cryotherapy;Electrical Stimulation;DME Instruction;Ultrasound;Traction;Moist Heat;Iontophoresis 4mg /ml Dexamethasone;Functional mobility training;Therapeutic activities;Therapeutic exercise;Neuromuscular re-education;Manual techniques;Patient/family education;Passive range of motion;Dry needling;Energy conservation;Taping;Visual/perceptual remediation/compensation;Spinal Manipulations;Joint Manipulations    Consulted and Agree with Plan of Care  Patient       Patient will benefit from  skilled therapeutic intervention in order to improve the following deficits and impairments:  Decreased range of motion, Impaired UE functional use, Increased muscle spasms, Decreased activity tolerance, Pain, Improper body mechanics, Decreased strength, Decreased mobility  Visit Diagnosis: Radiculopathy, cervical region     Problem List Patient Active Problem List   Diagnosis Date Noted  . Incisional hernia, without obstruction or gangrene 05/20/2018  . Opiate dependence ([place]) 08/02/2013    Lyndee Hensen, PT, DPT 1:03 PM  05/05/19    [facility] [place], [name], [address] Phone: [phone]   Fax:  [phone]  Name: Jimmy [name] MRN: [id number] Date of Birth: 13-Nov-1990

## 2019-05-10 ENCOUNTER — Encounter: Payer: Self-pay | Admitting: Physical Therapy

## 2019-05-10 ENCOUNTER — Ambulatory Visit (INDEPENDENT_AMBULATORY_CARE_PROVIDER_SITE_OTHER): Payer: 59 | Admitting: Physical Therapy

## 2019-05-10 ENCOUNTER — Other Ambulatory Visit: Payer: Self-pay

## 2019-05-10 DIAGNOSIS — M5412 Radiculopathy, cervical region: Secondary | ICD-10-CM

## 2019-05-10 NOTE — Therapy (Signed)
[facility] [address] [place], [name], [address] Phone: [phone]   Fax:  [phone]  Physical Therapy Treatment  Patient Details  Name: Jimmy [name] MRN: [id number] Date of Birth: May 16, 1990 Referring Provider (PT): Lynne Leader   Encounter Date: 05/10/2019  PT End of Session - 05/10/19 1330    Visit Number  3    Number of Visits  12    Date for PT Re-Evaluation  06/03/19    Authorization Type  Aetna    PT Start Time  0803    PT Stop Time  0846    PT Time Calculation (min)  43 min    Activity Tolerance  Patient tolerated treatment well    Behavior During Therapy  [facility] for tasks assessed/performed       Past Medical History:  Diagnosis Date  . ADHD (attention deficit hyperactivity disorder)   . Anxiety   . Arthritis   . Cancer ([place])    skin cancer  . Chest pain 08/2018  . GERD (gastroesophageal reflux disease)    no meds  . Migraine    migraines due to pinched nerve  . Pinched nerve in neck   . Pinched nerve in neck   . Scoliosis     Past Surgical History:  Procedure Laterality Date  . ESOPHAGUS SURGERY     as a baby  . VENTRAL HERNIA REPAIR N/A 08/31/2018   Procedure: HERNIA REPAIR VENTRAL ADULT;  Surgeon: Olean Ree, MD;  Location: [facility] ORS;  Service: General;  Laterality: N/A;    There were no vitals filed for this visit.  Subjective Assessment - 05/10/19 1330    Subjective  Pt states most pain in bil UT region. States minimal pain/tingling into hands this AM.    Currently in Pain?  Yes    Pain Score  6     Pain Location  Neck    Pain Orientation  Right;Left    Pain Descriptors / Indicators  Aching    Pain Type  Chronic pain    Pain Onset  More than a month ago    Pain Frequency  Intermittent                       [facility] Adult PT Treatment/Exercise - 05/10/19 0805      Posture/Postural Control   Posture Comments  Flexible: fwd head and rounded shoulders, poor lumbar posture in  steated.       Exercises   Exercises  Neck      Neck Exercises: Theraband   Shoulder Extension  20 reps;Red    Shoulder Extension Limitations  Low Row     Rows  20 reps;Green    Shoulder External Rotation  20 reps;Red    Shoulder External Rotation Limitations  Bil    Horizontal ABduction  20 reps;Red      Neck Exercises: Standing   Other Standing Exercises  Shoulder flexion AROM for posture and mechanics x10;     Other Standing Exercises  Posture education with holding 10 lb weight, and walking with 10 lb box x50 ft.       Neck Exercises: Seated   Neck Retraction  --    Neck Retraction Limitations  --    Shoulder Rolls  --    Other Seated Exercise  Shoulder pulley x20 flexion ;       Neck Exercises: Supine   Shoulder Flexion  --    Shoulder Flexion Limitations  --  Other Supine Exercise  --      Neck Exercises: Prone   Other Prone Exercise  --      Modalities   Modalities  Moist Heat      Moist Heat Therapy   Moist Heat Location  --      Manual Therapy   Manual Therapy  Joint mobilization;Soft tissue mobilization;Passive ROM;Manual Traction    Manual therapy comments  --    Soft tissue mobilization  SOR, STM to high cervical region , DTM and IASTM to bil UTs     Passive ROM  --    Manual Traction  cervical 10 sec x10;       Neck Exercises: Stretches   Upper Trapezius Stretch  2 reps;30 seconds    Chest Stretch  3 reps;30 seconds    Chest Stretch Limitations  Doorway                PT Short Term Goals - 05/03/19 1114      PT SHORT TERM GOAL #1   Title  Pt to demo decreased pain in neck/UT region to 5/10    Time  3    Period  Weeks    Status  New    Target Date  05/20/19      PT SHORT TERM GOAL #2   Title  Pt to demo improved cervical ROM to be [facility]    Time  3    Period  Weeks    Status  New    Target Date  05/20/19        PT Long Term Goals - 05/03/19 1114      PT LONG TERM GOAL #1   Title  Pt to be independent with final HEP    Time   6    Period  Weeks    Status  New    Target Date  06/10/19      PT LONG TERM GOAL #2   Title  Pt to report decreased pain in neck and UEs to 0-2/10 with activity    Time  6    Period  Weeks    Status  New    Target Date  06/10/19      PT LONG TERM GOAL #3   Title  Pt to demo improved strength of bil shoulders, and postural muscles, to be 5/5, to improve pain ability for work duties.    Time  6    Period  Weeks    Status  New    Target Date  06/10/19      PT LONG TERM GOAL #4   Title  Pt to demo proper lifting techniques for upper and lower body, to improve pain and function with work duties.    Time  6    Period  Weeks    Status  New    Target Date  06/10/19            Plan - 05/10/19 1331    Clinical Impression Statement  Pt with less pain in high cervical region today from previous visit. Does have signifcant tightness and soreness in bil UT region, will benefit from continued manual therapy and DN for this. Much education on posture and UE mechanics with movement today, will benefit from continued education on this.    Personal Factors and Comorbidities  Comorbidity 1    Comorbidities  migraines, UE numbness /tingling    Examination-Activity Limitations  Locomotion Level;Reach Overhead;Carry;Sleep;Lift    Examination-Participation Restrictions  Cleaning;Community Activity;Driving;Shop;Laundry    Stability/Clinical Decision Making  Evolving/Moderate complexity    Rehab Potential  Good    PT Frequency  2x / week    PT Duration  6 weeks    PT Treatment/Interventions  ADLs/Self Care Home Management;Cryotherapy;Electrical Stimulation;DME Instruction;Ultrasound;Traction;Moist Heat;Iontophoresis 4mg /ml Dexamethasone;Functional mobility training;Therapeutic activities;Therapeutic exercise;Neuromuscular re-education;Manual techniques;Patient/family education;Passive range of motion;Dry needling;Energy conservation;Taping;Visual/perceptual remediation/compensation;Spinal  Manipulations;Joint Manipulations    Consulted and Agree with Plan of Care  Patient       Patient will benefit from skilled therapeutic intervention in order to improve the following deficits and impairments:  Decreased range of motion, Impaired UE functional use, Increased muscle spasms, Decreased activity tolerance, Pain, Improper body mechanics, Decreased strength, Decreased mobility  Visit Diagnosis: Radiculopathy, cervical region     Problem List Patient Active Problem List   Diagnosis Date Noted  . Incisional hernia, without obstruction or gangrene 05/20/2018  . Opiate dependence ([place]) 08/02/2013    Lyndee Hensen, PT, DPT 1:33 PM  05/10/19    [place], [name], [address] Phone: [phone]   Fax:  [phone]  Name: Jimmy [name] MRN: [id number] Date of Birth: 1990/06/20

## 2019-05-10 NOTE — Patient Instructions (Signed)
Access Code: [email or web address]  URL: [email or web address]  Date: 05/10/2019  Prepared by: Lyndee Hensen   Exercises Scapular Retraction with Resistance - 10 reps - 2 sets - 1x daily Shoulder External Rotation and Scapular Retraction with Resistance - 10 reps - 2 sets - 1x daily

## 2019-05-13 ENCOUNTER — Other Ambulatory Visit: Payer: Self-pay

## 2019-05-13 ENCOUNTER — Ambulatory Visit (INDEPENDENT_AMBULATORY_CARE_PROVIDER_SITE_OTHER): Payer: 59 | Admitting: Physical Therapy

## 2019-05-13 DIAGNOSIS — M5412 Radiculopathy, cervical region: Secondary | ICD-10-CM | POA: Diagnosis not present

## 2019-05-16 ENCOUNTER — Encounter: Payer: Self-pay | Admitting: Physical Therapy

## 2019-05-16 NOTE — Therapy (Signed)
[facility] [address] [place], [name], [address] Phone: [phone]   Fax:  [phone]  Physical Therapy Treatment  Patient Details  Name: Jimmy [name] MRN: [id number] Date of Birth: 26-Jul-1990 Referring Provider (PT): Lynne Leader   Encounter Date: 05/13/2019  PT End of Session - 05/16/19 2142    Visit Number  4    Number of Visits  12    Date for PT Re-Evaluation  06/03/19    Authorization Type  Aetna    PT Start Time  0804    PT Stop Time  0846    PT Time Calculation (min)  42 min    Activity Tolerance  Patient tolerated treatment well    Behavior During Therapy  [facility] for tasks assessed/performed       Past Medical History:  Diagnosis Date  . ADHD (attention deficit hyperactivity disorder)   . Anxiety   . Arthritis   . Cancer ([place])    skin cancer  . Chest pain 08/2018  . GERD (gastroesophageal reflux disease)    no meds  . Migraine    migraines due to pinched nerve  . Pinched nerve in neck   . Pinched nerve in neck   . Scoliosis     Past Surgical History:  Procedure Laterality Date  . ESOPHAGUS SURGERY     as a baby  . VENTRAL HERNIA REPAIR N/A 08/31/2018   Procedure: HERNIA REPAIR VENTRAL ADULT;  Surgeon: Olean Ree, MD;  Location: [facility] ORS;  Service: General;  Laterality: N/A;    There were no vitals filed for this visit.  Subjective Assessment - 05/16/19 2137    Subjective  Pt states continued pain, mostly in R UT region, and into hands.    Currently in Pain?  Yes    Pain Score  6     Pain Location  Neck    Pain Orientation  Right;Left    Pain Descriptors / Indicators  Aching    Pain Type  Chronic pain    Pain Onset  More than a month ago    Pain Frequency  Intermittent                       [facility] Adult PT Treatment/Exercise - 05/16/19 0001      Posture/Postural Control   Posture Comments  Flexible: fwd head and rounded shoulders, poor lumbar posture in steated.        Exercises   Exercises  Neck      Neck Exercises: Theraband   Rows  20 reps;Green    Shoulder External Rotation  20 reps;Red    Shoulder External Rotation Limitations  Bil    Horizontal ABduction  20 reps;Red      Neck Exercises: Standing   Other Standing Exercises  Wall angel x10;       Neck Exercises: Seated   Neck Retraction  20 reps    Neck Retraction Limitations  chin tuck      Modalities   Modalities  Moist Heat;Traction      Traction   Type of Traction  Cervical    Max (lbs)  16    Hold Time  58min x2    Time  10      Manual Therapy   Manual Therapy  Joint mobilization;Soft tissue mobilization;Passive ROM;Manual Traction    Manual therapy comments  skilled palpation and monitoring of soft tissue with dry needling     Soft tissue mobilization  SOR, STM to high cervical region , DTM  to bil UTs       Neck Exercises: Stretches   Upper Trapezius Stretch  2 reps;30 seconds    Chest Stretch  3 reps;30 seconds    Chest Stretch Limitations  Doorway        Trigger Point Dry Needling - 05/16/19 0001    Consent Given?  Yes    Education Handout Provided  Previously provided    Muscles Treated Head and Neck  Upper trapezius    Upper Trapezius Response  Twitch reponse elicited;Palpable increased muscle length             PT Short Term Goals - 05/03/19 1114      PT SHORT TERM GOAL #1   Title  Pt to demo decreased pain in neck/UT region to 5/10    Time  3    Period  Weeks    Status  New    Target Date  05/20/19      PT SHORT TERM GOAL #2   Title  Pt to demo improved cervical ROM to be [facility]    Time  3    Period  Weeks    Status  New    Target Date  05/20/19        PT Long Term Goals - 05/03/19 1114      PT LONG TERM GOAL #1   Title  Pt to be independent with final HEP    Time  6    Period  Weeks    Status  New    Target Date  06/10/19      PT LONG TERM GOAL #2   Title  Pt to report decreased pain in neck and UEs to 0-2/10 with activity    Time  6     Period  Weeks    Status  New    Target Date  06/10/19      PT LONG TERM GOAL #3   Title  Pt to demo improved strength of bil shoulders, and postural muscles, to be 5/5, to improve pain ability for work duties.    Time  6    Period  Weeks    Status  New    Target Date  06/10/19      PT LONG TERM GOAL #4   Title  Pt to demo proper lifting techniques for upper and lower body, to improve pain and function with work duties.    Time  6    Period  Weeks    Status  New    Target Date  06/10/19            Plan - 05/16/19 2143    Clinical Impression Statement  Pt with continued pain into UEs. Manual done for muscle tightness and soreness, and Traction done for radicular pain, will assess effects next visit. Pt req cueing with ther ex for head and shoulder posture. Pt to benefit from continued care.    Personal Factors and Comorbidities  Comorbidity 1    Comorbidities  migraines, UE numbness /tingling    Examination-Activity Limitations  Locomotion Level;Reach Overhead;Carry;Sleep;Lift    Examination-Participation Restrictions  Cleaning;Community Activity;Driving;Shop;Laundry    Stability/Clinical Decision Making  Evolving/Moderate complexity    Rehab Potential  Good    PT Frequency  2x / week    PT Duration  6 weeks    PT Treatment/Interventions  ADLs/Self Care Home Management;Cryotherapy;Electrical Stimulation;DME Instruction;Ultrasound;Traction;Moist Heat;Iontophoresis 4mg /ml Dexamethasone;Functional mobility training;Therapeutic activities;Therapeutic exercise;Neuromuscular re-education;Manual  techniques;Patient/family education;Passive range of motion;Dry needling;Energy conservation;Taping;Visual/perceptual remediation/compensation;Spinal Manipulations;Joint Manipulations    Consulted and Agree with Plan of Care  Patient       Patient will benefit from skilled therapeutic intervention in order to improve the following deficits and impairments:  Decreased range of motion,  Impaired UE functional use, Increased muscle spasms, Decreased activity tolerance, Pain, Improper body mechanics, Decreased strength, Decreased mobility  Visit Diagnosis: Radiculopathy, cervical region     Problem List Patient Active Problem List   Diagnosis Date Noted  . Incisional hernia, without obstruction or gangrene 05/20/2018  . Opiate dependence ([place]) 08/02/2013    Lyndee Hensen, PT, DPT 9:45 PM  05/16/19    [facility] [place], [name], [address] Phone: [phone]   Fax:  [phone]  Name: CLARKE BLAKER MRN: [id number] Date of Birth: May 07, 1990

## 2019-05-17 ENCOUNTER — Encounter: Payer: 59 | Admitting: Physical Therapy

## 2019-05-20 ENCOUNTER — Ambulatory Visit (INDEPENDENT_AMBULATORY_CARE_PROVIDER_SITE_OTHER): Payer: 59 | Admitting: Physical Therapy

## 2019-05-20 ENCOUNTER — Other Ambulatory Visit: Payer: Self-pay

## 2019-05-20 ENCOUNTER — Encounter: Payer: 59 | Admitting: Physical Therapy

## 2019-05-20 DIAGNOSIS — M5412 Radiculopathy, cervical region: Secondary | ICD-10-CM

## 2019-05-23 ENCOUNTER — Encounter: Payer: Self-pay | Admitting: Physical Therapy

## 2019-05-23 NOTE — Therapy (Addendum)
[facility] [address] [place], [name], [address] Phone: [phone]   Fax:  [phone]  Physical Therapy Treatment  Patient Details  Name: Jimmy [name] MRN: [id number] Date of Birth: [birth year] Referring Provider (PT): Lynne Leader   Encounter Date: 05/20/2019  PT End of Session - 05/23/19 1528    Visit Number  5    Number of Visits  12    Date for PT Re-Evaluation  06/03/19    Authorization Type  Aetna    PT Start Time  1583    PT Stop Time  1435    PT Time Calculation (min)  50 min    Activity Tolerance  Patient tolerated treatment well    Behavior During Therapy  [facility] for tasks assessed/performed       Past Medical History:  Diagnosis Date  . ADHD (attention deficit hyperactivity disorder)   . Anxiety   . Arthritis   . Cancer ([place])    skin cancer  . Chest pain 08/2018  . GERD (gastroesophageal reflux disease)    no meds  . Migraine    migraines due to pinched nerve  . Pinched nerve in neck   . Pinched nerve in neck   . Scoliosis     Past Surgical History:  Procedure Laterality Date  . ESOPHAGUS SURGERY     as a baby  . VENTRAL HERNIA REPAIR N/A 08/31/2018   Procedure: HERNIA REPAIR VENTRAL ADULT;  Surgeon: Olean Ree, MD;  Location: [facility] ORS;  Service: General;  Laterality: N/A;    There were no vitals filed for this visit.  Subjective Assessment - 05/23/19 1526    Subjective  Pt continues to have pain in UT region, and tingling into UEs with activity. Does state L UT tightness/pain improving. Did have slight headache from traction asfter last visit.    Currently in Pain?  Yes    Pain Score  6     Pain Location  Neck    Pain Orientation  Right    Pain Descriptors / Indicators  Aching    Pain Type  Chronic pain    Pain Onset  More than a month ago    Pain Frequency  Intermittent    Pain Score  6    Pain Location  Arm    Pain Orientation  Left;Right    Pain Descriptors / Indicators  Tingling     Pain Type  Chronic pain    Pain Onset  More than a month ago    Pain Frequency  Intermittent                       [facility] Adult PT Treatment/Exercise - 05/23/19 1520      Posture/Postural Control   Posture Comments  Flexible: fwd head and rounded shoulders, poor lumbar posture in steated.       Exercises   Exercises  Neck      Neck Exercises: Machines for Strengthening   UBE (Upper Arm Bike)  x 3 min;       Neck Exercises: Theraband   Rows  20 reps;Green    Shoulder External Rotation  20 reps;Red    Shoulder External Rotation Limitations  Bil    Horizontal ABduction  --      Neck Exercises: Standing   Other Standing Exercises  Shoulder flexion AROM for posture and mechanics x10;     Other Standing Exercises  --      Neck  Exercises: Seated   Neck Retraction  10 reps    Neck Retraction Limitations  chin tuck      Modalities   Modalities  Moist Heat;Traction      Traction   Type of Traction  Cervical    Max (lbs)  16    Hold Time  36mn x2    Time  10      Manual Therapy   Manual Therapy  Joint mobilization;Soft tissue mobilization;Passive ROM;Manual Traction    Manual therapy comments  skilled palpation and monitoring of soft tissue with dry needling     Soft tissue mobilization  SOR, STM to high cervical region , DTM  to bil UTs       Neck Exercises: Stretches   Upper Trapezius Stretch  2 reps;30 seconds    Chest Stretch  3 reps;30 seconds    Chest Stretch Limitations  Doorway        Trigger Point Dry Needling - 05/23/19 0001    Consent Given?  Yes    Education Handout Provided  Previously provided    Muscles Treated Head and Neck  Upper trapezius    Upper Trapezius Response  Twitch reponse elicited;Palpable increased muscle length   R            PT Short Term Goals - 05/03/19 1114      PT SHORT TERM GOAL #1   Title  Pt to demo decreased pain in neck/UT region to 5/10    Time  3    Period  Weeks    Status  New    Target Date   05/20/19      PT SHORT TERM GOAL #2   Title  [facility]   Time  3    Period  Weeks    Status  New    Target Date  05/20/19        PT Long Term Goals - 05/03/19 1114      PT LONG TERM GOAL #1   Title  Pt to be independent with final HEP    Time  6    Period  Weeks    Status  New    Target Date  06/10/19      PT LONG TERM GOAL #2   Title  Pt to report decreased pain in neck and UEs to 0-2/10 with activity    Time  6    Period  Weeks    Status  New    Target Date  06/10/19      PT LONG TERM GOAL #3   Title  Pt to demo improved strength of bil shoulders, and postural muscles, to be 5/5, to improve pain ability for work duties.    Time  6    Period  Weeks    Status  New    Target Date  06/10/19      PT LONG TERM GOAL #4   Title  Pt to demo proper lifting techniques for upper and lower body, to improve pain and function with work duties.    Time  6    Period  Weeks    Status  New    Target Date  06/10/19            Plan - 05/23/19 1530    Clinical Impression Statement  Traction continued for Radicular pain. R UT continues to be tight and sore, DN done today as well to relieve. Pt  req mod/max cuing for posture with ther ex. L UT less sore, and much decreased tightness. Pt continues to report bothersome pain daily, and will benefit from continued care.    Personal Factors and Comorbidities  Comorbidity 1    Comorbidities  migraines, UE numbness /tingling    Examination-Activity Limitations  Locomotion Level;Reach Overhead;Carry;Sleep;Lift    Examination-Participation Restrictions  Cleaning;Community Activity;Driving;Shop;Laundry    Stability/Clinical Decision Making  Evolving/Moderate complexity    Rehab Potential  Good    PT Frequency  2x / week    PT Duration  6 weeks    PT Treatment/Interventions  ADLs/Self Care Home Management;Cryotherapy;Electrical Stimulation;DME Instruction;Ultrasound;Traction;Moist Heat;Iontophoresis 89m/ml  Dexamethasone;Functional mobility training;Therapeutic activities;Therapeutic exercise;Neuromuscular re-education;Manual techniques;Patient/family education;Passive range of motion;Dry needling;Energy conservation;Taping;Visual/perceptual remediation/compensation;Spinal Manipulations;Joint Manipulations    Consulted and Agree with Plan of Care  Patient       Patient will benefit from skilled therapeutic intervention in order to improve the following deficits and impairments:  Decreased range of motion, Impaired UE functional use, Increased muscle spasms, Decreased activity tolerance, Pain, Improper body mechanics, Decreased strength, Decreased mobility  Visit Diagnosis: Radiculopathy, cervical region     Problem List Patient Active Problem List   Diagnosis Date Noted  . Incisional hernia, without obstruction or gangrene 05/20/2018  . [place]   LLyndee Hensen PT, DPT 3:32 PM  05/23/19    [facility] [place] [address]Phone: [phone]  Fax:  [phone] Name: MNISAIAH BECHTOLMRN: [id number] of Birth: 2Nov 15, 1992  PHYSICAL THERAPY DISCHARGE SUMMARY  Visits from Start of Care: 5 Plan: Patient agrees to discharge.  Patient goals were partially met. Patient is being discharged due to not returning since the last visit.  ?????     LLyndee Hensen PT, DPT 10:12 PM  09/27/19

## 2019-05-24 ENCOUNTER — Other Ambulatory Visit: Payer: Self-pay

## 2019-05-24 ENCOUNTER — Encounter: Payer: Self-pay | Admitting: Family Medicine

## 2019-05-24 ENCOUNTER — Telehealth: Payer: Self-pay | Admitting: Family Medicine

## 2019-05-24 ENCOUNTER — Ambulatory Visit (INDEPENDENT_AMBULATORY_CARE_PROVIDER_SITE_OTHER): Payer: 59

## 2019-05-24 ENCOUNTER — Ambulatory Visit (INDEPENDENT_AMBULATORY_CARE_PROVIDER_SITE_OTHER): Payer: 59 | Admitting: Family Medicine

## 2019-05-24 VITALS — BP 140/92 | HR 88 | Ht 62.0 in | Wt 178.4 lb

## 2019-05-24 DIAGNOSIS — M25511 Pain in right shoulder: Secondary | ICD-10-CM | POA: Diagnosis not present

## 2019-05-24 DIAGNOSIS — M542 Cervicalgia: Secondary | ICD-10-CM | POA: Diagnosis not present

## 2019-05-24 DIAGNOSIS — M5412 Radiculopathy, cervical region: Secondary | ICD-10-CM

## 2019-05-24 DIAGNOSIS — M25512 Pain in left shoulder: Secondary | ICD-10-CM

## 2019-05-24 DIAGNOSIS — R200 Anesthesia of skin: Secondary | ICD-10-CM

## 2019-05-24 DIAGNOSIS — M7041 Prepatellar bursitis, right knee: Secondary | ICD-10-CM

## 2019-05-24 IMAGING — DX DG CERVICAL SPINE COMPLETE 4+V
5 series · 5 of 5 positions shown · non-contrast
Comparison: [DATE] cervical spine radiographs

CLINICAL DATA: Chronic neck pain. No reported injury.

EXAM:
CERVICAL SPINE - COMPLETE 4+ VIEW

[cervical spine lat]
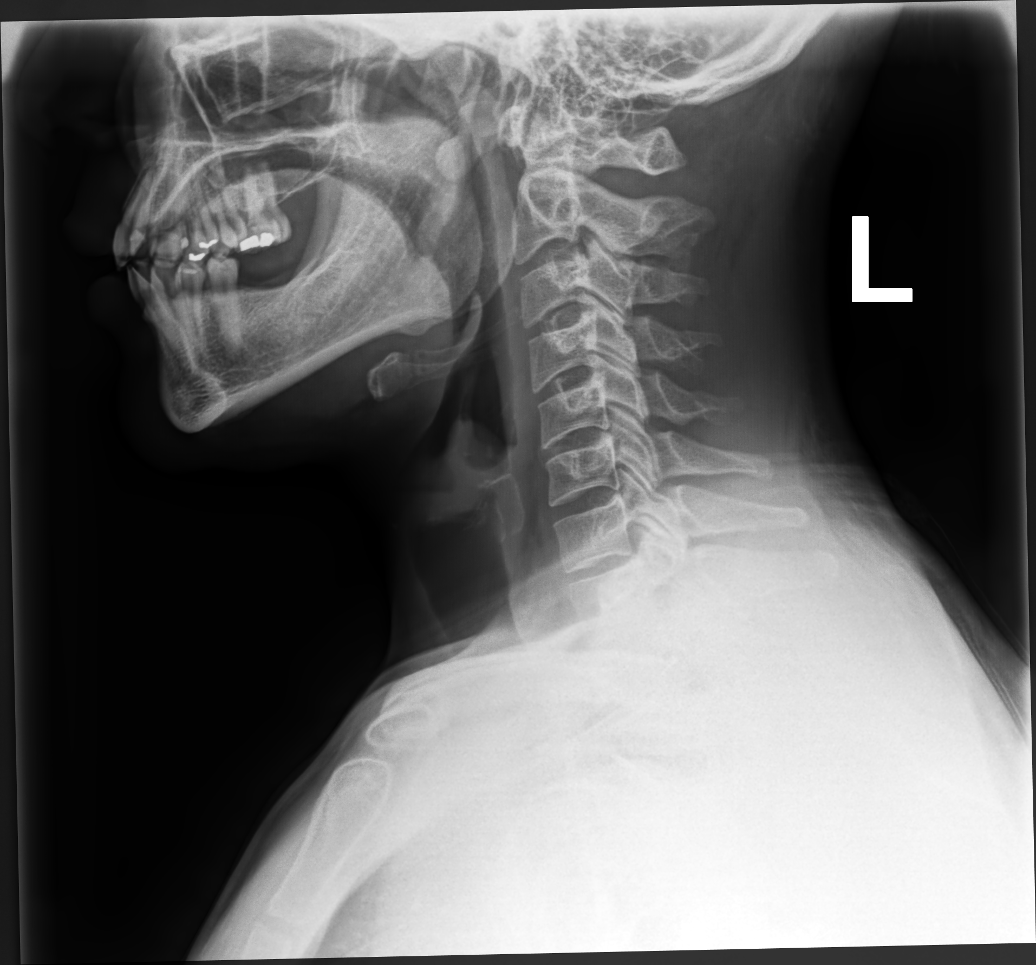

[cervical spine mlo (1 of 2)]
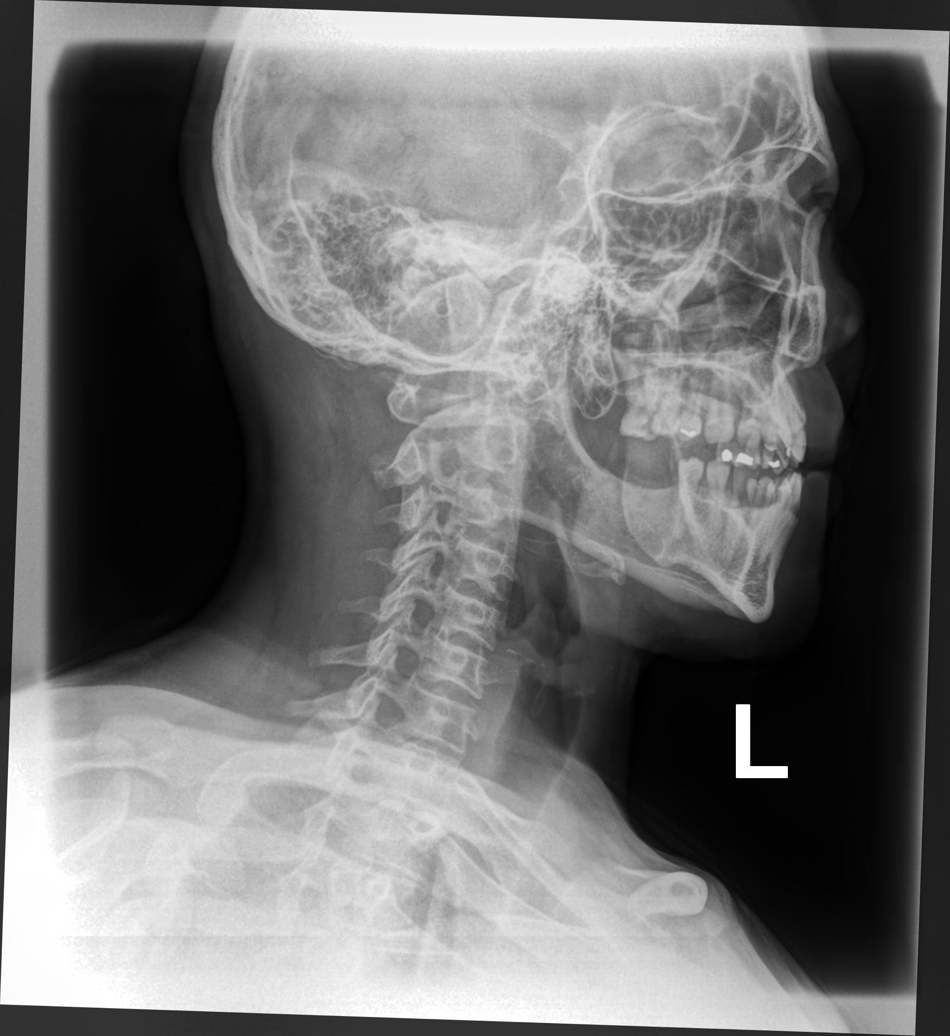

[cervical spine mlo (2 of 2)]
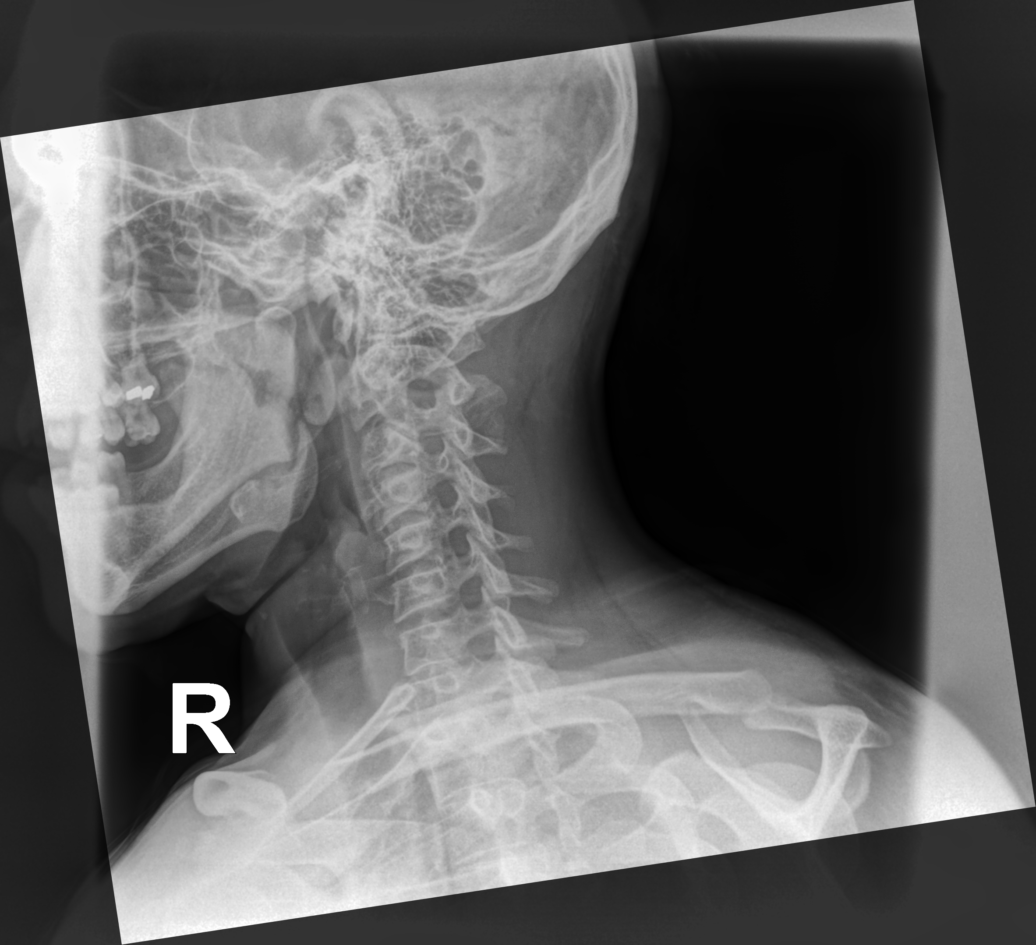

[cervical spine ap]
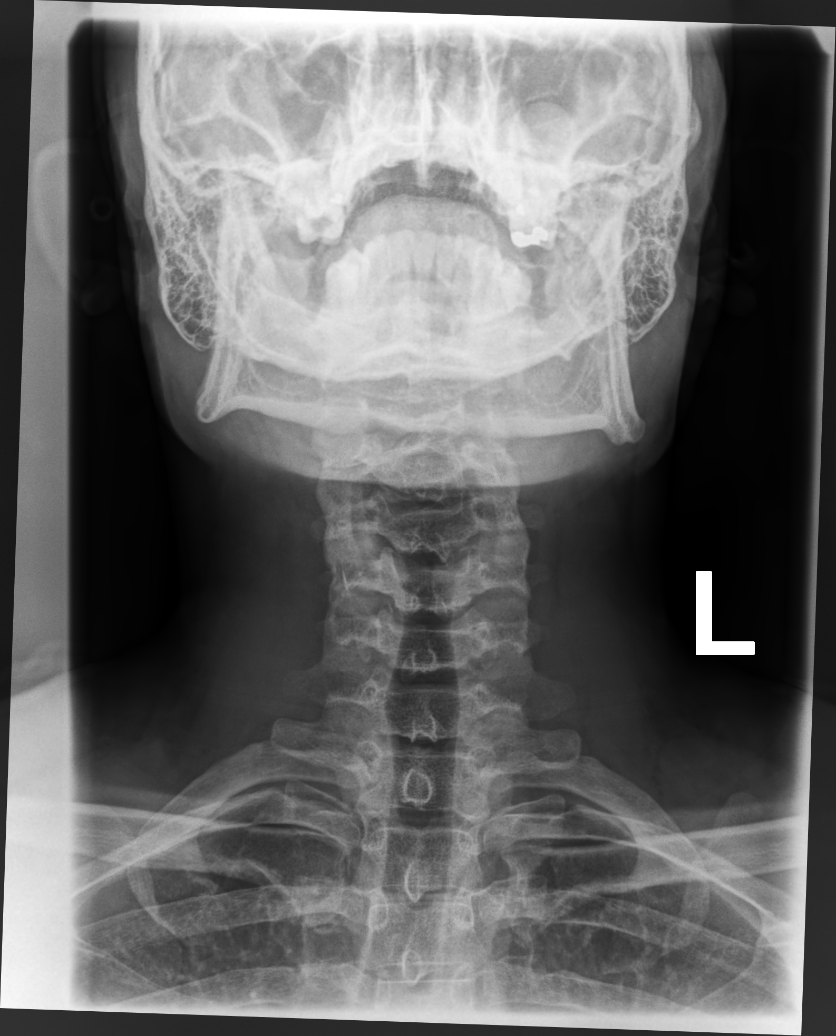

[cervical spine open mouth ap]
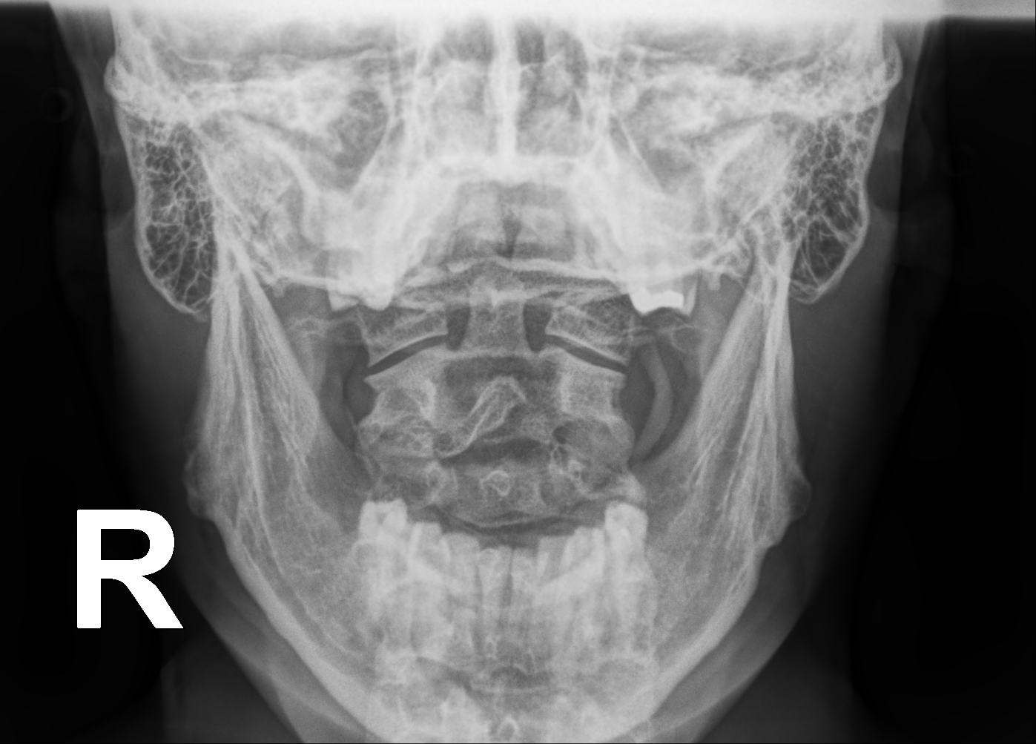

[5 of 5 positions shown; findings below may reference images not displayed]

FINDINGS: On the lateral view the cervical spine is visualized to the level of
C7-T1. Straightening of the cervical spine. Pre-vertebral soft
tissues are within normal limits. No fracture is detected in the
cervical spine. Dens is well positioned between the lateral masses
of C1. Cervical disc heights are preserved, with no appreciable
spondylosis. No cervical spine subluxation. Mild facet arthropathy
in the right with mild degenerative foraminal stenosis at C3-4 and
C4-5. No left-sided degenerative foraminal stenosis. No
aggressive-appearing focal osseous lesions.
IMPRESSION: 1. Straightening of the cervical spine, usually due to positioning
and/or muscle spasm.
2. Mild right facet arthropathy with mild degenerative foraminal
stenosis at C3-4 and C4-5.

## 2019-05-24 IMAGING — DX DG SHOULDER 2+V*R*
3 series · 3 of 3 positions shown · non-contrast
Comparison: None.

CLINICAL DATA: Right shoulder pain.

EXAM:
RIGHT SHOULDER - 2+ VIEW

[shoulder (grashey view) ap]
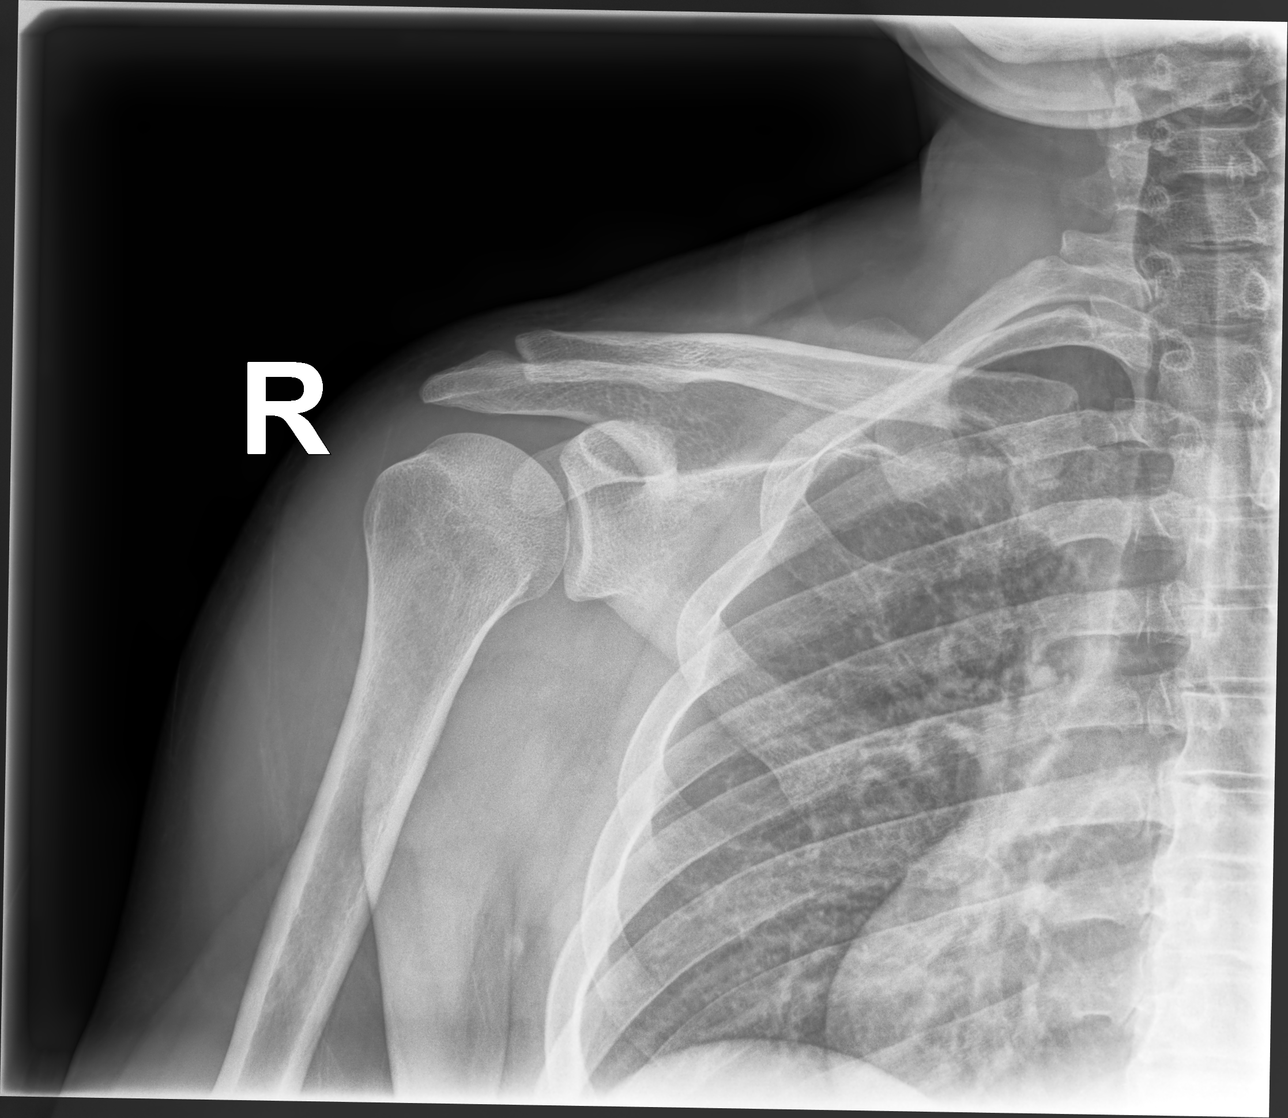

[shoulder (y view) lat]
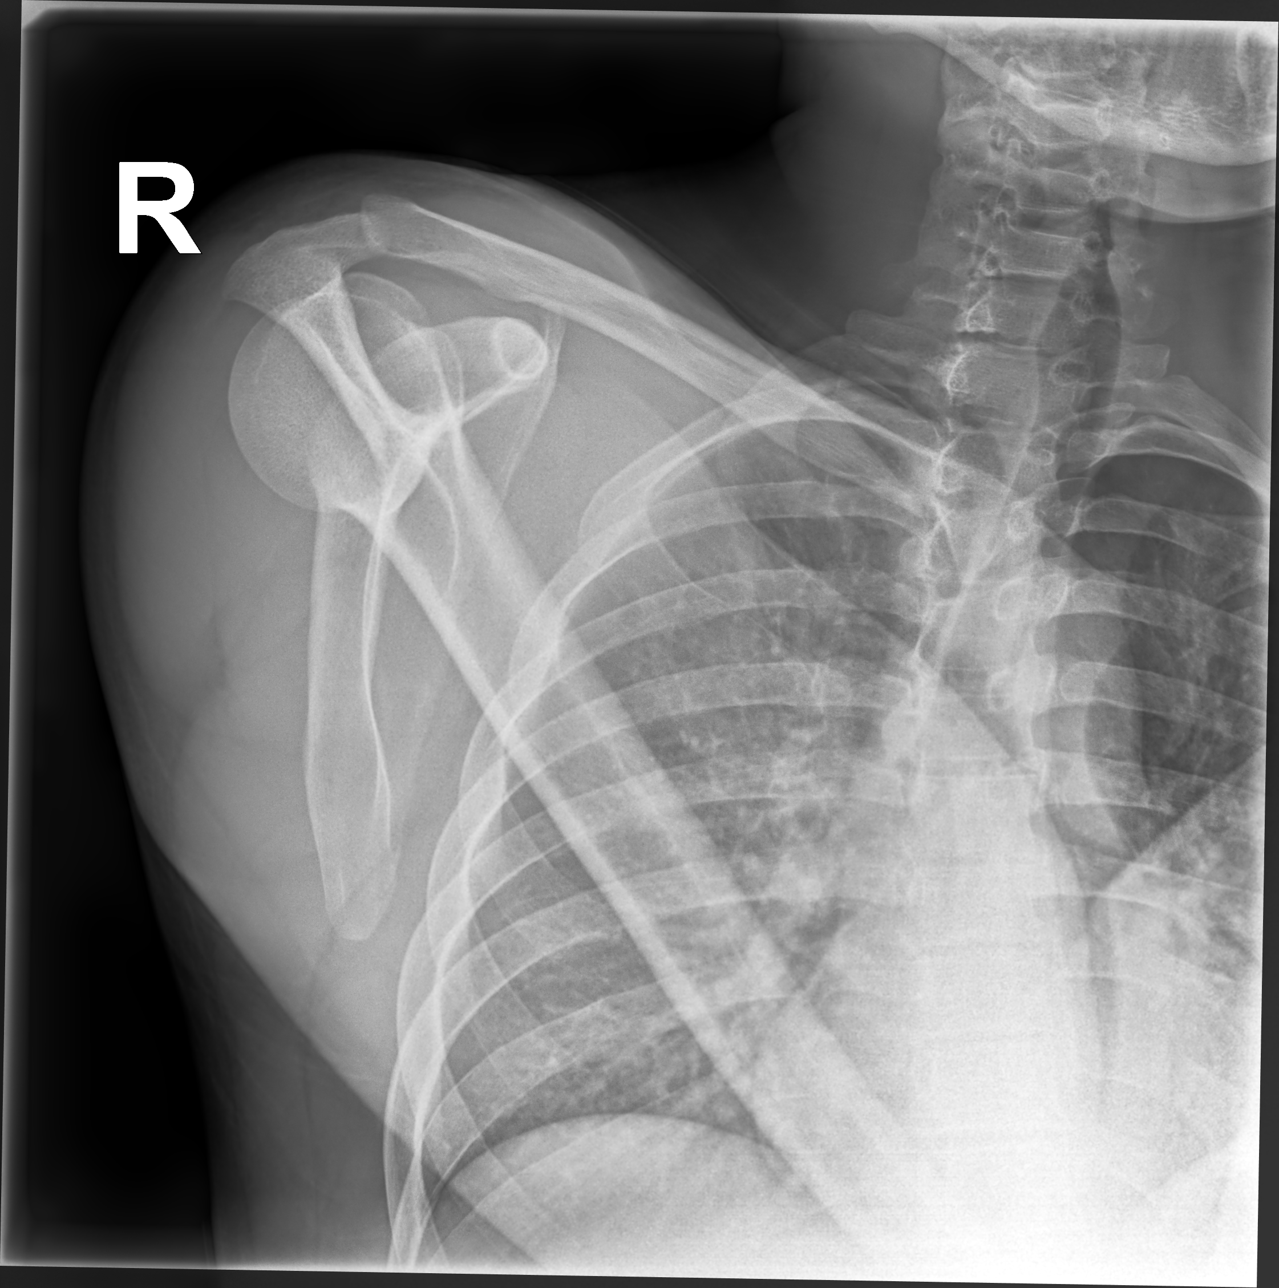

[shoulder axial 45°]
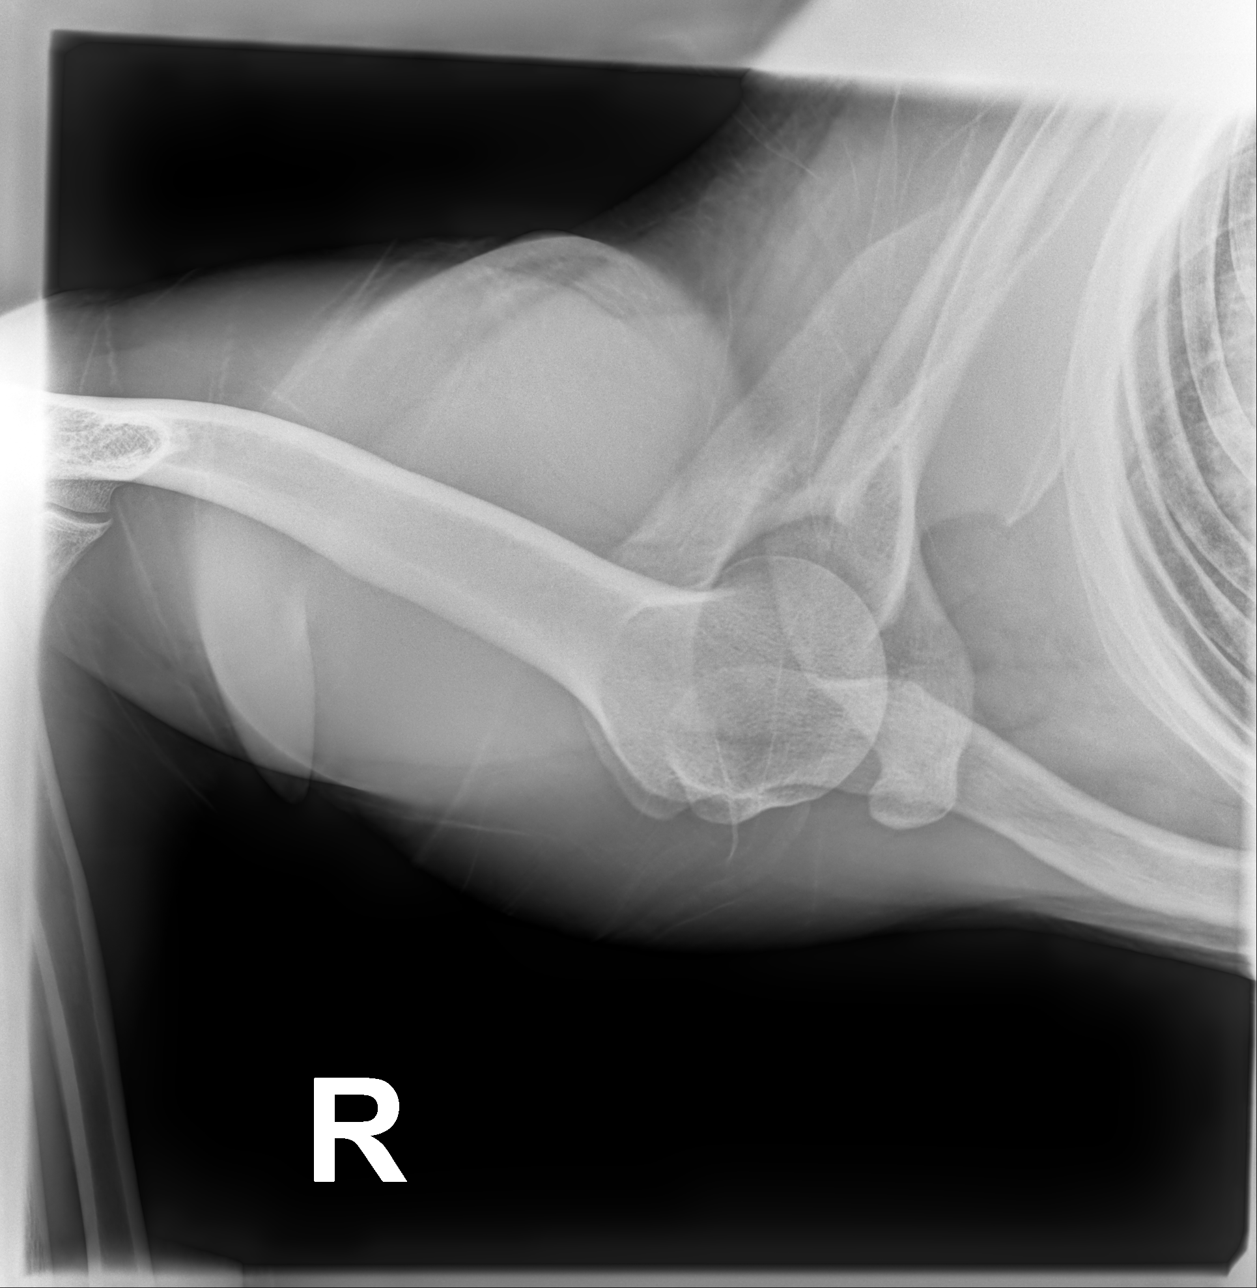

[3 of 3 positions shown; findings below may reference images not displayed]

FINDINGS: There is no evidence of fracture or dislocation. There is no
evidence of arthropathy or other focal bone abnormality. Soft
tissues are unremarkable.
IMPRESSION: Negative right shoulder radiographs.

## 2019-05-24 NOTE — Progress Notes (Signed)
I, Wendy Poet, LAT, ATC, am serving as scribe for Dr. Lynne Leader.  Jimmy [name] is a 29 y.o. male who presents to [place] [facility] today for f/u of neck and B UT/scapulae pain.  He was last seen by Dr. Georgina Snell on 04/26/19.  He is followed by Dr. Tomi Likens for migraines and takes nortriptyline and rizatriptan.  He notes weakness in his B hands and B shoulders and reports numbness/tingling in his B hands/fingers.  He has completed 5 PT sessions.  Since his last visit, pt reports that his pain remains unchanged.  He states that the dry needling does help some but this is temporary relief.  He states that he con't to have numbness/tingling in his B hands and reports popping in his B shoulders.  Right shoulder is worse than left.  He notes some pain in this as well.  Additionally he notes pain in the anterior right knee.  He keeps bumping his knee on objects at work and notes that he has little swelling and pain there as well.  Pertinent review of systems: No fevers or chills.  Relevant historical information: No fevers or chills.  History hernia history opiate dependency.   Exam:  BP (!) 140/92 (BP Location: Right Arm, Patient Position: Sitting, Cuff Size: Normal)   Pulse 88   Ht 5\' 2"  (1.575 m)   Wt 178 lb 6.4 oz (80.9 kg)   SpO2 97%   BMI 32.63 kg/m  General: Well Developed, well nourished, and in no acute distress.   MSK:  C-spine: Nontender to spinal midline normal cervical motion. Upper extremity strength is intact throughout. Reflexes are intact throughout bilateral extremities. Sensation and decreased right hand compared to left to light touch.  Right shoulder: Normal-appearing normal motion some popping with shoulder motion. Normal shoulder strength negative impingement testing.  Right hand and wrist normal-appearing Normal hand and wrist motion. Decreased sensation to light touch second and third digits. Positive Tinel's at carpal tunnel. Positive  Phalen's test.  Right knee: Normal-appearing  Normal motion. Minimally tender palpation overlying patellar tendon.  No significant squeak present. Intact strength. Stable ligamentous exam.    Lab and Radiology Results  X-ray images C-spine and right shoulder obtained today personally and independently reviewed.  Right shoulder: Mild degeneration in [facility] joint.  Otherwise shoulder exam normal.  C-spine: Loss of cervical lordosis.  No significant neuroforaminal stenosis no acute fractures.  Await formal radiology review    Assessment and Plan: 29 y.o. male with  Neck pain: Mild chronic neck pain ongoing.  Patient had significant benefit with physical therapy for his axillary neck pain but continues to have some symptoms down into his hands.  X-ray today shows evidence of spasm but not a lot of evidence of neuroforaminal stenosis.  I believe the answer to his pain in his arm is likely a nerve conduction study and not an MRI.  Hold off on MRI for now proceed with nerve conduction study.  Hand numbness: Likely carpal tunnel syndrome.  Cervical radiculopathy is a possibility but less likely.  X-ray was largely normal per my interpretation today.  Plan for EMG/nerve study and recheck.  Shoulder pain: Likely strain.  X-ray normal.  Recheck back for this issue in near future.  Knee pain: Prepatellar bursitis.  Compression and Voltaren gel.   PDMP not reviewed this encounter. Orders Placed This Encounter  Procedures  . DG Shoulder Right    Standing Status:   Future    Number  of Occurrences:   1    Standing Expiration Date:   07/23/2020    Order Specific Question:   Reason for Exam (SYMPTOM  OR DIAGNOSIS REQUIRED)    Answer:   eval shoulder pain r    Order Specific Question:   Preferred imaging location?    Answer:   [facility]    Order Specific Question:   Radiology Contrast Protocol - do NOT remove file path    Answer:    \\[email or web address].[email or web address]  . DG Cervical Spine Complete    Standing Status:   Future    Number of Occurrences:   1    Standing Expiration Date:   07/23/2020    Order Specific Question:   Reason for Exam (SYMPTOM  OR DIAGNOSIS REQUIRED)    Answer:   eval pain cspine and possible cervical rad    Order Specific Question:   Preferred imaging location?    Answer:   [facility]    Order Specific Question:   Radiology Contrast Protocol - do NOT remove file path    Answer:   \\[email or web address].pdf  . Ambulatory referral to Neurology    Referral Priority:   Routine    Referral Type:   Consultation    Referral Reason:   Specialty Services Required    Requested Specialty:   Neurology    Number of Visits Requested:   1  . NCV with EMG(electromyography)    Standing Status:   Future    Standing Expiration Date:   05/23/2020   No orders of the defined types were placed in this encounter.    Discussed warning signs or symptoms. Please see discharge instructions. Patient expresses understanding.   The above documentation has been reviewed and is accurate and complete Lynne Leader

## 2019-05-24 NOTE — Telephone Encounter (Signed)
X-ray cervical spine looks pretty normal to me today.  Radiologist will look it over as well but I think were probably not can to see much with an MRI.  I think the next step should be the nerve study and not an MRI.  Let me know what you think but I would like to cancel the MRI and proceed just with a nerve study for now.

## 2019-05-24 NOTE — Patient Instructions (Signed)
Thank you for coming in today. Plan for MRI cspine and nerve study.  Plan to recheck after MRI.  Use voltaren gel on the knee along with compression sleeve like Body Helix Full Knee Sleeve.  For for workup for pinched nerve in your neck and possibly wrist.

## 2019-05-25 ENCOUNTER — Encounter: Payer: 59 | Admitting: Physical Therapy

## 2019-05-25 NOTE — Progress Notes (Signed)
X-ray right shoulder is normal

## 2019-05-25 NOTE — Progress Notes (Signed)
X-ray cervical spine shows evidence of spasm.  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  No acute fractures or severe changes.

## 2019-05-25 NOTE — Telephone Encounter (Signed)
Called pt and relayed Dr. Clovis Riley message.  He states that he wants a c-spine MRI, a R shoulder MRI and a NCV/EMG.  Please advise.

## 2019-05-26 NOTE — Telephone Encounter (Signed)
Called pt to relay Dr. Clovis Riley message but could not LM as pt has a VM that has not been set up.

## 2019-05-26 NOTE — Telephone Encounter (Signed)
Cervical spine MRI ordered.  At this time it is going to be very hard to justify shoulder MRI.  Generally we need to try some conservative management for 6 weeks before insurance will pay for shoulder MRI.  We will get the MRI of the neck and the nerve conduction study and then recheck.

## 2019-05-27 ENCOUNTER — Encounter: Payer: 59 | Admitting: Physical Therapy

## 2019-05-30 ENCOUNTER — Other Ambulatory Visit: Payer: 59

## 2019-06-04 ENCOUNTER — Other Ambulatory Visit: Payer: Self-pay | Admitting: Family Medicine

## 2019-06-04 DIAGNOSIS — F3161 Bipolar disorder, current episode mixed, mild: Secondary | ICD-10-CM

## 2019-06-12 ENCOUNTER — Other Ambulatory Visit: Payer: 59

## 2019-06-15 ENCOUNTER — Telehealth: Payer: Self-pay | Admitting: Family Medicine

## 2019-06-15 NOTE — Telephone Encounter (Signed)
Changed location to [facility] no precert required for insurance

## 2019-06-15 NOTE — Telephone Encounter (Signed)
Attempted to call pt but does not have a VM that has been set up so unable to LM.  If pt returns call, please inform him that he can call [facility] at [phone] to schedule his c-spine MRI at his convenience.  Cannot guarantee that they will be able to get him in sooner than 06/28/19 which is when he is scheduled for the same MRI at [facility].  Also, if pt wants to proceed w/ his NCV/EMG test for his arms, he needs to reach back out to Dr. Georgie Chard office to get this scheduled.

## 2019-06-15 NOTE — Telephone Encounter (Signed)
Patient returned call. Informed of below.

## 2019-06-15 NOTE — Telephone Encounter (Signed)
Patient called asking if the MRI order could be sent somewhere else. The machine keeps going down every time they get him scheduled.

## 2019-06-20 ENCOUNTER — Other Ambulatory Visit: Payer: 59

## 2019-06-25 ENCOUNTER — Encounter: Payer: Self-pay | Admitting: Family Medicine

## 2019-06-25 ENCOUNTER — Other Ambulatory Visit: Payer: Self-pay

## 2019-06-25 ENCOUNTER — Ambulatory Visit (INDEPENDENT_AMBULATORY_CARE_PROVIDER_SITE_OTHER): Payer: 59 | Admitting: Family Medicine

## 2019-06-25 VITALS — BP 116/82 | HR 82 | Ht 62.0 in | Wt 176.0 lb

## 2019-06-25 DIAGNOSIS — R200 Anesthesia of skin: Secondary | ICD-10-CM

## 2019-06-25 DIAGNOSIS — M5412 Radiculopathy, cervical region: Secondary | ICD-10-CM | POA: Diagnosis not present

## 2019-06-25 MED ORDER — PREDNISONE 50 MG PO TABS: 50.0000 mg | ORAL_TABLET | Freq: Every day | ORAL | 0 refills | Status: DC

## 2019-06-25 MED ORDER — GABAPENTIN 300 MG PO CAPS: ORAL_CAPSULE | ORAL | 3 refills | Status: DC

## 2019-06-25 NOTE — Patient Instructions (Addendum)
Thank you for coming in today.  Take the prednisone for 5 days.  Take gabapentin as needed up to 3x daily for nerve pain.  Ok to increase the gabapentin faster than 1 x per week if you tolerate it.  It may make you drowsy.  Recheck with me after the MRI.  Recheck the week of the 26th.     Cervical Radiculopathy  Cervical radiculopathy means that a nerve in the neck (a cervical nerve) is pinched or bruised. This can happen because of an injury to the cervical spine (vertebrae) in the neck, or as a normal part of getting older. This can cause pain or loss of feeling (numbness) that runs from your neck all the way down to your arm and fingers. Often, this condition gets better with rest. Treatment may be needed if the condition does not get better. What are the causes?  A neck injury.  A bulging disk in your spine.  Muscle movements that you cannot control (muscle spasms).  Tight muscles in your neck due to overuse.  Arthritis.  Breakdown in the bones and joints of the spine (spondylosis) due to getting older.  Bone spurs that form near the nerves in the neck. What are the signs or symptoms?  Pain. The pain may: ? Run from the neck to the arm and hand. ? Be very bad or irritating. ? Be worse when you move your neck.  Loss of feeling or tingling in your arm or hand.  Weakness in your arm or hand, in very bad cases. How is this treated? In many cases, treatment is not needed for this condition. With rest, the condition often gets better over time. If treatment is needed, options may include:  Wearing a soft neck collar (cervical collar) for short periods of time, as told by your doctor.  Doing exercises (physical therapy) to strengthen your neck muscles.  Taking medicines.  Having shots (injections) in your spine, in very bad cases.  Having surgery. This may be needed if other treatments do not help. The type of surgery that is used depends on the cause of your  condition. Follow these instructions at home: If you have a soft neck collar:  Wear it as told by your doctor. Remove it only as told by your doctor.  Ask your doctor if you can remove the collar for cleaning and bathing. If you are allowed to remove the collar for cleaning or bathing: ? Follow instructions from your doctor about how to remove the collar safely. ? Clean the collar by wiping it with mild soap and water and drying it completely. ? Take out any removable pads in the collar every 1-2 days. Wash them by hand with soap and water. Let them air-dry completely before you put them back in the collar. ? Check your skin under the collar for redness or sores. If you see any, tell your doctor. Managing pain      Take over-the-counter and prescription medicines only as told by your doctor.  If told, put ice on the painful area. ? If you have a soft neck collar, remove it as told by your doctor. ? Put ice in a plastic bag. ? Place a towel between your skin and the bag. ? Leave the ice on for 20 minutes, 2-3 times a day.  If using ice does not help, you can try using heat. Use the heat source that your doctor recommends, such as a moist heat pack or a heating pad. ?  Place a towel between your skin and the heat source. ? Leave the heat on for 20-30 minutes. ? Remove the heat if your skin turns bright red. This is very important if you are unable to feel pain, heat, or cold. You may have a greater risk of getting burned.  You may try a gentle neck and shoulder rub (massage). Activity  Rest as needed.  Return to your normal activities as told by your doctor. Ask your doctor what activities are safe for you.  Do exercises as told by your doctor or physical therapist.  Do not lift anything that is heavier than 10 lb (4.5 kg) until your doctor tells you that it is safe. General instructions  Use a flat pillow when you sleep.  Do not drive while wearing a soft neck collar. If you  do not have a soft neck collar, ask your doctor if it is safe to drive while your neck heals.  Ask your doctor if the medicine prescribed to you requires you to avoid driving or using heavy machinery.  Do not use any products that contain nicotine or tobacco, such as cigarettes, e-cigarettes, and chewing tobacco. These can delay healing. If you need help quitting, ask your doctor.  Keep all follow-up visits as told by your doctor. This is important. Contact a doctor if:  Your condition does not get better with treatment. Get help right away if:  Your pain gets worse and is not helped with medicine.  You lose feeling or feel weak in your hand, arm, face, or leg.  You have a high fever.  You have a stiff neck.  You cannot control when you poop or pee (have incontinence).  You have trouble with walking, balance, or talking. Summary  Cervical radiculopathy means that a nerve in the neck is pinched or bruised.  A nerve can get pinched from a bulging disk, arthritis, an injury to the neck, or other causes.  Symptoms include pain, tingling, or loss of feeling that goes from the neck into the arm or hand.  Weakness in your arm or hand can happen in very bad cases.  Treatment may include resting, wearing a soft neck collar, and doing exercises. You might need to take medicines for pain. In very bad cases, shots or surgery may be needed. This information is not intended to replace advice given to you by your health care provider. Make sure you discuss any questions you have with your health care provider. Document Revised: 01/23/2018 Document Reviewed: 01/23/2018 Elsevier Patient Education  2020 [employer].

## 2019-06-25 NOTE — Progress Notes (Signed)
Jimmy [name], am serving as a scribe for Dr. Hulan Saas. This visit occurred during the SARS-CoV-2 public health emergency.  Safety protocols were in place, including screening questions prior to the visit, additional usage of staff PPE, and extensive cleaning of exam room while observing appropriate contact time as indicated for disinfecting solutions.   05/24/2019 Neck pain: Mild chronic neck pain ongoing.  Patient had significant benefit with physical therapy for his axillary neck pain but continues to have some symptoms down into his hands.  X-ray today shows evidence of spasm but not a lot of evidence of neuroforaminal stenosis.  I believe the answer to his pain in his arm is likely a nerve conduction study and not an MRI.  Hold off on MRI for now proceed with nerve conduction study.Neck pain: Mild chronic neck pain ongoing.  Patient had significant benefit with physical therapy for his axillary neck pain but continues to have some symptoms down into his hands.  X-ray today shows evidence of spasm but not a lot of evidence of neuroforaminal stenosis.  I believe the answer to his pain in his arm is likely a nerve conduction study and not an MRI.  Hold off on MRI for now proceed with nerve conduction study.  Hand numbness: Likely carpal tunnel syndrome.  Cervical radiculopathy is a possibility but less likely.  X-ray was largely normal per my interpretation today.  Plan for EMG/nerve study and recheck.  Shoulder pain: Likely strain.  X-ray normal.  Recheck back for this issue in near future.  Knee pain: Prepatellar bursitis.  Compression and Voltaren gel.  Update 06/25/2019 Jimmy [name] is a 29 y.o. male who presents to [place] [facility] today for neck, knee, hand and shoulder pain. Patient states that he continues to have pain in neck and shoulder pain. Tingling in thumb, 2nd and 3rd fingers with driving. Patient states that his pain got so bad last week that  his only relief was holding it across his body in a "sling" position. Left shoulder continues to pop with use. States that he has had some abrasions on his hands and even hit 2nd finger with a sledgehammer but did not feel pain until he saw the bleeding.  Knee pain has been improving since last visit.   At the last visit patient was thought to have either cervical radiculopathy or peripheral neuropathy or some commendation of both.  EMG/nerve conduction study was ordered as well as a cervical MRI.  MRI is ordered and pending for later this month.  The patient notes that he was never called for his nerve conduction study.  Upon review of the notes from neurology office it looks like it was sent to provider review but somewhat lost to follow-up.    Pertinent review of systems: Fevers or chills  Relevant historical information: History opiate dependency in the past   Exam:  BP 116/82   Pulse 82   Ht 5\' 2"  (1.575 m)   Wt 176 lb (79.8 kg)   SpO2 97%   BMI 32.19 kg/m  General: Well Developed, well nourished, and in [name] acute distress.   MSK:  C-spine: Normal-appearing nontender normal cervical motion. Positive right-sided Spurling's test.   Right shoulder normal-appearing nontender normal shoulder motion with some pain with abduction. Normal shoulder strength.  Negative impingement testing.  Pulses and cap refill are intact distal bilateral extremities.  Sensation decreased right hand especially digits 2 and 3.    Lab and Radiology  Results DG Cervical Spine Complete  Result Date: 05/25/2019 CLINICAL DATA:  Chronic neck pain. [name] reported injury. EXAM: CERVICAL SPINE - COMPLETE 4+ VIEW COMPARISON:  05/26/2013 cervical spine radiographs FINDINGS: On the lateral view the cervical spine is visualized to the level of C7-T1. Straightening of the cervical spine. Pre-vertebral soft tissues are within normal limits. [name] fracture is detected in the cervical spine. Dens is well positioned between  the lateral masses of C1. Cervical disc heights are preserved, with [name] appreciable spondylosis. [name] cervical spine subluxation. Mild facet arthropathy in the right with mild degenerative foraminal stenosis at C3-4 and C4-5. [name] left-sided degenerative foraminal stenosis. [name] aggressive-appearing focal osseous lesions. IMPRESSION: 1. Straightening of the cervical spine, usually due to positioning and/or muscle spasm. 2. Mild right facet arthropathy with mild degenerative foraminal stenosis at C3-4 and C4-5. Electronically Signed   By: Ilona Sorrel M.D.   On: 05/25/2019 09:13   DG Shoulder Right  Result Date: 05/25/2019 CLINICAL DATA:  Right shoulder pain. EXAM: RIGHT SHOULDER - 2+ VIEW COMPARISON:  None. FINDINGS: There is [name] evidence of fracture or dislocation. There is [name] evidence of arthropathy or other focal bone abnormality. Soft tissues are unremarkable. IMPRESSION: Negative right shoulder radiographs. Electronically Signed   By: San Morelle M.D.   On: 05/25/2019 08:51   I, Lynne Leader, personally (independently) visualized and performed the interpretation of the images attached in this note.     Assessment and Plan: 30 y.o. male with  Right shoulder and arm pain.  Likely multifactorial difficult to fully tell.  Majority of pain I believe is cervical radicular likely coming from C7 nerve root.  Fortunately he already has an MRI ordered and will be done later this month.  Plan to treat symptomatically now with gabapentin and prednisone.     Additionally I ordered an EMG/nerve conduction study at the last visit.  This has not been scheduled yet.  Sent a message to neurology department to inquire about scheduling EMG.   PDMP not reviewed this encounter. [name] orders of the defined types were placed in this encounter.  Meds ordered this encounter  Medications  . gabapentin (NEURONTIN) 300 MG capsule    Sig: One tab PO qHS for a week, then BID for a week, then TID. May double weekly to  a max of 3,600mg /day    Dispense:  180 capsule    Refill:  3  . predniSONE (DELTASONE) 50 MG tablet    Sig: Take 1 tablet (50 mg total) by mouth daily.    Dispense:  5 tablet    Refill:  0     Discussed warning signs or symptoms. Please see discharge instructions. Patient expresses understanding.   The above documentation has been reviewed and is accurate and complete Lynne Leader

## 2019-06-28 ENCOUNTER — Other Ambulatory Visit: Payer: 59

## 2019-07-01 ENCOUNTER — Telehealth: Payer: Self-pay | Admitting: *Deleted

## 2019-07-01 MED ORDER — PREGABALIN 75 MG PO CAPS: 75.0000 mg | ORAL_CAPSULE | Freq: Two times a day (BID) | ORAL | 1 refills | Status: DC

## 2019-07-01 NOTE — Telephone Encounter (Signed)
Called pt and he has not started the Gabapentin because it required a PA.  I advise pt that he can go onto [email or web address] and get a discount that way and the cost will be around $17.50.  Pt verbalizes understanding.  I advise him to take that medication for 4-5 days and see how he feels.  If no better, advised pt to call us back.

## 2019-07-01 NOTE — Telephone Encounter (Signed)
Pt called stating he is still having pain & neither the predisone or gabapentin has given him any relief. Pt is scheduled 07/10/19 for his cervical MRI. Pt would like to know if there is anything else that Dr. Georgina Snell recommends between now and his MRI.

## 2019-07-01 NOTE — Telephone Encounter (Signed)
We will stop gabapentin.  Lyrica prescribed which may be a bit more effective.  Medication sent to [facility].  Please contact patient and let him know that if the medication is not covered by his insurance he can use [email or web address] to get the prescription for about $22 at [facility].

## 2019-07-06 ENCOUNTER — Other Ambulatory Visit: Payer: Self-pay | Admitting: Family Medicine

## 2019-07-06 DIAGNOSIS — F3161 Bipolar disorder, current episode mixed, mild: Secondary | ICD-10-CM

## 2019-07-07 MED ORDER — QUETIAPINE FUMARATE 100 MG PO TABS: 100.0000 mg | ORAL_TABLET | Freq: Every day | ORAL | 0 refills | Status: DC

## 2019-07-08 ENCOUNTER — Encounter: Payer: Self-pay | Admitting: Physical Therapy

## 2019-07-10 ENCOUNTER — Other Ambulatory Visit: Payer: Self-pay

## 2019-07-10 ENCOUNTER — Ambulatory Visit
Admission: RE | Admit: 2019-07-10 | Discharge: 2019-07-10 | Disposition: A | Discharge status: Home / Self Care | Payer: 59 | Source: Ambulatory Visit | Attending: Family Medicine | Admitting: Family Medicine

## 2019-07-10 DIAGNOSIS — M542 Cervicalgia: Secondary | ICD-10-CM

## 2019-07-10 DIAGNOSIS — M5412 Radiculopathy, cervical region: Secondary | ICD-10-CM

## 2019-07-10 IMAGING — MR MR CERVICAL SPINE W/O CM
5 series · 29 of 48 positions shown · non-contrast
Comparison: Prior radiograph from [DATE].

CLINICAL DATA: Initial evaluation for chronic neck pain with right
upper extremity numbness, tingling, and weakness.

EXAM:
MRI CERVICAL SPINE WITHOUT CONTRAST
TECHNIQUE: Multiplanar, multisequence MR imaging of the cervical spine was
performed. No intravenous contrast was administered.

[Series 2: T2 · sagittal · 3.0mm · 0.41mm/px · 6 of 13 slices shown (1 of 2)]
[im 1/13]
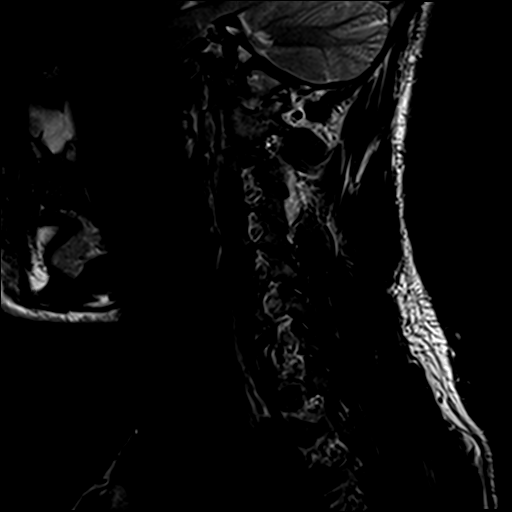
[im 3/13]
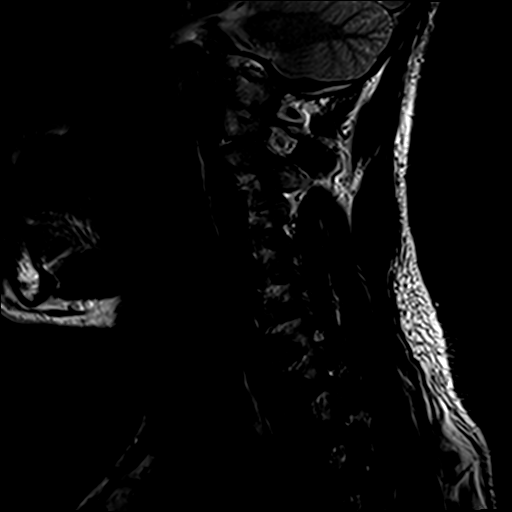
[im 5/13]
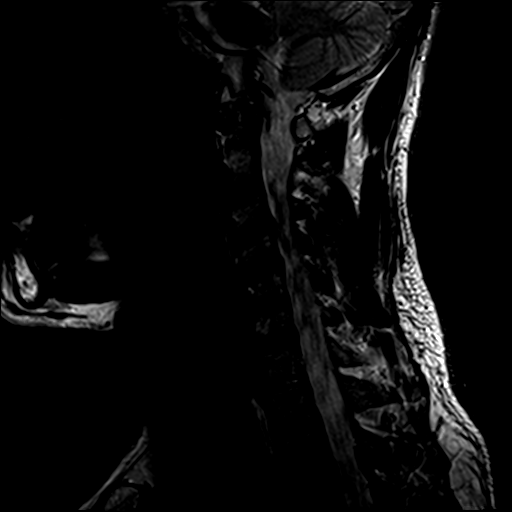
[im 8/13]
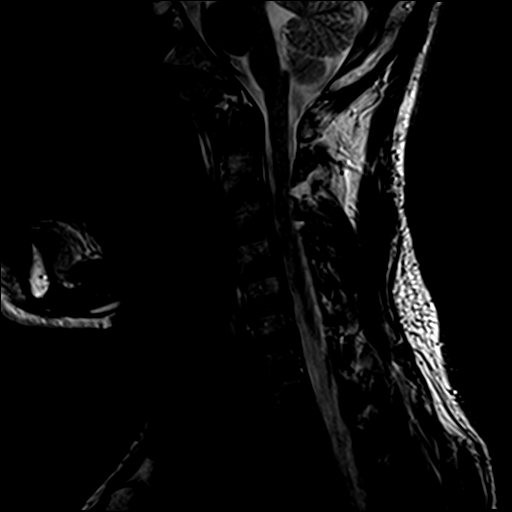
[im 10/13]
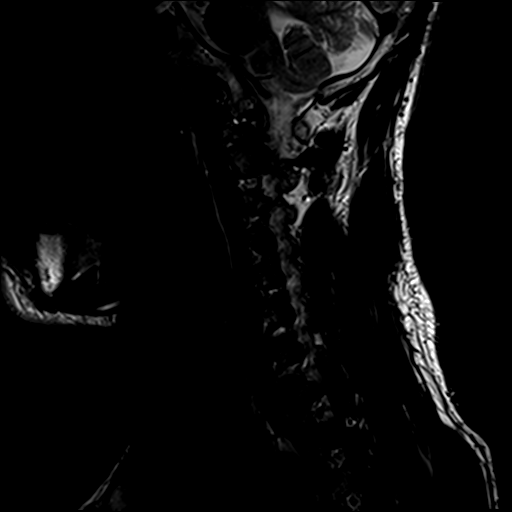
[im 13/13]
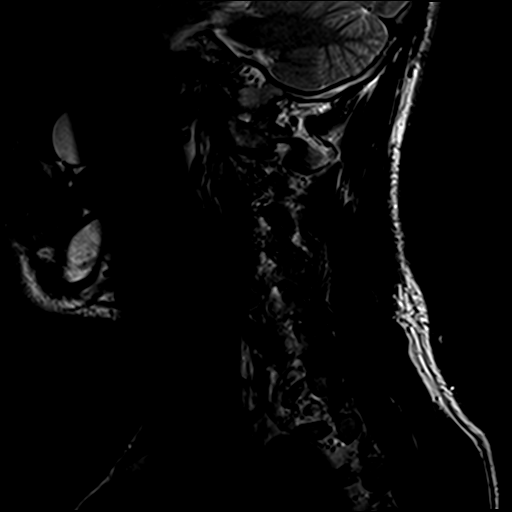

[Series 3: T1 · sagittal · 3.0mm · 0.41mm/px · 7 of 13 slices shown]
[im 1/13]
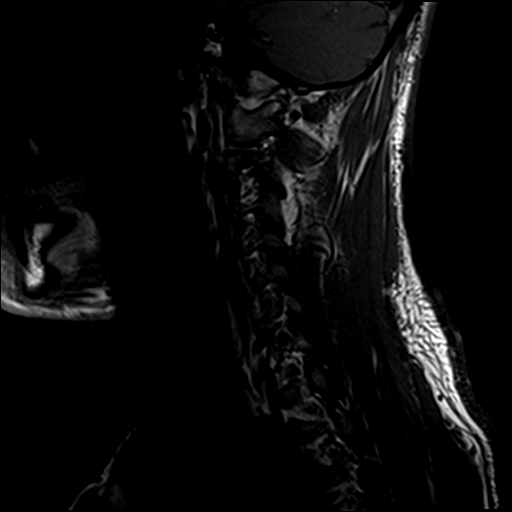
[im 3/13]
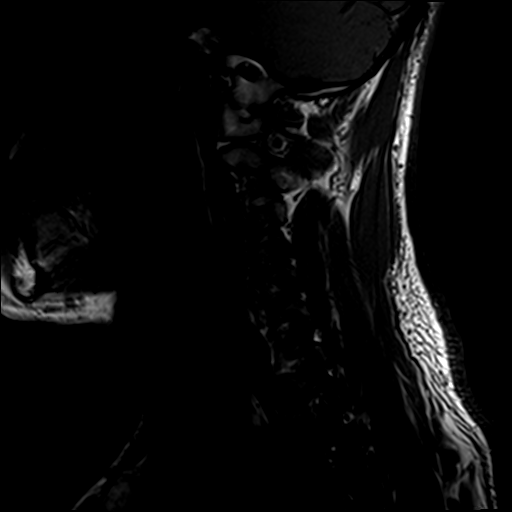
[im 5/13]
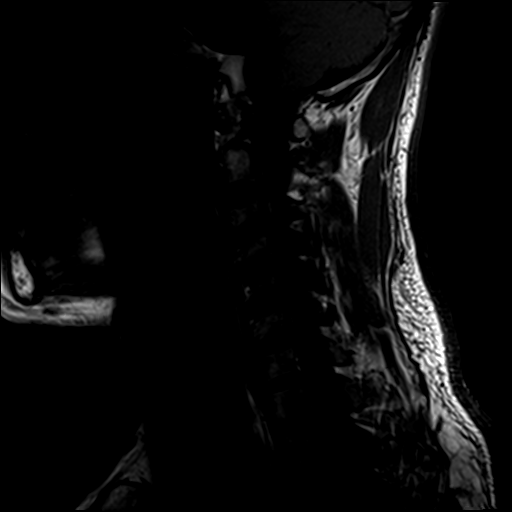
[im 7/13]
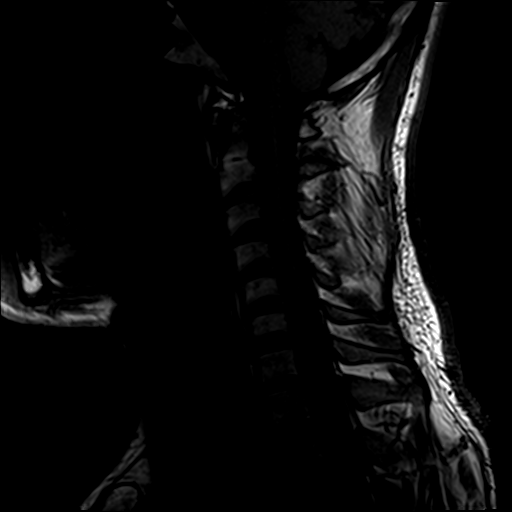
[im 9/13]
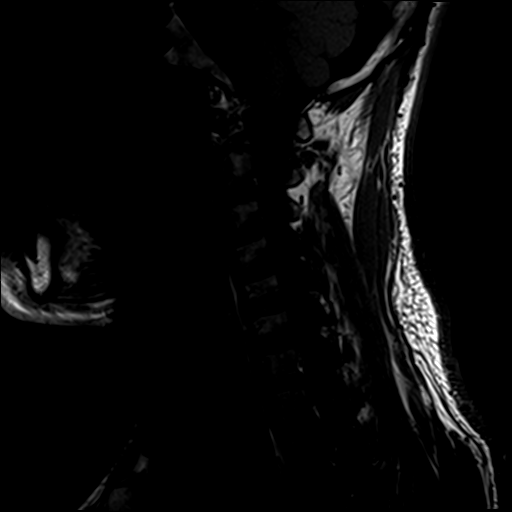
[im 11/13]
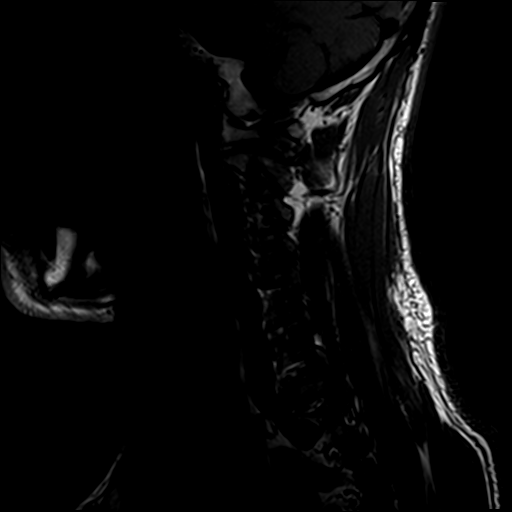
[im 13/13]
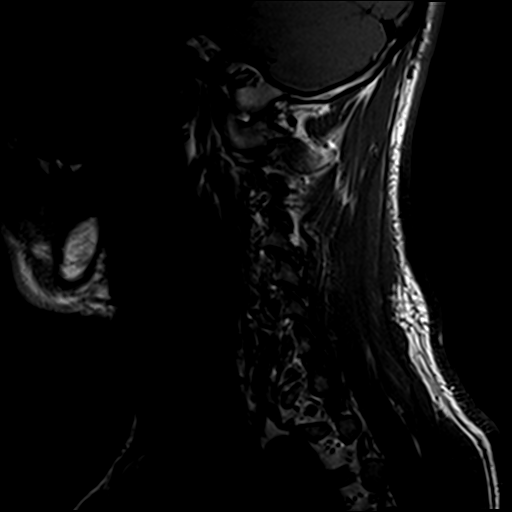

[Series 5: GRE · axial · 3.0mm · 0.35mm/px · 1 of 26 slices shown]
[im 1/26]
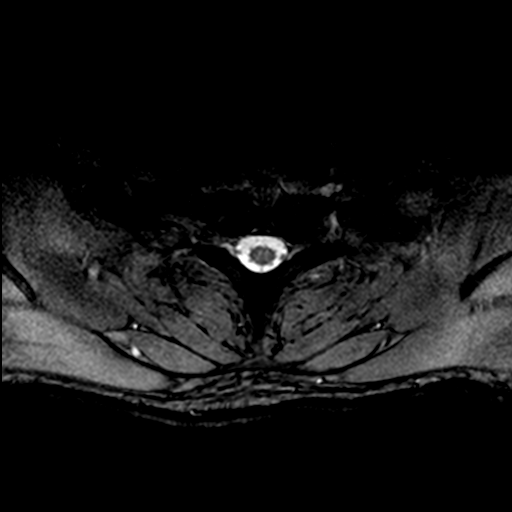

[Series 6: T2 · axial · 3.0mm · 0.70mm/px · z∈[-80,+14]mm · 8 of 26 slices shown (2 of 2)]
[im 1/26]
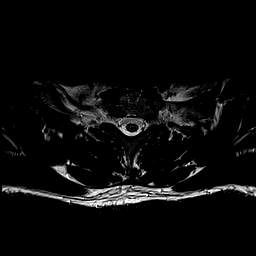
[im 4/26]
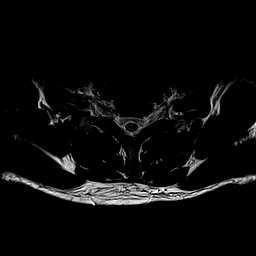
[im 8/26]
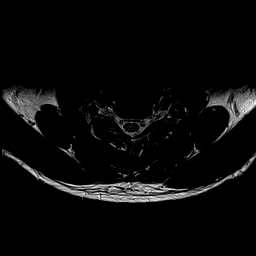
[im 12/26]
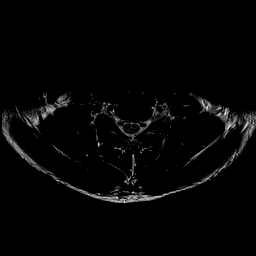
[im 14/26]
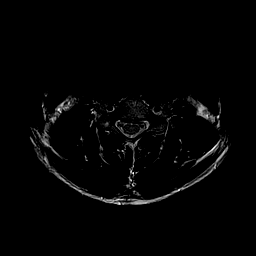
[im 18/26]
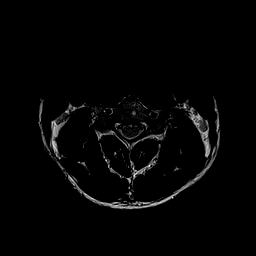
[im 22/26]
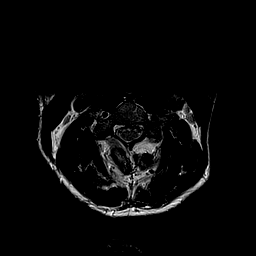
[im 26/26]
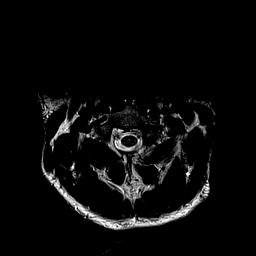

[Series 7: STIR · sagittal · 3.0mm · 0.82mm/px · 7 of 13 slices shown]
[im 1/13]
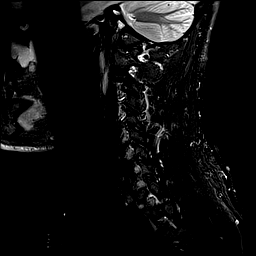
[im 3/13]
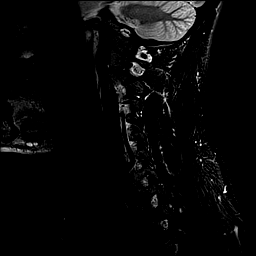
[im 5/13]
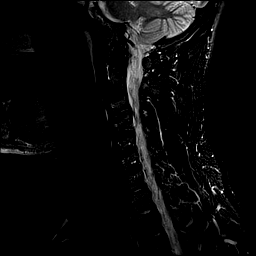
[im 7/13]
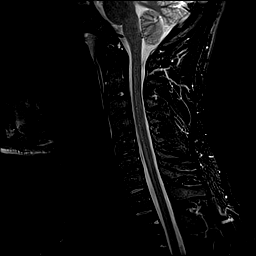
[im 9/13]
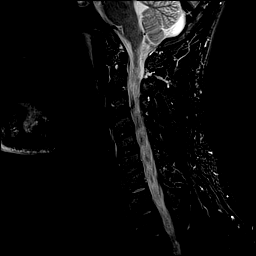
[im 11/13]
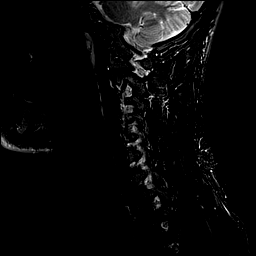
[im 13/13]
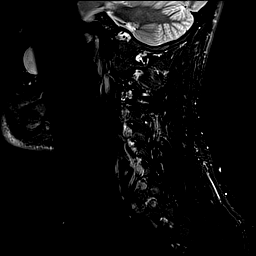

[29 of 48 positions shown; findings below may reference images not displayed]

FINDINGS: Alignment: Straightening of the normal cervical lordosis. No
listhesis or subluxation.

Vertebrae: Vertebral body height maintained without evidence for
acute or chronic fracture. Bone marrow signal intensity within
normal limits. No discrete or worrisome osseous lesions. No abnormal
marrow edema.

Cord: Signal intensity within the cervical spinal cord is normal.
Normal cord caliber morphology.

Posterior Fossa, vertebral arteries, paraspinal tissues: Visualized
brain and posterior fossa within normal limits. Craniocervical
junction normal. Paraspinous and prevertebral soft tissues within
normal limits. Normal intravascular flow voids seen within the
vertebral arteries bilaterally.

Disc levels:

C2-C3: Mild uncovertebral hypertrophy without significant disc
bulge. Right-sided facet degeneration. Resultant mild right C3
foraminal stenosis. No significant canal or left foraminal
narrowing.

C3-C4: Mild right eccentric disc bulge with right-sided
uncovertebral hypertrophy. Flattening and partial effacement of the
ventral thecal sac on the right with resultant mild spinal stenosis.
No cord deformity. Mild right C4 foraminal stenosis. Left neural
foramina widely patent.

C4-C5: Minimal disc bulge with uncovertebral hypertrophy. No
significant spinal stenosis. Mild right C5 foraminal narrowing. No
significant left foraminal encroachment.

C5-C6: Minimal disc bulge with uncovertebral hypertrophy. No
significant canal or foraminal stenosis.

C6-C7:  Minimal annular disc bulge. No canal or foraminal stenosis.

C7-T1: Normal interspace. Minimal facet hypertrophy. No canal or
foraminal stenosis.

Visualized upper thoracic spine demonstrates no significant finding.
IMPRESSION: 1. Right eccentric disc bulge with uncovertebral hypertrophy at C3-4
with resultant mild canal and right C4 foraminal stenosis.
2. Mild right C3 and C5 foraminal stenosis related to disc bulge and
uncovertebral hypertrophy. No other significant canal or foraminal
stenosis within the cervical spine.

## 2019-07-12 ENCOUNTER — Other Ambulatory Visit: Payer: Self-pay

## 2019-07-12 ENCOUNTER — Encounter: Payer: Self-pay | Admitting: Family Medicine

## 2019-07-12 ENCOUNTER — Ambulatory Visit (INDEPENDENT_AMBULATORY_CARE_PROVIDER_SITE_OTHER): Payer: 59

## 2019-07-12 ENCOUNTER — Ambulatory Visit (INDEPENDENT_AMBULATORY_CARE_PROVIDER_SITE_OTHER): Payer: 59 | Admitting: Family Medicine

## 2019-07-12 VITALS — BP 120/82 | HR 79 | Ht 62.0 in | Wt 166.6 lb

## 2019-07-12 DIAGNOSIS — M5412 Radiculopathy, cervical region: Secondary | ICD-10-CM

## 2019-07-12 DIAGNOSIS — S62639A Displaced fracture of distal phalanx of unspecified finger, initial encounter for closed fracture: Secondary | ICD-10-CM

## 2019-07-12 IMAGING — DX DG FINGER INDEX 2+V*R*
3 series · 3 of 3 positions shown · non-contrast
Comparison: [DATE]

CLINICAL DATA: Second distal phalangeal tuft fracture

EXAM:
RIGHT INDEX FINGER 2+V

[finger pa]
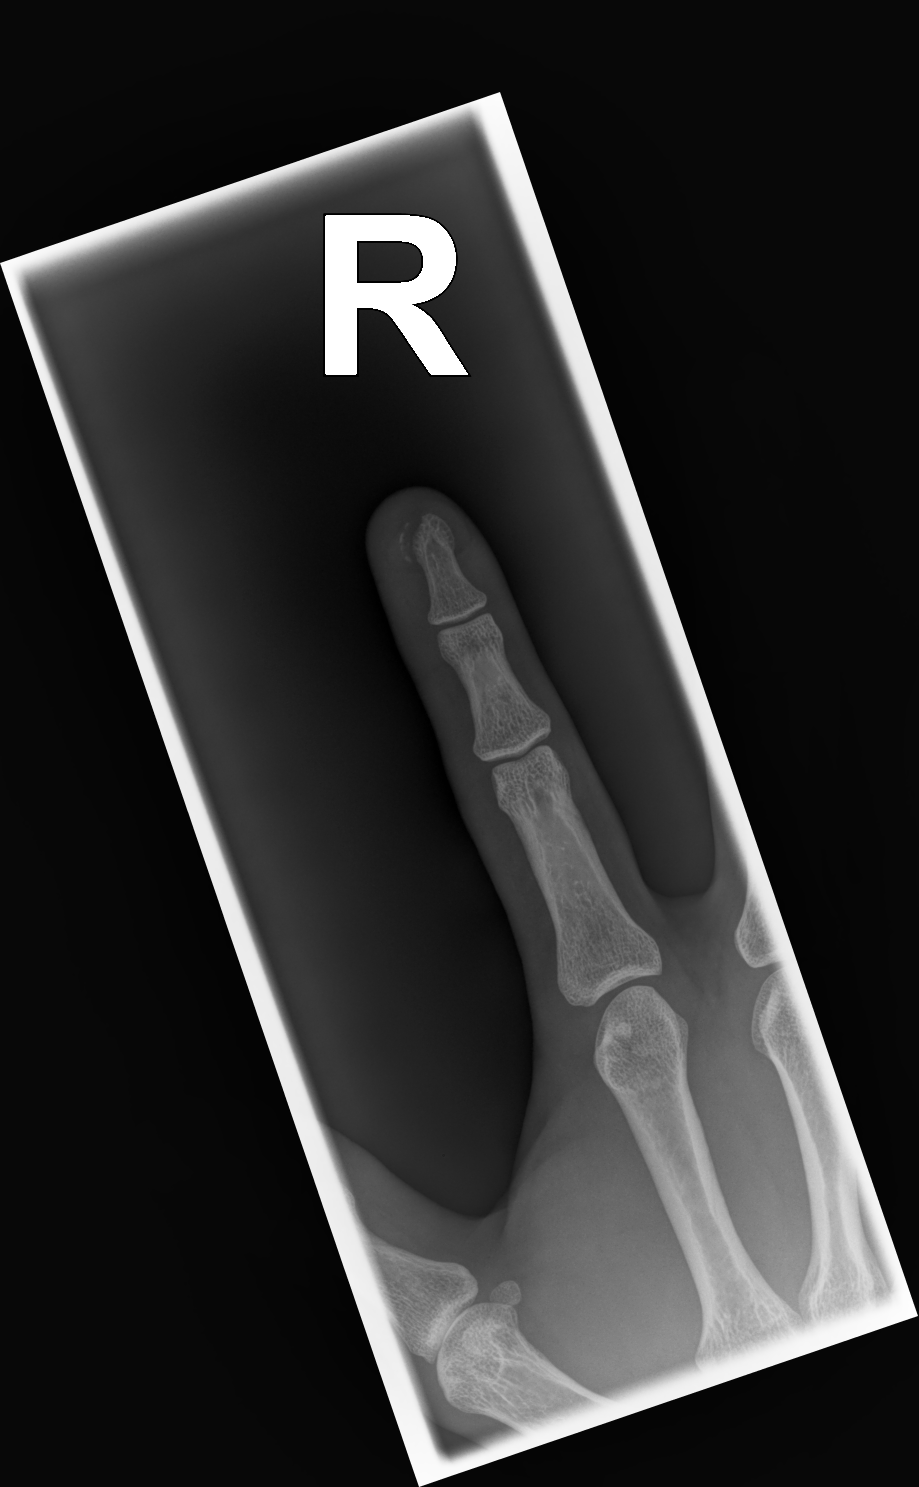

[finger mlo]
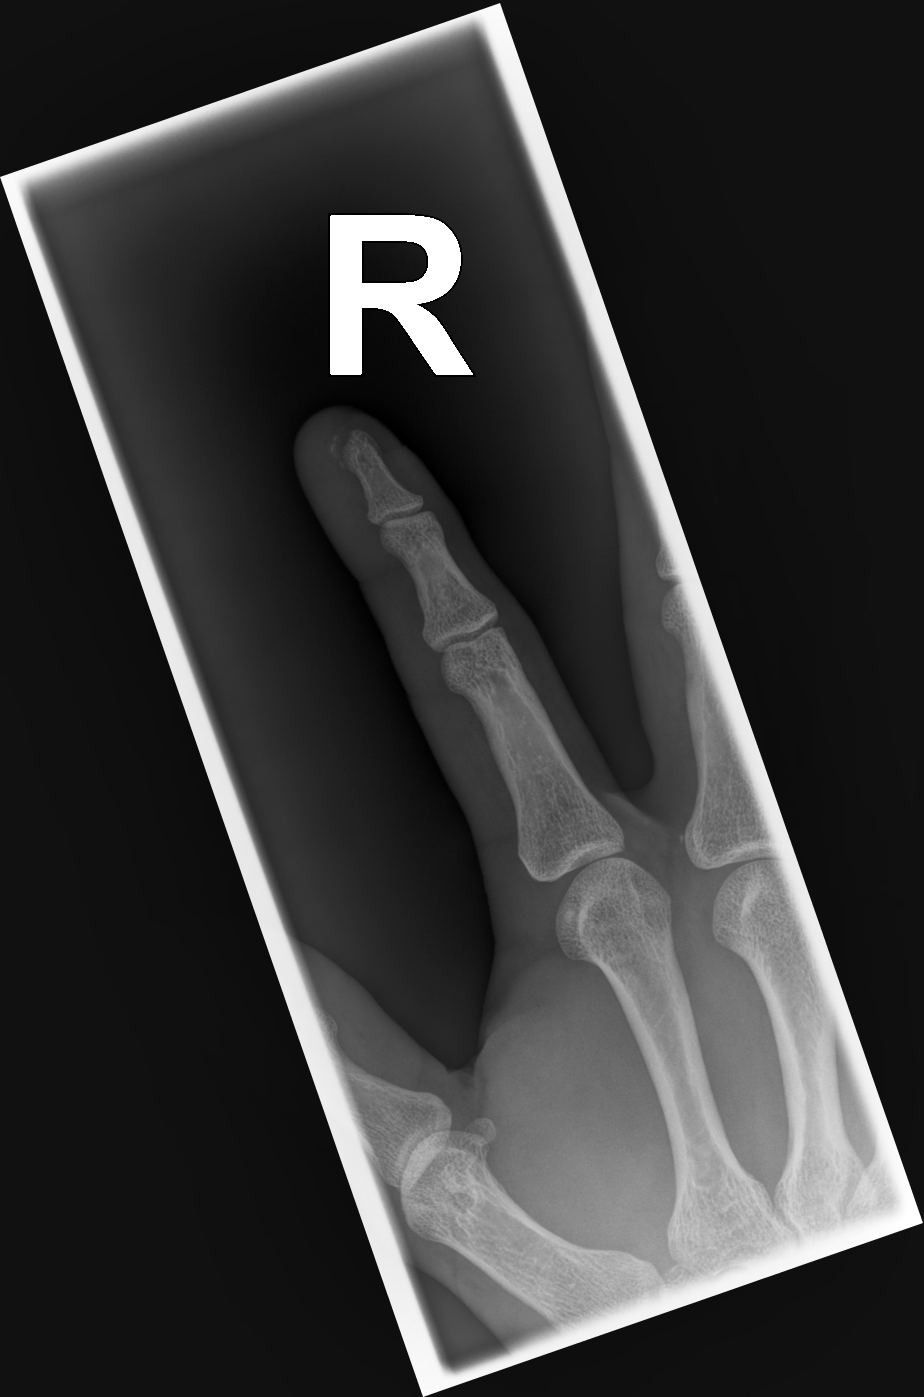

[2. finger lat]
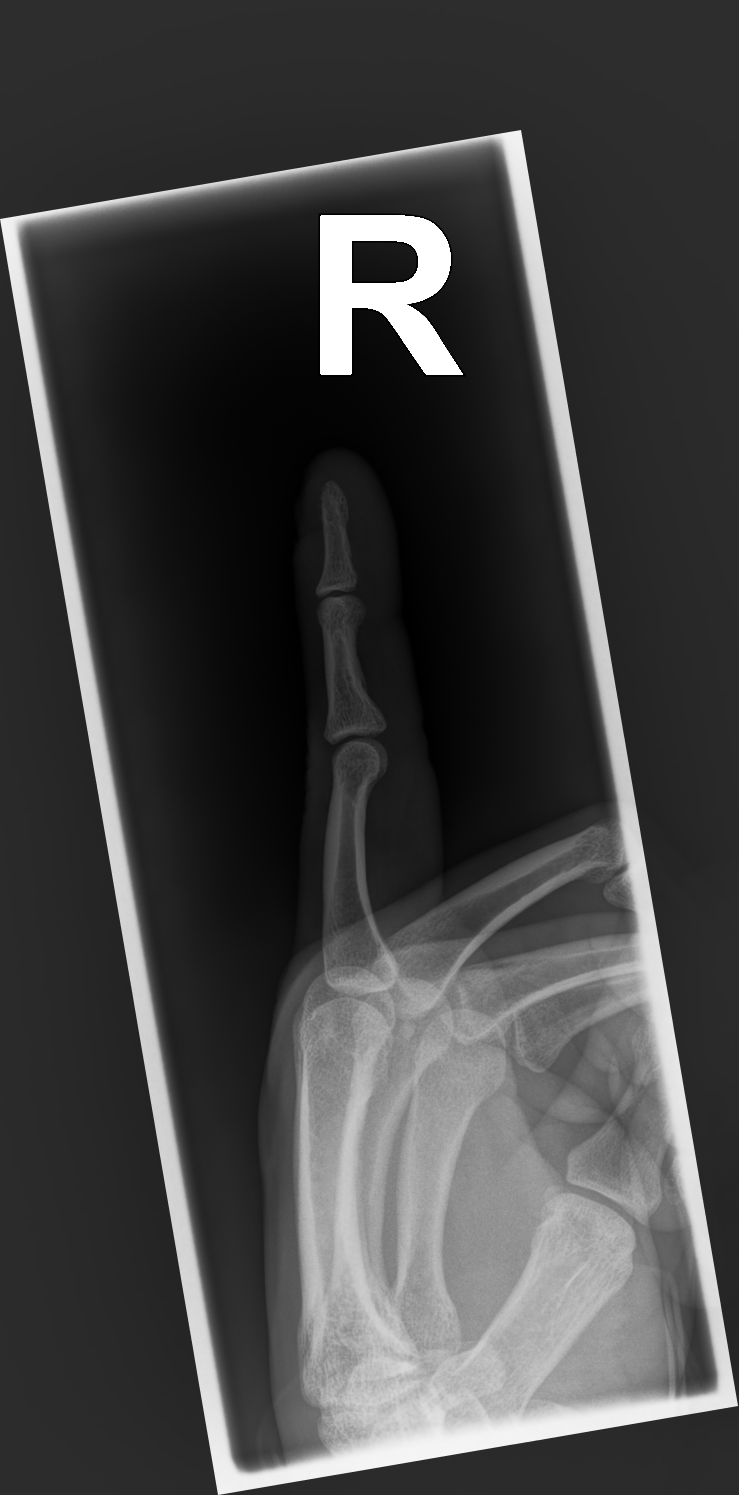

[3 of 3 positions shown; findings below may reference images not displayed]

FINDINGS: Frontal, oblique, lateral views of the right second digit
demonstrate displaced fracture of the distal tuft of the second
distal phalanx unchanged. Overlying soft tissue swelling. Joint
spaces are well preserved.
IMPRESSION: 1. No change in the displaced second distal phalangeal tuft fracture
seen previously.

## 2019-07-12 NOTE — Progress Notes (Signed)
Fracture looks pretty stable.  I think it skin to be able to heal up nicely.  Use the STAX splint like we talked about.

## 2019-07-12 NOTE — Progress Notes (Signed)
I, Wendy Poet, LAT, ATC, am serving as scribe for Dr. Lynne Leader.  Jimmy [name] is a 29 y.o. male who presents to [place] at [facility] today for f/u of neck pain and B shoulder/arm pain w/ numbness/tingling in his B hands.  Pt was last seen on 06/25/19 and was prescribed prednisone and gabapentin and referred for a c-spine MRI.  He was also referred for an UE NCV/EMG but has not completed this.  Since his last visit, pt reports a new injury when he was using a sledgehammer and it hit/smashed the tip of his R index finger.  He states that he went to the ED in [place] and had XRs and per pt reports his distal index finger is "shattered."  He has not f/u w/ anyone following this injury.  He states his R index finger is constantly throbbing since his injury.  He con't to have neck and B shoulder pain, R>L.    Diagnostic testing: C-spine and R shoulder XR- 05/24/19; C-spine MRI- 07/10/19   Pertinent review of systems: No fevers or chills  Relevant historical information: History opiate dependency   Exam:  BP 120/82 (BP Location: Right Arm, Patient Position: Sitting, Cuff Size: Normal)   Pulse 79   Ht 5\' 2"  (1.575 m)   Wt 166 lb 9.6 oz (75.6 kg)   SpO2 98%   BMI 30.47 kg/m  General: Well Developed, well nourished, and in no acute distress.   MSK: Right index finger subungual hematoma and surrounding bruising.  Tender palpation.  Normal finger motion and strength. Sensation is intact distally.  C-spine: Normal-appearing nontender normal motion.  Upper extremity strength is intact  Lab and Radiology Results DG Cervical Spine Complete  Result Date: 05/25/2019 CLINICAL DATA:  Chronic neck pain. No reported injury. EXAM: CERVICAL SPINE - COMPLETE 4+ VIEW COMPARISON:  05/26/2013 cervical spine radiographs FINDINGS: On the lateral view the cervical spine is visualized to the level of C7-T1. Straightening of the cervical spine. Pre-vertebral soft tissues are within normal  limits. No fracture is detected in the cervical spine. Dens is well positioned between the lateral masses of C1. Cervical disc heights are preserved, with no appreciable spondylosis. No cervical spine subluxation. Mild facet arthropathy in the right with mild degenerative foraminal stenosis at C3-4 and C4-5. No left-sided degenerative foraminal stenosis. No aggressive-appearing focal osseous lesions. IMPRESSION: 1. Straightening of the cervical spine, usually due to positioning and/or muscle spasm. 2. Mild right facet arthropathy with mild degenerative foraminal stenosis at C3-4 and C4-5. Electronically Signed   By: Ilona Sorrel M.D.   On: 05/25/2019 09:13   DG Shoulder Right  Result Date: 05/25/2019 CLINICAL DATA:  Right shoulder pain. EXAM: RIGHT SHOULDER - 2+ VIEW COMPARISON:  None. FINDINGS: There is no evidence of fracture or dislocation. There is no evidence of arthropathy or other focal bone abnormality. Soft tissues are unremarkable. IMPRESSION: Negative right shoulder radiographs. Electronically Signed   By: San Morelle M.D.   On: 05/25/2019 08:51   MR CERVICAL SPINE WO CONTRAST  Result Date: 07/10/2019 CLINICAL DATA:  Initial evaluation for chronic neck pain with right upper extremity numbness, tingling, and weakness. EXAM: MRI CERVICAL SPINE WITHOUT CONTRAST TECHNIQUE: Multiplanar, multisequence MR imaging of the cervical spine was performed. No intravenous contrast was administered. COMPARISON:  Prior radiograph from 05/24/2019. FINDINGS: Alignment: Straightening of the normal cervical lordosis. No listhesis or subluxation. Vertebrae: Vertebral body height maintained without evidence for acute or chronic fracture. Bone marrow signal intensity  within normal limits. No discrete or worrisome osseous lesions. No abnormal marrow edema. Cord: Signal intensity within the cervical spinal cord is normal. Normal cord caliber morphology. Posterior Fossa, vertebral arteries, paraspinal tissues:  Visualized brain and posterior fossa within normal limits. Craniocervical junction normal. Paraspinous and prevertebral soft tissues within normal limits. Normal intravascular flow voids seen within the vertebral arteries bilaterally. Disc levels: C2-C3: Mild uncovertebral hypertrophy without significant disc bulge. Right-sided facet degeneration. Resultant mild right C3 foraminal stenosis. No significant canal or left foraminal narrowing. C3-C4: Mild right eccentric disc bulge with right-sided uncovertebral hypertrophy. Flattening and partial effacement of the ventral thecal sac on the right with resultant mild spinal stenosis. No cord deformity. Mild right C4 foraminal stenosis. Left neural foramina widely patent. C4-C5: Minimal disc bulge with uncovertebral hypertrophy. No significant spinal stenosis. Mild right C5 foraminal narrowing. No significant left foraminal encroachment. C5-C6: Minimal disc bulge with uncovertebral hypertrophy. No significant canal or foraminal stenosis. C6-C7:  Minimal annular disc bulge. No canal or foraminal stenosis. C7-T1: Normal interspace. Minimal facet hypertrophy. No canal or foraminal stenosis. Visualized upper thoracic spine demonstrates no significant finding. IMPRESSION: 1. Right eccentric disc bulge with uncovertebral hypertrophy at C3-4 with resultant mild canal and right C4 foraminal stenosis. 2. Mild right C3 and C5 foraminal stenosis related to disc bulge and uncovertebral hypertrophy. No other significant canal or foraminal stenosis within the cervical spine. Electronically Signed   By: Jeannine Boga M.D.   On: 07/10/2019 22:48   DG Finger Index Right  Result Date: 07/12/2019 CLINICAL DATA:  Second distal phalangeal tuft fracture EXAM: RIGHT INDEX FINGER 2+V COMPARISON:  07/05/2019 FINDINGS: Frontal, oblique, lateral views of the right second digit demonstrate displaced fracture of the distal tuft of the second distal phalanx unchanged. Overlying soft tissue  swelling. Joint spaces are well preserved. IMPRESSION: 1. No change in the displaced second distal phalangeal tuft fracture seen previously. Electronically Signed   By: Randa Ngo M.D.   On: 07/12/2019 10:30     I, Lynne Leader, personally (independently) visualized and performed the interpretation of the images attached in this note.     Assessment and Plan: 29 y.o. male with  Right index finger fracture.  Seems to be doing well.  Plan to transition to Stax splint recheck in 1 to 2 weeks.  Distal arm symptoms.  Somewhat unclear etiology.  Recent MRI does show some changes in C4 and C5 but not much lower.  These may correspond to the symptoms that he is having into his hands and arms but does not fit completely.  Reasonable to see with trial of cervical epidural steroid injection.  However also will proceed with nephew DEXA study.  This was ordered previously but not able to be scheduled.  We will try different office.  Provided patient with phone number.   PDMP not reviewed this encounter. Orders Placed This Encounter  Procedures  . DG Finger Index Right    Standing Status:   Future    Number of Occurrences:   1    Standing Expiration Date:   09/10/2020    Order Specific Question:   Reason for Exam (SYMPTOM  OR DIAGNOSIS REQUIRED)    Answer:   f/u tuft fx from ED in [facility]    Order Specific Question:   Preferred imaging location?    Answer:   [facility]    Order Specific Question:   Radiology Contrast Protocol - do NOT remove file path    Answer:   \\  [email or web address].[email or web address]  . DG INJECT DIAG/THERA/INC NEEDLE/CATH/PLC EPI/LUMB/SAC W/IMG    CERV EPI #1 C4 AETNA 166 LBS PACS (07/10/19) NO THINS/OTC*SCREENED*    Standing Status:   Future    Standing Expiration Date:   09/10/2020    Order Specific Question:   Reason for Exam (SYMPTOM  OR DIAGNOSIS REQUIRED)    Answer:   As high as safe. Technique per radiology. C4 radic symptoms and possible C7  radic symptoms    Order Specific Question:   Preferred Imaging Location?    Answer:   [id number]    Order Specific Question:   Radiology Contrast Protocol - do NOT remove file path    Answer:   \\[email or web address]   No orders of the defined types were placed in this encounter.    Discussed warning signs or symptoms. Please see discharge instructions. Patient expresses understanding.   The above documentation has been reviewed and is accurate and complete Lynne Leader

## 2019-07-12 NOTE — Patient Instructions (Signed)
Thank you for coming in today.  Plan for epidural steroid injection.  Call [facility] and schedule at [phone]  Use the STAX splint on your finger size 5.   Recheck in 2 weeks.  Get xray.   Epidural Steroid Injection Patient Information  Description: The epidural space surrounds the nerves as they exit the spinal cord.  In some patients, the nerves can be compressed and inflamed by a bulging disc or a tight spinal canal (spinal stenosis).  By injecting steroids into the epidural space, we can bring irritated nerves into direct contact with a potentially helpful medication.  These steroids act directly on the irritated nerves and can reduce swelling and inflammation which often leads to decreased pain.  Epidural steroids may be injected anywhere along the spine and from the neck to the low back depending upon the location of your pain.   After numbing the skin with local anesthetic (like Novocaine), a small needle is passed into the epidural space slowly.  You may experience a sensation of pressure while this is being done.  The entire block usually last less than 10 minutes.  Conditions which may be treated by epidural steroids:   Low back and leg pain  Neck and arm pain  Spinal stenosis  Post-laminectomy syndrome  Herpes zoster (shingles) pain  Pain from compression fractures  Preparation for the injection:  1. Do not eat any solid food or dairy products within 8 hours of your appointment.  2. You may drink clear liquids up to 3 hours before appointment.  Clear liquids include water, black coffee, juice or soda.  No milk or cream please. 3. You may take your regular medication, including pain medications, with a sip of water before your appointment  Diabetics should hold regular insulin (if taken separately) and take 1/2 normal NPH dos the morning of the procedure.  Carry some sugar containing items with you to your appointment. 4. A driver must accompany you and be  prepared to drive you home after your procedure.  5. Bring all your current medications with your. 6. An IV may be inserted and sedation may be given at the discretion of the physician.   7. A blood pressure cuff, EKG and other monitors will often be applied during the procedure.  Some patients may need to have extra oxygen administered for a short period. 8. You will be asked to provide medical information, including your allergies, prior to the procedure.  We must know immediately if you are taking blood thinners (like Coumadin/Warfarin)  Or if you are allergic to IV iodine contrast (dye). We must know if you could possible be pregnant.  Possible side-effects:  Bleeding from needle site  Infection (rare, may require surgery)  Nerve injury (rare)  Numbness & tingling (temporary)  Difficulty urinating (rare, temporary)  Spinal headache ( a headache worse with upright posture)  Light -headedness (temporary)  Pain at injection site (several days)  Decreased blood pressure (temporary)  Weakness in arm/leg (temporary)  Pressure sensation in back/neck (temporary)  Call if you experience:  Fever/chills associated with headache or increased back/neck pain.  Headache worsened by an upright position.  New onset weakness or numbness of an extremity below the injection site  Hives or difficulty breathing (go to the emergency room)  Inflammation or drainage at the infection site  Severe back/neck pain  Any new symptoms which are concerning to you  Please note:  Although the local anesthetic injected can often make your back or neck  feel good for several hours after the injection, the pain will likely return.  It takes 3-7 days for steroids to work in the epidural space.  You may not notice any pain relief for at least that one week.  If effective, we will often do a series of three injections spaced 3-6 weeks apart to maximally decrease your pain.  After the initial series, we  generally will wait several months before considering a repeat injection of the same type.

## 2019-07-12 NOTE — Progress Notes (Signed)
MRI cervical spine does show some bulging disc

## 2019-07-13 ENCOUNTER — Telehealth: Payer: Self-pay | Admitting: Family Medicine

## 2019-07-13 DIAGNOSIS — M5412 Radiculopathy, cervical region: Secondary | ICD-10-CM

## 2019-07-13 DIAGNOSIS — R200 Anesthesia of skin: Secondary | ICD-10-CM

## 2019-07-13 NOTE — Telephone Encounter (Signed)
Patient called stating that he has had a couple [place] calls but when he answers, the phone cuts out. He said that Dr Georgina Snell was referring him for a nerve study and he thought it might have been them. I see an order for it but not where it was sent to.. Can you help with this please?

## 2019-07-14 ENCOUNTER — Encounter: Payer: Self-pay | Admitting: Physical Therapy

## 2019-07-14 NOTE — Telephone Encounter (Signed)
Please call [facility] to schedule the EMG/nerve study. [phone]

## 2019-07-14 NOTE — Telephone Encounter (Signed)
Sent MyChart message to pt w/ this info as he does not have a voicemail set up on his phone.

## 2019-07-20 ENCOUNTER — Inpatient Hospital Stay: Admission: RE | Admit: 2019-07-20 | Payer: 59 | Source: Ambulatory Visit

## 2019-07-26 ENCOUNTER — Encounter: Payer: Self-pay | Admitting: Family Medicine

## 2019-07-26 ENCOUNTER — Ambulatory Visit: Payer: 59 | Attending: Family Medicine | Admitting: Family Medicine

## 2019-07-26 ENCOUNTER — Ambulatory Visit (INDEPENDENT_AMBULATORY_CARE_PROVIDER_SITE_OTHER): Payer: 59 | Admitting: Family Medicine

## 2019-07-26 ENCOUNTER — Other Ambulatory Visit: Payer: Self-pay

## 2019-07-26 ENCOUNTER — Ambulatory Visit (INDEPENDENT_AMBULATORY_CARE_PROVIDER_SITE_OTHER): Payer: 59

## 2019-07-26 VITALS — BP 140/80 | HR 84 | Ht 62.0 in | Wt 166.0 lb

## 2019-07-26 DIAGNOSIS — S62630A Displaced fracture of distal phalanx of right index finger, initial encounter for closed fracture: Secondary | ICD-10-CM

## 2019-07-26 DIAGNOSIS — R200 Anesthesia of skin: Secondary | ICD-10-CM | POA: Diagnosis not present

## 2019-07-26 DIAGNOSIS — F3161 Bipolar disorder, current episode mixed, mild: Secondary | ICD-10-CM | POA: Diagnosis not present

## 2019-07-26 DIAGNOSIS — S62639A Displaced fracture of distal phalanx of unspecified finger, initial encounter for closed fracture: Secondary | ICD-10-CM | POA: Diagnosis not present

## 2019-07-26 DIAGNOSIS — G542 Cervical root disorders, not elsewhere classified: Secondary | ICD-10-CM

## 2019-07-26 DIAGNOSIS — R0981 Nasal congestion: Secondary | ICD-10-CM | POA: Diagnosis not present

## 2019-07-26 DIAGNOSIS — M5412 Radiculopathy, cervical region: Secondary | ICD-10-CM | POA: Diagnosis not present

## 2019-07-26 IMAGING — DX DG FINGER INDEX 2+V*R*
3 series · 3 of 3 positions shown · non-contrast
Comparison: [DATE]

CLINICAL DATA: Pain

EXAM:
RIGHT INDEX FINGER 2+V

[finger pa]
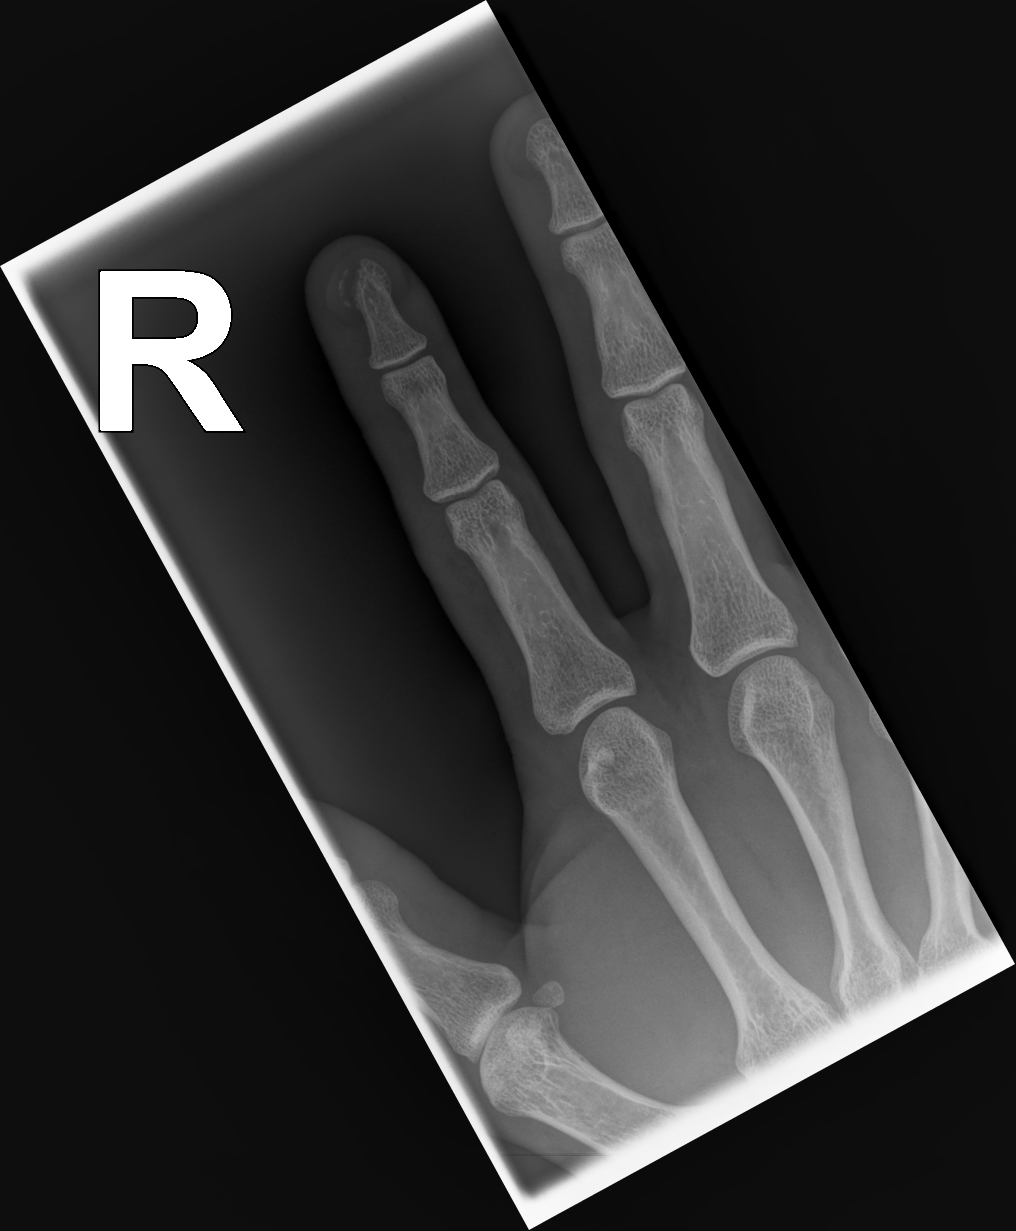

[finger mlo]
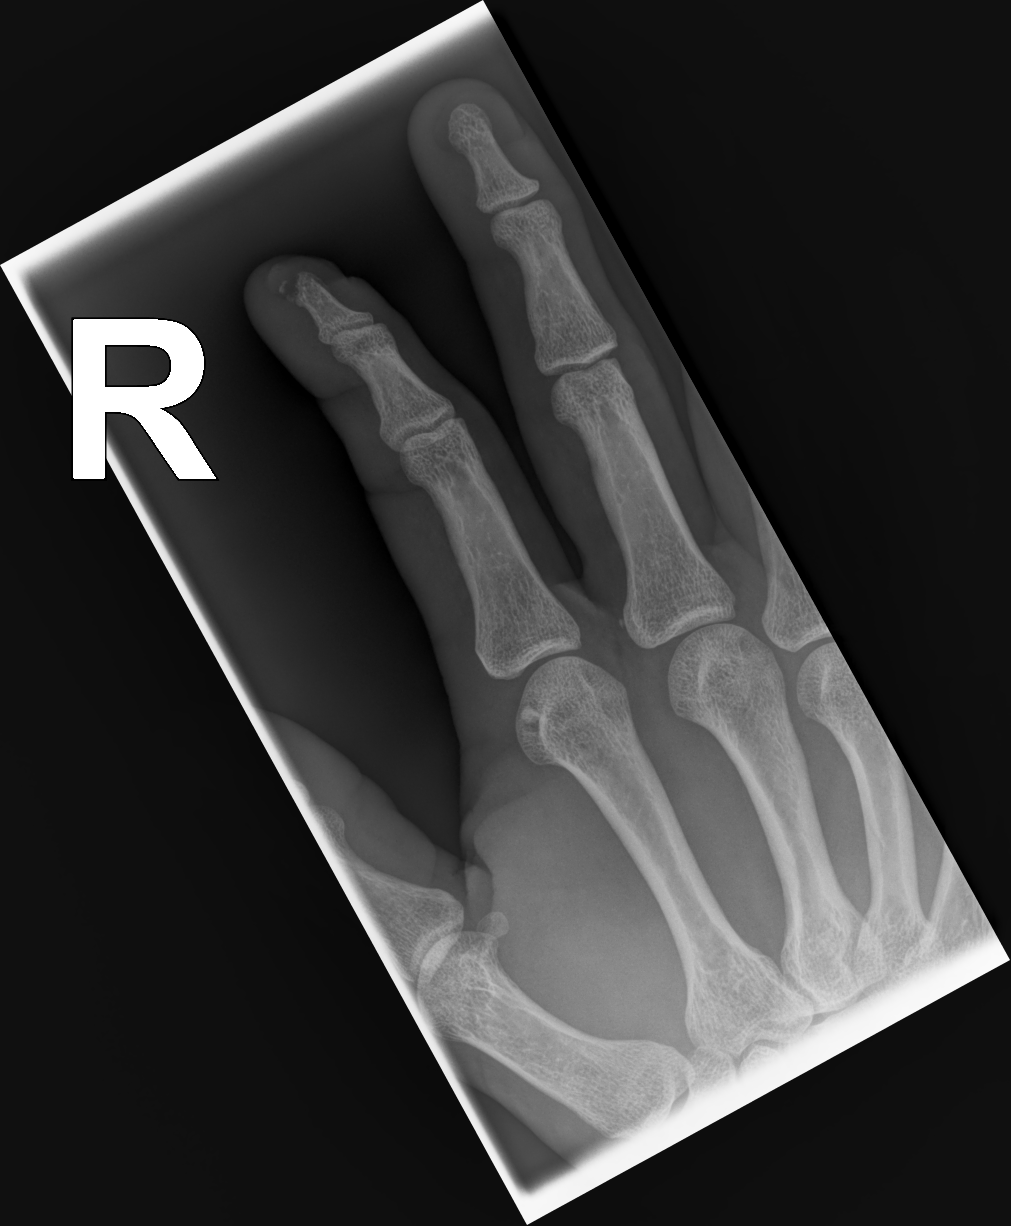

[2. finger lat]
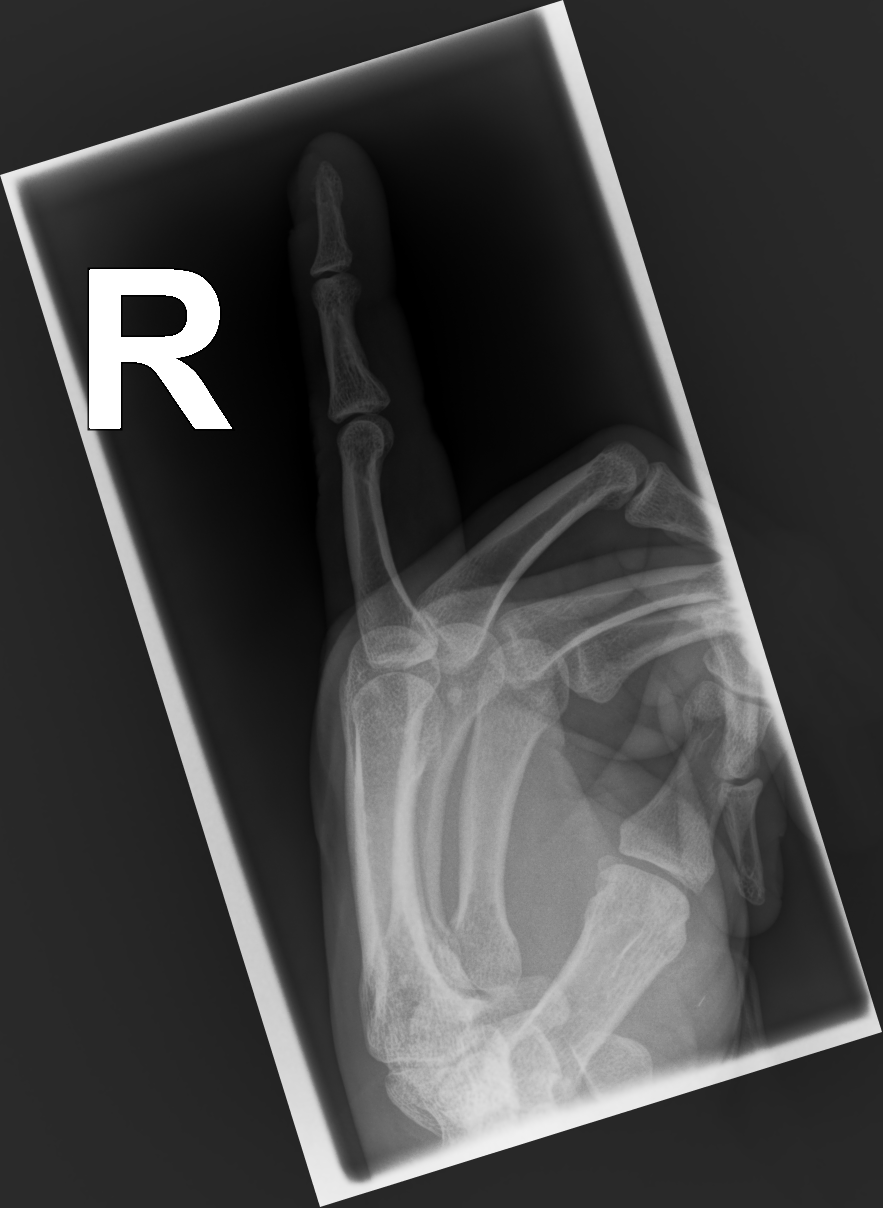

[3 of 3 positions shown; findings below may reference images not displayed]

FINDINGS: There an acute displaced fracture the tuft the distal phalanx second
digit. There is surrounding soft tissue swelling. The fracture
alignment is essentially unchanged from the prior study. There is no
evidence for interval healing.
IMPRESSION: Displaced fracture of the tuft of the distal phalanx of the second
digit. No evidence for interval healing.

## 2019-07-26 MED ORDER — METHOCARBAMOL 750 MG PO TABS: 750.0000 mg | ORAL_TABLET | Freq: Three times a day (TID) | ORAL | 1 refills | Status: DC | PRN

## 2019-07-26 MED ORDER — QUETIAPINE FUMARATE 100 MG PO TABS: 100.0000 mg | ORAL_TABLET | Freq: Every day | ORAL | 6 refills | Status: DC

## 2019-07-26 MED ORDER — FLUTICASONE PROPIONATE 50 MCG/ACT NA SUSP: 2.0000 | Freq: Every day | NASAL | 1 refills | Status: DC

## 2019-07-26 NOTE — Progress Notes (Signed)
Virtual Visit via Telephone Note  I connected with Jimmy [name], on 07/26/2019 at 4:28 PM by telephone due to the COVID-19 pandemic and verified that I am speaking with the correct person using two identifiers.   Consent: I discussed the limitations, risks, security and privacy concerns of performing an evaluation and management service by telephone and the availability of in person appointments. I also discussed with the patient that there may be a patient responsible charge related to this service. The patient expressed understanding and agreed to proceed.   Location of Patient: Home  Location of Provider: Clinic   Persons participating in Telemedicine visit: Valton Happ Farrington-CMA Dr. Margarita Rana     History of Present Illness: Jimmy [name] with migraine headaches who presents today for follow-up visit. Diagnosed with ADHD, Bipolar, Anxiety while in jail and was placed on Seroquel.  He complains of pain in his R hand to his shoulder blade and this has been on for 6-7 years and he is currently followed by Sports Medicine for cervical radiculopathy and he underwent PT with no relief. He has dropped a sledge hammer due to his R arm giving out. Scheduled for steroid injection later this month. Next month he has an appointment for a nerve conduction study Gabapentin has been ineffective and he was unable to obtain Lyrica due to insurance. He stopped taking Amitryptyline as it was thought that headaches were from his neck tightness  MRI C-spine revealed IMPRESSION: 1. Right eccentric disc bulge with uncovertebral hypertrophy at C3-4 with resultant mild canal and right C4 foraminal stenosis. 2. Mild right C3 and C5 foraminal stenosis related to disc bulge and uncovertebral hypertrophy. No other significant canal or foraminal stenosis within the cervical spine.   He had a dental extraction and ever since he has had 'problems' with the R side of his nose, and his  voice has changed, he feels stuffed up. This procedure occurred on 4/10. He was prescribed antibiotics and Allergy pills which have not helped. He is doing well on Seroquel for his bipolar disorder.  Past Medical History:  Diagnosis Date  . ADHD (attention deficit hyperactivity disorder)   . Anxiety   . Arthritis   . Cancer ([place])    skin cancer  . Chest pain 08/2018  . GERD (gastroesophageal reflux disease)    no meds  . Migraine    migraines due to pinched nerve  . Pinched nerve in neck   . Pinched nerve in neck   . Scoliosis    Allergies  Allergen Reactions  . Cyclobenzaprine Nausea Only and Hives  . Depakote [Divalproex Sodium] Other (See Comments)    "made him crazy"  . Naproxen Nausea Only    Acid reflux  . Other     [facility]-pt states skin became reddened and he tasted metal (08-2018)  . Ritalin [Methylphenidate Hcl] Other (See Comments)    Increased hyper activeness   . Tramadol Other (See Comments)    Acid reflux    Current Outpatient Medications on File Prior to Visit  Medication Sig Dispense Refill  . QUEtiapine (SEROQUEL) 100 MG tablet Take 1 tablet (100 mg total) by mouth at bedtime. 30 tablet 0  . rizatriptan (MAXALT) 10 MG tablet Take 1 tablet earliest onset of headache.  May repeat in 2 hours if needed.  Maximum 2 tablets in 24 hours. 10 tablet 3  . acetaminophen (TYLENOL) 500 MG tablet Take 1 tablet (500 mg total) by mouth every 6 (six) hours as  needed. (Patient not taking: Reported on 07/26/2019) 30 tablet 0  . nicotine (NICODERM CQ) 14 mg/24hr patch Place 1 patch (14 mg total) onto the skin daily. (Patient not taking: Reported on 07/26/2019) 28 patch 1  . omeprazole (PRILOSEC) 40 MG capsule Take 40 mg by mouth 2 (two) times daily.    . predniSONE (DELTASONE) 50 MG tablet Take 1 tablet (50 mg total) by mouth daily. (Patient not taking: Reported on 07/26/2019) 5 tablet 0  . pregabalin (LYRICA) 75 MG capsule Take 1 capsule (75 mg total) by mouth 2 (two) times  daily. For nerve pain. Replaces gabapentin (Patient not taking: Reported on 07/26/2019) 60 capsule 1  . [DISCONTINUED] famotidine (PEPCID) 40 MG tablet Take 1 tablet (40 mg total) by mouth daily. 90 tablet 1   No current facility-administered medications on file prior to visit.    Observations/Objective: Awake, alert, oriented x3 Not in acute distress  Assessment and Plan: 1. Bipolar disorder, current episode mixed, mild (HCC) Stable - QUEtiapine (SEROQUEL) 100 MG tablet; Take 1 tablet (100 mg total) by mouth at bedtime.  Dispense: 30 tablet; Refill: 6  2. Cervical nerve root impingement Uncontrolled Schedule for upcoming steroid injection and nerve conduction studies Followed by sports medicine  Advised to communicate with them that symptoms are still uncontrolled meanwhile I have refilled Robaxin - methocarbamol (ROBAXIN-750) 750 MG tablet; Take 1 tablet (750 mg total) by mouth every 8 (eight) hours as needed for muscle spasms.  Dispense: 90 tablet; Refill: 1  3. Sinus congestion We will place on Flonase and if symptoms persist he will need to get in touch with his dentist - fluticasone (FLONASE) 50 MCG/ACT nasal spray; Place 2 sprays into both nostrils daily.  Dispense: 16 g; Refill: 1   Follow Up Instructions: 6 months for chronic disease management   I discussed the assessment and treatment plan with the patient. The patient was provided an opportunity to ask questions and all were answered. The patient agreed with the plan and demonstrated an understanding of the instructions.   The patient was advised to call back or seek an in-person evaluation if the symptoms worsen or if the condition fails to improve as anticipated.     I provided 13 minutes total of non-face-to-face time during this encounter including median intraservice time, reviewing previous notes, investigations, ordering medications, medical decision making, coordinating care and patient verbalized understanding  at the end of the visit.     Charlott Rakes, MD, FAAFP. [facility] [place], [place]   07/26/2019, 4:28 PM

## 2019-07-26 NOTE — Progress Notes (Signed)
Patient is having pain in right arm and right side of ribcage.

## 2019-07-26 NOTE — Patient Instructions (Addendum)
Please call [facility] to schedule your EMG and Nerve Conduction Velocity test at [phone].  Get your voicemail set up.  Get xray today.

## 2019-07-26 NOTE — Progress Notes (Signed)
   [name], am serving as a Education administrator for Dr. Lynne Leader.  Jimmy [name] is a 29 y.o. male who presents to [place] at [facility] today for f/u of R distal index finger pain after smashing it w/ a sledgehammer.  He was last seen by Dr. Georgina Snell on 07/12/19 and was provided w a Stax splint.  He's also f/u for con't neck and B shoulder pain, R>L w/ paresthesias noted in his B UE as well.  He has a c-spine epidural scheduled for 08/05/19 and has been referred for an UE NCV.  Pt was called to schedule his NCV/EMG on 07/20/19 but due to not having a voicemail set-up, they were unable to leave a message.  Since his last visit, pt reports still having some pain overall much improved in his right index finger.  R arm/shoulder is still in constant pain with epidural steroid injection scheduled for the 20th..   Pertinent review of systems: No fevers or chills  Relevant historical information: History opiate addiction in the past.   Exam:  BP 140/80 (BP Location: Left Arm, Patient Position: Sitting, Cuff Size: Normal)   Pulse 84   Ht 5\' 2"  (1.575 m)   Wt 166 lb (75.3 kg)   SpO2 98%   BMI 30.36 kg/m  General: Well Developed, well nourished, and in no acute distress.   MSK: Right index finger subungual hematoma present.  No erythema or induration.  Normal motion finger     Lab and Radiology Results  X-ray images right index finger obtained today personally independently reviewed Continue tuft fracture with some periosteal ribs option. Await formal radiology review    Assessment and Plan: 29 y.o. male with right index finger distal phalanx fracture.  X-ray does show some periapical resorption but not a lot of healing yet.  However radiology overread is still pending. Plan continued stax splint. Recheck in something like a month.  Anticipate follow-up after nerve conduction study should be around about a month from now.  Absolutely happy to see back sooner if  needed.  Cervical radiculopathy: Plan for epidural steroid injection as above.  Should be done in 10 days from now.  Arm pain and numbness and tingling.  Gave patient phone number for neurology office to contact to schedule EMG.  Advised him to set up a voicemail so it is easier for other offices to schedule him for an appointment.   PDMP not reviewed this encounter. Orders Placed This Encounter  Procedures  . DG Finger Index Right    Standing Status:   Future    Standing Expiration Date:   09/24/2020    Order Specific Question:   Reason for Exam (SYMPTOM  OR DIAGNOSIS REQUIRED)    Answer:   eval finger fx    Order Specific Question:   Preferred imaging location?    Answer:   [facility]    Order Specific Question:   Radiology Contrast Protocol - do NOT remove file path    Answer:   \\[email or web address]   No orders of the defined types were placed in this encounter.    Discussed warning signs or symptoms. Please see discharge instructions. Patient expresses understanding.   The above documentation has been reviewed and is accurate and complete Lynne Leader

## 2019-07-27 ENCOUNTER — Encounter: Payer: Self-pay | Admitting: Physical Therapy

## 2019-07-27 ENCOUNTER — Telehealth: Payer: Self-pay | Admitting: Family Medicine

## 2019-07-27 DIAGNOSIS — M25811 Other specified joint disorders, right shoulder: Secondary | ICD-10-CM

## 2019-07-27 DIAGNOSIS — M25511 Pain in right shoulder: Secondary | ICD-10-CM

## 2019-07-27 NOTE — Progress Notes (Signed)
X-ray does not show a lot of healing.  Use the splint as much as possible especially when active.  Plan to recheck this x-ray in about a month.  I am hopeful that you will have had the nerve study by then we can review the nerve study and recheck x-ray at that point.

## 2019-07-27 NOTE — Telephone Encounter (Signed)
Pt wants an MRI of his R shoulder because he is most concerned about his R shoulder pain and popping.  Or you can refer him to an orthopedic surgeon.

## 2019-07-27 NOTE — Telephone Encounter (Signed)
Please ask the patient to clarify MRI of what body part? We already did a MRI of the cervical spine.

## 2019-07-27 NOTE — Telephone Encounter (Signed)
Sent pt a MyChart message to clarify which body part he thinks he needs an MRI of.

## 2019-07-27 NOTE — Telephone Encounter (Signed)
Patient called asking if the MRI could be ordered for him. He would like to go ahead and have this done if possible.

## 2019-07-27 NOTE — Telephone Encounter (Signed)
Patient called asking if someone could call him to discuss something. He would not go into detail with me.

## 2019-07-28 ENCOUNTER — Encounter: Payer: Self-pay | Admitting: Physical Therapy

## 2019-07-28 DIAGNOSIS — S43431A Superior glenoid labrum lesion of right shoulder, initial encounter: Secondary | ICD-10-CM

## 2019-07-28 NOTE — Telephone Encounter (Signed)
Sent MyChart message to pt w/ appropriate information.  Also provided phone number to [facility] if he has any questions or concerns.

## 2019-07-28 NOTE — Telephone Encounter (Signed)
Plan for shoulder MRI arthrogram at bedside to [facility].  We will schedule with Dr. Darene Lamer 1 hour prior to the MRI for the injection component of the MRI.  Please contact patient to explain to him the protocol.  To truly evaluate popping and clicking in the shoulder we did look at the labrum which requires MRI arthrogram.

## 2019-08-03 MED ORDER — DOXYCYCLINE HYCLATE 100 MG PO TABS
100.0000 mg | ORAL_TABLET | Freq: Two times a day (BID) | ORAL | 0 refills | Status: AC
Start: 2019-08-03 — End: 2019-08-10

## 2019-08-05 ENCOUNTER — Other Ambulatory Visit: Payer: Self-pay | Admitting: Family Medicine

## 2019-08-05 ENCOUNTER — Ambulatory Visit
Admission: RE | Admit: 2019-08-05 | Discharge: 2019-08-05 | Disposition: A | Discharge status: Home / Self Care | Payer: 59 | Source: Ambulatory Visit | Attending: Family Medicine | Admitting: Family Medicine

## 2019-08-05 ENCOUNTER — Other Ambulatory Visit: Payer: Self-pay

## 2019-08-05 DIAGNOSIS — M5412 Radiculopathy, cervical region: Secondary | ICD-10-CM

## 2019-08-05 IMAGING — XA DG INJECT/[PERSON_NAME] INC NEEDLE/CATH/PLC EPI/CERV/THOR W/IMG
2 series · 2 of 2 positions shown · non-contrast
Comparison: none

CLINICAL DATA: Cervical spondylosis without myelopathy. Neck and
shoulder pain and numbness in the upper extremities, right worse
than left.

[Series 1: ortho adipose · 1 of 1 slices shown (1 of 2)]
[im 1/1]
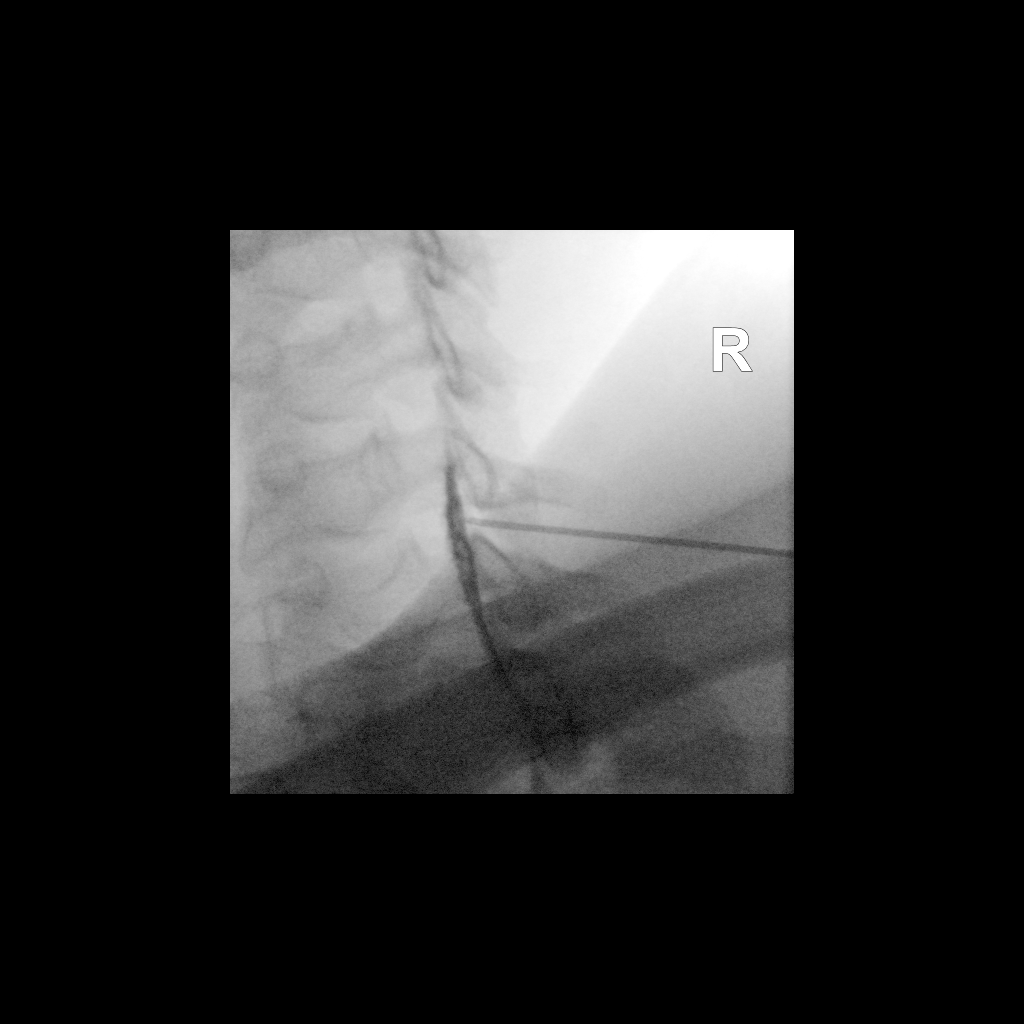

[Series 2: ortho adipose · 1 of 1 slices shown (2 of 2)]
[im 1/1]
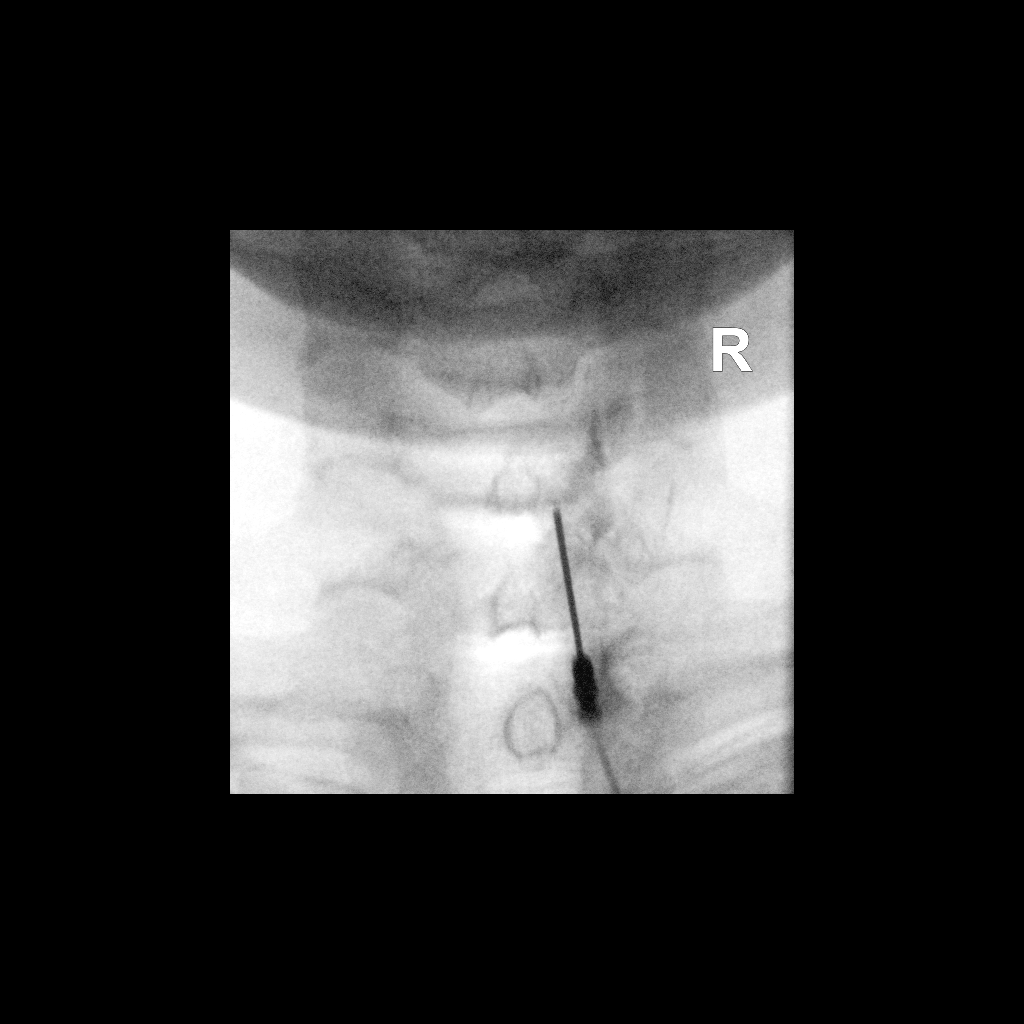

[2 of 2 positions shown; findings below may reference images not displayed]

FLUOROSCOPY TIME:  Fluoroscopy Time: 26 seconds

Radiation Exposure Index: 10.11 microGray*m^2

PROCEDURE:
The procedure, risks, benefits, and alternatives were explained to
the patient. Questions regarding the procedure were encouraged and
answered. The patient understands and consents to the procedure.

CERVICAL EPIDURAL INJECTION

An interlaminar approach was performed on the right at C6-7. A
inch 20 gauge epidural needle was advanced using loss-of-resistance
technique.

DIAGNOSTIC EPIDURAL INJECTION

Injection of Isovue-M 300 shows a good epidural pattern with spread
above and below the level of needle placement, primarily on the
right. No vascular opacification is seen.

THERAPEUTIC EPIDURAL INJECTION

1.5 ml of Kenalog 40 mixed with 2 ml of normal saline were then
instilled. The procedure was well-tolerated, and the patient was
discharged thirty minutes following the injection in good condition.
IMPRESSION: Technically successful interlaminar epidural injection on the right
at C6-7.

## 2019-08-05 MED ORDER — IOPAMIDOL (ISOVUE-M 300) INJECTION 61%
1.0000 mL | Freq: Once | INTRAMUSCULAR | Status: AC
  Administered 2019-08-05: 1 mL via EPIDURAL

## 2019-08-05 MED ORDER — TRIAMCINOLONE ACETONIDE 40 MG/ML IJ SUSP (RADIOLOGY)
60.0000 mg | Freq: Once | INTRAMUSCULAR | Status: AC
  Administered 2019-08-05: 60 mg via EPIDURAL

## 2019-08-05 NOTE — Discharge Instructions (Signed)

## 2019-08-08 ENCOUNTER — Ambulatory Visit
Admission: EM | Admit: 2019-08-08 | Discharge: 2019-08-08 | Disposition: A | Discharge status: Home / Self Care | Payer: 59

## 2019-08-08 ENCOUNTER — Encounter: Payer: Self-pay | Admitting: Emergency Medicine

## 2019-08-08 ENCOUNTER — Ambulatory Visit (INDEPENDENT_AMBULATORY_CARE_PROVIDER_SITE_OTHER): Payer: 59

## 2019-08-08 DIAGNOSIS — R1031 Right lower quadrant pain: Secondary | ICD-10-CM | POA: Diagnosis not present

## 2019-08-08 DIAGNOSIS — M79644 Pain in right finger(s): Secondary | ICD-10-CM

## 2019-08-08 DIAGNOSIS — S6991XA Unspecified injury of right wrist, hand and finger(s), initial encounter: Secondary | ICD-10-CM | POA: Diagnosis not present

## 2019-08-08 DIAGNOSIS — S62630A Displaced fracture of distal phalanx of right index finger, initial encounter for closed fracture: Secondary | ICD-10-CM | POA: Diagnosis not present

## 2019-08-08 IMAGING — DX DG FINGER INDEX 2+V*R*
3 series · 3 of 3 positions shown · non-contrast
Comparison: [DATE]

CLINICAL DATA: Trauma [DATE].

EXAM:
RIGHT INDEX FINGER 2+V

[finger mlo]
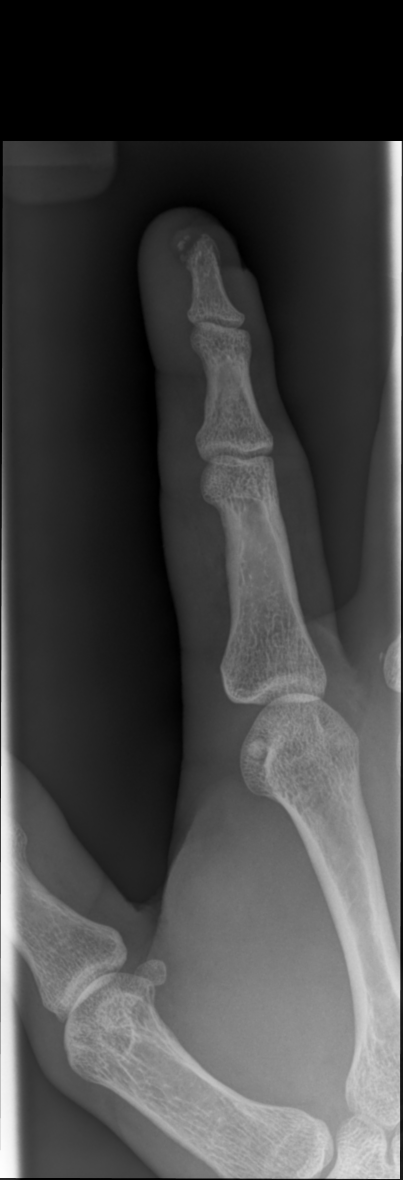

[finger pa]
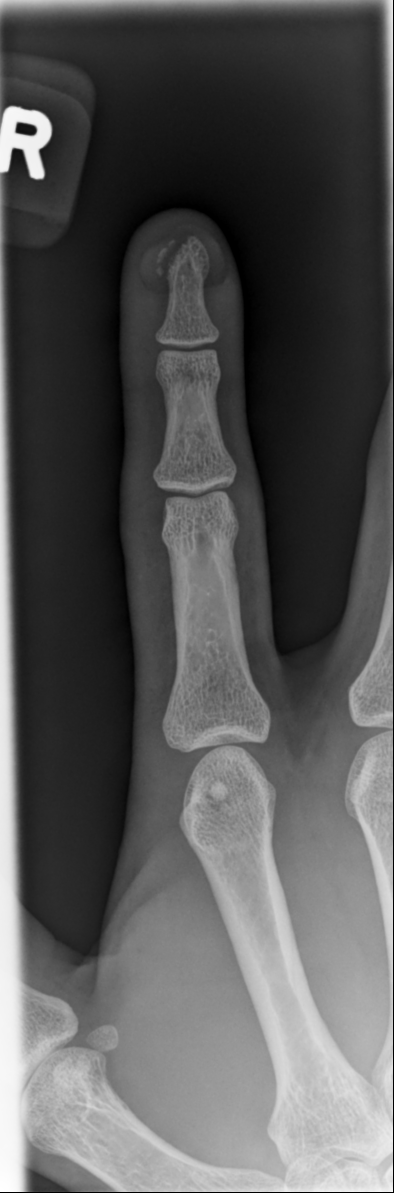

[2. finger lat]
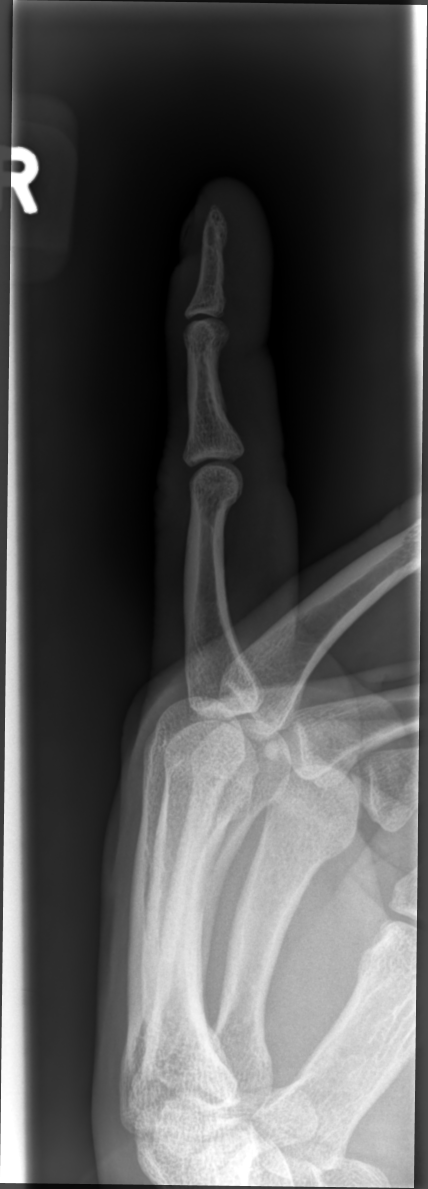

[3 of 3 positions shown; findings below may reference images not displayed]

FINDINGS: The known tuft fracture of the distal second digit is again
identified. No interval change in appearance. No bony erosion to
suggest osteomyelitis. No definite interval healing of the bone. No
radiopaque foreign bodies are identified.
IMPRESSION: Known tuft fracture of the distal second phalanx without interval
change.

## 2019-08-08 NOTE — ED Provider Notes (Signed)
[facility]    CSN: [id number] Arrival date & time: 08/08/19  [id number]      History   Chief Complaint Chief Complaint  Patient presents with  . Hand Pain    right  . Abdominal Pain    right side    HPI Jimmy [name] is a 29 y.o. male.   Who presented to the urgent care for complaint of right index finger pain for the past few month.  Report has seen by orthopedic for fracture on his right index finger.  Reported fracture has not been healing and he is worried about infection.  Has been taking doxycycline prophylactically.  Splint was given to him when seen by Ortho and patient reports he has lost his finger splint.  Denies similar symptoms in the past.  Denies chills, fever.  He is also complaining of right lower quadrant pain for the past week.  Report mild nausea with upper back pain. Denies any precipitating event.  Localized pain to the right lower quadrant.  Denies alleviating or aggravating factor. Denies chills, fever, confusion, diplopia  The history is provided by the patient. No language interpreter was used.  Hand Pain Associated symptoms include abdominal pain.  Abdominal Pain   Past Medical History:  Diagnosis Date  . ADHD (attention deficit hyperactivity disorder)   . Anxiety   . Arthritis   . Cancer ([place])    skin cancer  . Chest pain 08/2018  . GERD (gastroesophageal reflux disease)    no meds  . Migraine    migraines due to pinched nerve  . Pinched nerve in neck   . Pinched nerve in neck   . Scoliosis     Patient Active Problem List   Diagnosis Date Noted  . Incisional hernia, without obstruction or gangrene 05/20/2018  . Opiate dependence ([place]) 08/02/2013    Past Surgical History:  Procedure Laterality Date  . ESOPHAGUS SURGERY     as a baby  . VENTRAL HERNIA REPAIR N/A 08/31/2018   Procedure: HERNIA REPAIR VENTRAL ADULT;  Surgeon: Olean Ree, MD;  Location: [facility] ORS;  Service: General;  Laterality: N/A;       Home  Medications    Prior to Admission medications   Medication Sig Start Date End Date Taking? Authorizing Provider  gabapentin (NEURONTIN) 300 MG capsule TAKE 1 CAPSULE BY MOUTH AT BEDTIME FOR 7 DAYS 1 CAPSULE TWICE DAILY FOR 7 DAYS 1 CAPSULE THREE TIMES DAILY MAY DOUBLE WEEKLY TO A MAX OF 360 06/30/19  Yes [provider]  QUEtiapine (SEROQUEL) 100 MG tablet Take by mouth. 08/26/18  Yes [provider]  rizatriptan (MAXALT) 10 MG tablet Take 1 tablet earliest onset of headache.  May repeat in 2 hours if needed.  Maximum 2 tablets in 24 hours. 04/22/19  Yes [provider]  acetaminophen (TYLENOL) 500 MG tablet Take 1 tablet (500 mg total) by mouth every 6 (six) hours as needed. Patient not taking: Reported on 07/26/2019 02/22/19   Wurst, [place], PA-C  doxycycline (VIBRA-TABS) 100 MG tablet Take 1 tablet (100 mg total) by mouth 2 (two) times daily for 7 days. 08/03/19 08/10/19  Gregor Hams, MD  fluticasone (FLONASE) 50 MCG/ACT nasal spray Place 2 sprays into both nostrils daily. 07/26/19   Charlott Rakes, MD  methocarbamol (ROBAXIN-750) 750 MG tablet Take 1 tablet (750 mg total) by mouth every 8 (eight) hours as needed for muscle spasms. 07/26/19   Charlott Rakes, MD  nicotine (NICODERM CQ) 14 mg/24hr patch  Place 1 patch (14 mg total) onto the skin daily. Patient not taking: Reported on 07/26/2019 04/26/19   Charlott Rakes, MD  omeprazole (PRILOSEC) 40 MG capsule Take 40 mg by mouth 2 (two) times daily. 10/08/18 10/08/19  [provider]  predniSONE (DELTASONE) 50 MG tablet Take 1 tablet (50 mg total) by mouth daily. Patient not taking: Reported on 07/26/2019 06/25/19   Gregor Hams, MD  pregabalin (LYRICA) 75 MG capsule Take 1 capsule (75 mg total) by mouth 2 (two) times daily. For nerve pain. Replaces gabapentin Patient not taking: Reported on 07/26/2019 07/01/19   Gregor Hams, MD  QUEtiapine (SEROQUEL) 100 MG tablet Take 1 tablet (100 mg total) by mouth at bedtime.  07/26/19   Charlott Rakes, MD  rizatriptan (MAXALT) 10 MG tablet Take 1 tablet earliest onset of headache.  May repeat in 2 hours if needed.  Maximum 2 tablets in 24 hours. 04/22/19   Pieter Partridge, DO  famotidine (PEPCID) 40 MG tablet Take 1 tablet (40 mg total) by mouth daily. 08/26/18 10/01/18  Robyn Haber, MD    Family History Family History  Problem Relation Age of Onset  . Diabetes Mother   . Diabetes Maternal Grandmother   . Cancer Maternal Grandmother        unknown type. doing chemo  . Cancer Maternal Grandfather   . Diabetes Maternal Aunt     Social History Social History   Tobacco Use  . Smoking status: Current Every Day Smoker    Packs/day: 1.00    Years: 15.00    Pack years: 15.00    Types: Cigarettes  . Smokeless tobacco: Current User  Substance Use Topics  . Alcohol use: Yes    Comment: occ  . Drug use: Not Currently    Types: Cocaine, Marijuana     Allergies   Cyclobenzaprine, Depakote [divalproex sodium], Ritalin [methylphenidate hcl], Naproxen, Other, and Tramadol   Review of Systems Review of Systems  Constitutional: Negative.   Respiratory: Negative.   Cardiovascular: Negative.   Gastrointestinal: Positive for abdominal pain.  Musculoskeletal: Positive for arthralgias.  All other systems reviewed and are negative.    Physical Exam Triage Vital Signs ED Triage Vitals  Enc Vitals Group     BP 08/08/19 1044 (!) 142/68     Pulse Rate 08/08/19 1044 60     Resp 08/08/19 1044 16     Temp 08/08/19 1044 98.1 F (36.7 C)     Temp Source 08/08/19 1044 Oral     SpO2 08/08/19 1044 98 %     Weight --      Height --      Head Circumference --      Peak Flow --      Pain Score 08/08/19 1057 9     Pain Loc --      Pain Edu? --      Excl. in [place]? --    No data found.  Updated Vital Signs BP (!) 142/68 (BP Location: Right Arm)   Pulse 60   Temp 98.1 F (36.7 C) (Oral)   Resp 16   SpO2 98%   Visual Acuity Right Eye Distance:   Left Eye  Distance:   Bilateral Distance:    Right Eye Near:   Left Eye Near:    Bilateral Near:     Physical Exam Vitals and nursing note reviewed.  Constitutional:      General: He is not in acute distress.    Appearance: He  is well-developed and normal weight. He is not ill-appearing, toxic-appearing or diaphoretic.  Cardiovascular:     Rate and Rhythm: Normal rate and regular rhythm.     Pulses: Normal pulses.     Heart sounds: Normal heart sounds. No murmur. No friction rub. No gallop.   Pulmonary:     Effort: Pulmonary effort is normal. No respiratory distress.     Breath sounds: Normal breath sounds. No stridor. No wheezing, rhonchi or rales.  Chest:     Chest wall: No tenderness.  Abdominal:     General: Bowel sounds are normal.     Palpations: There is no mass.     Tenderness: There is abdominal tenderness. There is no right CVA tenderness, left CVA tenderness, guarding or rebound.     Hernia: No hernia is present.  Musculoskeletal:        General: Tenderness present.     Right hand: Swelling and tenderness present.     Left hand: Normal.     Comments: The right index finger with deformity when compared to the left.  Limited range of motion due to pain.  No ecchymosis warmth or open wound present.  Neurovascular status intact.  Neurological:     Mental Status: He is alert.      UC Treatments / Results  Labs (all labs ordered are listed, but only abnormal results are displayed) Labs Reviewed - No data to display  EKG   Radiology DG Finger Index Right  Result Date: 08/08/2019 CLINICAL DATA:  Trauma July 12, 2019. EXAM: RIGHT INDEX FINGER 2+V COMPARISON:  Jul 26, 2019 FINDINGS: The known tuft fracture of the distal second digit is again identified. No interval change in appearance. No bony erosion to suggest osteomyelitis. No definite interval healing of the bone. No radiopaque foreign bodies are identified. IMPRESSION: Known tuft fracture of the distal second phalanx  without interval change. Electronically Signed   By: Dorise Bullion III M.D   On: 08/08/2019 12:25    Procedures Procedures (including critical care time)  Medications Ordered in UC Medications - No data to display  Initial Impression / Assessment and Plan / UC Course  I have reviewed the triage vital signs and the nursing notes.  Pertinent labs & imaging results that were available during my care of the patient were reviewed by me and considered in my medical decision making (see chart for details).    Patient is stable for discharge.  Right index x-ray is positive for fracture of the distal second phalanx, negative for osteomyelitis.  I have reviewed the x-ray myself and the radiologist interpretation.  I am in agreement with the radiologist interpretation. Advised to continue to take antibiotic as prescribed and to completion Patient is also having right lower quadrant pain with mild nausea and back pain.  He was advised to go to ED for further evaluation to rule out other abdominal disease process.  Final Clinical Impressions(s) / UC Diagnoses   Final diagnoses:  Right lower quadrant abdominal pain  Finger pain, right  Closed displaced fracture of distal phalanx of right index finger, initial encounter     Discharge Instructions     The right index finger x-ray is negative for osteomyelitis Patient was advised to go to ED for further evaluation to rule out other abdominal disease process    ED Prescriptions    None     PDMP not reviewed this encounter.   Emerson Monte, [place] 08/08/19 1244

## 2019-08-08 NOTE — Discharge Instructions (Addendum)
The right index finger x-ray is negative for osteomyelitis Patient was advised to go to ED for further evaluation to rule out other abdominal disease process

## 2019-08-08 NOTE — ED Triage Notes (Signed)
Right sided abdominal pain started last Sunday.  It is tender on right side. Pt has rt finger pain for about 1 month has been taking doxycycline.

## 2019-08-09 ENCOUNTER — Other Ambulatory Visit: Payer: Self-pay

## 2019-08-09 ENCOUNTER — Ambulatory Visit (INDEPENDENT_AMBULATORY_CARE_PROVIDER_SITE_OTHER): Payer: No Typology Code available for payment source | Admitting: Sports Medicine

## 2019-08-09 ENCOUNTER — Encounter: Payer: Self-pay | Admitting: Family Medicine

## 2019-08-09 ENCOUNTER — Ambulatory Visit (INDEPENDENT_AMBULATORY_CARE_PROVIDER_SITE_OTHER): Payer: No Typology Code available for payment source

## 2019-08-09 DIAGNOSIS — M25511 Pain in right shoulder: Secondary | ICD-10-CM

## 2019-08-09 DIAGNOSIS — M25811 Other specified joint disorders, right shoulder: Secondary | ICD-10-CM | POA: Diagnosis not present

## 2019-08-09 DIAGNOSIS — G8929 Other chronic pain: Secondary | ICD-10-CM | POA: Diagnosis not present

## 2019-08-09 IMAGING — MR MR SHOULDER*R* W/CM
6 series · 40 of 40 positions shown · IV contrast (agent unspecified)
Comparison: Right shoulder x-rays dated [DATE].

CLINICAL DATA: Chronic right shoulder pain and clicking. No injury
or prior surgery.

EXAM:
MR ARTHROGRAM OF THE RIGHT SHOULDER
TECHNIQUE: Multiplanar, multisequence MR imaging of the right shoulder was
performed following the administration of intra-articular contrast.
CONTRAST:  See Injection Documentation.

[Series 6: T1 fat-sat · axial · 4.0mm · 0.47mm/px · z∈[-37,+57]mm · 7 of 23 slices shown (1 of 4)]
[im 1/23]
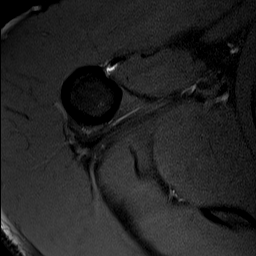
[im 4/23]
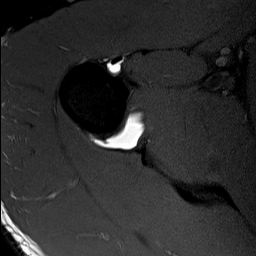
[im 8/23]
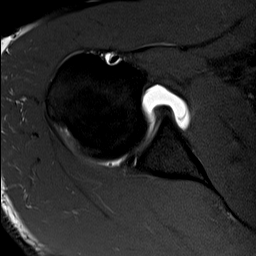
[im 12/23]
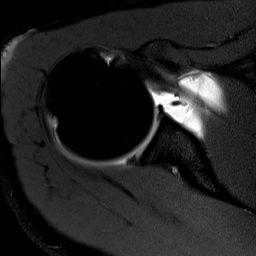
[im 15/23]
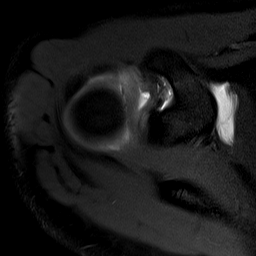
[im 19/23]
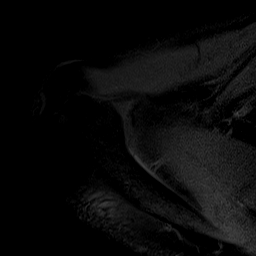
[im 23/23]
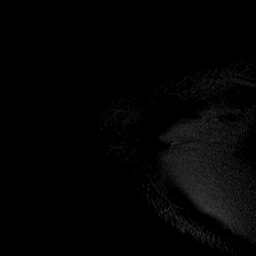

[Series 7: T1 fat-sat · oblique · 4.0mm · 0.55mm/px · 6 of 20 slices shown (2 of 4)]
[im 1/20]
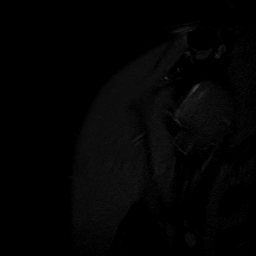
[im 4/20]
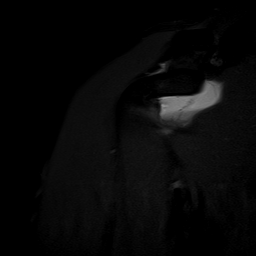
[im 8/20]
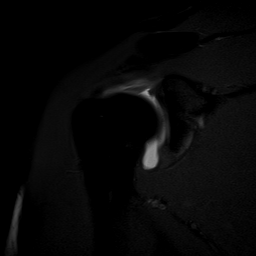
[im 12/20]
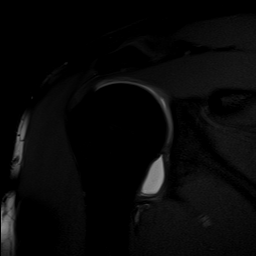
[im 16/20]
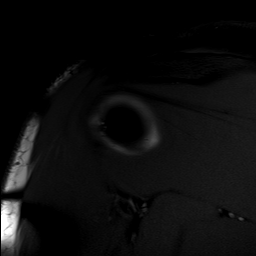
[im 20/20]
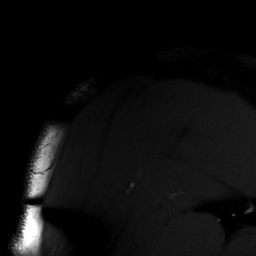

[Series 9: T2 fat-sat · oblique · 4.0mm · 0.55mm/px · 8 of 23 slices shown (1 of 2)]
[im 1/23]
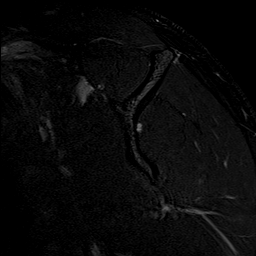
[im 4/23]
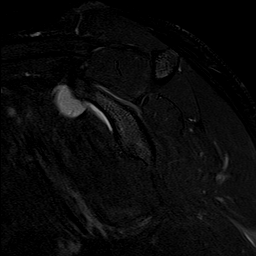
[im 7/23]
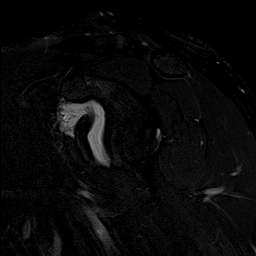
[im 10/23]
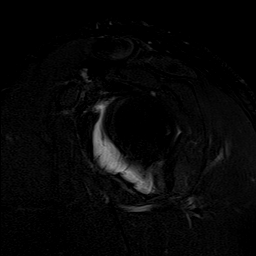
[im 13/23]
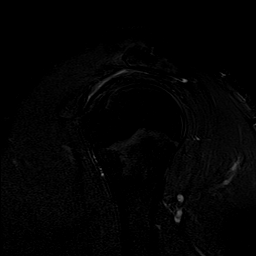
[im 16/23]
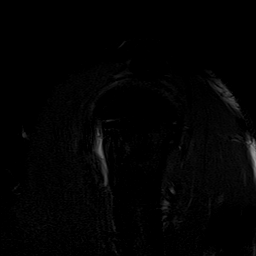
[im 19/23]
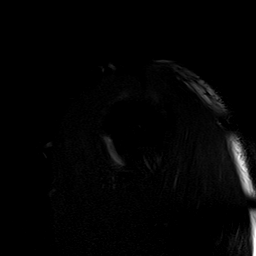
[im 23/23]
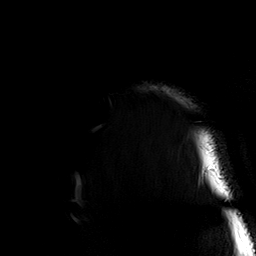

[Series 10: T2 fat-sat · oblique · 4.0mm · 0.55mm/px · 7 of 20 slices shown (2 of 2)]
[im 1/20]
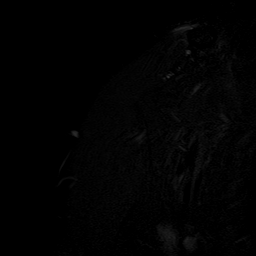
[im 4/20]
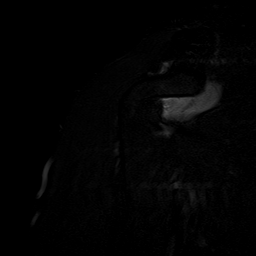
[im 7/20]
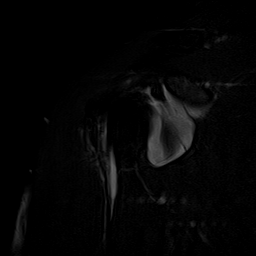
[im 10/20]
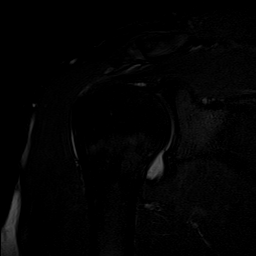
[im 13/20]
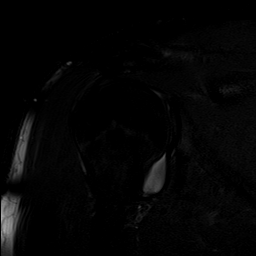
[im 16/20]
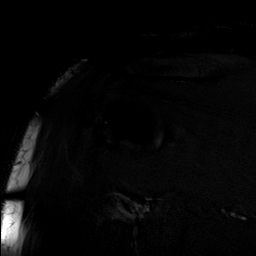
[im 20/20]
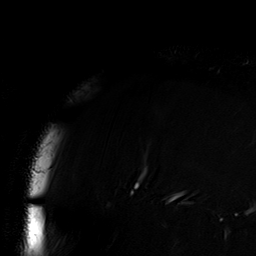

[Series 11: T1 fat-sat · oblique · non-contrast · 4.0mm · 0.44mm/px · 7 of 20 slices shown (3 of 4)]
[im 1/20]
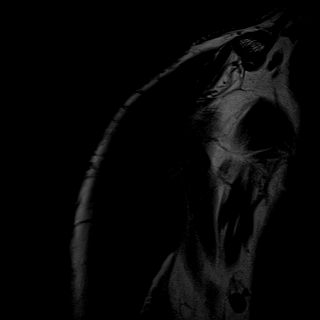
[im 4/20]
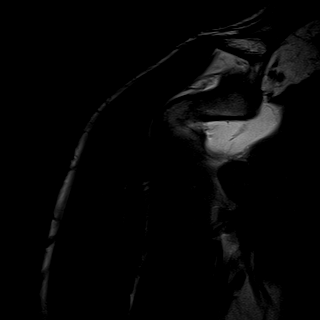
[im 7/20]
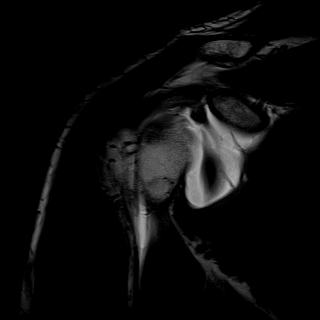
[im 10/20]
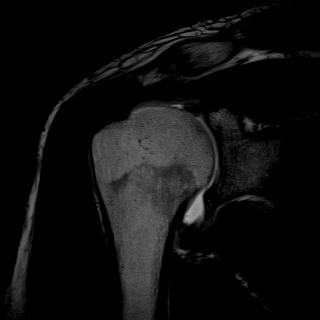
[im 13/20]
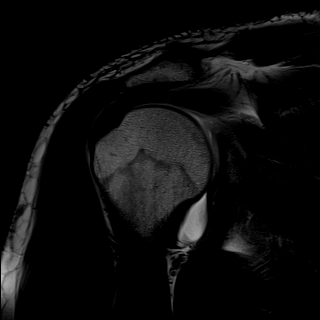
[im 16/20]
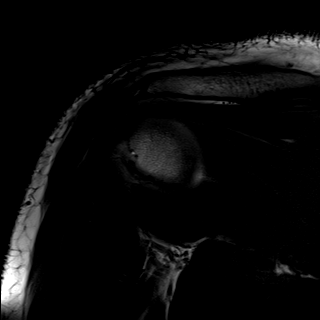
[im 20/20]
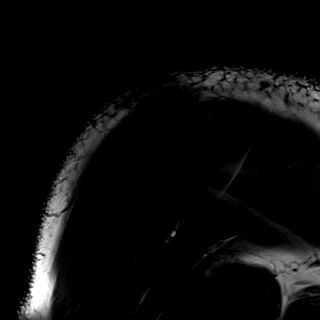

[Series 16: T1 fat-sat · sagittal · 4.0mm · 0.55mm/px · 5 of 15 slices shown (4 of 4)]
[im 1/15]
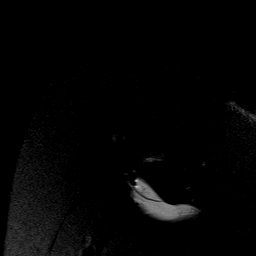
[im 4/15]
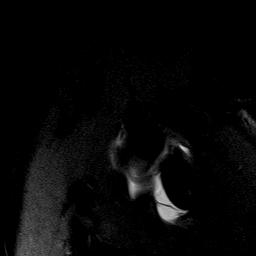
[im 8/15]
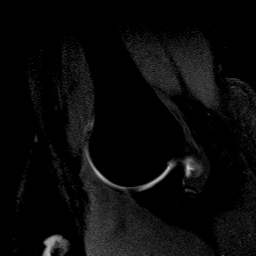
[im 11/15]
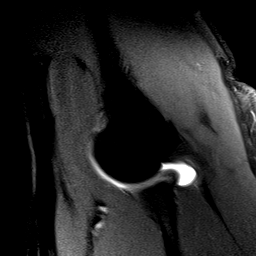
[im 15/15]
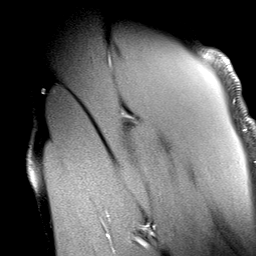

[40 of 40 positions shown; findings below may reference images not displayed]

FINDINGS: Rotator cuff:  Intact without significant tendinosis.

Muscles:  No focal muscular atrophy or edema.

Biceps long head:  Intact and normally positioned.

Acromioclavicular Joint: The acromion is type II. Normal
acromioclavicular joint. Trace fluid but no contrast within the
subacromial/subdeltoid bursa.

Glenohumeral Joint: Distended with intra-articular contrast. No
chondral defect.

Labrum: Contrast signal undercutting the posteroinferior labrum
(series 6, images 14-16; series 9, image 17). Remaining labrum is
intact.

Bones: No acute or significant extra-articular osseous findings.

Other: None.
IMPRESSION: 1. Posteroinferior labral tear.
2. Intact rotator cuff.

## 2019-08-09 NOTE — Progress Notes (Signed)
    Procedures performed today:    Procedure: Real-time Ultrasound Guided gadolinium contrast injection of right glenohumeral joint Device: Samsung HS60  Verbal informed consent obtained.  Time-out conducted.  Noted no overlying erythema, induration, or other signs of local infection.  Skin prepped in a sterile fashion.  Local anesthesia: Topical Ethyl chloride.  With sterile technique and under real time ultrasound guidance: I advanced a 22-gauge spinal needle into the glenohumeral joint from a posterior approach, I then injected 1 cc Kenalog 40, 2 cc lidocaine, 2 cc bupivacaine, syringe switched and 0.1 cc lidocaine injected, syringe again switched and 10 cc sterile saline used to distend the joint. Joint visualized and capsule seen distending confirming intra-articular placement of contrast material and medication. Completed without difficulty  Advised to call if fevers/chills, erythema, induration, drainage, or persistent bleeding.  Images permanently stored and available for review in the ultrasound unit.  Impression: Technically successful ultrasound guided gadolinium contrast injection for MR arthrography.  Please see separate MR arthrogram report.   Independent interpretation of notes and tests performed by another provider:   None.  Brief History, Exam, Impression, and Recommendations:    Chronic right shoulder pain Jayston has had 7 years of right shoulder pain, with popping and other mechanical symptoms. He had a cervical epidural with no relief. He has been seeing Dr. Georgina Snell with plans for a shoulder MR arthrogram, he is referred to me for the arthrogram injection, this was performed today, further management per primary treating provider.    ___________________________________________ Gwen Her. Dianah Field, M.D., ABFM., CAQSM. Primary Care and [place] Instructor of [employer] of [facility] of Medicine

## 2019-08-09 NOTE — Assessment & Plan Note (Signed)
Jimmy [name] has had 7 years of right shoulder pain, with popping and other mechanical symptoms. He had a cervical epidural with no relief. He has been seeing Dr. Georgina Snell with plans for a shoulder MR arthrogram, he is referred to me for the arthrogram injection, this was performed today, further management per primary treating provider.

## 2019-08-10 ENCOUNTER — Encounter: Payer: Self-pay | Admitting: Family Medicine

## 2019-08-10 NOTE — Progress Notes (Signed)
MRI does show a labrum tear.  Please schedule follow-up appointment with me about a week after your nerve conduction study on June 9.  This would be mid June.

## 2019-08-12 ENCOUNTER — Telehealth: Payer: Self-pay | Admitting: Orthopaedic Surgery

## 2019-08-12 NOTE — Addendum Note (Signed)
Addended by: Gregor Hams on: 08/12/2019 07:19 AM   Modules accepted: Orders

## 2019-08-20 ENCOUNTER — Ambulatory Visit (INDEPENDENT_AMBULATORY_CARE_PROVIDER_SITE_OTHER): Payer: No Typology Code available for payment source | Admitting: Orthopaedic Surgery

## 2019-08-20 ENCOUNTER — Encounter: Payer: Self-pay | Admitting: Orthopaedic Surgery

## 2019-08-20 VITALS — Ht 64.0 in

## 2019-08-20 DIAGNOSIS — S43431A Superior glenoid labrum lesion of right shoulder, initial encounter: Secondary | ICD-10-CM

## 2019-08-20 NOTE — Progress Notes (Signed)
Office Visit Note   Patient: Jimmy [name]           Date of Birth: [birth year]           MRN: [id number] Visit Date: 08/20/2019              Requested by: Gregor Hams, MD [place],  [place] [id number] PCP: Charlott Rakes, MD   Assessment & Plan: Visit Diagnoses:  1. Glenoid labral tear, right, initial encounter     Plan: His MRI shows a posterior labral tear.  He has a type II acromion as well.  These findings were reviewed with the patient in detail and I recommended a glenohumeral cortisone injection which he refused.  He is not interested in doing any more PT.  Based on our discussion he has elected to proceed with right shoulder arthroscopy with posterior labral repair versus debridement and subacromial decompression and extensive debridement.  Risks of surgery including infection, neurovascular injury, incomplete pain relief, DVT discussed with the patient in detail.  Rehab and recovery were also reviewed.  Anticipate that the patient will be out of work for up to 12 weeks.  He would like to have this surgery scheduled in the near future.  Follow-Up Instructions: Return if symptoms worsen or fail to improve.   Orders:  No orders of the defined types were placed in this encounter.  No orders of the defined types were placed in this encounter.     Procedures: No procedures performed   Clinical Data: No additional findings.   Subjective: Chief Complaint  Patient presents with  . 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  He denies any definite trauma or injuries.  He feels that the shoulder has gotten worse over the years.  He has constant superior and posterior pain and he feels popping and clunking.  He feels like the  shoulder feels weak and occasionally feels numbness and tingling down the arm.  He does have nerve conduction studies scheduled for June 10.  He denies any radicular symptoms.  He states that he has [name] extensive physical therapy for the right shoulder without any significant relief.   Review of Systems  Constitutional: Negative.   All other systems reviewed and are negative.    Objective: Vital Signs: Ht 5\' 4"  (1.626 m)   BMI 28.49 kg/m   Physical Exam Vitals and nursing note reviewed.  Constitutional:      Appearance: He is well-developed.  HENT:     Head: Normocephalic and atraumatic.  Eyes:     Pupils: Pupils are equal, round, and reactive to light.  Pulmonary:     Effort: Pulmonary effort is normal.  Abdominal:     Palpations: Abdomen is soft.  Musculoskeletal:        General: Normal range of motion.     Cervical back: Neck supple.  Skin:    General: Skin is warm.  Neurological:     Mental Status: He is alert and oriented to person, place, and time.  Psychiatric:        Behavior: Behavior normal.        Thought Content: Thought content normal.        Judgment: Judgment normal.  Ortho Exam Right shoulder shows tenderness over the biceps tendon.  He is also tender laterally and posteriorly.  Negative posterior apprehension.  Equivocal jerk and Kim test with pain.  He states he has pain with pretty much any provocative maneuver that I attempt.  Manual muscle testing as slightly weaker secondary to pain versus self-limiting behavior Specialty Comments:  No specialty comments available.  Imaging: No results found.   PMFS History: Patient Active Problem List   Diagnosis Date Noted  . Chronic right shoulder pain 08/09/2019  . Incisional hernia, without obstruction or gangrene 05/20/2018  . Opiate dependence ([place]) 08/02/2013   Past Medical History:  Diagnosis Date  . ADHD (attention deficit hyperactivity disorder)   . Anxiety   . Arthritis   . Cancer  ([place])    skin cancer  . Chest pain 08/2018  . GERD (gastroesophageal reflux disease)    no meds  . Migraine    migraines due to pinched nerve  . Pinched nerve in neck   . Pinched nerve in neck   . Scoliosis     Family History  Problem Relation Age of Onset  . Diabetes Mother   . Diabetes Maternal Grandmother   . Cancer Maternal Grandmother        unknown type. doing chemo  . Cancer Maternal Grandfather   . Diabetes Maternal Aunt     Past Surgical History:  Procedure Laterality Date  . ESOPHAGUS SURGERY     as a baby  . VENTRAL HERNIA REPAIR N/A 08/31/2018   Procedure: HERNIA REPAIR VENTRAL ADULT;  Surgeon: Olean Ree, MD;  Location: [facility] ORS;  Service: General;  Laterality: N/A;   Social History   Occupational History  . Not on file  Tobacco Use  . Smoking status: Current Every Day Smoker    Packs/day: 1.00    Years: 15.00    Pack years: 15.00    Types: Cigarettes  . Smokeless tobacco: Current User  Substance and Sexual Activity  . Alcohol use: Yes    Comment: occ  . Drug use: Not Currently    Types: Cocaine, Marijuana  . Sexual activity: Yes

## 2019-08-23 ENCOUNTER — Telehealth: Payer: Self-pay | Admitting: Orthopaedic Surgery

## 2019-08-23 NOTE — Telephone Encounter (Signed)
Please advise. Last office note discussed surgery scheduling.

## 2019-08-23 NOTE — Progress Notes (Signed)
NEUROLOGY FOLLOW UP OFFICE NOTE  SIAH STEELY [id number]  HISTORY OF PRESENT ILLNESS: Namon Villarin is a 29 year old male with Bipolar disorder who follows up for migraines.  UPDATE: Prescribed him nortriptyline in February.  However, he stopped because it caused a bad taste in his mouth.   Last severe headache occurred in May, lasting 2 weeks.    Has been [facility] by [facility] Sn[facility] (not reviewed).  MRI of cervical spine on 07/10/2019 personally reviewed showed right eccentric disc bulge at C3-4 causing mild canal and right C4 foraminal stenosis, as well as mild right C3 and C5 foraminal stenosis.  MRI of right shoulder showed posteroinferior labral tear with intact rotator cuff.  Scheduled for surgery next Wednesday.    Current NSAIDS:  Unable to take due to GI ulcers Current analgesics:  none Current triptans:  rizatriptan 10mg   Current ergotamine:  none Current anti-emetic:  none Current muscle relaxants:  Robaxin 750mg  Current anti-anxiolytic:  none Current sleep aide:  none Current Antihypertensive medications:  none Current Antidepressant/antipsychotic medications:  Nortriptyline 30mg  at bedtime; Seroquel 100mg  at bedtime Current Anticonvulsant medications:  gabapentin 300mg  TID Current anti-CGRP:  none Current Vitamins/Herbal/Supplements:  none Current Antihistamines/Decongestants:  none Other therapy:  none Hormone/birth control:  none  Caffeine:  1 cup of coffee daily; no soda Alcohol:  no Smoker:  1 ppd Diet:  No soda.  8 bottles of water daily.  Exercise:  No routine exercise Depression:  yes; Anxiety:  yes Other pain:  Neck and shoulder pain Sleep:  Good with quetiapine  HISTORY: He has history of headaches since 29 years old.  They are left or right-sided pounding/pressure/stabbing pain associated with photophobia, phonophobia but not nausea, vomiting, visual disturbance, paresthesias or unilateral numbness or weakness.  He reports that they  are triggered by a "pinched nerve in my neck".  He has associated bilateral neck and shoulder pain.  Cervical X-ray from 05/26/2013 personally reviewed did show mild disc space narrowing at C5-6 but otherwise unremarkable.  No radicular pain, numbness or weakness in the upper extremities.  They are typically persistent, lasting weeks to months.  They may be severe for 2 week periods.  It is usually 4-5/10 intensity but 8/10 when severe.  3 to 4 months he is headache-free between episodes.   Nothing relieves the headache.   01/08/2011 CT BRAIN WO:  No acute intracranial abnormality   Past NSAIDS:  Unable to take due to GI ulcers Past analgesics:  Tylenol Past abortive triptans:  Sumatriptan 50mg  Past abortive ergotamine:  none Past muscle relaxants:  Flexeril Past anti-emetic:  none Past antihypertensive medications:  none Past antidepressant/antipsychotic medications: amitriptyline 10mg  (caused metallic taste);  quetiapine 100mg  at bedtime Past anticonvulsant medications:  topiramate 50mg  twice daily (caused metallic taste) Past anti-CGRP:  none Past vitamins/Herbal/Supplements:  none Past antihistamines/decongestants:  none Other past therapies:  none   Family history of headache:  Mother, aunt, sister PAST MEDICAL HISTORY: Past Medical History:  Diagnosis Date  . ADHD (attention deficit hyperactivity disorder)   . Anxiety   . Arthritis   . Cancer ([place])    skin cancer  . Chest pain 08/2018  . GERD (gastroesophageal reflux disease)    no meds  . Migraine    migraines due to pinched nerve  . Pinched nerve in neck   . Pinched nerve in neck   . Scoliosis     MEDICATIONS: Current Outpatient Medications on File Prior to Visit  Medication  Sig Dispense Refill  . acetaminophen (TYLENOL) 500 MG tablet Take 1 tablet (500 mg total) by mouth every 6 (six) hours as needed. 30 tablet 0  . fluticasone (FLONASE) 50 MCG/ACT nasal spray Place 2 sprays into both nostrils daily. 16 g 1  .  gabapentin (NEURONTIN) 300 MG capsule TAKE 1 CAPSULE BY MOUTH AT BEDTIME FOR 7 DAYS 1 CAPSULE TWICE DAILY FOR 7 DAYS 1 CAPSULE THREE TIMES DAILY MAY DOUBLE WEEKLY TO A MAX OF 360    . methocarbamol (ROBAXIN-750) 750 MG tablet Take 1 tablet (750 mg total) by mouth every 8 (eight) hours as needed for muscle spasms. 90 tablet 1  . nicotine (NICODERM CQ) 14 mg/24hr patch Place 1 patch (14 mg total) onto the skin daily. 28 patch 1  . omeprazole (PRILOSEC) 40 MG capsule Take 40 mg by mouth 2 (two) times daily.    . predniSONE (DELTASONE) 50 MG tablet Take 1 tablet (50 mg total) by mouth daily. 5 tablet 0  . pregabalin (LYRICA) 75 MG capsule Take 1 capsule (75 mg total) by mouth 2 (two) times daily. For nerve pain. Replaces gabapentin 60 capsule 1  . QUEtiapine (SEROQUEL) 100 MG tablet Take 1 tablet (100 mg total) by mouth at bedtime. 30 tablet 6  . QUEtiapine (SEROQUEL) 100 MG tablet Take by mouth.    . rizatriptan (MAXALT) 10 MG tablet Take 1 tablet earliest onset of headache.  May repeat in 2 hours if needed.  Maximum 2 tablets in 24 hours. 10 tablet 3  . rizatriptan (MAXALT) 10 MG tablet Take 1 tablet earliest onset of headache.  May repeat in 2 hours if needed.  Maximum 2 tablets in 24 hours.    . [DISCONTINUED] famotidine (PEPCID) 40 MG tablet Take 1 tablet (40 mg total) by mouth daily. 90 tablet 1   No current facility-administered medications on file prior to visit.    ALLERGIES: Allergies  Allergen Reactions  . Cyclobenzaprine Hives and Nausea Only  . Depakote [Divalproex Sodium] Other (See Comments)    "made him crazy"  . Ritalin [Methylphenidate Hcl] Other (See Comments)    Increased hyper activeness   . Naproxen Nausea Only    Acid reflux  . Other Other (See Comments)    [facility] states skin became reddened and he tasted metal (08-2018)  . Tramadol Other (See Comments)    Acid reflux    FAMILY HISTORY: Family History  Problem Relation Age of Onset  . Diabetes Mother   .  Diabetes Maternal Grandmother   . Cancer Maternal Grandmother        unknown type. doing chemo  . Cancer Maternal Grandfather   . Diabetes Maternal Aunt     SOCIAL HISTORY: Social History   Socioeconomic History  . Marital status: Married    Spouse name: Not on file  . Number of children: Not on file  . Years of education: Not on file  . Highest education level: Not on file  Occupational History  . Not on file  Tobacco Use  . Smoking status: Current Every Day Smoker    Packs/day: 1.00    Years: 15.00    Pack years: 15.00    Types: Cigarettes  . Smokeless tobacco: Current User  Substance and Sexual Activity  . Alcohol use: Yes    Comment: occ  . Drug use: Not Currently    Types: Cocaine, Marijuana  . Sexual activity: Yes  Other Topics Concern  . Not on file  Social History Narrative  Right handed   One story home    Drinks caffeine    Social Determinants of Health   Financial Resource Strain:   . Difficulty of Paying Living Expenses:   Food Insecurity:   . Worried About Charity fundraiser in the Last Year:   . Arboriculturist in the Last Year:   Transportation Needs:   . Film/video editor (Medical):   Marland Kitchen Lack of Transportation (Non-Medical):   Physical Activity:   . Days of Exercise per Week:   . Minutes of Exercise per Session:   Stress:   . Feeling of Stress :   Social Connections:   . Frequency of Communication with Friends and Family:   . Frequency of Social Gatherings with Friends and Family:   . Attends Religious Services:   . Active Member of Clubs or Organizations:   . Attends Archivist Meetings:   Marland Kitchen Marital Status:   Intimate Partner Violence:   . Fear of Current or Ex-Partner:   . Emotionally Abused:   Marland Kitchen Physically Abused:   . Sexually Abused:     PHYSICAL EXAM: Blood pressure 131/74, pulse 71, height 5\' 4"  (1.626 m), weight 168 lb 12.8 oz (76.6 kg), SpO2 99 %. General: No acute distress.  Patient appears well-groomed.     Head:  Normocephalic/atraumatic Eyes:  Fundi examined but not visualized Neck: supple, no paraspinal tenderness, full range of motion Heart:  Regular rate and rhythm Lungs:  Clear to auscultation bilaterally Back: No paraspinal tenderness Neurological Exam: alert and oriented to person, place, and time. Attention span and concentration intact, recent and remote memory intact, fund of knowledge intact.  Speech fluent and not dysarthric, language intact.  CN II-XII intact. Bulk and tone normal, muscle strength 5/5 throughout.  Sensation to light touch, temperature and vibration intact.  Deep tendon reflexes 2+ throughout, toes downgoing.  Finger to nose and heel to shin testing intact.  Gait normal, Romberg negative.  IMPRESSION: 1.  migraine without aura, with status migrainosus, not intractable 2.  Cervicalgia  PLAN: 1.  For preventative management, start Aimovig 70mg  every 28 days 2.  For abortive therapy, he will try Ubrelvy 100mg  3.  Limit use of pain relievers to no more than 2 days out of week to prevent risk of rebound or medication-overuse headache. 4.  Keep headache diary 5.  Follow up in 4 months.   Metta Clines, DO  CC:  Charlott Rakes, MD  Lynne Leader, MD

## 2019-08-23 NOTE — Telephone Encounter (Signed)
Patient's finance called stating that when the patient woke up yesterday, his shoulder is higher than the other one and is in a lot pain.  Her DB#[phone].  Thank you.

## 2019-08-23 NOTE — Telephone Encounter (Signed)
The shoulder being higher sounds like something muscular.

## 2019-08-23 NOTE — Telephone Encounter (Signed)
I called to advise. No answer.

## 2019-08-24 ENCOUNTER — Other Ambulatory Visit: Payer: Self-pay | Admitting: Family

## 2019-08-24 ENCOUNTER — Encounter (HOSPITAL_BASED_OUTPATIENT_CLINIC_OR_DEPARTMENT_OTHER): Payer: Self-pay | Admitting: Orthopaedic Surgery

## 2019-08-24 ENCOUNTER — Other Ambulatory Visit: Payer: Self-pay

## 2019-08-25 ENCOUNTER — Other Ambulatory Visit: Payer: Self-pay

## 2019-08-25 ENCOUNTER — Encounter: Payer: Self-pay | Admitting: Neurology

## 2019-08-25 ENCOUNTER — Ambulatory Visit (INDEPENDENT_AMBULATORY_CARE_PROVIDER_SITE_OTHER): Payer: No Typology Code available for payment source | Admitting: Neurology

## 2019-08-25 VITALS — BP 131/74 | HR 71 | Ht 64.0 in | Wt 168.8 lb

## 2019-08-25 DIAGNOSIS — G43011 Migraine without aura, intractable, with status migrainosus: Secondary | ICD-10-CM | POA: Diagnosis not present

## 2019-08-25 MED ORDER — AIMOVIG 70 MG/ML ~~LOC~~ SOAJ: 70.0000 mg | SUBCUTANEOUS | 11 refills | Status: DC

## 2019-08-25 NOTE — Patient Instructions (Signed)
1.  Start Aimovig 70mg  injection every 28 days.  Keep refrigerated and take out 30 minutes prior to injection 2.  When you get a migraine, take Ubrelvy 100mg .  May repeat dose in 2 hours if needed (maximum 2 tablets in 24 hours). If effective, contact me and I will send prescription 3.  Follow up in 4 months.

## 2019-08-25 NOTE — Progress Notes (Addendum)
Jimmy [name] (Key: [id number]) Rx #: [id number] Aimovig 70MG /ML auto-injectors  Denied.  Faxed appeal form to [phone].  Per [facility] fax member's prescription benefit coverage indicates PA is not required.

## 2019-08-26 ENCOUNTER — Ambulatory Visit (INDEPENDENT_AMBULATORY_CARE_PROVIDER_SITE_OTHER): Payer: No Typology Code available for payment source | Admitting: Diagnostic Neuroimaging

## 2019-08-26 ENCOUNTER — Encounter (INDEPENDENT_AMBULATORY_CARE_PROVIDER_SITE_OTHER): Payer: No Typology Code available for payment source | Admitting: Diagnostic Neuroimaging

## 2019-08-26 ENCOUNTER — Encounter: Payer: Self-pay | Admitting: Diagnostic Neuroimaging

## 2019-08-26 DIAGNOSIS — M5412 Radiculopathy, cervical region: Secondary | ICD-10-CM

## 2019-08-26 DIAGNOSIS — R200 Anesthesia of skin: Secondary | ICD-10-CM

## 2019-08-26 DIAGNOSIS — Z0289 Encounter for other administrative examinations: Secondary | ICD-10-CM

## 2019-08-27 ENCOUNTER — Other Ambulatory Visit: Payer: Self-pay

## 2019-08-30 ENCOUNTER — Other Ambulatory Visit (HOSPITAL_COMMUNITY)
Admission: RE | Admit: 2019-08-30 | Discharge: 2019-08-30 | Disposition: A | Discharge status: Home / Self Care | Payer: No Typology Code available for payment source | Source: Ambulatory Visit | Attending: Orthopaedic Surgery | Admitting: Orthopaedic Surgery

## 2019-08-30 DIAGNOSIS — Z20822 Contact with and (suspected) exposure to covid-19: Secondary | ICD-10-CM | POA: Diagnosis not present

## 2019-08-30 DIAGNOSIS — Z01812 Encounter for preprocedural laboratory examination: Secondary | ICD-10-CM | POA: Insufficient documentation

## 2019-08-30 LAB — SARS CORONAVIRUS 2 (TAT 6-24 HRS): SARS Coronavirus 2: NEGATIVE

## 2019-08-30 NOTE — Progress Notes (Signed)

## 2019-08-31 DIAGNOSIS — M7551 Bursitis of right shoulder: Secondary | ICD-10-CM

## 2019-09-01 ENCOUNTER — Other Ambulatory Visit: Payer: Self-pay | Admitting: Orthopaedic Surgery

## 2019-09-01 ENCOUNTER — Encounter: Payer: Self-pay | Admitting: Orthopaedic Surgery

## 2019-09-01 ENCOUNTER — Ambulatory Visit (HOSPITAL_BASED_OUTPATIENT_CLINIC_OR_DEPARTMENT_OTHER): Payer: No Typology Code available for payment source | Admitting: Anesthesiology

## 2019-09-01 ENCOUNTER — Ambulatory Visit (HOSPITAL_BASED_OUTPATIENT_CLINIC_OR_DEPARTMENT_OTHER)
Admission: RE | Admit: 2019-09-01 | Discharge: 2019-09-01 | Disposition: A | Discharge status: Home / Self Care | Payer: No Typology Code available for payment source | Attending: Orthopaedic Surgery | Admitting: Orthopaedic Surgery

## 2019-09-01 ENCOUNTER — Encounter (HOSPITAL_BASED_OUTPATIENT_CLINIC_OR_DEPARTMENT_OTHER)
Admission: RE | Disposition: A | Discharge status: Home / Self Care | Payer: Self-pay | Source: Home / Self Care | Attending: Orthopaedic Surgery

## 2019-09-01 ENCOUNTER — Encounter (HOSPITAL_BASED_OUTPATIENT_CLINIC_OR_DEPARTMENT_OTHER): Payer: Self-pay | Admitting: Orthopaedic Surgery

## 2019-09-01 ENCOUNTER — Other Ambulatory Visit: Payer: Self-pay

## 2019-09-01 DIAGNOSIS — M25511 Pain in right shoulder: Secondary | ICD-10-CM | POA: Diagnosis not present

## 2019-09-01 DIAGNOSIS — M419 Scoliosis, unspecified: Secondary | ICD-10-CM | POA: Diagnosis not present

## 2019-09-01 DIAGNOSIS — K219 Gastro-esophageal reflux disease without esophagitis: Secondary | ICD-10-CM | POA: Insufficient documentation

## 2019-09-01 DIAGNOSIS — M1990 Unspecified osteoarthritis, unspecified site: Secondary | ICD-10-CM | POA: Diagnosis not present

## 2019-09-01 DIAGNOSIS — G43909 Migraine, unspecified, not intractable, without status migrainosus: Secondary | ICD-10-CM | POA: Diagnosis not present

## 2019-09-01 DIAGNOSIS — S43431A Superior glenoid labrum lesion of right shoulder, initial encounter: Secondary | ICD-10-CM | POA: Diagnosis not present

## 2019-09-01 DIAGNOSIS — Z79899 Other long term (current) drug therapy: Secondary | ICD-10-CM | POA: Diagnosis not present

## 2019-09-01 DIAGNOSIS — Z85828 Personal history of other malignant neoplasm of skin: Secondary | ICD-10-CM | POA: Diagnosis not present

## 2019-09-01 DIAGNOSIS — F17210 Nicotine dependence, cigarettes, uncomplicated: Secondary | ICD-10-CM | POA: Diagnosis not present

## 2019-09-01 DIAGNOSIS — M25811 Other specified joint disorders, right shoulder: Secondary | ICD-10-CM | POA: Insufficient documentation

## 2019-09-01 DIAGNOSIS — F909 Attention-deficit hyperactivity disorder, unspecified type: Secondary | ICD-10-CM | POA: Diagnosis not present

## 2019-09-01 DIAGNOSIS — Z7952 Long term (current) use of systemic steroids: Secondary | ICD-10-CM | POA: Diagnosis not present

## 2019-09-01 DIAGNOSIS — F419 Anxiety disorder, unspecified: Secondary | ICD-10-CM | POA: Diagnosis not present

## 2019-09-01 DIAGNOSIS — M7551 Bursitis of right shoulder: Secondary | ICD-10-CM

## 2019-09-01 HISTORY — PX: SHOULDER ARTHROSCOPY WITH SUBACROMIAL DECOMPRESSION: SHX5684

## 2019-09-01 SURGERY — SHOULDER ARTHROSCOPY WITH SUBACROMIAL DECOMPRESSION
Anesthesia: General | Site: Shoulder | Laterality: Right

## 2019-09-01 MED ORDER — FENTANYL CITRATE (PF) 100 MCG/2ML IJ SOLN
100.0000 ug | Freq: Once | INTRAMUSCULAR | Status: AC
  Administered 2019-09-01: 100 ug via INTRAVENOUS

## 2019-09-01 MED ORDER — BUPIVACAINE HCL (PF) 0.5 % IJ SOLN
INTRAMUSCULAR | Status: DC | PRN
  Administered 2019-09-01: 20 mL via PERINEURAL

## 2019-09-01 MED ORDER — ONDANSETRON HCL 4 MG/2ML IJ SOLN
INTRAMUSCULAR | Status: AC
  Filled 2019-09-01: qty 2

## 2019-09-01 MED ORDER — MIDAZOLAM HCL 2 MG/2ML IJ SOLN
INTRAMUSCULAR | Status: AC
  Filled 2019-09-01: qty 2

## 2019-09-01 MED ORDER — CEFAZOLIN SODIUM-DEXTROSE 2-4 GM/100ML-% IV SOLN
2.0000 g | INTRAVENOUS | Status: AC
  Administered 2019-09-01: 2 g via INTRAVENOUS

## 2019-09-01 MED ORDER — SODIUM CHLORIDE 0.9 % IR SOLN
Status: DC | PRN
  Administered 2019-09-01: 5000 mL

## 2019-09-01 MED ORDER — OXYCODONE HCL 5 MG PO TABS: 5.0000 mg | ORAL_TABLET | Freq: Once | ORAL | Status: DC | PRN

## 2019-09-01 MED ORDER — OXYCODONE-ACETAMINOPHEN 5-325 MG PO TABS: 1.0000 | ORAL_TABLET | Freq: Two times a day (BID) | ORAL | 0 refills | Status: DC | PRN

## 2019-09-01 MED ORDER — LIDOCAINE 2% (20 MG/ML) 5 ML SYRINGE
INTRAMUSCULAR | Status: AC
  Filled 2019-09-01: qty 5

## 2019-09-01 MED ORDER — DEXAMETHASONE SODIUM PHOSPHATE 10 MG/ML IJ SOLN
INTRAMUSCULAR | Status: DC | PRN
  Administered 2019-09-01: 10 mg via INTRAVENOUS

## 2019-09-01 MED ORDER — CHLORHEXIDINE GLUCONATE 4 % EX LIQD: Freq: Once | CUTANEOUS | Status: DC

## 2019-09-01 MED ORDER — PROMETHAZINE HCL 25 MG/ML IJ SOLN: 6.2500 mg | INTRAMUSCULAR | Status: DC | PRN

## 2019-09-01 MED ORDER — ONDANSETRON HCL 4 MG/2ML IJ SOLN
INTRAMUSCULAR | Status: DC | PRN
  Administered 2019-09-01: 4 mg via INTRAVENOUS

## 2019-09-01 MED ORDER — FENTANYL CITRATE (PF) 250 MCG/5ML IJ SOLN
INTRAMUSCULAR | Status: DC | PRN
  Administered 2019-09-01 (×3): 50 ug via INTRAVENOUS

## 2019-09-01 MED ORDER — FENTANYL CITRATE (PF) 100 MCG/2ML IJ SOLN
INTRAMUSCULAR | Status: AC
  Filled 2019-09-01: qty 2

## 2019-09-01 MED ORDER — CEFAZOLIN SODIUM-DEXTROSE 2-4 GM/100ML-% IV SOLN
INTRAVENOUS | Status: AC
  Filled 2019-09-01: qty 100

## 2019-09-01 MED ORDER — BUPIVACAINE LIPOSOME 1.3 % IJ SUSP
INTRAMUSCULAR | Status: DC | PRN
  Administered 2019-09-01: 10 mL via PERINEURAL

## 2019-09-01 MED ORDER — LACTATED RINGERS IV SOLN: INTRAVENOUS | Status: DC

## 2019-09-01 MED ORDER — OXYCODONE HCL 5 MG/5ML PO SOLN: 5.0000 mg | Freq: Once | ORAL | Status: DC | PRN

## 2019-09-01 MED ORDER — DEXAMETHASONE SODIUM PHOSPHATE 10 MG/ML IJ SOLN
INTRAMUSCULAR | Status: AC
  Filled 2019-09-01: qty 1

## 2019-09-01 MED ORDER — PROPOFOL 10 MG/ML IV BOLUS
INTRAVENOUS | Status: DC | PRN
  Administered 2019-09-01: 200 mg via INTRAVENOUS

## 2019-09-01 MED ORDER — ONDANSETRON HCL 4 MG PO TABS: 4.0000 mg | ORAL_TABLET | Freq: Three times a day (TID) | ORAL | 0 refills | Status: DC | PRN

## 2019-09-01 MED ORDER — MEPERIDINE HCL 25 MG/ML IJ SOLN: 6.2500 mg | INTRAMUSCULAR | Status: DC | PRN

## 2019-09-01 MED ORDER — MIDAZOLAM HCL 2 MG/2ML IJ SOLN
2.0000 mg | Freq: Once | INTRAMUSCULAR | Status: AC
  Administered 2019-09-01: 2 mg via INTRAVENOUS

## 2019-09-01 MED ORDER — HYDROMORPHONE HCL 1 MG/ML IJ SOLN: 0.2500 mg | INTRAMUSCULAR | Status: DC | PRN

## 2019-09-01 MED ORDER — LIDOCAINE 2% (20 MG/ML) 5 ML SYRINGE
INTRAMUSCULAR | Status: DC | PRN
  Administered 2019-09-01: 80 mg via INTRAVENOUS

## 2019-09-01 MED ORDER — ROPIVACAINE HCL 5 MG/ML IJ SOLN
INTRAMUSCULAR | Status: DC | PRN
Start: 2019-09-01 — End: 2019-09-01

## 2019-09-01 SURGICAL SUPPLY — 59 items
APL SKNCLS STERI-STRIP NONHPOA (GAUZE/BANDAGES/DRESSINGS)
BENZOIN TINCTURE PRP APPL 2/3 (GAUZE/BANDAGES/DRESSINGS) IMPLANT
BLADE EXCALIBUR 4.0X13 (MISCELLANEOUS) ×1 IMPLANT
BLADE SURG 15 STRL LF DISP TIS (BLADE) IMPLANT
BLADE SURG 15 STRL SS (BLADE)
BURR OVAL 8 FLU 4.0X13 (MISCELLANEOUS) ×1 IMPLANT
CANNULA 5.75X71 LONG (CANNULA) ×2 IMPLANT
CANNULA TWIST IN 8.25X7CM (CANNULA) IMPLANT
CLSR STERI-STRIP ANTIMIC 1/2X4 (GAUZE/BANDAGES/DRESSINGS) IMPLANT
COVER WAND RF STERILE (DRAPES) IMPLANT
DECANTER SPIKE VIAL GLASS SM (MISCELLANEOUS) IMPLANT
DISSECTOR  3.8MM X 13CM (MISCELLANEOUS) ×2
DISSECTOR 3.8MM X 13CM (MISCELLANEOUS) IMPLANT
DRAPE IMP U-DRAPE 54X76 (DRAPES) ×2 IMPLANT
DRAPE INCISE IOBAN 66X45 STRL (DRAPES) ×2 IMPLANT
DRAPE STERI 35X30 U-POUCH (DRAPES) ×2 IMPLANT
DRAPE SURG 17X23 STRL (DRAPES) ×2 IMPLANT
DRAPE U-SHAPE 47X51 STRL (DRAPES) ×4 IMPLANT
DRAPE U-SHAPE 76X120 STRL (DRAPES) ×4 IMPLANT
DRSG PAD ABDOMINAL 8X10 ST (GAUZE/BANDAGES/DRESSINGS) ×2 IMPLANT
DURAPREP 26ML APPLICATOR (WOUND CARE) ×4 IMPLANT
DW OUTFLOW CASSETTE/TUBE SET (MISCELLANEOUS) IMPLANT
ELECT REM PT RETURN 9FT ADLT (ELECTROSURGICAL) ×2
ELECTRODE REM PT RTRN 9FT ADLT (ELECTROSURGICAL) IMPLANT
GAUZE SPONGE 4X4 12PLY STRL (GAUZE/BANDAGES/DRESSINGS) ×2 IMPLANT
GAUZE XEROFORM 1X8 LF (GAUZE/BANDAGES/DRESSINGS) ×2 IMPLANT
GLOVE BIOGEL PI IND STRL 7.0 (GLOVE) ×1 IMPLANT
GLOVE BIOGEL PI INDICATOR 7.0 (GLOVE) ×2
GLOVE ECLIPSE 6.5 STRL STRAW (GLOVE) ×1 IMPLANT
GLOVE ECLIPSE 7.0 STRL STRAW (GLOVE) ×1 IMPLANT
GLOVE SKINSENSE NS SZ7.5 (GLOVE)
GLOVE SKINSENSE STRL SZ7.5 (GLOVE) ×1 IMPLANT
GLOVE SURG SYN 7.5  E (GLOVE) ×2
GLOVE SURG SYN 7.5 E (GLOVE) ×1 IMPLANT
GLOVE SURG SYN 7.5 PF PI (GLOVE) ×1 IMPLANT
GOWN STRL REUS W/ TWL LRG LVL3 (GOWN DISPOSABLE) ×1 IMPLANT
GOWN STRL REUS W/ TWL XL LVL3 (GOWN DISPOSABLE) ×1 IMPLANT
GOWN STRL REUS W/TWL LRG LVL3 (GOWN DISPOSABLE) ×2
GOWN STRL REUS W/TWL XL LVL3 (GOWN DISPOSABLE)
KIT PUSHLOCK 2.9 HIP (KITS) IMPLANT
LASSO 90 CVE QUICKPAS (DISPOSABLE) IMPLANT
LASSO CRESCENT QUICKPASS (SUTURE) IMPLANT
MANIFOLD NEPTUNE II (INSTRUMENTS) ×2 IMPLANT
PACK DSU ARTHROSCOPY (CUSTOM PROCEDURE TRAY) ×2 IMPLANT
PORT APPOLLO RF 90DEGREE MULTI (SURGICAL WAND) ×2 IMPLANT
SET BASIN DAY SURGERY F.S. (CUSTOM PROCEDURE TRAY) ×2 IMPLANT
SHEET MEDIUM DRAPE 40X70 STRL (DRAPES) ×2 IMPLANT
SLEEVE SCD COMPRESS KNEE MED (MISCELLANEOUS) ×2 IMPLANT
SLING ARM FOAM STRAP LRG (SOFTGOODS) ×1 IMPLANT
SUT ETHILON 3 0 PS 1 (SUTURE) ×2 IMPLANT
SUT FIBERWIRE #2 38 T-5 BLUE (SUTURE)
SUTURE FIBERWR #2 38 T-5 BLUE (SUTURE) IMPLANT
SYR 50ML LL SCALE MARK (SYRINGE) IMPLANT
TAPE LABRALWHITE 1.5X36 (TAPE) IMPLANT
TAPE SUT LABRALTAP WHT/BLK (SUTURE) IMPLANT
TOWEL GREEN STERILE FF (TOWEL DISPOSABLE) ×2 IMPLANT
TUBE CONNECTING 20X1/4 (TUBING) ×1 IMPLANT
TUBING ARTHROSCOPY IRRIG 16FT (MISCELLANEOUS) IMPLANT
WATER STERILE IRR 1000ML POUR (IV SOLUTION) ×2 IMPLANT

## 2019-09-01 NOTE — H&P (Signed)
PREOPERATIVE H&P  Chief Complaint: right posterior labral tear  HPI: Jimmy [name] is a 29 y.o. male who presents for surgical treatment of right posterior labral tear.  He denies any changes in medical history.  Past Medical History:  Diagnosis Date  . ADHD (attention deficit hyperactivity disorder)   . Anxiety   . Arthritis   . Cancer ([place])    skin cancer  . Chest pain 08/2018  . GERD (gastroesophageal reflux disease)    no meds  . Migraine    migraines due to pinched nerve  . Pinched nerve in neck   . Pinched nerve in neck   . Scoliosis    Past Surgical History:  Procedure Laterality Date  . ESOPHAGUS SURGERY     as a baby  . VENTRAL HERNIA REPAIR N/A 08/31/2018   Procedure: HERNIA REPAIR VENTRAL ADULT;  Surgeon: Olean Ree, MD;  Location: [facility] ORS;  Service: General;  Laterality: N/A;   Social History   Socioeconomic History  . Marital status: Married    Spouse name: Not on file  . Number of children: Not on file  . Years of education: Not on file  . Highest education level: Not on file  Occupational History  . Not on file  Tobacco Use  . Smoking status: Current Every Day Smoker    Packs/day: 1.00    Years: 15.00    Pack years: 15.00    Types: Cigarettes  . Smokeless tobacco: Current User  Vaping Use  . Vaping Use: Former  . Substances: Nicotine  Substance and Sexual Activity  . Alcohol use: Not Currently  . Drug use: Not Currently    Types: Cocaine, Marijuana  . Sexual activity: Yes  Other Topics Concern  . Not on file  Social History Narrative   Right handed   One story home    Drinks caffeine    Social Determinants of Health   Financial Resource Strain:   . Difficulty of Paying Living Expenses:   Food Insecurity:   . Worried About Charity fundraiser in the Last Year:   . Arboriculturist in the Last Year:   Transportation Needs:   . Film/video editor (Medical):   Marland Kitchen Lack of Transportation (Non-Medical):   Physical Activity:    . Days of Exercise per Week:   . Minutes of Exercise per Session:   Stress:   . Feeling of Stress :   Social Connections:   . Frequency of Communication with Friends and Family:   . Frequency of Social Gatherings with Friends and Family:   . Attends Religious Services:   . Active Member of Clubs or Organizations:   . Attends Archivist Meetings:   Marland Kitchen Marital Status:    Family History  Problem Relation Age of Onset  . Diabetes Mother   . Diabetes Maternal Grandmother   . Cancer Maternal Grandmother        unknown type. doing chemo  . Cancer Maternal Grandfather   . Diabetes Maternal Aunt    Allergies  Allergen Reactions  . Cyclobenzaprine Hives and Nausea Only  . Depakote [Divalproex Sodium] Other (See Comments)    "made him crazy"  . Ritalin [Methylphenidate Hcl] Other (See Comments)    Increased hyper activeness   . Naproxen Nausea Only    Acid reflux  . Other Other (See Comments)    [facility] states skin became reddened and he tasted metal (08-2018)  . Tramadol Other (See Comments)  Acid reflux   Prior to Admission medications   Medication Sig Start Date End Date Taking? Authorizing Provider  gabapentin (NEURONTIN) 300 MG capsule TAKE 1 CAPSULE BY MOUTH AT BEDTIME FOR 7 DAYS 1 CAPSULE TWICE DAILY FOR 7 DAYS 1 CAPSULE THREE TIMES DAILY MAY DOUBLE WEEKLY TO A MAX OF 360 06/30/19  Yes [provider]  methocarbamol (ROBAXIN-750) 750 MG tablet Take 1 tablet (750 mg total) by mouth every 8 (eight) hours as needed for muscle spasms. 07/26/19  Yes Charlott Rakes, MD  omeprazole (PRILOSEC) 40 MG capsule Take 40 mg by mouth 2 (two) times daily. 10/08/18 10/08/19 Yes [provider]  predniSONE (DELTASONE) 50 MG tablet Take 1 tablet (50 mg total) by mouth daily. Patient not taking: Reported on 08/25/2019 06/25/19  Yes Gregor Hams, MD  QUEtiapine (SEROQUEL) 100 MG tablet Take 1 tablet (100 mg total) by mouth at bedtime. 07/26/19  Yes Charlott Rakes, MD   acetaminophen (TYLENOL) 500 MG tablet Take 1 tablet (500 mg total) by mouth every 6 (six) hours as needed. Patient not taking: Reported on 08/25/2019 02/22/19   Wurst, [place], PA-C  Erenumab-aooe (AIMOVIG) 70 MG/ML SOAJ Inject 70 mg into the skin every 28 (twenty-eight) days. 08/25/19   Tomi Likens, Adam R, DO  fluticasone (FLONASE) 50 MCG/ACT nasal spray Place 2 sprays into both nostrils daily. 07/26/19   Charlott Rakes, MD  nicotine (NICODERM CQ) 14 mg/24hr patch Place 1 patch (14 mg total) onto the skin daily. Patient not taking: Reported on 08/25/2019 04/26/19   Charlott Rakes, MD  pregabalin (LYRICA) 75 MG capsule Take 1 capsule (75 mg total) by mouth 2 (two) times daily. For nerve pain. Replaces gabapentin Patient not taking: Reported on 08/25/2019 07/01/19   Gregor Hams, MD  rizatriptan (MAXALT) 10 MG tablet Take 1 tablet earliest onset of headache.  May repeat in 2 hours if needed.  Maximum 2 tablets in 24 hours. 04/22/19   Pieter Partridge, DO  rizatriptan (MAXALT) 10 MG tablet Take 1 tablet earliest onset of headache.  May repeat in 2 hours if needed.  Maximum 2 tablets in 24 hours. 04/22/19   [provider]  famotidine (PEPCID) 40 MG tablet Take 1 tablet (40 mg total) by mouth daily. 08/26/18 10/01/18  Robyn Haber, MD     Positive ROS: All other systems have been reviewed and were otherwise negative with the exception of those mentioned in the HPI and as above.  Physical Exam: General: Alert, no acute distress Cardiovascular: No pedal edema Respiratory: No cyanosis, no use of accessory musculature GI: abdomen soft Skin: No lesions in the area of chief complaint Neurologic: Sensation intact distally Psychiatric: Patient is competent for consent with normal mood and affect Lymphatic: no lymphedema  MUSCULOSKELETAL: exam stable  Assessment: right posterior labral tear  Plan: Plan for Procedure(s): RIGHT SHOULDER ARTHROSCOPY WITH EXTENSIVE DEBRIDEMENT, POSTERIOR LABRAL REPAIR,  SUBACROMIAL DECOMPRESSION  The risks benefits and alternatives were discussed with the patient including but not limited to the risks of nonoperative treatment, versus surgical intervention including infection, bleeding, nerve injury,  blood clots, cardiopulmonary complications, morbidity, mortality, among others, and they were willing to proceed.   Preoperative templating of the joint replacement has been completed, documented, and submitted to the Operating Room personnel in order to optimize intra-operative equipment management.   Eduard Roux, MD 09/01/2019 7:25 AM

## 2019-09-01 NOTE — Transfer of Care (Signed)
Immediate Anesthesia Transfer of Care Note  Patient: Jimmy [name]  Procedure(s) Performed: RIGHT SHOULDER ARTHROSCOPY WITH EXTENSIVE DEBRIDEMENT, POSTERIOR LABRAL REPAIR, SUBACROMIAL DECOMPRESSION (Right Shoulder)  Patient Location: PACU  Anesthesia Type:MAC  Level of Consciousness: awake, alert  and oriented  Airway & Oxygen Therapy: Patient Spontanous Breathing and Patient connected to nasal cannula oxygen  Post-op Assessment: Report given to RN and Post -op Vital signs reviewed and stable  Post vital signs: Reviewed and stable  Last Vitals:  Vitals Value Taken Time  BP 131/85 09/01/19 1450  Temp    Pulse 71 09/01/19 1452  Resp 19 09/01/19 1452  SpO2 100 % 09/01/19 1452  Vitals shown include unvalidated device data.  Last Pain:  Vitals:   09/01/19 1202  TempSrc: Oral  PainSc: 8       Patients Stated Pain Goal: 5 (29/92/42 6834)  Complications: No complications documented.

## 2019-09-01 NOTE — Anesthesia Preprocedure Evaluation (Signed)
Anesthesia Evaluation  Patient identified by MRN, date of birth, ID band Patient awake    Reviewed: Allergy & Precautions, NPO status , Patient's Chart, lab work & pertinent test results, reviewed documented beta blocker date and time   Airway Mallampati: II  TM Distance: >3 FB Neck ROM: Full    Dental no notable dental hx. (+) Chipped   Pulmonary Current Smoker and Patient abstained from smoking.,    Pulmonary exam normal breath sounds clear to auscultation       Cardiovascular Normal cardiovascular exam Rhythm:Regular Rate:Normal     Neuro/Psych  Headaches, PSYCHIATRIC DISORDERS Anxiety  Neuromuscular disease    GI/Hepatic GERD  Controlled,  Endo/Other    Renal/GU      Musculoskeletal  (+) Arthritis , Osteoarthritis,    Abdominal   Peds  Hematology   Anesthesia Other Findings ADHD. Hx of opiates and cocaine.  Reproductive/Obstetrics                             Anesthesia Physical  Anesthesia Plan  ASA: II  Anesthesia Plan: General   Post-op Pain Management:  Regional for Post-op pain   Induction: Intravenous  PONV Risk Score and Plan: 1 and Ondansetron and Treatment may vary due to age or medical condition  Airway Management Planned: LMA  Additional Equipment:   Intra-op Plan:   Post-operative Plan: Extubation in OR  Informed Consent: I have reviewed the patients History and Physical, chart, labs and discussed the procedure including the risks, benefits and alternatives for the proposed anesthesia with the patient or authorized representative who has indicated his/her understanding and acceptance.     Dental advisory given  Plan Discussed with: CRNA  Anesthesia Plan Comments:         Anesthesia Quick Evaluation  

## 2019-09-01 NOTE — Anesthesia Procedure Notes (Signed)
Anesthesia Regional Block: Interscalene brachial plexus block   Pre-Anesthetic Checklist: ,, timeout performed, Correct Patient, Correct Site, Correct Laterality, Correct Procedure, Correct Position, site marked, Risks and benefits discussed,  Surgical consent,  Pre-op evaluation,  At surgeon's request and post-op pain management  Laterality: Right  Prep: chloraprep       Needles:  Injection technique: Single-shot  Needle Type: Stimiplex     Needle Length: 9cm  Needle Gauge: 21     Additional Needles:   Procedures:,,,, ultrasound used (permanent image in chart),,,,  Narrative:  Start time: 09/01/2019 12:53 PM End time: 09/01/2019 12:57 PM Injection made incrementally with aspirations every 5 mL.  Performed by: Personally  Anesthesiologist: Lynda Rainwater, MD

## 2019-09-01 NOTE — Anesthesia Postprocedure Evaluation (Signed)
Anesthesia Post Note  Patient: Jimmy [name]  Procedure(s) Performed: RIGHT SHOULDER ARTHROSCOPY WITH EXTENSIVE DEBRIDEMENT AND SUBACROMIAL DECOMPRESSION (Right Shoulder)     Patient location during evaluation: PACU Anesthesia Type: General Level of consciousness: awake and alert Pain management: pain level controlled Vital Signs Assessment: post-procedure vital signs reviewed and stable Respiratory status: spontaneous breathing, nonlabored ventilation and respiratory function stable Cardiovascular status: blood pressure returned to baseline and stable Postop Assessment: no apparent nausea or vomiting Anesthetic complications: no   No complications documented.  Last Vitals:  Vitals:   09/01/19 1500 09/01/19 1515  BP: 116/84 (!) 136/93  Pulse: 76 78  Resp: 19 18  Temp:    SpO2: 100% 100%    Last Pain:  Vitals:   09/01/19 1515  TempSrc:   PainSc: 0-No pain                 Lynda Rainwater

## 2019-09-01 NOTE — Progress Notes (Signed)
Assisted Dr. Miller with right, ultrasound guided, interscalene  block. Side rails up, monitors on throughout procedure. See vital signs in flow sheet. Tolerated Procedure well. 

## 2019-09-01 NOTE — Discharge Instructions (Signed)
Post-operative patient instructions  Shoulder Arthroscopy   . Ice:  Place intermittent ice or cooler pack over your shoulder, 30 minutes on and 30 minutes off.  Continue this for the first 72 hours after surgery, then save ice for use after therapy sessions or on more active days.   . Weight:  You may bear weight on your arm as your symptoms allow. . Motion:  Perform gentle shoulder motion as tolerated . Dressing:  Perform 1st dressing change at 2 days postoperative. A moderate amount of blood tinged drainage is to be expected.  So if you bleed through the dressing on the first or second day or if you have fevers, it is fine to change the dressing/check the wounds early and redress wound.  If it bleeds through again, or if the incisions are leaking frank blood, please call the office. May change dressing every 1-2 days thereafter to help watch wounds. Can purchase Tegaderm (or 2M Nexcare) water resistant dressings at local pharmacy / [facility]. . Shower:  Light shower is ok after 2 days.  Please take shower, NO bath. Recover with gauze and ace wrap to help keep wounds protected.   . Pain medication:  A narcotic pain medication has been prescribed.  Take as directed.  Typically you need narcotic pain medication more regularly during the first 3 to 5 days after surgery.  Decrease your use of the medication as the pain improves.  Narcotics can sometimes cause constipation, even after a few doses.  If you have problems with constipation, you can take an over the counter stool softener or light laxative.  If you have persistent problems, please notify your physician's office. Marland Kitchen Physical therapy: Additional activity guidelines to be provided by your physician or physical therapist at follow-up visits.  . Driving: Do not recommend driving x 2 weeks post surgical, especially if surgery performed on right side. Should not drive while taking narcotic pain medications. It typically takes at least 2 weeks to  restore sufficient neuromuscular function for normal reaction times for driving safety.  . Call [phone] for questions or problems. Evenings you will be forwarded to the hospital operator.  Ask for the orthopaedic physician on call. Please call if you experience:    o Redness, foul smelling, or persistent drainage from the surgical site  o worsening shoulder pain and swelling not responsive to medication  o any calf pain and or swelling of the lower leg  o temperatures greater than 101.5 F o other questions or concerns   Thank you for allowing [place] to be a part of your care.    Post Anesthesia Home Care Instructions  Activity: Get plenty of rest for the remainder of the day. A responsible individual must stay with you for 24 hours following the procedure.  For the next 24 hours, DO NOT: -Drive a car -Paediatric nurse -Drink alcoholic beverages -Take any medication unless instructed by your physician -Make any legal decisions or sign important papers.  Meals: Start with liquid foods such as gelatin or soup. Progress to regular foods as tolerated. Avoid greasy, spicy, heavy foods. If nausea and/or vomiting occur, drink only clear liquids until the nausea and/or vomiting subsides. Call your physician if vomiting continues.  Special Instructions/Symptoms: Your throat may feel dry or sore from the anesthesia or the breathing tube placed in your throat during surgery. If this causes discomfort, gargle with warm salt water. The discomfort should disappear within 24 hours.      Regional Anesthesia  Blocks  1. Numbness or the inability to move the "blocked" extremity may last from 3-48 hours after placement. The length of time depends on the medication injected and your individual response to the medication. If the numbness is not going away after 48 hours, call your surgeon.  2. The extremity that is blocked will need to be protected until the numbness is gone and the  Strength has  returned. Because you cannot feel it, you will need to take extra care to avoid injury. Because it may be weak, you may have difficulty moving it or using it. You may not know what position it is in without looking at it while the block is in effect.  3. For blocks in the legs and feet, returning to weight bearing and walking needs to be done carefully. You will need to wait until the numbness is entirely gone and the strength has returned. You should be able to move your leg and foot normally before you try and bear weight or walk. You will need someone to be with you when you first try to ensure you do not fall and possibly risk injury.  4. Bruising and tenderness at the needle site are common side effects and will resolve in a few days.  5. Persistent numbness or new problems with movement should be communicated to the surgeon or the [facility] [phone] [place] [phone]).  Information for Discharge Teaching: EXPAREL (bupivacaine liposome injectable suspension)   Your surgeon or anesthesiologist gave you EXPAREL(bupivacaine) to help control your pain after surgery.   EXPAREL is a local anesthetic that provides pain relief by numbing the tissue around the surgical site.  EXPAREL is designed to release pain medication over time and can control pain for up to 72 hours.  Depending on how you respond to EXPAREL, you may require less pain medication during your recovery.  Possible side effects:  Temporary loss of sensation or ability to move in the area where bupivacaine was injected.  Nausea, vomiting, constipation  Rarely, numbness and tingling in your mouth or lips, lightheadedness, or anxiety may occur.  Call your doctor right away if you think you may be experiencing any of these sensations, or if you have other questions regarding possible side effects.  Follow all other discharge instructions given to you by your surgeon or nurse. Eat a  healthy diet and drink plenty of water or other fluids.  If you return to the hospital for any reason within 96 hours following the administration of EXPAREL, it is important for health care providers to know that you have received this anesthetic. A teal colored band has been placed on your arm with the date, time and amount of EXPAREL you have received in order to alert and inform your health care providers. Please leave this armband in place for the full 96 hours following administration, and then you may remove the band.

## 2019-09-01 NOTE — Anesthesia Procedure Notes (Signed)
Procedure Name: LMA Insertion Date/Time: 09/01/2019 1:44 PM Performed by: [place] Chantell Kunkler C, CRNA Pre-anesthesia Checklist: Patient identified, Emergency Drugs available, Suction available and Patient being monitored Patient Re-evaluated:Patient Re-evaluated prior to induction Oxygen Delivery Method: Circle system utilized Preoxygenation: Pre-oxygenation with 100% oxygen Induction Type: IV induction Ventilation: Mask ventilation without difficulty LMA: LMA inserted LMA Size: 4.0 Number of attempts: 1 Airway Equipment and Method: Bite block Placement Confirmation: positive ETCO2 Tube secured with: Tape Dental Injury: Teeth and Oropharynx as per pre-operative assessment

## 2019-09-02 ENCOUNTER — Encounter (HOSPITAL_BASED_OUTPATIENT_CLINIC_OR_DEPARTMENT_OTHER): Payer: Self-pay | Admitting: Orthopaedic Surgery

## 2019-09-02 ENCOUNTER — Ambulatory Visit: Payer: 59 | Admitting: Family Medicine

## 2019-09-02 ENCOUNTER — Ambulatory Visit: Payer: No Typology Code available for payment source | Admitting: Family Medicine

## 2019-09-02 NOTE — Progress Notes (Signed)
Nerve conduction study does show mild bilateral carpal tunnel syndrome.  We will discuss this further at your follow-up visit with me on June 23.

## 2019-09-02 NOTE — Op Note (Signed)
   Date of Surgery: 09/02/2019  INDICATIONS: The patient is a 29 year old male with right shoulder pain that has failed conservative treatment;  The patient did consent to the procedure after discussion of the risks and benefits.  PREOPERATIVE DIAGNOSIS: Right shoulder pain, impingement, posterior labral tear  POSTOPERATIVE DIAGNOSIS: Same.  PROCEDURE:  1.  Right shoulder arthroscopy with extensive debridement including labrum, rotator cuff, subacromial bursitis 2.  Right shoulder subacromial decompression with acromioplasty  SURGEON: N. Eduard Roux, M.D.  ASSIST: none  ANESTHESIA:  general, regional  IV FLUIDS AND URINE: See anesthesia.  ESTIMATED BLOOD LOSS: minimal mL.  IMPLANTS: None  COMPLICATIONS: None.  DESCRIPTION OF PROCEDURE: The patient was brought to the operating room and placed supine on the operating table.  The patient had been signed prior to the procedure and this was documented. The patient had the anesthesia placed by the anesthesiologist.  A time-out was performed to confirm that this was the correct patient, site, side and location. The patient did receive antibiotics prior to the incision and was re-dosed during the procedure as needed at indicated intervals.  The patient was then positioned into the beach chair position with all bony prominences well padded and neutral C spine. The patient had the operative extremity prepped and draped in the standard surgical fashion.    Incisions were made for standard shoulder arthroscopy portals.  Diagnostic shoulder arthroscopy was first performed.  There was no chondromalacia of the glenohumeral surface.  There was mild tendinosis on the articular surface of the rotator cuff which was gently debrided.  The portals were then switched to the and the camera was placed through the anterior portal in order to evaluate the posterior labral tear based on the MRI.  Through probing was able to identify the location of the posterior  inferior labral tear which was degenerative.  I could not find any evidence of a displaced tear that needed to be repaired.  The labral tear was gently debrided back to stable border.  We then reposition the arthroscope into the subacromial space.  He had extensive subacromial bursitis and mild tendinosis of the bursal surface of the rotator cuff.  This was all gently debrided.  A subacromial decompression with acromioplasty was performed.  Excess fluid was removed from the shoulder joint.  Incisions were closed with interrupted nylon sutures.  Sterile dressings were applied.  Shoulder sling placed.  Patient tolerated procedure well had no immediate complications.  POSTOPERATIVE PLAN: Discharge home and follow-up in 1 week.  Azucena Cecil, MD [facility] [phone] 8:05 AM

## 2019-09-02 NOTE — Procedures (Signed)
[facility]  NCS (NERVE CONDUCTION STUDY) WITH EMG (ELECTROMYOGRAPHY) REPORT   STUDY DATE: 08/26/19 PATIENT NAME: Jimmy [name] DOB: 12/18/1990 MRN: [id number]  ORDERING CLINICIAN: Steva Colder, MD  TECHNOLOGIST: Sherre Scarlet ELECTROMYOGRAPHER: Earlean Polka. Laya Letendre, MD  CLINICAL INFORMATION: 29 year old male with bilateral hand and wrist numbness and pain.  FINDINGS: NERVE CONDUCTION STUDY: Bilateral median and right ulnar motor responses are normal.  Bilateral median and right ulnar sensory responses are normal.  Bilateral median to ulnar transcarpal mixed nerve comparison study shows prolonged peak latency differences bilaterally.  Right ulnar F-wave latency is normal.   NEEDLE ELECTROMYOGRAPHY:  Needle examination of right upper extremity is normal.   IMPRESSION:   Abnormal study demonstrating: -Mild bilateral median neuropathies at the wrist consistent with mild bilateral carpal tunnel syndrome.     INTERPRETING PHYSICIAN:  Penni Bombard, MD Certified in Neurology, Neurophysiology and Neuroimaging  [facility] [address] [place], [place] [address] [phone]  [facility]    Nerve / Sites Muscle Latency Ref. Amplitude Ref. Rel Amp Segments Distance Velocity Ref. Area    ms ms mV mV %  cm m/s m/s mVms  R Median - APB     Wrist APB 3.8 ?4.4 7.7 ?4.0 100 Wrist - APB 7   24.9     Upper arm APB 7.9  7.6  98.6 Upper arm - Wrist 23 56 ?49 26.0  L Median - APB     Wrist APB 3.6 ?4.4 7.7 ?4.0 100 Wrist - APB 7   30.9     Upper arm APB 7.6  7.5  97.1 Upper arm - Wrist 23 57 ?49 31.8  R Ulnar - ADM     Wrist ADM 2.3 ?3.3 13.1 ?6.0 100 Wrist - ADM 7   41.4     B.Elbow ADM 5.5  10.2  77.8 B.Elbow - Wrist 23 71 ?49 33.4     A.Elbow ADM 7.0  9.9  97.4 A.Elbow - B.Elbow 10 69 ?49 33.3           SNC    Nerve / Sites Rec. Site Peak Lat Ref.  Amp Ref. Segments Distance Peak Diff Ref.    ms ms V V  cm ms ms  R Median,  Ulnar - Transcarpal comparison     Median Palm Wrist 2.5 ?2.2 75 ?35 Median Palm - Wrist 8       Ulnar Palm Wrist 1.7 ?2.2 44 ?12 Ulnar Palm - Wrist 8          Median Palm - Ulnar Palm  0.8 ?0.4  L Median, Ulnar - Transcarpal comparison     Median Palm Wrist 2.3 ?2.2 131 ?35 Median Palm - Wrist 8       Ulnar Palm Wrist 1.8 ?2.2 38 ?12 Ulnar Palm - Wrist 8          Median Palm - Ulnar Palm  0.5 ?0.4  R Median - Orthodromic (Dig II, Mid palm)     Dig II Wrist 3.2 ?3.4 20 ?10 Dig II - Wrist 13    L Median - Orthodromic (Dig II, Mid palm)     Dig II Wrist 3.2 ?3.4 22 ?10 Dig II - Wrist 13    R Ulnar - Orthodromic, (Dig V, Mid palm)     Dig V Wrist 2.4 ?3.1 11 ?5 Dig V - Wrist 11                 F  Wave    Nerve F Lat Ref.   ms ms  R Ulnar - ADM 26.6 ?32.0       EMG Summary Table    Spontaneous MUAP Recruitment  Muscle IA Fib PSW Fasc Other Amp Dur. Poly Pattern  R. Deltoid Normal None None None _______ Normal Normal Normal Normal  R. Biceps brachii Normal None None None _______ Normal Normal Normal Normal  R. Triceps brachii Normal None None None _______ Normal Normal Normal Normal  R. Flexor carpi radialis Normal None None None _______ Normal Normal Normal Normal  R. First dorsal interosseous Normal None None None _______ Normal Normal Normal Normal

## 2019-09-02 NOTE — Progress Notes (Deleted)
   I, Wendy Poet, LAT, ATC, am serving as scribe for Dr. Lynne Leader.  Jimmy [name] is a 29 y.o. male who presents to [place] at [facility] today for f/u of neck and B arm pain and paresthesias.  He was last seen by Dr. Georgina Snell on 07/26/19 and was referred for a NCV/EMG that he had at [facility] on 08/26/19.     Pertinent review of systems: ***  Relevant historical information: ***   Exam:  There were no vitals taken for this visit. General: Well Developed, well nourished, and in no acute distress.   MSK: ***    Lab and Radiology Results Results for orders placed or performed during the hospital encounter of 08/30/19 (from the past 72 hour(s))  SARS CORONAVIRUS 2 (TAT 6-24 HRS) Nasopharyngeal Nasopharyngeal Swab     Status: None   Collection Time: 08/30/19  9:08 AM   Specimen: Nasopharyngeal Swab  Result Value Ref Range   SARS Coronavirus 2 NEGATIVE NEGATIVE    Comment: (NOTE) SARS-CoV-2 target nucleic acids are NOT DETECTED.  The SARS-CoV-2 RNA is generally detectable in upper and lower respiratory specimens during the acute phase of infection. Negative results do not preclude SARS-CoV-2 infection, do not rule out co-infections with other pathogens, and should not be used as the sole basis for treatment or other patient management decisions. Negative results must be combined with clinical observations, patient history, and epidemiological information. The expected result is Negative.  Fact Sheet for Patients: SugarRoll.be  Fact Sheet for Healthcare Providers: https://www.woods-mathews.com/  This test is not yet approved or cleared by the [place] FDA and  has been authorized for detection and/or diagnosis of SARS-CoV-2 by FDA under an Emergency Use Authorization (EUA). This EUA will remain  in effect (meaning this test can be used) for the duration of the COVID-19 declaration under Se ction 564(b)(1) of the  Act, 21 U.S.C. section 360bbb-3(b)(1), unless the authorization is terminated or revoked sooner.  Performed at [facility], [place] [address] [place], [place] [address]    No results found.     Assessment and Plan: 29 y.o. male with ***   PDMP not reviewed this encounter. No orders of the defined types were placed in this encounter.  No orders of the defined types were placed in this encounter.    Discussed warning signs or symptoms. Please see discharge instructions. Patient expresses understanding.   ***

## 2019-09-03 ENCOUNTER — Telehealth: Payer: Self-pay | Admitting: Orthopaedic Surgery

## 2019-09-03 NOTE — Telephone Encounter (Signed)
I spoke with patient and advised. 

## 2019-09-03 NOTE — Telephone Encounter (Signed)
yes

## 2019-09-03 NOTE — Telephone Encounter (Signed)
Pls advise.  

## 2019-09-03 NOTE — Telephone Encounter (Signed)
P called stating he had surgery and they did a nerve block and his index finger and his arm is still numb; pt just wanted to make sure this was normal?  [phone]

## 2019-09-08 ENCOUNTER — Telehealth: Payer: Self-pay | Admitting: Family Medicine

## 2019-09-08 ENCOUNTER — Other Ambulatory Visit: Payer: Self-pay

## 2019-09-08 ENCOUNTER — Ambulatory Visit (INDEPENDENT_AMBULATORY_CARE_PROVIDER_SITE_OTHER): Payer: No Typology Code available for payment source | Admitting: Family Medicine

## 2019-09-08 ENCOUNTER — Ambulatory Visit (INDEPENDENT_AMBULATORY_CARE_PROVIDER_SITE_OTHER): Payer: No Typology Code available for payment source | Admitting: Orthopaedic Surgery

## 2019-09-08 ENCOUNTER — Encounter: Payer: Self-pay | Admitting: Orthopaedic Surgery

## 2019-09-08 ENCOUNTER — Encounter: Payer: Self-pay | Admitting: Family Medicine

## 2019-09-08 VITALS — BP 130/90 | HR 83 | Ht 64.0 in | Wt 162.0 lb

## 2019-09-08 DIAGNOSIS — M7551 Bursitis of right shoulder: Secondary | ICD-10-CM | POA: Diagnosis not present

## 2019-09-08 DIAGNOSIS — G8929 Other chronic pain: Secondary | ICD-10-CM

## 2019-09-08 DIAGNOSIS — G5600 Carpal tunnel syndrome, unspecified upper limb: Secondary | ICD-10-CM | POA: Insufficient documentation

## 2019-09-08 DIAGNOSIS — G5603 Carpal tunnel syndrome, bilateral upper limbs: Secondary | ICD-10-CM | POA: Diagnosis not present

## 2019-09-08 DIAGNOSIS — S43431A Superior glenoid labrum lesion of right shoulder, initial encounter: Secondary | ICD-10-CM | POA: Diagnosis not present

## 2019-09-08 DIAGNOSIS — M25512 Pain in left shoulder: Secondary | ICD-10-CM

## 2019-09-08 DIAGNOSIS — G5601 Carpal tunnel syndrome, right upper limb: Secondary | ICD-10-CM

## 2019-09-08 MED ORDER — HYDROCODONE-ACETAMINOPHEN 5-325 MG PO TABS: 1.0000 | ORAL_TABLET | Freq: Every day | ORAL | 0 refills | Status: DC | PRN

## 2019-09-08 NOTE — Patient Instructions (Signed)
Thank you for coming in today. The carpal tunnel is mild.  Plan for wrist brace at night.  Let me know what the plan for your left shoulder is.

## 2019-09-08 NOTE — Telephone Encounter (Signed)
Patient came in today requesting to apply for financial assistance. Patient has insurance and tried to explain to him that he could not apply for CAFA. Patient states that he had talked to the financial counselor and was informed he could apply. Please f/u

## 2019-09-08 NOTE — Progress Notes (Signed)
Office Visit Note   Patient: Jimmy [name]           Date of Birth: 03-Nov-1990           MRN: [id number] Visit Date: 09/08/2019              Requested by: Charlott Rakes, MD [place],  [place] [address] PCP: Charlott Rakes, MD   Assessment & Plan: Visit Diagnoses:  1. Glenoid labral tear, right, initial encounter   2. Right carpal tunnel syndrome   3. Chronic left shoulder pain     Plan: Patient is 1 week status post right shoulder scope and debridement.  I refilled hydrocodone today.  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  I think it is unsafe for him to operate machinery that could potentially harm himself or others.  He will need to be out of work for another 5weeks to do physical therapy which I have placed an internal referral for.  For the left shoulder we will obtain an MR arthrogram to evaluate for structural abnormalities.  For the right hand I recommend a nighttime carpal tunnel brace for now.  I do not recommend surgery given how mild the nerve conduction studies were.  Follow-up after the MRI.  Follow-Up Instructions: Return in about 5 weeks (around 10/13/2019).   Orders:  Orders Placed This Encounter  Procedures  . MR Shoulder Left w/ contrast  . DL FLUORO GUIDED NEEDLE PLC ASPIRATION / INJECTTION/LOC  . Ambulatory referral to Physical Therapy   Meds ordered this encounter  Medications  . HYDROcodone-acetaminophen (NORCO) 5-325 MG tablet    Sig: Take 1-2 tablets by mouth daily as needed.    Dispense:  10 tablet    Refill:  0      Procedures: No procedures performed   Clinical Data: No additional findings.   Subjective: Chief Complaint  Patient presents with  . Right Shoulder - Follow-up, Routine Post Op  . Left Shoulder - Pain  . Right Hand - Pain    Patient is 1  week status post right shoulder scope and labral and decompression.  He states his pain is 8 out of 10 and would like a refill on pain medications.  He has been wearing the sling continuously.  He states that the left shoulder feels similar to the right shoulder.  He is also had nerve conduction studies from [facility] neurologic showing mild right carpal tunnel syndrome.   Review of Systems  Constitutional: Negative.   All other systems reviewed and are negative.    Objective: Vital Signs: There were no vitals taken for this visit.  Physical Exam Vitals and nursing note reviewed.  Constitutional:      Appearance: He is well-developed.  Pulmonary:     Effort: Pulmonary effort is normal.  Abdominal:     Palpations: Abdomen is soft.  Skin:    General: Skin is warm.  Neurological:     Mental Status: He is alert and oriented to person, place, and time.  Psychiatric:        Behavior: Behavior normal.        Thought Content: Thought content normal.        Judgment: Judgment normal.     Ortho Exam Right shoulder shows well-healed surgical incisions.  No signs  of infection.  Passive motion is acceptable for 1 week postop.  No neurovascular compromise.  Left shoulder exam shows pain with all planes of range of motion.  Normal manual muscle testing.  Right hand shows no muscle atrophy.  Negative carpal tunnel compressive signs. Specialty Comments:  No specialty comments available.  Imaging: No results found.   PMFS History: Patient Active Problem List   Diagnosis Date Noted  . Carpal tunnel syndrome 09/08/2019  . Bursitis of shoulder, right 08/31/2019  . Incisional hernia, without obstruction or gangrene 05/20/2018  . Opiate dependence ([place]) 08/02/2013   Past Medical History:  Diagnosis Date  . ADHD (attention deficit hyperactivity disorder)   . Anxiety   . Arthritis   . Cancer ([place])    skin cancer  . Chest pain 08/2018  . GERD (gastroesophageal reflux disease)    no  meds  . Migraine    migraines due to pinched nerve  . Pinched nerve in neck   . Pinched nerve in neck   . Scoliosis     Family History  Problem Relation Age of Onset  . Diabetes Mother   . Diabetes Maternal Grandmother   . Cancer Maternal Grandmother        unknown type. doing chemo  . Cancer Maternal Grandfather   . Diabetes Maternal Aunt     Past Surgical History:  Procedure Laterality Date  . ESOPHAGUS SURGERY     as a baby  . SHOULDER ARTHROSCOPY WITH SUBACROMIAL DECOMPRESSION Right 09/01/2019   Procedure: RIGHT SHOULDER ARTHROSCOPY WITH EXTENSIVE DEBRIDEMENT AND SUBACROMIAL DECOMPRESSION;  Surgeon: Leandrew Koyanagi, MD;  Location: [facility];  Service: Orthopedics;  Laterality: Right;  . VENTRAL HERNIA REPAIR N/A 08/31/2018   Procedure: HERNIA REPAIR VENTRAL ADULT;  Surgeon: Olean Ree, MD;  Location: [facility];  Service: General;  Laterality: N/A;   Social History   Occupational History  . Not on file  Tobacco Use  . Smoking status: Current Every Day Smoker    Packs/day: 1.00    Years: 15.00    Pack years: 15.00    Types: Cigarettes  . Smokeless tobacco: Current User  Vaping Use  . Vaping Use: Some days  . Substances: Nicotine  Substance and Sexual Activity  . Alcohol use: Not Currently  . Drug use: Not Currently    Types: Cocaine, Marijuana    Comment: not since 07-2018  . Sexual activity: Yes

## 2019-09-08 NOTE — Progress Notes (Signed)
Jimmy [name], am serving as a Education administrator for Dr. Lynne Leader.  Jimmy [name] is a 29 y.o. male who presents to [facility] today for f/u of B UE paresthesias and review of his UE NCV/EMG from 08/26/19.  Of note, he had a R shoulder posterior labral repair, debridement and subacromial decompression on 09/01/19.  He was last seen by Dr. Georgina Snell on 07/26/19 and had a R C 6-7 epidural on 08/05/19.Since his last visit, pt reports that he is doing better after his surgery.  He had some questions about surgery.  He has follow-up appointment with his surgeon today. Notes he has similar symptoms in his left shoulder that are bothersome and wonders if he would need left shoulder surgery as well.  As for his carpal tunnel syndrome he notes his symptoms are mostly bothersome at night.  He is not had any treatment yet.  He is able to work normally with his hands without developing numbness or tingling.  Diagnostic testing: UE NCV/EMG- 08/26/19; R shoulder MRI- 08/09/19, C-spine MRI- 07/10/19, C-spine and R shoulder XR- 05/24/19    Pertinent review of systems: Fevers or chills  Relevant historical information: History opiate dependence   Exam:  BP 130/90 (BP Location: Left Arm, Patient Position: Sitting, Cuff Size: Normal)   Pulse 83   Ht 5\' 4"  (1.626 m)   Wt 162 lb (73.5 kg)   SpO2 98%   BMI 27.81 kg/m  General: Well Developed, well nourished, and in no acute distress.   MSK:  Hands bilaterally normal-appearing.  Positive Tinel's mildly.  Normal grip    Lab and Radiology Results  EMG: 08/26/19 IMPRESSION:   Abnormal study demonstrating: -Mild bilateral median neuropathies at the wrist consistent with mild bilateral carpal tunnel syndrome.  Op Note by Leandrew Koyanagi, MD at 09/01/2019 4:05 PM  Author: Leandrew Koyanagi, MD Service: Orthopedics Author Type: Physician  Filed: 09/02/2019 8:08 AM Date of Service: 09/01/2019 4:05 PM Status: Signed  Editor: Leandrew Koyanagi, MD (Physician)     Show:Clear all [x] Manual[x] Template[] Copied  Added by: [x] Leandrew Koyanagi, MD  [] Hover for details   Date of Surgery: 09/02/2019  INDICATIONS: The patient is a 29 year old male with right shoulder pain that has failed conservative treatment;  The patient did consent to the procedure after discussion of the risks and benefits.  PREOPERATIVE DIAGNOSIS: Right shoulder pain, impingement, posterior labral tear  POSTOPERATIVE DIAGNOSIS: Same.  PROCEDURE:  1.  Right shoulder arthroscopy with extensive debridement including labrum, rotator cuff, subacromial bursitis 2.  Right shoulder subacromial decompression with acromioplasty  SURGEON: N. Eduard Roux, M.D.  ASSIST: none  ANESTHESIA:  general, regional  IV FLUIDS AND URINE: See anesthesia.  ESTIMATED BLOOD LOSS: minimal mL.  IMPLANTS: None  COMPLICATIONS: None.  DESCRIPTION OF PROCEDURE: The patient was brought to the operating room and placed supine on the operating table.  The patient had been signed prior to the procedure and this was documented. The patient had the anesthesia placed by the anesthesiologist.  A time-out was performed to confirm that this was the correct patient, site, side and location. The patient did receive antibiotics prior to the incision and was re-dosed during the procedure as needed at indicated intervals.  The patient was then positioned into the beach chair position with all bony prominences well padded and neutral C spine. The patient had the operative extremity prepped and draped in the standard surgical fashion.    Incisions were  made for standard shoulder arthroscopy portals.  Diagnostic shoulder arthroscopy was first performed.  There was no chondromalacia of the glenohumeral surface.  There was mild tendinosis on the articular surface of the rotator cuff which was gently debrided.  The portals were then switched to the and the camera was placed through the anterior  portal in order to evaluate the posterior labral tear based on the MRI.  Through probing was able to identify the location of the posterior inferior labral tear which was degenerative.  I could not find any evidence of a displaced tear that needed to be repaired.  The labral tear was gently debrided back to stable border.  We then reposition the arthroscope into the subacromial space.  He had extensive subacromial bursitis and mild tendinosis of the bursal surface of the rotator cuff.  This was all gently debrided.  A subacromial decompression with acromioplasty was performed.  Excess fluid was removed from the shoulder joint.  Incisions were closed with interrupted nylon sutures.  Sterile dressings were applied.  Shoulder sling placed.  Patient tolerated procedure well had no immediate complications.  POSTOPERATIVE PLAN: Discharge home and follow-up in 1 week.  Azucena Cecil, MD [facility] [phone] 8:05 AM           Assessment and Plan: 29 y.o. male with  Bilateral carpal tunnel syndrome.  Based on EMG mild.  Discussed options.  Plan for trial of wrist splint if not improved would consider injection.  Recheck back as needed.  Right shoulder: Status post subacromial decompression.  Fortunately did not have significant labral tear when inspected with surgery.  As no repairs were needed he likely will have a faster recovery although he is following up with Dr. Erlinda Hong today for this.  Care per orthopedics.  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  He wonders if surgery is necessary or reasonable.  I think it probably is.  However reasonable to discuss this with his orthopedic surgeon.  Happy to proceed with further investigation if needed myself.  We may want to consolidate his out of work time.     Discussed warning signs or symptoms. Please see discharge instructions. Patient expresses  understanding.   The above documentation has been reviewed and is accurate and complete Lynne Leader, M.D.    Total encounter time 20 minutes including charting time date of service. Discussed nerve conduction study report and general concepts for carpal tunnel and shoulder treatments.

## 2019-09-08 NOTE — Telephone Encounter (Signed)
Pt was called and inform that he need to contact his insurance since her has question about full coverage or not, he just was inform that this program is only for person that has not insurance, Pt understood

## 2019-09-14 ENCOUNTER — Ambulatory Visit: Payer: No Typology Code available for payment source

## 2019-09-14 ENCOUNTER — Encounter: Payer: Self-pay | Admitting: Family Medicine

## 2019-09-14 ENCOUNTER — Ambulatory Visit: Payer: No Typology Code available for payment source | Attending: Family Medicine | Admitting: Family Medicine

## 2019-09-14 ENCOUNTER — Other Ambulatory Visit: Payer: Self-pay

## 2019-09-14 ENCOUNTER — Ambulatory Visit: Payer: No Typology Code available for payment source | Admitting: Orthopaedic Surgery

## 2019-09-14 DIAGNOSIS — G542 Cervical root disorders, not elsewhere classified: Secondary | ICD-10-CM

## 2019-09-14 DIAGNOSIS — F419 Anxiety disorder, unspecified: Secondary | ICD-10-CM

## 2019-09-14 DIAGNOSIS — F430 Acute stress reaction: Secondary | ICD-10-CM

## 2019-09-14 MED ORDER — HYDROXYZINE HCL 25 MG PO TABS: 25.0000 mg | ORAL_TABLET | Freq: Three times a day (TID) | ORAL | 1 refills | Status: DC | PRN
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
## 2019-09-14 NOTE — Progress Notes (Signed)
Still having pain in right shoulder.  Would like a medication for his nerves.

## 2019-09-15 ENCOUNTER — Ambulatory Visit: Payer: No Typology Code available for payment source | Admitting: Physical Therapy

## 2019-09-21 ENCOUNTER — Ambulatory Visit
Admission: RE | Admit: 2019-09-21 | Discharge: 2019-09-21 | Disposition: A | Discharge status: Home / Self Care | Payer: No Typology Code available for payment source | Source: Ambulatory Visit | Attending: Orthopaedic Surgery | Admitting: Orthopaedic Surgery

## 2019-09-21 ENCOUNTER — Other Ambulatory Visit: Payer: Self-pay

## 2019-09-21 ENCOUNTER — Ambulatory Visit (INDEPENDENT_AMBULATORY_CARE_PROVIDER_SITE_OTHER): Payer: No Typology Code available for payment source | Admitting: Orthopaedic Surgery

## 2019-09-21 ENCOUNTER — Encounter: Payer: Self-pay | Admitting: Orthopaedic Surgery

## 2019-09-21 ENCOUNTER — Ambulatory Visit (INDEPENDENT_AMBULATORY_CARE_PROVIDER_SITE_OTHER): Payer: No Typology Code available for payment source

## 2019-09-21 DIAGNOSIS — G8929 Other chronic pain: Secondary | ICD-10-CM

## 2019-09-21 DIAGNOSIS — S43431A Superior glenoid labrum lesion of right shoulder, initial encounter: Secondary | ICD-10-CM

## 2019-09-21 IMAGING — MR MR SHOULDER*L* W/ CM
6 series · 40 of 40 positions shown · IV contrast (agent unspecified)
Comparison: Images from contrast injection reviewed.

CLINICAL DATA: Left shoulder pain for over 7 years. No known
injury.

EXAM:
MR ARTHROGRAM OF THE LEFT SHOULDER
TECHNIQUE: Multiplanar, multisequence MR imaging of the left shoulder was
performed following the administration of intra-articular contrast.
CONTRAST:  See Injection Documentation.

[Series 4: T1 fat-sat · axial · 4.0mm · 0.27mm/px · z∈[-46,+52]mm · 8 of 21 slices shown (1 of 4)]
[im 1/21]
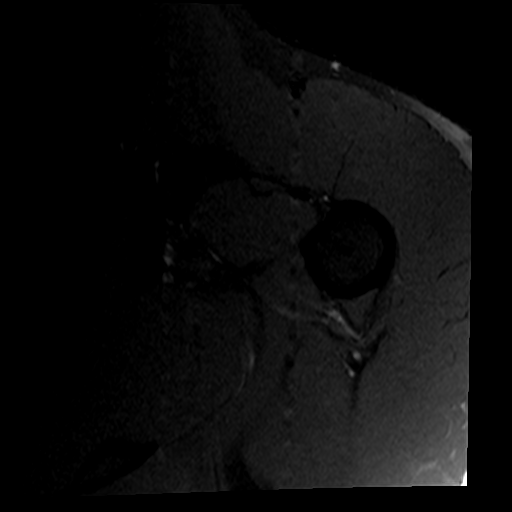
[im 3/21]
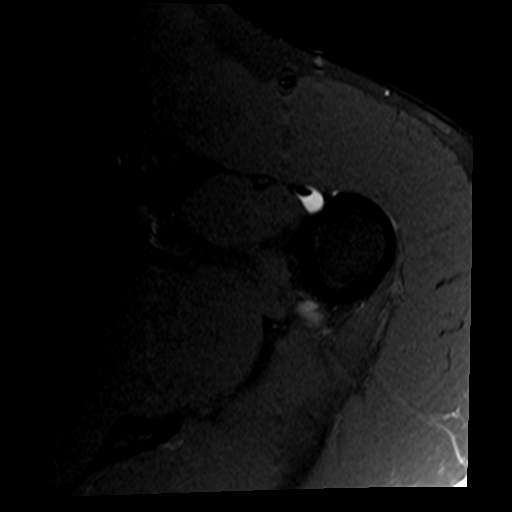
[im 6/21]
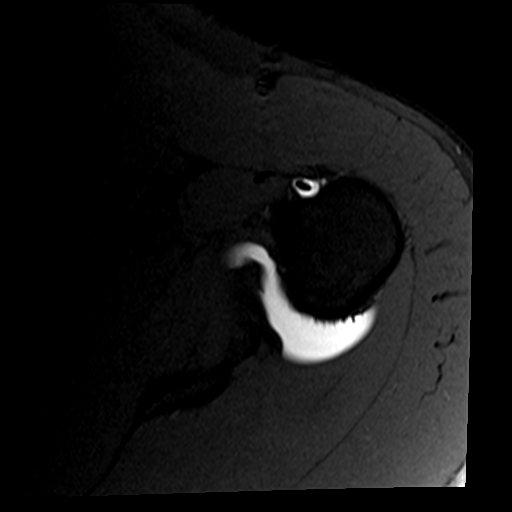
[im 9/21]
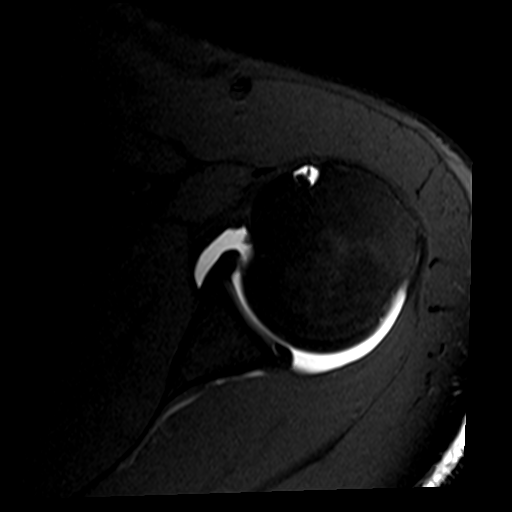
[im 12/21]
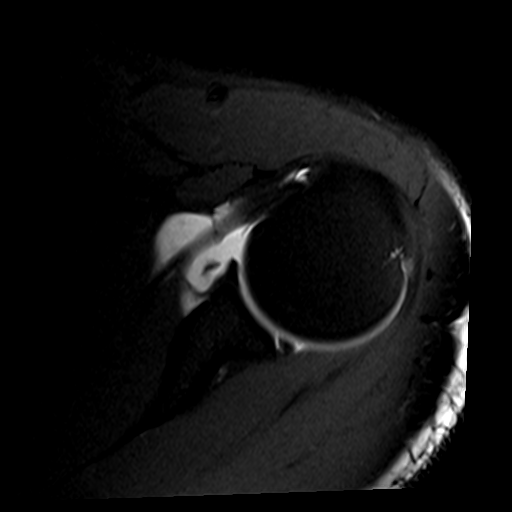
[im 15/21]
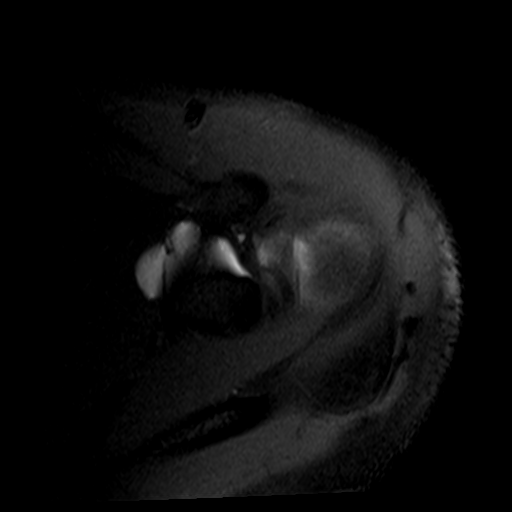
[im 18/21]
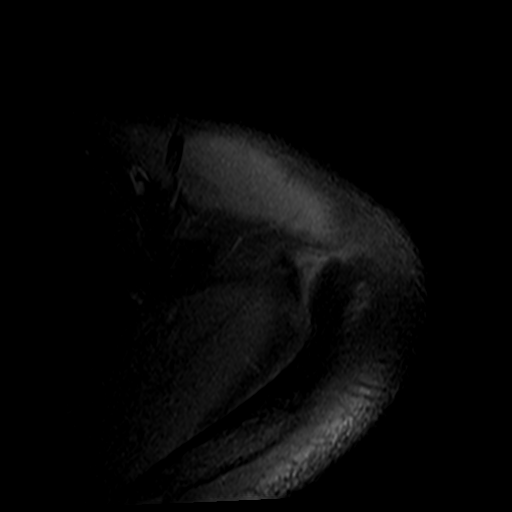
[im 21/21]
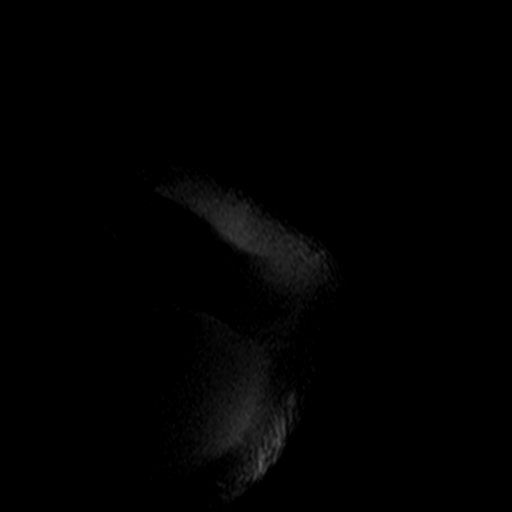

[Series 5: T2 fat-sat · oblique · 4.0mm · 0.55mm/px · 8 of 21 slices shown (1 of 2)]
[im 1/21]
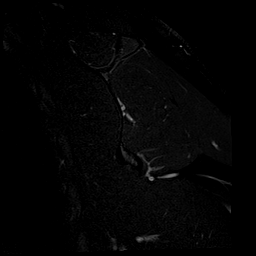
[im 3/21]
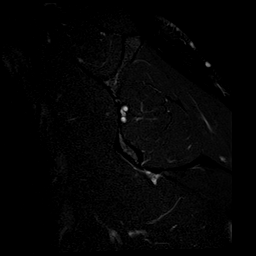
[im 6/21]
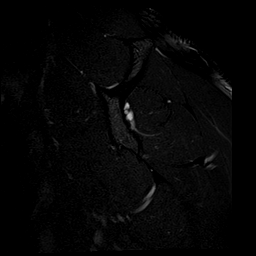
[im 9/21]
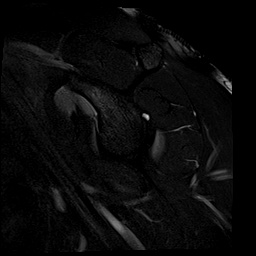
[im 12/21]
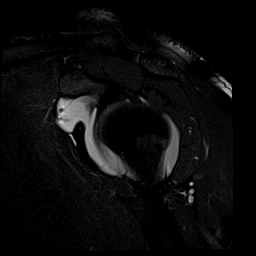
[im 15/21]
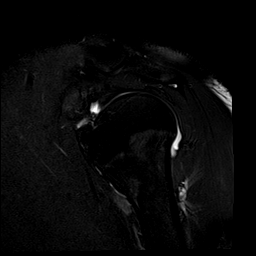
[im 18/21]
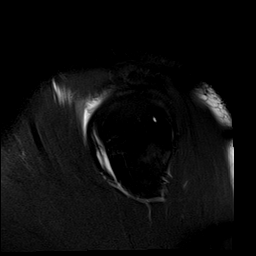
[im 21/21]
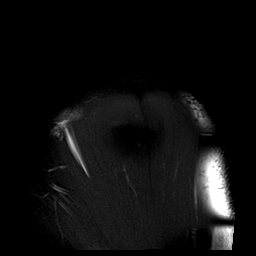

[Series 6: T1 fat-sat · oblique · 4.0mm · 0.55mm/px · 6 of 16 slices shown (2 of 4)]
[im 1/16]
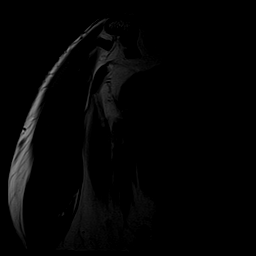
[im 4/16]
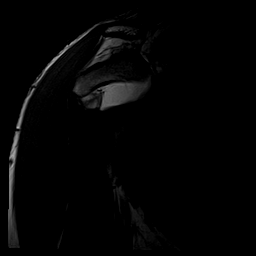
[im 7/16]
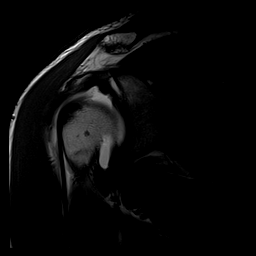
[im 10/16]
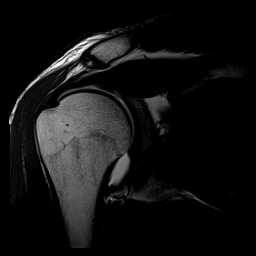
[im 13/16]
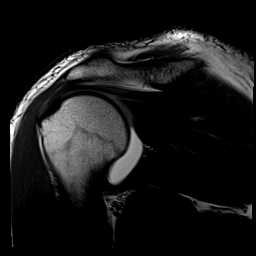
[im 16/16]
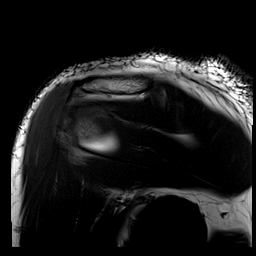

[Series 7: T1 fat-sat · oblique · 4.0mm · 0.55mm/px · 6 of 16 slices shown (3 of 4)]
[im 1/16]
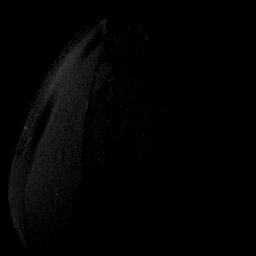
[im 4/16]
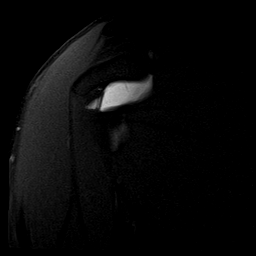
[im 7/16]
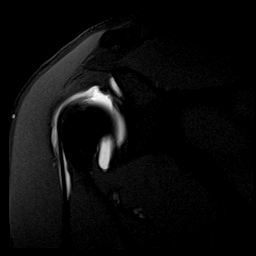
[im 10/16]
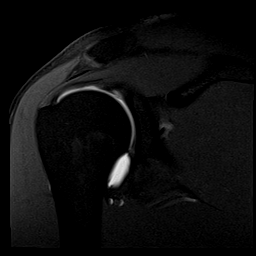
[im 13/16]
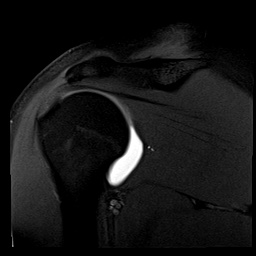
[im 16/16]
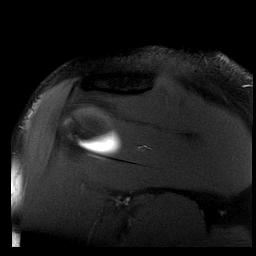

[Series 8: T2 fat-sat · oblique · 4.0mm · 0.55mm/px · 6 of 16 slices shown (2 of 2)]
[im 1/16]
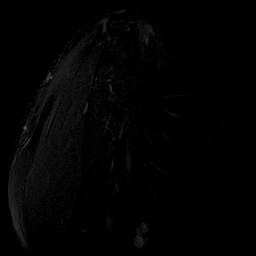
[im 4/16]
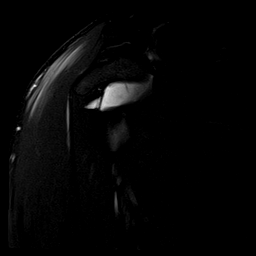
[im 7/16]
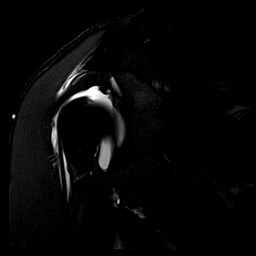
[im 10/16]
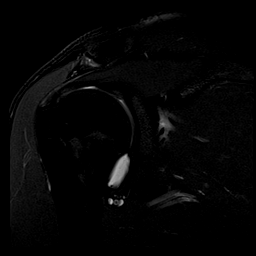
[im 13/16]
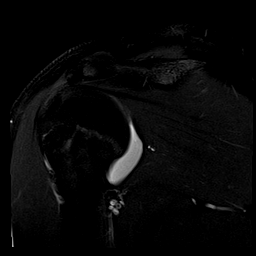
[im 16/16]
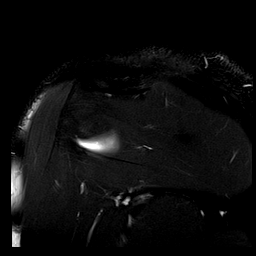

[Series 11: T1 fat-sat · sagittal · 4.0mm · 0.59mm/px · 6 of 16 slices shown (4 of 4)]
[im 1/16]
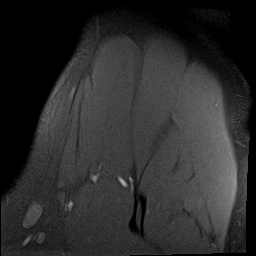
[im 4/16]
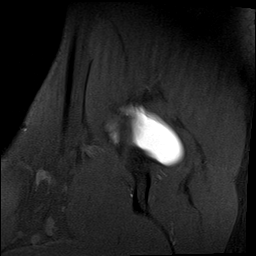
[im 7/16]
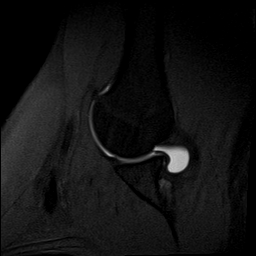
[im 10/16]
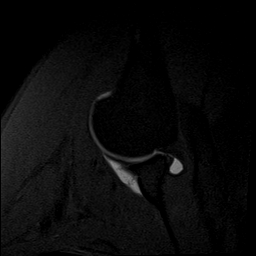
[im 13/16]
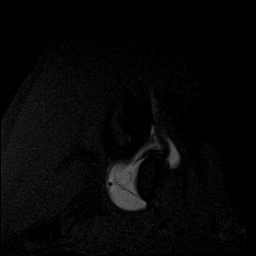
[im 16/16]
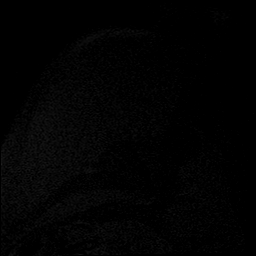

[40 of 40 positions shown; findings below may reference images not displayed]

FINDINGS: Rotator cuff: Intact. Very mild supraspinatus and infraspinatus
tendinopathy noted.

Muscles: Normal.  No atrophy or focal lesion.

Biceps long head: Intact. The biceps attachment to the superior
labrum is intact.

Acromioclavicular Joint: Normal. The acromion is type 1. No contrast
or fluid in the subacromial/subdeltoid bursa.

Glenohumeral Joint: Normal.  Cartilage surface is preserved.

Labrum: The patient has a superior to posterior labral tear. The
tear extends from just anterior to the biceps tendon attachment
across the superior labrum down the posterior labrum to 7 o'clock
position.

Bones: No fracture, contusion or worrisome lesion.
IMPRESSION: Superior to posterior labral tear as described.

Mild supraspinatus and infraspinatus tendinopathy. Negative for
rotator cuff or long head of biceps tear.

## 2019-09-21 IMAGING — XA DG FLUORO GUIDE NDL PLC/BX
2 series · 2 of 2 positions shown · non-contrast
Comparison: none

CLINICAL DATA: Chronic left shoulder pain.

[Series 1: ortho standard · 1 of 1 slices shown (1 of 2)]
[im 1/1]
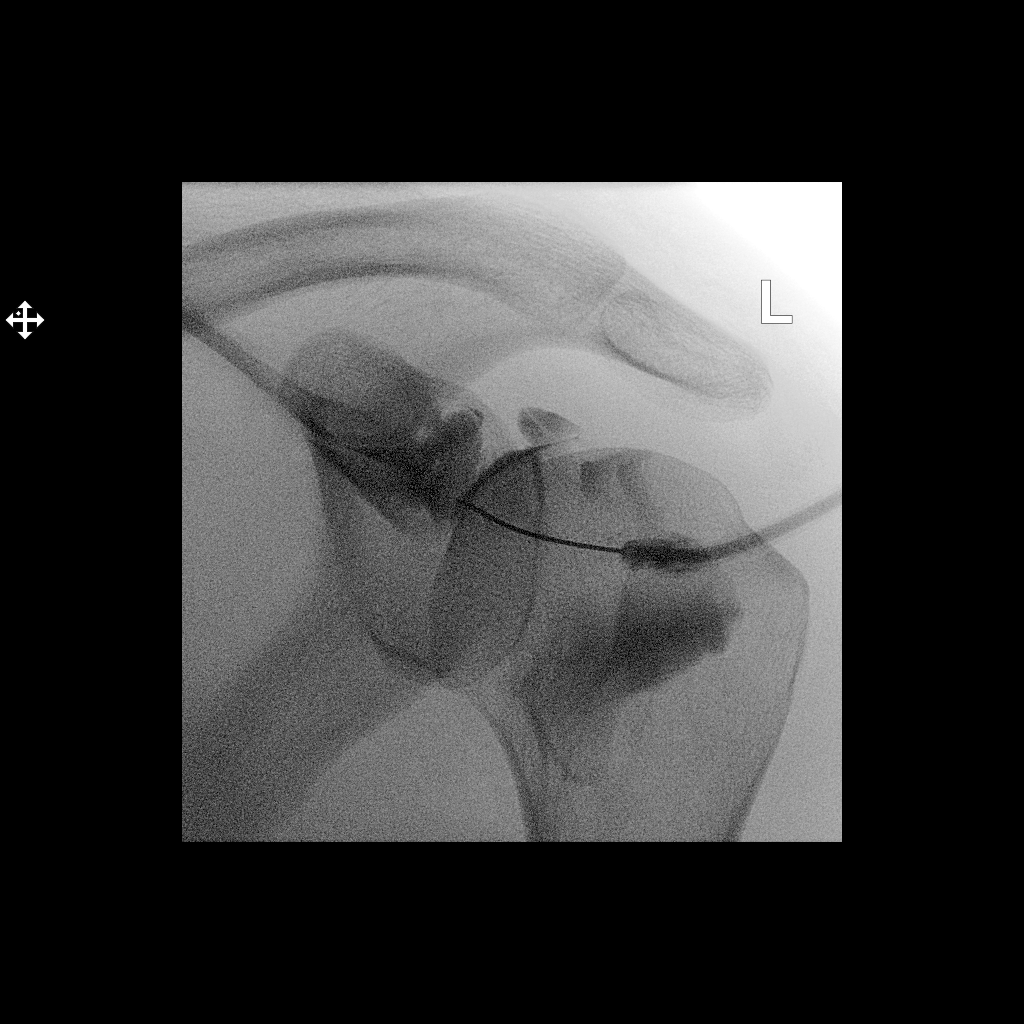

[Series 2: ortho standard · 1 of 1 slices shown (2 of 2)]
[im 1/1]
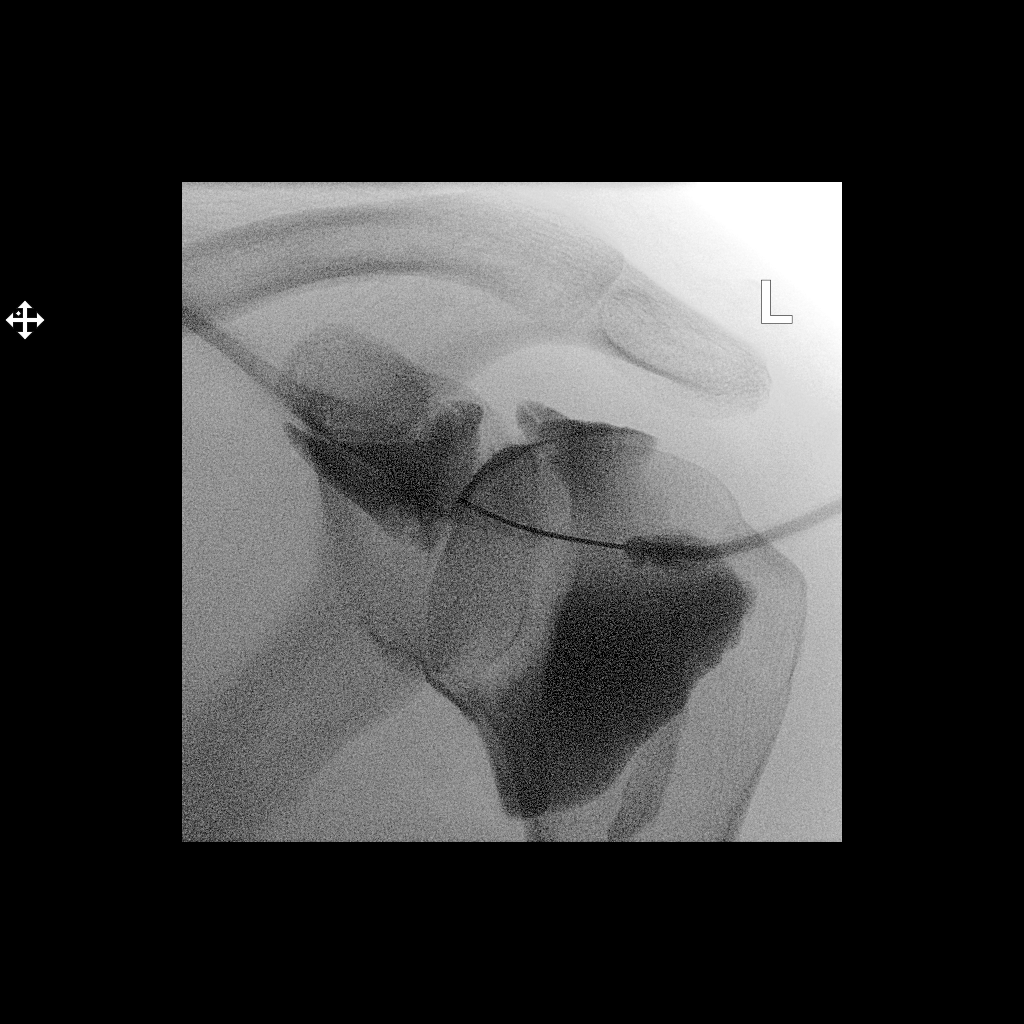

[2 of 2 positions shown; findings below may reference images not displayed]

FLUOROSCOPY TIME:  Radiation Exposure Index (as provided by the
fluoroscopic device): 0.3 mGy

Fluoroscopy Time:  7 seconds

Number of Acquired Images:  0

PROCEDURE:
The risks and benefits of the procedure were discussed with the
patient, and written informed consent was obtained. The patient
stated no history of allergy to contrast media. A formal timeout
procedure was performed with the patient according to departmental
protocol.

The patient was placed supine on the fluoroscopy table and the left
glenohumeral joint was identified under fluoroscopy. The skin
overlying the left glenohumeral joint was subsequently cleaned with
Betadine and a sterile drape was placed over the area of interest. 2
ml 1% Lidocaine was used to anesthetize the skin around the needle
insertion site.

A 22 gauge spinal needle was inserted into the left glenohumeral
joint under fluoroscopy.

12 ml of gadolinium mixture (0.1 ml of Multihance mixed with 10 ml
of Isovue-M 200 contrast and 10 ml of sterile saline) were injected
into the left glenohumeral joint.

The needle was removed and hemostasis was achieved. The patient was
subsequently transferred to MRI for imaging.
IMPRESSION: Technically successful left shoulder injection for MRI.

## 2019-09-21 MED ORDER — IOPAMIDOL (ISOVUE-M 200) INJECTION 41%: 13.0000 mL | Freq: Once | INTRAMUSCULAR | Status: DC

## 2019-09-21 NOTE — Progress Notes (Signed)
Patient ID: Jimmy [name], male   DOB: 08-29-1990, 29 y.o.   MRN: [id number]  [name] is 3 weeks status post right shoulder scope and labral debridement.  He has not started physical therapy his strength and range of motion have improved with home exercises.  His left shoulder MRI is scheduled for today.  Surgical scars of the right shoulder are fully healed.  His range of motion is essentially normal at this point.  Strength is still decreased.  He will need to do physical therapy to help with strengthening.  We will see him back for the left shoulder MRI.  Light duty work note provided today for less than 10 pounds of lifting for the next 6 weeks.

## 2019-09-22 NOTE — Progress Notes (Signed)
Needs f/u appt before 7/27 please.  Thanks.

## 2019-09-23 ENCOUNTER — Telehealth: Payer: Self-pay | Admitting: Family Medicine

## 2019-09-23 DIAGNOSIS — R21 Rash and other nonspecific skin eruption: Secondary | ICD-10-CM

## 2019-09-23 NOTE — Telephone Encounter (Signed)
Please advise   Copied from [place] [phone]. Topic: General - Other >> Sep 23, 2019  7:55 AM Hinda Lenis D wrote: Reason for CRM: PT requesting a call back / issues with medication /  hydrOXYzine (ATARAX/VISTARIL) 25 MG tablet [[id number]] / please advise

## 2019-09-23 NOTE — Telephone Encounter (Signed)
Patient states that the medication makes him itch.  Would like a referral to dermatology for face breakouts.

## 2019-09-24 ENCOUNTER — Ambulatory Visit: Payer: Self-pay | Admitting: *Deleted

## 2019-09-24 DIAGNOSIS — F419 Anxiety disorder, unspecified: Secondary | ICD-10-CM

## 2019-09-24 NOTE — Telephone Encounter (Signed)
Patient was called and a voicemail was left informing patient to return phone call with requested information.

## 2019-09-24 NOTE — Telephone Encounter (Signed)
Call returned by patient to f/u on MD recommendations   Reported to patient MD message: I have placed a referral to dermatology.  He can discontinue hydroxyzine if he is reacting to it.  There are other medications available for anxiety but these are round-the-clock medications and not as needed.  He can be placed on one of these if he so desires.  Patient reports he is willing to try new medication. Care advise given for rash. Patient verbalized understanding of care advise and to call back if rash or symptoms worsen.   Reason for Disposition . Mild localized rash  Answer Assessment - Initial Assessment Questions 1. APPEARANCE of RASH: "Describe the rash."      Raised white bumps on chest  2. LOCATION: "Where is the rash located?"      Center of chest 3. NUMBER: "How many spots are there?"      multiple 4. SIZE: "How big are the spots?" (Inches, centimeters or compare to size of a coin)      Covers center of chest 5. ONSET: "When did the rash start?"      One week after starting medication hyroxizine  6. ITCHING: "Does the rash itch?" If Yes, ask: "How bad is the itch?"  (Scale 1-10; or mild, moderate, severe)     Yes , moderate to severe itching 7. PAIN: "Does the rash hurt?" If Yes, ask: "How bad is the pain?"  (Scale 1-10; or mild, moderate, severe)     No pain only itching 8. OTHER SYMPTOMS: "Do you have any other symptoms?" (e.g., fever)     no  Protocols used: RASH OR REDNESS - LOCALIZED-A-AH

## 2019-09-24 NOTE — Telephone Encounter (Signed)
I have placed a referral to dermatology.  He can discontinue hydroxyzine if he is reacting to it.  There are other medications available for anxiety but these are round-the-clock medications and not as needed.  He can be placed on one of these if he so desires.

## 2019-09-26 NOTE — Telephone Encounter (Signed)
I have placed a Psych referral given he has a history of Bipolar disorder and is already on Seroquel.

## 2019-09-27 ENCOUNTER — Encounter: Payer: Self-pay | Admitting: Family Medicine

## 2019-09-27 ENCOUNTER — Ambulatory Visit (INDEPENDENT_AMBULATORY_CARE_PROVIDER_SITE_OTHER): Payer: No Typology Code available for payment source | Admitting: Physical Therapy

## 2019-09-27 ENCOUNTER — Encounter: Payer: Self-pay | Admitting: Physical Therapy

## 2019-09-27 ENCOUNTER — Other Ambulatory Visit: Payer: Self-pay

## 2019-09-27 DIAGNOSIS — M25511 Pain in right shoulder: Secondary | ICD-10-CM

## 2019-09-27 NOTE — Therapy (Signed)
[facility] [address] [place], [name], [address] Phone: [phone]   Fax:  [phone]  Physical Therapy Evaluation  Patient Details  Name: Jimmy [name] MRN: [id number] Date of Birth: [birth year] Referring Provider (PT): Frankey Shown   Encounter Date: 09/27/2019   PT End of Session - 09/27/19 0810    Visit Number 1    Number of Visits 12    Date for PT Re-Evaluation 11/08/19    Authorization Type [employer]    PT Start Time 0803    PT Stop Time 0845    PT Time Calculation (min) 42 min    Activity Tolerance Patient tolerated treatment well    Behavior During Therapy [facility] for tasks assessed/performed           Past Medical History:  Diagnosis Date  . ADHD (attention deficit hyperactivity disorder)   . Anxiety   . Arthritis   . Cancer ([place])    skin cancer  . Chest pain 08/2018  . GERD (gastroesophageal reflux disease)    no meds  . Migraine    migraines due to pinched nerve  . Pinched nerve in neck   . Pinched nerve in neck   . Scoliosis     Past Surgical History:  Procedure Laterality Date  . ESOPHAGUS SURGERY     as a baby  . SHOULDER ARTHROSCOPY WITH SUBACROMIAL DECOMPRESSION Right 09/01/2019   Procedure: RIGHT SHOULDER ARTHROSCOPY WITH EXTENSIVE DEBRIDEMENT AND SUBACROMIAL DECOMPRESSION;  Surgeon: Leandrew Koyanagi, MD;  Location: [place];  Service: Orthopedics;  Laterality: Right;  . VENTRAL HERNIA REPAIR N/A 08/31/2018   Procedure: HERNIA REPAIR VENTRAL ADULT;  Surgeon: Olean Ree, MD;  Location: [facility];  Service: General;  Laterality: N/A;    There were no vitals filed for this visit.    Subjective Assessment - 09/27/19 0805    Subjective Pt had  R shoulder posterior labral repair, debridement and subacromial decompression on 09/01/19. States motion doing well, but still having pain. Has been on light duty work, 10lb lifting restriction. R handed. Was having significant tingling into bil hands,  now better on R. States mild soreness in R UT region. Had recent epidural injection in neck 07/2019. Also had recent MRI for L shoulder, has not f/u with MD yet for this. Does have pain in L shouder and tingling into L hand. Pt seen previously for cervial radiculopathy.    Limitations Lifting;House hold activities    Patient Stated Goals Decreased pain    Currently in Pain? Yes    Pain Score 7     Pain Location Shoulder    Pain Orientation Right    Pain Descriptors / Indicators Aching    Pain Type Acute pain    Pain Onset More than a month ago    Pain Frequency Intermittent    Aggravating Factors  increased activity    Pain Relieving Factors lifting, moving arm.              [facility] PT Assessment - 09/27/19 0001      Assessment   Medical Diagnosis R shoulder pain     Referring Provider (PT) Naiping Xu    Onset Date/Surgical Date 09/01/19    Hand Dominance Right    Prior Therapy For cervical radic      Precautions   Precaution Comments S/P arthroscopic debridement of RTC and labrum, DCE.       Prior Function   Level of Independence Independent  Cognition   Overall Cognitive Status Within Functional Limits for tasks assessed      AROM   Overall AROM Comments R shoudler: mild limitation for flexion and abd.       Strength   Right Shoulder Flexion 4-/5    Right Shoulder ABduction 4-/5    Right Shoulder Internal Rotation 4/5    Right Shoulder External Rotation 4/5      Palpation   Palpation comment well healed incisions at R shoulder, soreness at superior shoulder at distal clavicle , tender anterior shoulder.       Special Tests   Other special tests not tested                      Objective measurements completed on examination: See above findings.       [facility] Adult PT Treatment/Exercise - 09/27/19 0001      Exercises   Exercises Shoulder      Shoulder Exercises: Supine   External Rotation 15 reps    External Rotation Weight (lbs) 2     Flexion AAROM;20 reps    Flexion Limitations cane      Shoulder Exercises: Sidelying   External Rotation 20 reps      Shoulder Exercises: Standing   External Rotation 15 reps    Theraband Level (Shoulder External Rotation) Level 2 (Red)    Internal Rotation 15 reps    Theraband Level (Shoulder Internal Rotation) Level 2 (Red)    Row 20 reps    Theraband Level (Shoulder Row) Level 3 (Green)      Shoulder Exercises: Pulleys   Flexion 2 minutes                  PT Education - 09/27/19 2159    Education Details PT POC , Exam findings, shoulder post op precautions, HEP    Person(s) Educated Patient    Methods Explanation;Demonstration;Verbal cues;Handout;Tactile cues    Comprehension Verbalized understanding;Returned demonstration;Verbal cues required;Tactile cues required;Need further instruction            PT Short Term Goals - 09/27/19 2153      PT SHORT TERM GOAL #1   Title Pt to be independent with initial HEP    Time 2    Period Weeks    Status New    Target Date 10/11/19             PT Long Term Goals - 09/27/19 2154      PT LONG TERM GOAL #1   Title Pt to be independent with final HEP    Time 6    Period Weeks    Status New    Target Date 11/08/19      PT LONG TERM GOAL #2   Title Pt to repot decreased pain in R shoulder to 0-2/10 with elevation and daily activities.    Time 6    Period Weeks    Status New    Target Date 11/08/19      PT LONG TERM GOAL #3   Title Pt to demo improved strength of bil shoulders, and postural muscles, to be 5/5, to improve ability for reaching, lifing, carrying, and work duties.    Time 6    Period Weeks    Status New    Target Date 11/08/19                  Plan - 09/27/19 2157    Clinical Impression Statement Pt presents  with primary complaint of increased pain in R shoulder, following debridement and scope on 09/01/19. Pt with near full ROM, but is limited in strength and stability, and has pain  with AROM. Pt with 10 lb lifting restriction at this time, and continues to be on light duty. Pt with decreased ability for full functional activities, reaching, lifting, carrying, and work duties, due to deficits. Pt to benefit from skilled PT to improve.    Comorbidities UE numbness /tingling on L,    Examination-Activity Limitations Locomotion Level;Reach Overhead;Carry;Lift;Bathing;Sleep    Examination-Participation Restrictions Cleaning;Community Activity;Driving;Shop;Laundry;Yard Work;Meal Prep    Stability/Clinical Decision Making Stable/Uncomplicated    Clinical Decision Making Low    Rehab Potential Good    PT Frequency 2x / week    PT Duration 6 weeks    PT Treatment/Interventions ADLs/Self Care Home Management;Cryotherapy;Electrical Stimulation;DME Instruction;Ultrasound;Traction;Moist Heat;Iontophoresis 4mg /ml Dexamethasone;Functional mobility training;Therapeutic activities;Therapeutic exercise;Neuromuscular re-education;Manual techniques;Patient/family education;Passive range of motion;Dry needling;Energy conservation;Taping;Visual/perceptual remediation/compensation;Spinal Manipulations;Joint Manipulations    Consulted and Agree with Plan of Care Patient           Patient will benefit from skilled therapeutic intervention in order to improve the following deficits and impairments:  Decreased range of motion, Impaired UE functional use, Increased muscle spasms, Decreased activity tolerance, Pain, Improper body mechanics, Decreased strength, Decreased mobility  Visit Diagnosis: Acute pain of right shoulder     Problem List Patient Active Problem List   Diagnosis Date Noted  . Carpal tunnel syndrome 09/08/2019  . Bursitis of shoulder, right 08/31/2019  . Incisional hernia, without obstruction or gangrene 05/20/2018  . Opiate dependence ([place]) 08/02/2013    Lyndee Hensen, PT, DPT 10:09 PM  09/27/19    [facility] [place], [name], [address] Phone: [phone]   Fax:  [phone]  Name: CHAZE HRUSKA MRN: [id number] Date of Birth: July 02, 1990

## 2019-09-27 NOTE — Patient Instructions (Signed)
Access Code: [email or web address] URL: [email or web address] Date: 09/27/2019 Prepared by: Lyndee Hensen  Exercises Standing Row with Anchored Resistance - 2 x daily - 2 sets - 10 reps Shoulder External Rotation with Anchored Resistance - 1 x daily - 2 sets - 10 reps Shoulder Internal Rotation with Resistance - 1 x daily - 2 sets - 10 reps Sidelying Shoulder External Rotation - 1 x daily - 2 sets - 10 reps Supine Shoulder Flexion with Dowel - 1-2 x daily - 2 sets - 10 reps

## 2019-09-28 ENCOUNTER — Encounter: Payer: Self-pay | Admitting: Family Medicine

## 2019-09-28 ENCOUNTER — Telehealth: Payer: Self-pay | Admitting: Family Medicine

## 2019-09-28 NOTE — Telephone Encounter (Signed)
Please f/u   Copied from [place] #[id number]. Topic: General - Other >> Sep 28, 2019 12:45 PM Hinda Lenis D wrote: Reason for CRM: PT as multiple question for the nurse about his new medication, he doesn't remember the name / please advise

## 2019-09-28 NOTE — Telephone Encounter (Signed)
Returned phone to patient, stated he does not wants to see any psychiatry anymore, he was only seeing a psych MD when he was a kid and hasn't seen one in a while. Requested change of Hydroxyzine with something else due to meds reactions. Please advice !

## 2019-09-28 NOTE — Telephone Encounter (Signed)
Please see my previous message to this patient on 09/24/2019: "I have placed a Psych referral given he has a history of Bipolar disorder and is already on Seroquel."  Unfortunately I would need to defer to Psych for their expert management regarding this.

## 2019-09-29 ENCOUNTER — Ambulatory Visit (INDEPENDENT_AMBULATORY_CARE_PROVIDER_SITE_OTHER): Payer: No Typology Code available for payment source

## 2019-09-29 ENCOUNTER — Other Ambulatory Visit: Payer: Self-pay

## 2019-09-29 ENCOUNTER — Ambulatory Visit
Admission: EM | Admit: 2019-09-29 | Discharge: 2019-09-29 | Disposition: A | Discharge status: Home / Self Care | Payer: No Typology Code available for payment source | Attending: Emergency Medicine | Admitting: Emergency Medicine

## 2019-09-29 DIAGNOSIS — M25572 Pain in left ankle and joints of left foot: Secondary | ICD-10-CM

## 2019-09-29 DIAGNOSIS — S93492A Sprain of other ligament of left ankle, initial encounter: Secondary | ICD-10-CM

## 2019-09-29 DIAGNOSIS — S99912A Unspecified injury of left ankle, initial encounter: Secondary | ICD-10-CM

## 2019-09-29 IMAGING — DX DG ANKLE COMPLETE 3+V*L*
3 series · 3 of 3 positions shown · non-contrast
Comparison: None.

CLINICAL DATA: Left ankle pain after fall, injury.

EXAM:
LEFT ANKLE COMPLETE - 3+ VIEW

[ankle ap]
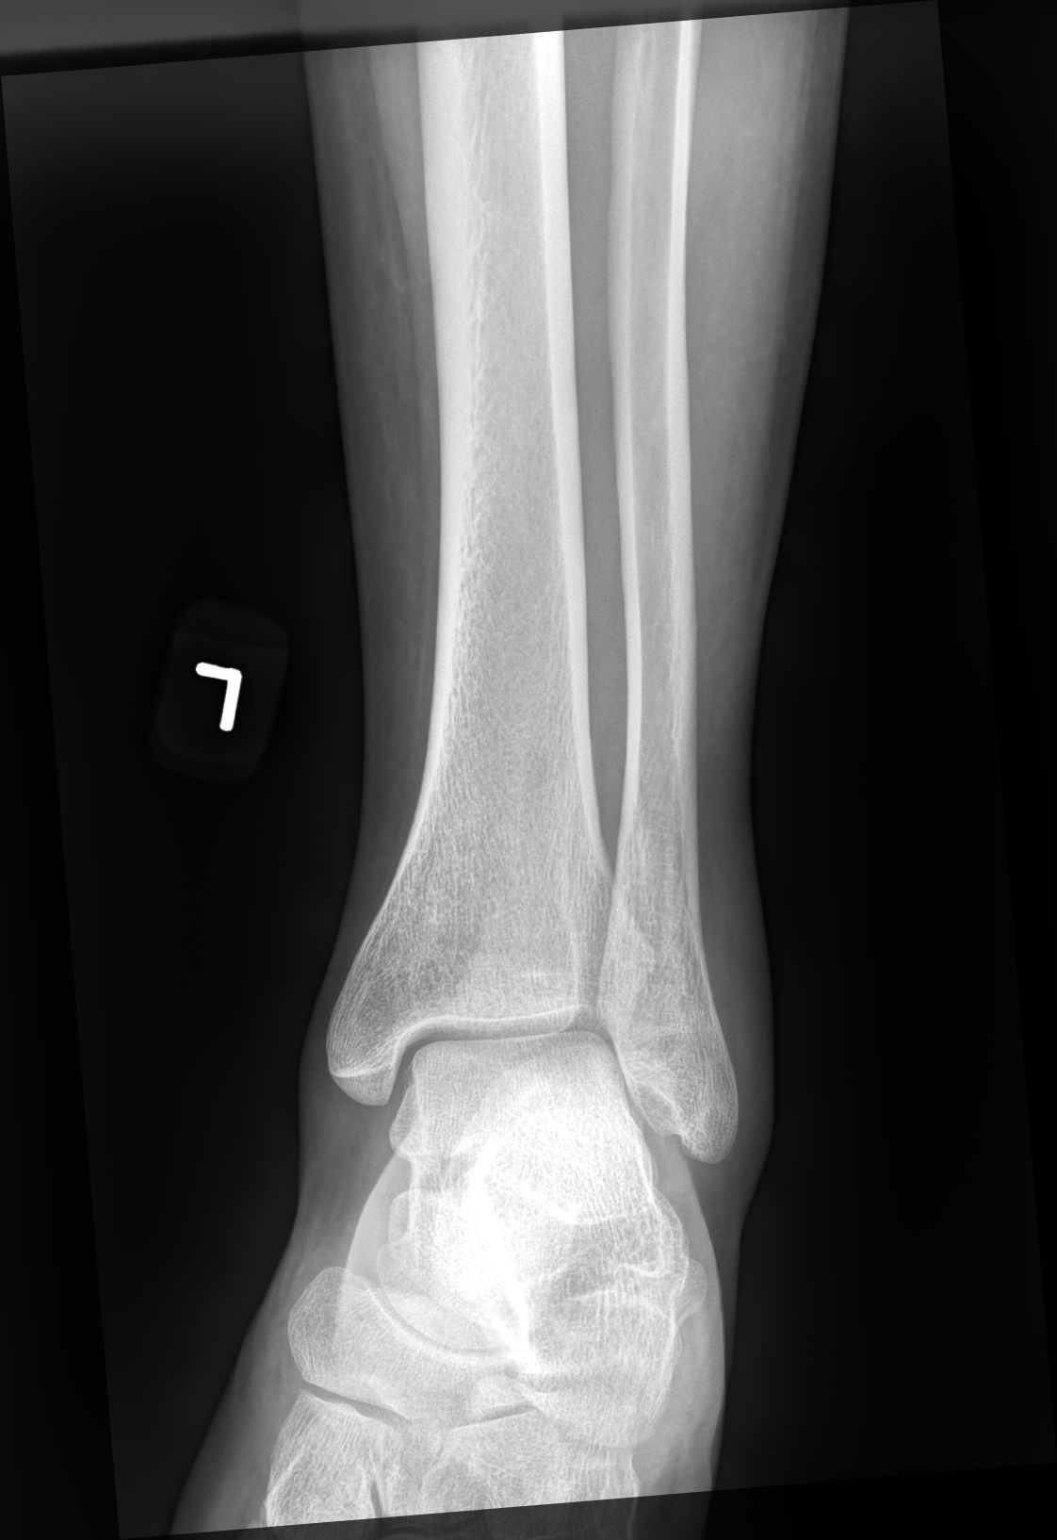

[ankle mlo]
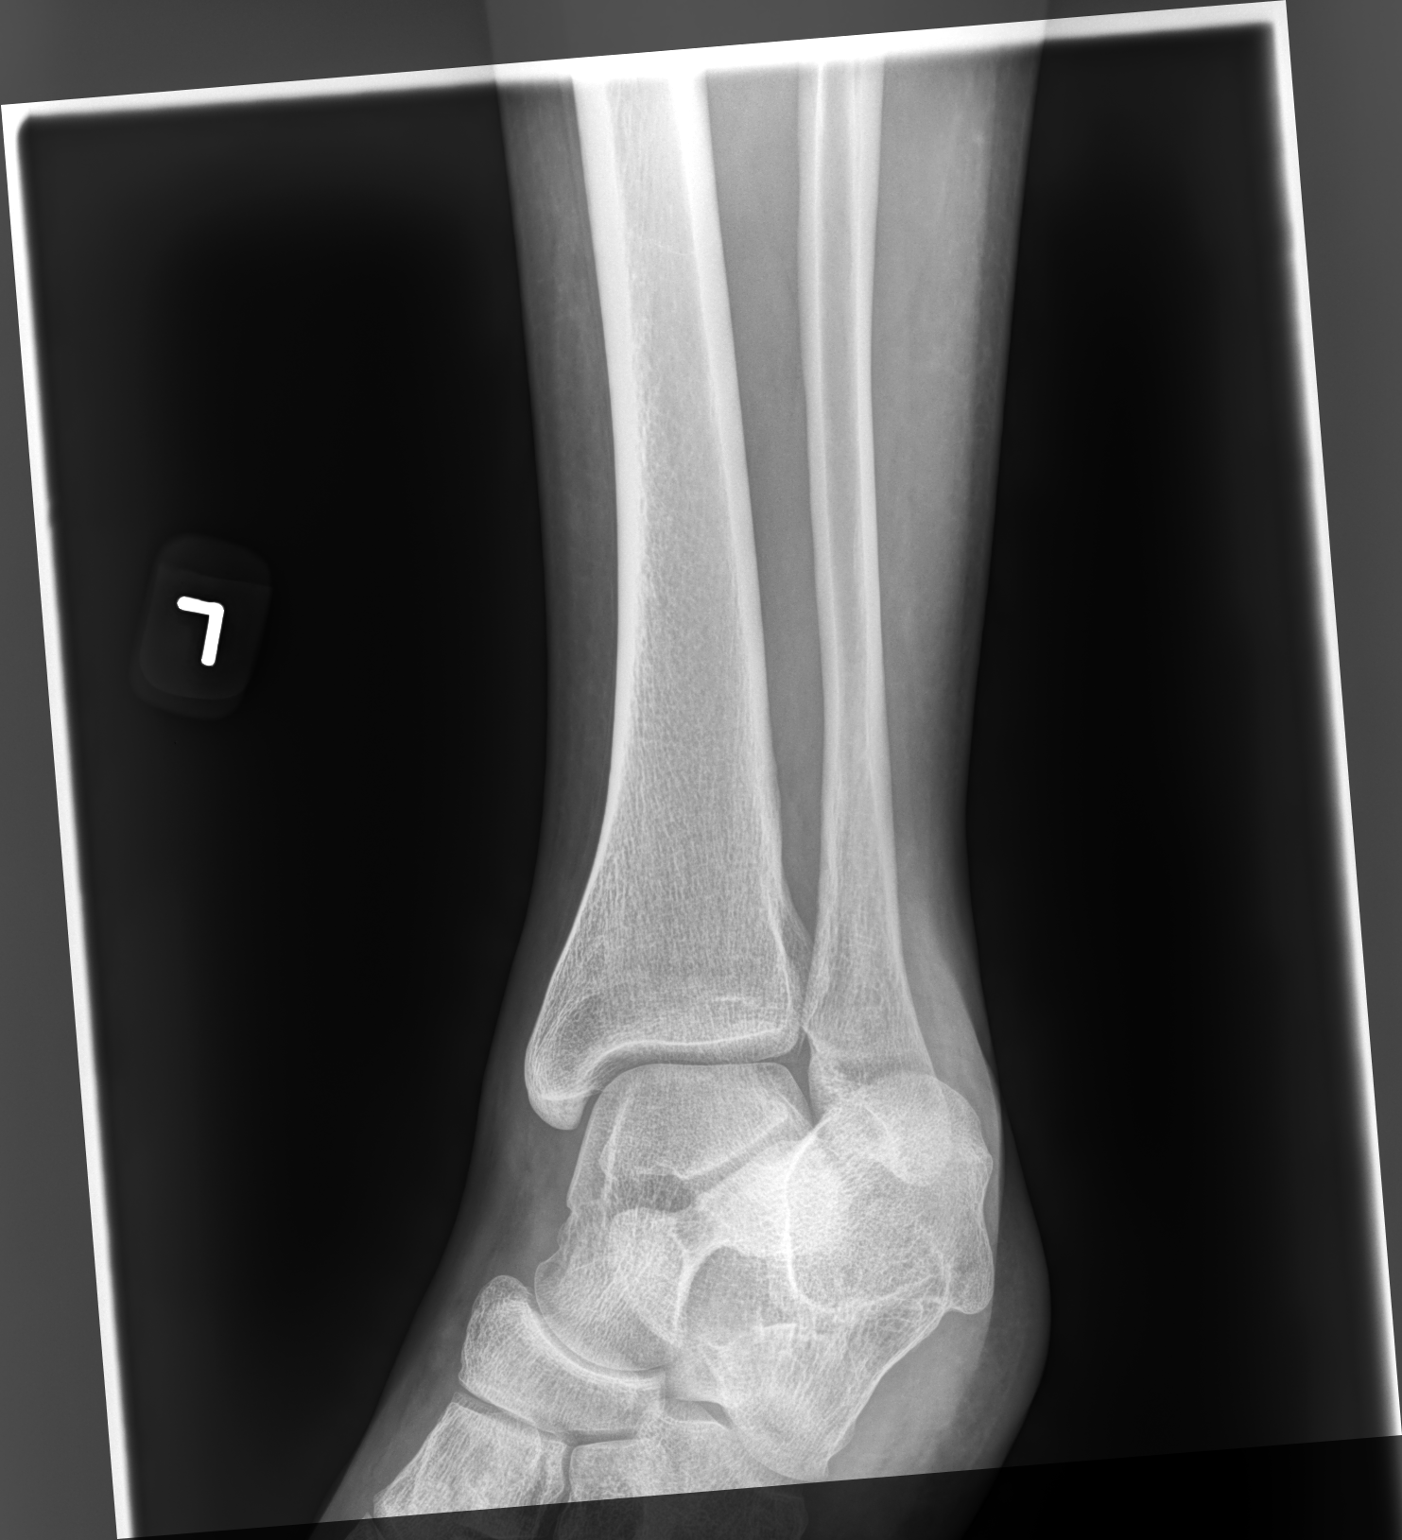

[ankle lat]
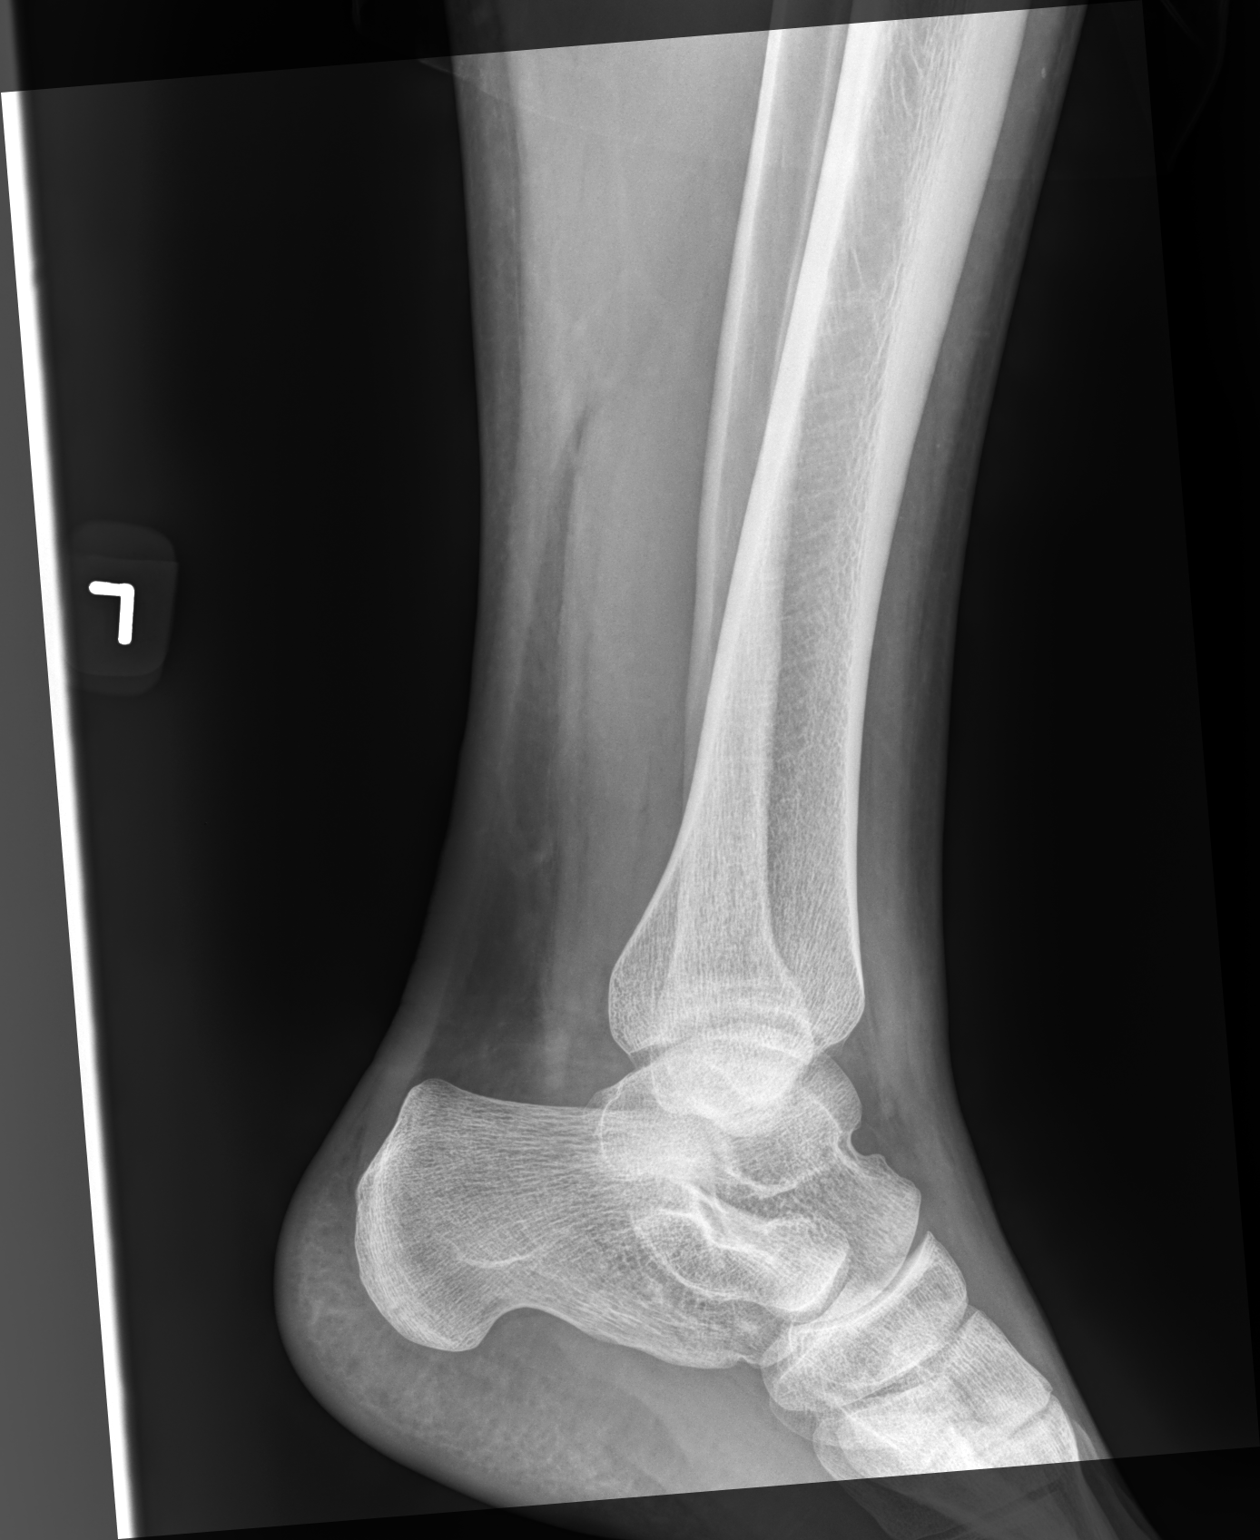

[3 of 3 positions shown; findings below may reference images not displayed]

FINDINGS: There is no evidence of fracture, dislocation, or joint effusion.
The alignment and ankle mortise are preserved. There is no evidence
of arthropathy or other focal bone abnormality. Lateral soft tissue
edema.
IMPRESSION: Lateral soft tissue edema. No fracture or subluxation.

## 2019-09-29 MED ORDER — MELOXICAM 15 MG PO TABS
15.0000 mg | ORAL_TABLET | Freq: Every day | ORAL | 0 refills | Status: DC
Start: 2019-09-29 — End: 2019-12-28

## 2019-09-29 NOTE — Telephone Encounter (Signed)
Called pt unable to reach/ Left voice message to call back. Name and phone nr provided.

## 2019-09-29 NOTE — Discharge Instructions (Addendum)
X-rays negative for fracture or dislocation Continue conservative management of rest, ice, and elevation Ace and crutches Take mobic as needed for pain relief (may cause abdominal discomfort, ulcers, and GI bleeds avoid taking with other NSAIDs) Follow up with bone doctor for work restrictions and further evaluation Return or go to the ER if you have any new or worsening symptoms (fever, chills, chest pain, redness, swelling, bruising, deformity, etc...)

## 2019-09-29 NOTE — ED Triage Notes (Signed)
Pt presents slipping and falling on a trailer today. Pt has pain in his left ankle, reports hearing a pop when he fell.

## 2019-09-29 NOTE — ED Notes (Signed)
Pt refused crutches.

## 2019-09-29 NOTE — ED Provider Notes (Signed)
[facility]   [id number] 09/29/19 Arrival Time: 4098  CC: LT ankle pain  SUBJECTIVE: History from: patient. Jimmy [name] is a 29 y.o. male complains of LT ankle pain/ injury that occurred 30 minutes ago.  Symptoms began after slip and fall on trailer today.  Twisted ankle inwards.  Localizes the pain to the outside and front of ankle.  Describes the pain as intermittent and sharp in character.  Has NOT tried OTC medications.  Symptoms are made worse with weight-bearing.  Reports past hx of broken bones.  Complains of associated swelling.  Denies fever, chills, erythema, ecchymosis, weakness, numbness and tingling.    ROS: As per HPI.  All other pertinent ROS negative.     Past Medical History:  Diagnosis Date  . ADHD (attention deficit hyperactivity disorder)   . Anxiety   . Arthritis   . Cancer ([place])    skin cancer  . Chest pain 08/2018  . GERD (gastroesophageal reflux disease)    no meds  . Migraine    migraines due to pinched nerve  . Pinched nerve in neck   . Pinched nerve in neck   . Scoliosis    Past Surgical History:  Procedure Laterality Date  . ESOPHAGUS SURGERY     as a baby  . SHOULDER ARTHROSCOPY WITH SUBACROMIAL DECOMPRESSION Right 09/01/2019   Procedure: RIGHT SHOULDER ARTHROSCOPY WITH EXTENSIVE DEBRIDEMENT AND SUBACROMIAL DECOMPRESSION;  Surgeon: Leandrew Koyanagi, MD;  Location: [facility];  Service: Orthopedics;  Laterality: Right;  . VENTRAL HERNIA REPAIR N/A 08/31/2018   Procedure: HERNIA REPAIR VENTRAL ADULT;  Surgeon: Olean Ree, MD;  Location: [facility];  Service: General;  Laterality: N/A;   Allergies  Allergen Reactions  . Cyclobenzaprine Hives and Nausea Only  . Depakote [Divalproex Sodium] Other (See Comments)    "made him crazy"  . Ritalin [Methylphenidate Hcl] Other (See Comments)    Increased hyper activeness   . Naproxen Nausea Only    Acid reflux  . Other Other (See Comments)    [facility]-pt states skin became  reddened and he tasted metal (08-2018)  . Tramadol Other (See Comments)    Acid reflux   No current facility-administered medications on file prior to encounter.   Current Outpatient Medications on File Prior to Encounter  Medication Sig Dispense Refill  . Erenumab-aooe (AIMOVIG) 70 MG/ML SOAJ Inject 70 mg into the skin every 28 (twenty-eight) days. 1 pen 11  . gabapentin (NEURONTIN) 300 MG capsule TAKE 1 CAPSULE BY MOUTH AT BEDTIME FOR 7 DAYS 1 CAPSULE TWICE DAILY FOR 7 DAYS 1 CAPSULE THREE TIMES DAILY MAY DOUBLE WEEKLY TO A MAX OF 360    . hydrOXYzine (ATARAX/VISTARIL) 25 MG tablet Take 1 tablet (25 mg total) by mouth 3 (three) times daily as needed. 60 tablet 1  . nicotine (NICODERM CQ) 14 mg/24hr patch Place 1 patch (14 mg total) onto the skin daily. 28 patch 1  . ondansetron (ZOFRAN) 4 MG tablet Take 1-2 tablets (4-8 mg total) by mouth every 8 (eight) hours as needed for nausea or vomiting. 20 tablet 0  . QUEtiapine (SEROQUEL) 100 MG tablet Take 1 tablet (100 mg total) by mouth at bedtime. 30 tablet 6  . [DISCONTINUED] famotidine (PEPCID) 40 MG tablet Take 1 tablet (40 mg total) by mouth daily. 90 tablet 1   Social History   Socioeconomic History  . Marital status: Married    Spouse name: Not on file  . Number of children: Not on file  .  Years of education: Not on file  . Highest education level: Not on file  Occupational History  . Not on file  Tobacco Use  . Smoking status: Current Every Day Smoker    Packs/day: 1.00    Years: 15.00    Pack years: 15.00    Types: Cigarettes  . Smokeless tobacco: Current User  Vaping Use  . Vaping Use: Some days  . Substances: Nicotine  Substance and Sexual Activity  . Alcohol use: Not Currently  . Drug use: Not Currently    Types: Cocaine, Marijuana    Comment: not since 07-2018  . Sexual activity: Yes  Other Topics Concern  . Not on file  Social History Narrative   Right handed   One story home    Drinks caffeine    Social  Determinants of Health   Financial Resource Strain:   . Difficulty of Paying Living Expenses:   Food Insecurity:   . Worried About Charity fundraiser in the Last Year:   . Arboriculturist in the Last Year:   Transportation Needs:   . Film/video editor (Medical):   Marland Kitchen Lack of Transportation (Non-Medical):   Physical Activity:   . Days of Exercise per Week:   . Minutes of Exercise per Session:   Stress:   . Feeling of Stress :   Social Connections:   . Frequency of Communication with Friends and Family:   . Frequency of Social Gatherings with Friends and Family:   . Attends Religious Services:   . Active Member of Clubs or Organizations:   . Attends Archivist Meetings:   Marland Kitchen Marital Status:   Intimate Partner Violence:   . Fear of Current or Ex-Partner:   . Emotionally Abused:   Marland Kitchen Physically Abused:   . Sexually Abused:    Family History  Problem Relation Age of Onset  . Diabetes Mother   . Diabetes Maternal Grandmother   . Cancer Maternal Grandmother        unknown type. doing chemo  . Cancer Maternal Grandfather   . Diabetes Maternal Aunt     OBJECTIVE:  Vitals:   09/29/19 1620  BP: (!) 155/92  Pulse: 89  Resp: 17  Temp: 98.7 F (37.1 C)  TempSrc: Tympanic  SpO2: 97%    General appearance: ALERT; in no acute distress.  Head: NCAT Lungs: Normal respiratory effort CV: Dorsalis pedis pulse 2+. Cap refill < 2 seconds Musculoskeletal: LT ankle Inspection: Skin warm, dry, clear and intact without obvious erythema, effusion, or ecchymosis.  Palpation: TTP over lateral and anterior ankle ROM: LROM Strength: 3+/5 dorsiflexion, 3+/5 plantar flexion Skin: warm and dry Neurologic: Ambulates without difficulty; Sensation intact about the lower extremities Psychological: alert and cooperative; normal mood and affect  DIAGNOSTIC STUDIES:  DG Ankle Complete Left  Result Date: 09/29/2019 CLINICAL DATA:  Left ankle pain after fall, injury. EXAM: LEFT  ANKLE COMPLETE - 3+ VIEW COMPARISON:  None. FINDINGS: There is no evidence of fracture, dislocation, or joint effusion. The alignment and ankle mortise are preserved. There is no evidence of arthropathy or other focal bone abnormality. Lateral soft tissue edema. IMPRESSION: Lateral soft tissue edema. No fracture or subluxation. Electronically Signed   By: Keith Rake M.D.   On: 09/29/2019 16:39   X-rays negative for bony abnormalities including fracture, or dislocation.    I have reviewed the x-rays myself and the radiologist interpretation. I am in agreement with the radiologist interpretation.  ASSESSMENT & PLAN:  1. Acute left ankle pain   2. Injury of left ankle, initial encounter   3. Sprain of anterior talofibular ligament of left ankle, initial encounter     Meds ordered this encounter  Medications  . meloxicam (MOBIC) 15 MG tablet    Sig: Take 1 tablet (15 mg total) by mouth daily.    Dispense:  20 tablet    Refill:  0    Order Specific Question:   Supervising Provider    Answer:   Raylene Everts [[id number]]   X-rays negative for fracture or dislocation Continue conservative management of rest, ice, and elevation Ace and crutches Take naproxen as needed for pain relief (may cause abdominal discomfort, ulcers, and GI bleeds avoid taking with other NSAIDs) Follow up with bone doctor for work restrictions and further evaluation Return or go to the ER if you have any new or worsening symptoms (fever, chills, chest pain, redness, swelling, bruising, deformity, etc...)   Reviewed expectations re: course of current medical issues. Questions answered. Outlined signs and symptoms indicating need for more acute intervention. Patient verbalized understanding. After Visit Summary given.    Lestine Box, PA-C 09/29/19 1700

## 2019-09-30 ENCOUNTER — Ambulatory Visit (INDEPENDENT_AMBULATORY_CARE_PROVIDER_SITE_OTHER): Payer: No Typology Code available for payment source | Admitting: Family Medicine

## 2019-09-30 ENCOUNTER — Encounter: Payer: Self-pay | Admitting: Family Medicine

## 2019-09-30 VITALS — BP 140/90 | HR 96 | Ht 64.0 in | Wt 163.0 lb

## 2019-09-30 DIAGNOSIS — S93492A Sprain of other ligament of left ankle, initial encounter: Secondary | ICD-10-CM | POA: Diagnosis not present

## 2019-09-30 MED ORDER — HYDROCODONE-ACETAMINOPHEN 5-325 MG PO TABS: 1.0000 | ORAL_TABLET | Freq: Four times a day (QID) | ORAL | 0 refills | Status: DC | PRN

## 2019-09-30 NOTE — Patient Instructions (Addendum)
Thank you for coming in today. Try using the boot.  Try very hard to avoid crutch with your right arm.   Recheck with me in early august.  Ok to wean into an ankle brace when able.  Look up ASO ankle brace on [place] or get one at pharmacy.    I will add on ankle PT.    Ankle Sprain, Phase I Rehab An ankle sprain is an injury to the ligaments of your ankle. Ankle sprains cause stiffness, loss of motion, and loss of strength. Ask your health care provider which exercises are safe for you. Do exercises exactly as told by your health care provider and adjust them as directed. It is normal to feel mild stretching, pulling, tightness, or discomfort as you do these exercises. Stop right away if you feel sudden pain or your pain gets worse. Do not begin these exercises until told by your health care provider. Stretching and range-of-motion exercises These exercises warm up your muscles and joints and improve the movement and flexibility of your lower leg and ankle. These exercises also help to relieve pain and stiffness. Gastroc and soleus stretch This exercise is also called a calf stretch. It stretches the muscles in the back of the lower leg. These muscles are the gastrocnemius, or gastroc, and the soleus. 1. Sit on the floor with your left / right leg extended. 2. Loop a belt or towel around the ball of your left / right foot. The ball of your foot is on the walking surface, right under your toes. 3. Keep your left / right ankle and foot relaxed and keep your knee straight while you use the belt or towel to pull your foot toward you. You should feel a gentle stretch behind your calf or knee in your gastroc muscle. 4. Hold this position for __________ seconds, then release to the starting position. 5. Repeat the exercise with your knee bent. You can put a pillow or a rolled bath towel under your knee to support it. You should feel a stretch deep in your calf in the soleus muscle or at your Achilles  tendon. Repeat __________ times. Complete this exercise __________ times a day. Ankle alphabet  1. Sit with your left / right leg supported at the lower leg. ? Do not rest your foot on anything. ? Make sure your foot has room to move freely. 2. Think of your left / right foot as a paintbrush. ? Move your foot to trace each letter of the alphabet in the air. Keep your hip and knee still while you trace. ? Make the letters as large as you can without feeling discomfort. 3. Trace every letter from A to Z. Repeat __________ times. Complete this exercise __________ times a day. Strengthening exercises These exercises build strength and endurance in your ankle and lower leg. Endurance is the ability to use your muscles for a long time, even after they get tired. Ankle dorsiflexion  1. Secure a rubber exercise band or tube to an object, such as a table leg, that will stay still when the band is pulled. Secure the other end around your left / right foot. 2. Sit on the floor facing the object, with your left / right leg extended. The band or tube should be slightly tense when your foot is relaxed. 3. Slowly bring your foot toward you, bringing the top of your foot toward your shin (dorsiflexion), and pulling the band tighter. 4. Hold this position for __________ seconds. 5.  Slowly return your foot to the starting position. Repeat __________ times. Complete this exercise __________ times a day. Ankle plantar flexion  1. Sit on the floor with your left / right leg extended. 2. Loop a rubber exercise tube or band around the ball of your left / right foot. The ball of your foot is on the walking surface, right under your toes. ? Hold the ends of the band or tube in your hands. ? The band or tube should be slightly tense when your foot is relaxed. 3. Slowly point your foot and toes downward to tilt the top of your foot away from your shin (plantar flexion). 4. Hold this position for __________  seconds. 5. Slowly return your foot to the starting position. Repeat __________ times. Complete this exercise __________ times a day. Ankle eversion 1. Sit on the floor with your legs straight out in front of you. 2. Loop a rubber exercise band or tube around the ball of your left / right foot. The ball of your foot is on the walking surface, right under your toes. ? Hold the ends of the band in your hands, or secure the band to a stable object. ? The band or tube should be slightly tense when your foot is relaxed. 3. Slowly push your foot outward, away from your other leg (eversion). 4. Hold this position for __________ seconds. 5. Slowly return your foot to the starting position. Repeat __________ times. Complete this exercise __________ times a day. This information is not intended to replace advice given to you by your health care provider. Make sure you discuss any questions you have with your health care provider. Document Revised: 06/23/2018 Document Reviewed: 12/15/2017 Elsevier Patient Education  2020 [employer].

## 2019-09-30 NOTE — Progress Notes (Signed)
I Jimmy [name] am serving as a Education administrator for Dr. Lynne Leader.   Jimmy [name] is a 29 y.o. male who presents to [place] at [facility] today for L ankle pain after spraining his ankle yesterday into inversion.  He locates his pain to his L ant-lat ankle.  He was seen yesterday at the [facility]. States he heard a crack. Can't weight bare. Has injured the foot before. Ankle is stiff. Numbness and tingling intermitted. Bruising on the foot.  He was prescribed meloxicam but this is not sufficient to control his pain.  He has severe pain.  Radiating pain: calf, achilles  L ankle swelling: yes  Aggravating factors: weight bearing  Treatments tried: Meloxicam, tylenol, ice, ace wrap    Diagnostic testing: L ankle XR- 09/29/19   Pertinent review of systems: No fevers or chills  Relevant historical information: Right shoulder labrum surgery few weeks ago.  History of opiate dependency   Exam:  BP 140/90 (BP Location: Left Arm, Patient Position: Sitting)   Pulse 96   Ht 5\' 4"  (1.626 m)   Wt 163 lb (73.9 kg)   SpO2 98%   BMI 27.98 kg/m  General: Well Developed, well nourished, and in no acute distress.   MSK: Left ankle swollen and tender at ATFL region. Decreased foot and ankle motion. Stability not tested due to guarding. Strength not tested due to guarding. Pulses capillary fill and sensation are intact distally.    Lab and Radiology Results No results found for this or any previous visit (from the past 72 hour(s)). DG Ankle Complete Left  Result Date: 09/29/2019 CLINICAL DATA:  Left ankle pain after fall, injury. EXAM: LEFT ANKLE COMPLETE - 3+ VIEW COMPARISON:  None. FINDINGS: There is no evidence of fracture, dislocation, or joint effusion. The alignment and ankle mortise are preserved. There is no evidence of arthropathy or other focal bone abnormality. Lateral soft tissue edema. IMPRESSION: Lateral soft tissue edema. No fracture or  subluxation. Electronically Signed   By: Jimmy [name] M.D.   On: 09/29/2019 16:39  I, Lynne Leader, personally (independently) visualized and performed the interpretation of the images attached in this note.      Assessment and Plan: 29 y.o. male with left ankle sprain.  Patient is in quite a bit of pain in his care is a bit complicated by his recent right shoulder labrum surgery.  Ideally he should not be using bilateral crutches.  However he is hurting badly enough that he is having trouble ambulating even using a cam walker boot.  Stressed the importance of trying very hard to just stick to one crutch in his left hand.  Provided ASO brace as he cannot tolerate a cam walker at this time.  He already has meloxicam which is not sufficient to control his pain.  Discussed risks of hydrocodone especially given his history of opiate dependency.  Provided 5 pills of hydrocodone.  Recheck back in 2 to 3 weeks.  Work note provided.  Will modify work note if not able to work.   PDMP reviewed during this encounter. Orders Placed This Encounter  Procedures  . Ambulatory referral to Physical Therapy    Referral Priority:   Routine    Referral Type:   Physical Medicine    Referral Reason:   Specialty Services Required    Requested Specialty:   Physical Therapy   Meds ordered this encounter  Medications  . HYDROcodone-acetaminophen (NORCO/VICODIN) 5-325 MG tablet  Sig: Take 1 tablet by mouth every 6 (six) hours as needed.    Dispense:  5 tablet    Refill:  0     Discussed warning signs or symptoms. Please see discharge instructions. Patient expresses understanding.   The above documentation has been reviewed and is accurate and complete Lynne Leader, M.D.

## 2019-10-01 ENCOUNTER — Telehealth: Payer: Self-pay | Admitting: Family Medicine

## 2019-10-01 ENCOUNTER — Ambulatory Visit (INDEPENDENT_AMBULATORY_CARE_PROVIDER_SITE_OTHER): Payer: No Typology Code available for payment source | Admitting: Orthopaedic Surgery

## 2019-10-01 ENCOUNTER — Encounter: Payer: Self-pay | Admitting: Family Medicine

## 2019-10-01 ENCOUNTER — Encounter: Payer: Self-pay | Admitting: Orthopaedic Surgery

## 2019-10-01 VITALS — Ht 64.0 in | Wt 163.0 lb

## 2019-10-01 DIAGNOSIS — S43432A Superior glenoid labrum lesion of left shoulder, initial encounter: Secondary | ICD-10-CM

## 2019-10-01 NOTE — Telephone Encounter (Signed)
error 

## 2019-10-01 NOTE — Progress Notes (Signed)
Office Visit Note   Patient: Jimmy [name]           Date of Birth: 1990-03-21           MRN: [id number] Visit Date: 10/01/2019              Requested by: Charlott Rakes, MD [place],  [place] [address] PCP: Charlott Rakes, MD   Assessment & Plan: Visit Diagnoses:  1. Superior labrum anterior-to-posterior (SLAP) tear of left shoulder     Plan: MRI of the left shoulder shows a superior labral tear extending posteriorly into the 7 o'clock position.  No para labral cysts.  Rest of the MRI is fairly unremarkable.  These findings were reviewed with the patient and after discussion of treatment options including nonsurgical treatment and cortisone injections patient has elected to proceed with a left shoulder arthroscopy and biceps tenodesis.  In terms of his right shoulder he is progressing very well with physical therapy.  He will continue with this.  Follow-Up Instructions: Return if symptoms worsen or fail to improve.   Orders:  No orders of the defined types were placed in this encounter.  No orders of the defined types were placed in this encounter.     Procedures: No procedures performed   Clinical Data: No additional findings.   Subjective: Chief Complaint  Patient presents with  . Right Shoulder - Routine Post Op    09/01/19 Right Shoulder Arthro & Laberal Deb  . Left Shoulder - Pain, Follow-up    MRI Review     Patient returns today for MRI review of the left shoulder.  He is 4 weeks status post right shoulder arthroscopy and debridement.  He is [facility] [place] [facility].  He is doing well overall.  He is currently out of work due to recent left ankle sprain.  No changes in left shoulder symptoms.   Review of Systems   Objective: Vital Signs: Ht 5\' 4"  (1.626 m)   Wt 163 lb (73.9 kg)   BMI 27.98 kg/m   Physical Exam  Ortho Exam Right shoulder shows fully healed surgical scars.  He has full range of motion at  this point.  Strength is progressing very nicely almost completely normal.  Left shoulder exam is unchanged. Specialty Comments:  No specialty comments available.  Imaging: No results found.   PMFS History: Patient Active Problem List   Diagnosis Date Noted  . 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 Carpal tunnel syndrome 09/08/2019  . Bursitis of shoulder, right 08/31/2019  . Incisional hernia, without obstruction or gangrene 05/20/2018  . Opiate dependence ([place]) 08/02/2013   Past Medical History:  Diagnosis Date  . ADHD (attention deficit hyperactivity disorder)   . Anxiety   . Arthritis   . Cancer ([place])    skin cancer  . Chest pain 08/2018  . GERD (gastroesophageal reflux disease)    no meds  . Migraine    migraines due to pinched nerve  . Pinched nerve in neck   . Pinched nerve in neck   . Scoliosis     Family History  Problem Relation Age of Onset  . Diabetes Mother   . Diabetes Maternal Grandmother   . Cancer Maternal Grandmother        unknown type. doing chemo  . Cancer Maternal Grandfather   . Diabetes Maternal Aunt     Past Surgical History:  Procedure Laterality Date  .  ESOPHAGUS SURGERY     as a baby  . SHOULDER ARTHROSCOPY WITH SUBACROMIAL DECOMPRESSION Right 09/01/2019   Procedure: RIGHT SHOULDER ARTHROSCOPY WITH EXTENSIVE DEBRIDEMENT AND SUBACROMIAL DECOMPRESSION;  Surgeon: Leandrew Koyanagi, MD;  Location: [place];  Service: Orthopedics;  Laterality: Right;  . VENTRAL HERNIA REPAIR N/A 08/31/2018   Procedure: HERNIA REPAIR VENTRAL ADULT;  Surgeon: Olean Ree, MD;  Location: [facility];  Service: General;  Laterality: N/A;   Social History   Occupational History  . Not on file  Tobacco Use  . Smoking status: Current Every Day Smoker    Packs/day: 1.00    Years: 15.00    Pack years: 15.00    Types: Cigarettes  . Smokeless tobacco: Current User  Vaping Use  . Vaping Use: Some days  .  Substances: Nicotine  Substance and Sexual Activity  . Alcohol use: Not Currently  . Drug use: Not Currently    Types: Cocaine, Marijuana    Comment: not since 07-2018  . Sexual activity: Yes

## 2019-10-07 ENCOUNTER — Encounter: Payer: No Typology Code available for payment source | Admitting: Physical Therapy

## 2019-10-11 ENCOUNTER — Encounter: Payer: No Typology Code available for payment source | Admitting: Physical Therapy

## 2019-10-11 DIAGNOSIS — Z0289 Encounter for other administrative examinations: Secondary | ICD-10-CM

## 2019-10-12 ENCOUNTER — Ambulatory Visit: Payer: No Typology Code available for payment source | Admitting: Orthopaedic Surgery

## 2019-10-12 NOTE — Telephone Encounter (Signed)
Sent a New Urgent referral to  [facility] ph# [phone]   address [address] They will contact patient to schedule an appointment.

## 2019-10-12 NOTE — Telephone Encounter (Signed)
Jimmy [name] called in to check on the referral for for the therapist.  They stated they have not heard anything as of yet.   Also the dermo dr that pt was referred  To stated that they could get him in til next year. He is developing  more and more of the knots and wants to be seen sooner Please advise

## 2019-10-13 ENCOUNTER — Other Ambulatory Visit: Payer: Self-pay

## 2019-10-13 ENCOUNTER — Ambulatory Visit (INDEPENDENT_AMBULATORY_CARE_PROVIDER_SITE_OTHER): Payer: No Typology Code available for payment source | Admitting: Physical Therapy

## 2019-10-13 ENCOUNTER — Encounter: Payer: Self-pay | Admitting: Physical Therapy

## 2019-10-13 DIAGNOSIS — M25511 Pain in right shoulder: Secondary | ICD-10-CM

## 2019-10-13 DIAGNOSIS — M25572 Pain in left ankle and joints of left foot: Secondary | ICD-10-CM

## 2019-10-14 NOTE — Therapy (Signed)
[place], [name], [address] Phone: [phone]   Fax:  [phone]  Physical Therapy Treatment/Re-Eval  Re-eval for adding dx of L anke pain  Patient Details  Name: Jimmy [name] MRN: [id number] Date of Birth: [birth year] Referring Provider (PT): Lynne Leader   Encounter Date: 10/13/2019   PT End of Session - 10/14/19 0911    Visit Number 2    Number of Visits 12    Date for PT Re-Evaluation 11/08/19    Authorization Type [employer]    PT Start Time 0802    PT Stop Time 0843    PT Time Calculation (min) 41 min    Activity Tolerance Patient tolerated treatment well    Behavior During Therapy [facility] for tasks assessed/performed           Past Medical History:  Diagnosis Date  . ADHD (attention deficit hyperactivity disorder)   . Anxiety   . Arthritis   . Cancer ([place])    skin cancer  . Chest pain 08/2018  . GERD (gastroesophageal reflux disease)    no meds  . Migraine    migraines due to pinched nerve  . Pinched nerve in neck   . Pinched nerve in neck   . Scoliosis     Past Surgical History:  Procedure Laterality Date  . ESOPHAGUS SURGERY     as a baby  . SHOULDER ARTHROSCOPY WITH SUBACROMIAL DECOMPRESSION Right 09/01/2019   Procedure: RIGHT SHOULDER ARTHROSCOPY WITH EXTENSIVE DEBRIDEMENT AND SUBACROMIAL DECOMPRESSION;  Surgeon: Leandrew Koyanagi, MD;  Location: [place];  Service: Orthopedics;  Laterality: Right;  . VENTRAL HERNIA REPAIR N/A 08/31/2018   Procedure: HERNIA REPAIR VENTRAL ADULT;  Surgeon: Olean Ree, MD;  Location: [facility];  Service: General;  Laterality: N/A;    There were no vitals filed for this visit.   Subjective Assessment - 10/13/19 0800    Subjective Pt states R shoulder sore posteriorly. He is going to have surgery for L shoulder, not scheduled yet. He also has had sprain of L ankle, states still sore and swollen. He has new referral for L ankle as well. He is  still working, light duty. Had L ankle injury on 7/14. Now wearing brace under work boots.    Currently in Pain? Yes    Pain Score 8     Pain Location Ankle    Pain Orientation Left    Pain Descriptors / Indicators Aching;Sharp    Pain Type Acute pain    Pain Onset More than a month ago    Pain Frequency Intermittent    Aggravating Factors  standing, walking, weight bearing.    Pain Score 4    Pain Location Shoulder    Pain Orientation Right    Pain Descriptors / Indicators Aching    Pain Type Acute pain    Pain Onset More than a month ago    Pain Frequency Intermittent              [facility] PT Assessment - 10/14/19 0001      Assessment   Medical Diagnosis L ankle pain    Referring Provider (PT) Lynne Leader    Onset Date/Surgical Date 09/29/19    Prior Therapy seeing for shoulder as well.       Precautions   Precaution Comments Step off 3 ft high , Rolled L ankle, 09/29/19      AROM   Overall AROM Comments L ankle: Mild limitation  for DF      Strength   Overall Strength Comments L ankle: 4/5 PF/DF;  4-/5 Inv/Ev      Special Tests   Other special tests SLS: 8 sec, mod sway/pain                         [facility] Adult PT Treatment/Exercise - 10/13/19 0806      Exercises   Exercises Shoulder;Ankle      Neck Exercises: Theraband   Rows --    Shoulder External Rotation --    Shoulder Internal Rotation --    Horizontal ABduction --      Shoulder Exercises: Supine   External Rotation 15 reps    External Rotation Weight (lbs) 2    Flexion AAROM;20 reps    Flexion Limitations cane      Shoulder Exercises: Prone   Other Prone Exercises Prone I, T, x 15 on R;       Shoulder Exercises: Sidelying   External Rotation --      Shoulder Exercises: Standing   Horizontal ABduction 15 reps    Theraband Level (Shoulder Horizontal ABduction) Level 3 (Green)    External Rotation 15 reps    Theraband Level (Shoulder External Rotation) Level 3 (Green)    Internal  Rotation 15 reps    Theraband Level (Shoulder Internal Rotation) Level 3 (Green)    Row 20 reps    Theraband Level (Shoulder Row) Level 4 (Blue)    Other Standing Exercises Wall push ups x 15;  Ball circles at wall for scap stabs x 20;       Shoulder Exercises: Pulleys   Flexion --      Shoulder Exercises: ROM/Strengthening   UBE (Upper Arm Bike) L1 x 4 min      Ankle Exercises: Stretches   Gastroc Stretch 2 reps;30 seconds    Gastroc Stretch Limitations at wall      Ankle Exercises: Seated   Heel Raises 20 reps      Ankle Exercises: Supine   Other Supine Ankle Exercises PF/DF/INV/EV AROM x 15 eac                  PT Education - 10/14/19 0910    Education Details Educated on HEP for ankle, Discussed brace wearing, shoe recommendation, and need to sit/rest at work if able.    Person(s) Educated Patient    Methods Explanation;Demonstration;Verbal cues;Handout    Comprehension Verbalized understanding;Returned demonstration;Verbal cues required;Need further instruction            PT Short Term Goals - 09/27/19 2153      PT SHORT TERM GOAL #1   Title Pt to be independent with initial HEP    Time 2    Period Weeks    Status New    Target Date 10/11/19             PT Long Term Goals - 10/14/19 0911      PT LONG TERM GOAL #1   Title Pt to be independent with final HEP    Time 6    Period Weeks    Status On-going    Target Date 11/08/19      PT LONG TERM GOAL #2   Title Pt to repot decreased pain in R shoulder to 0-2/10 with elevation and daily activities.    Time 6    Period Weeks    Status On-going    Target Date 11/08/19  PT LONG TERM GOAL #3   Title Pt to demo improved strength of bil shoulders, and postural muscles, to be 5/5, to improve ability for reaching, lifing, carrying, and work duties.    Time 6    Period Weeks    Status On-going    Target Date 11/08/19      PT LONG TERM GOAL #4   Title Pt to report decreased pain in L ankle, to  0-2/10 with standing , walking, and work duties.    Time 6    Period Weeks    Status New    Target Date 11/08/19      PT LONG TERM GOAL #5   Title Pt to demo strength and stability/[facility] of L ankle to be WNL for pt age, to improve gait, and functional activities.    Time 6    Period Weeks    Status New    Target Date 11/08/19                 Plan - 10/14/19 0914    Clinical Impression Statement Pt with most pain in R UT region today for shoulder. DN done to address tightness and trigger points, will assess effects next visit. Pt able to progress strengthening without increased pain today. Pt notes feeling of weakness in R hand, but strength equal , 100lb, in bil hands. L ankle has only mild swelling today, but painful anterior/laterally with palpation and with weight bearing. Minimal ROM loss, mild strength deficits, and stability deficits. Will add in L ankle Dx as well for treatment. Pt to benefit from skilled PT for L ankle as well, for improving pain, stability, strength, and function.    Comorbidities UE numbness /tingling on L,    Examination-Activity Limitations Locomotion Level;Reach Overhead;Carry;Lift;Bathing;Sleep    Examination-Participation Restrictions Cleaning;Community Activity;Driving;Shop;Laundry;Yard Work;Meal Prep    Stability/Clinical Decision Making Stable/Uncomplicated    Rehab Potential Good    PT Frequency 2x / week    PT Duration 6 weeks    PT Treatment/Interventions ADLs/Self Care Home Management;Cryotherapy;Electrical Stimulation;DME Instruction;Ultrasound;Traction;Moist Heat;Iontophoresis 4mg /ml Dexamethasone;Functional mobility training;Therapeutic activities;Therapeutic exercise;Neuromuscular re-education;Manual techniques;Patient/family education;Passive range of motion;Dry needling;Energy conservation;Taping;Visual/perceptual remediation/compensation;Spinal Manipulations;Joint Manipulations    Consulted and Agree with Plan of Care Patient            Patient will benefit from skilled therapeutic intervention in order to improve the following deficits and impairments:  Decreased range of motion, Impaired UE functional use, Increased muscle spasms, Decreased activity tolerance, Pain, Improper body mechanics, Decreased strength, Decreased mobility  Visit Diagnosis: Acute pain of right shoulder  Pain in left ankle and joints of left foot     Problem List Patient Active Problem List   Diagnosis Date Noted  . 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 Carpal tunnel syndrome 09/08/2019  . Bursitis of shoulder, right 08/31/2019  . Incisional hernia, without obstruction or gangrene 05/20/2018  . Opiate dependence ([place]) 08/02/2013    Lyndee Hensen, PT, DPT 9:23 AM  10/14/19    [place], [name], [address] Phone: [phone]   Fax:  [phone]  Name: ANDRIAN SABALA MRN: [id number] Date of Birth: 06/15/1990

## 2019-10-18 ENCOUNTER — Encounter: Payer: No Typology Code available for payment source | Admitting: Physical Therapy

## 2019-10-19 ENCOUNTER — Telehealth: Payer: Self-pay | Admitting: Family Medicine

## 2019-10-19 ENCOUNTER — Other Ambulatory Visit: Payer: Self-pay

## 2019-10-19 ENCOUNTER — Ambulatory Visit (INDEPENDENT_AMBULATORY_CARE_PROVIDER_SITE_OTHER): Payer: No Typology Code available for payment source | Admitting: Family Medicine

## 2019-10-19 VITALS — Ht 64.0 in | Wt 163.6 lb

## 2019-10-19 DIAGNOSIS — S93492D Sprain of other ligament of left ankle, subsequent encounter: Secondary | ICD-10-CM

## 2019-10-19 NOTE — Patient Instructions (Signed)
Thank you for coming in today. Continue PT and home exercise.  Continue the brace.  Advance activity as tolerated.  Try aleve 2 pills up to 2x daily for pain and swelling.  Recheck in 1 month.

## 2019-10-19 NOTE — Telephone Encounter (Signed)
Patient had [place] yesterday at [facility]. Notes are in epic.

## 2019-10-19 NOTE — Progress Notes (Signed)
   I, Wendy Poet, LAT, ATC, am serving as scribe for Dr. Lynne Leader.  Jimmy [name] is a 29 y.o. male who presents to [place] at [facility] today for f/u of a L ankle sprain that he sustained on 09/29/19.  He was last seen by Dr. Georgina Snell on 09/30/19 and was advised to wear a CAM walker boot and to limit weight-bearing through his L LE as much as possible by using one crutch to avoid damage to his recent R shoulder labrum repair.  Since his last visit, pt reports that his L ankle is better but con't to hurt a lot, particularly from his L lateral ankle to his ant ankle.  He states that his L ankle hurts more at the end of the day after being on his feet at work.  He reports increased ankle swelling at the end of the day.  He has been wearing a L ankle brace while working.  He recently had a diagnostic ultrasound of his abdomen showing mild steatohepatitis.  He is concerned about taking Tylenol and ibuprofen as it may worsen his liver disease.  Diagnostic testing: L ankle XR- 09/29/19  Pertinent review of systems: No fevers or chills  Relevant historical information: Family history cirrhosis   Exam:  Ht 5\' 4"  (1.626 m)   Wt 163 lb 9.6 oz (74.2 kg)   BMI 28.08 kg/m  General: Well Developed, well nourished, and in no acute distress.   MSK: Left ankle small effusion tender palpation ATFL and syndesmosis region. Improving ankle motion.  Stable ligamentous exam.  Normal strength.    Assessment and Plan: 29 y.o. male with left ankle sprain.  Likely high ankle involvement as well.  Plan for continued physical therapy and advancing activity as tolerated.  Patient is functionally much improved compared to last visit now able to ambulate without a crutch and is transition to a regular ankle brace.  Recommend Aleve.  He should be able to take this twice daily with little worry about liver concerns.  Recheck back with me in a month.     Discussed warning signs or symptoms.  Please see discharge instructions. Patient expresses understanding.   The above documentation has been reviewed and is accurate and complete Lynne Leader, M.D.

## 2019-10-19 NOTE — Telephone Encounter (Signed)
Patient called to speak with the nurse or doctor regarding the ultrasound results which showed fatty tissue on his liver.  Please call patient to discuss at [phone]

## 2019-10-20 ENCOUNTER — Ambulatory Visit: Payer: No Typology Code available for payment source | Admitting: Family Medicine

## 2019-10-20 ENCOUNTER — Encounter: Payer: No Typology Code available for payment source | Admitting: Physical Therapy

## 2019-10-20 NOTE — Telephone Encounter (Signed)
I am not able to see the notes of the clinician who ordered the ultrasound and have no access to the visit encounter but he must have had some discussion at his visit with the clinician.  Ultrasound does show fatty liver and this can be improved upon by intake of low-cholesterol fluid, weight loss, exercise.

## 2019-10-20 NOTE — Telephone Encounter (Signed)
Patient was called and set up a televist with PCP to discuss concerns.

## 2019-10-21 ENCOUNTER — Other Ambulatory Visit: Payer: Self-pay

## 2019-10-21 ENCOUNTER — Ambulatory Visit: Payer: No Typology Code available for payment source | Attending: Family Medicine | Admitting: Family Medicine

## 2019-10-21 DIAGNOSIS — R1011 Right upper quadrant pain: Secondary | ICD-10-CM

## 2019-10-21 DIAGNOSIS — K760 Fatty (change of) liver, not elsewhere classified: Secondary | ICD-10-CM | POA: Diagnosis not present

## 2019-10-21 NOTE — Progress Notes (Signed)
Virtual Visit via Telephone Note  I connected with Jimmy [name], on 10/21/2019 at 8:58 AM by telephone due to the COVID-19 pandemic and verified that I am speaking with the correct person using two identifiers.   Consent: I discussed the limitations, risks, security and privacy concerns of performing an evaluation and management service by telephone and the availability of in person appointments. I also discussed with the patient that there may be a patient responsible charge related to this service. The patient expressed understanding and agreed to proceed.   Location of Patient: Work  Biomedical scientist of Provider: Clinic   Persons participating in Telemedicine visit: Jimmy [name]-CMA Dr. Margarita Rana     History of Present Illness: Jimmy [name] migraine headaches who presents today for follow-up visit. Complains of RUQ pain, loosing weight and reduced appetite for the last 2-3 months. Sometimes has diarrhea and at other times has nausea. Pain is present all the time and not related to eating certain foods. He had presented to an urgent care with the [facility] and informed he had a fatty liver.  Complete abdominal ultrasound from [facility] on 10/18/2019 revealed: Impression:  1. Mildly coarse liver, which may be seen with hepatic steatosis.   Would like to see a "liver specialist as this stuff runs in my family".  On inquiring about what 'stuff' he goes on to state liver problems and kidney problems.   Past Medical History:  Diagnosis Date  . ADHD (attention deficit hyperactivity disorder)   . Anxiety   . Arthritis   . Cancer ([place])    skin cancer  . Chest pain 08/2018  . GERD (gastroesophageal reflux disease)    no meds  . Migraine    migraines due to pinched nerve  . Pinched nerve in neck   . Pinched nerve in neck   . Scoliosis    Allergies  Allergen Reactions  . Cyclobenzaprine Hives and Nausea Only  . Depakote  [Divalproex Sodium] Other (See Comments)    "made him crazy"  . Ritalin [Methylphenidate Hcl] Other (See Comments)    Increased hyper activeness   . Naproxen Nausea Only    Acid reflux  . Other Other (See Comments)    [facility] states skin became reddened and he tasted metal (08-2018)  . Tramadol Other (See Comments)    Acid reflux    Current Outpatient Medications on File Prior to Visit  Medication Sig Dispense Refill  . Erenumab-aooe (AIMOVIG) 70 MG/ML SOAJ Inject 70 mg into the skin every 28 (twenty-eight) days. 1 pen 11  . gabapentin (NEURONTIN) 300 MG capsule TAKE 1 CAPSULE BY MOUTH AT BEDTIME FOR 7 DAYS 1 CAPSULE TWICE DAILY FOR 7 DAYS 1 CAPSULE THREE TIMES DAILY MAY DOUBLE WEEKLY TO A MAX OF 360    . nicotine (NICODERM CQ) 14 mg/24hr patch Place 1 patch (14 mg total) onto the skin daily. 28 patch 1  . QUEtiapine (SEROQUEL) 100 MG tablet Take 1 tablet (100 mg total) by mouth at bedtime. 30 tablet 6  . meloxicam (MOBIC) 15 MG tablet Take 1 tablet (15 mg total) by mouth daily. (Patient not taking: Reported on 10/19/2019) 20 tablet 0  . [DISCONTINUED] famotidine (PEPCID) 40 MG tablet Take 1 tablet (40 mg total) by mouth daily. 90 tablet 1   No current facility-administered medications on file prior to visit.    Observations/Objective: Awake, alert, oriented x3 Not in acute distress  Assessment and Plan: 1. Right upper quadrant  abdominal pain Patient requests GI referral which I have placed - Ambulatory referral to Gastroenterology  2. Hepatic steatosis Discussed measures to curtail hepatic steatosis including low-cholesterol diet, weight loss and exercise - Ambulatory referral to Gastroenterology   Follow Up Instructions: Keep previously scheduled appointment   I discussed the assessment and treatment plan with the patient. The patient was provided an opportunity to ask questions and all were answered. The patient agreed with the plan and demonstrated an understanding of the  instructions.   The patient was advised to call back or seek an in-person evaluation if the symptoms worsen or if the condition fails to improve as anticipated.     I provided 13 minutes total of non-face-to-face time during this encounter including median intraservice time, reviewing previous notes, investigations, ordering medications, medical decision making, coordinating care and patient verbalized understanding at the end of the visit.     Charlott Rakes, MD, FAAFP. [facility] and [facility] [place], [place]   10/21/2019, 8:58 AM

## 2019-10-21 NOTE — Progress Notes (Signed)
Patient is wanting discuss his fatty liver, states that he would like further evaluation due to his family history of cancer.  Having pain in right side of abdomen very bad.

## 2019-10-22 ENCOUNTER — Telehealth: Payer: Self-pay | Admitting: Gastroenterology

## 2019-10-22 NOTE — Telephone Encounter (Signed)
Hey Dr. Rush Landmark, this patient is being referred to [place] for RUQ pain and Hepatic steatosis. He was previously seen at [facility] in June of this year. He did not specify why he wanted to transfer care to [place] just that he did. You are the DOD from 08/05 PM. The records are epic. Please advise. Thank you!

## 2019-10-25 ENCOUNTER — Encounter: Payer: No Typology Code available for payment source | Admitting: Physical Therapy

## 2019-10-25 NOTE — Telephone Encounter (Signed)
The patient has already been seen at a quaternary center for the evaluation of this discomfort. Based on the physical exam findings, it was felt that this was more likely to be musculoskeletal rather than GI related. With that being said, I am certainly willing to evaluate the patient but their expertise may surpass what we can offer here in [place]. We need the endoscopic reports of his EGD/colonoscopy (even though it says they were done in epic and we "can see them we cannot see them completely.") New patient visit okay on a normal schedule clinic today. Thanks. GM

## 2019-10-27 ENCOUNTER — Encounter: Payer: Self-pay | Admitting: Gastroenterology

## 2019-10-27 ENCOUNTER — Encounter: Payer: No Typology Code available for payment source | Admitting: Physical Therapy

## 2019-10-27 NOTE — Telephone Encounter (Signed)
Patient is scheduled on 10/12, he is going to try and get the records for last procedures sent.

## 2019-10-29 ENCOUNTER — Encounter: Payer: No Typology Code available for payment source | Admitting: Physical Therapy

## 2019-11-01 ENCOUNTER — Ambulatory Visit (INDEPENDENT_AMBULATORY_CARE_PROVIDER_SITE_OTHER): Payer: No Typology Code available for payment source | Admitting: Physical Therapy

## 2019-11-01 ENCOUNTER — Other Ambulatory Visit: Payer: Self-pay

## 2019-11-01 ENCOUNTER — Encounter: Payer: Self-pay | Admitting: Physical Therapy

## 2019-11-01 DIAGNOSIS — M25572 Pain in left ankle and joints of left foot: Secondary | ICD-10-CM | POA: Diagnosis not present

## 2019-11-01 DIAGNOSIS — M25511 Pain in right shoulder: Secondary | ICD-10-CM

## 2019-11-01 NOTE — Patient Instructions (Signed)
Access Code: [email or web address] URL: [email or web address] Date: 11/01/2019 Prepared by: Lyndee Hensen  Exercises Standing Row with Anchored Resistance - 2 x daily - 2 sets - 10 reps Shoulder External Rotation with Anchored Resistance - 1 x daily - 2 sets - 10 reps Shoulder Internal Rotation with Resistance - 1 x daily - 2 sets - 10 reps Standing Shoulder Horizontal Abduction with Resistance - 1 x daily - 2 sets - 10 reps Supine Shoulder Flexion with Dowel - 1-2 x daily - 2 sets - 10 reps Tricep Push Up on Wall - 1 x daily - 2 sets - 10 reps Full Plank - 2 x daily - 3 reps - 30 hold Prone Scapular Retraction Arms at Side - 1 x daily - 2 sets - 10 reps Prone Scapular Slide with Shoulder Extension - 1 x daily - 2 sets - 10 reps Ankle Eversion with Resistance - 1 x daily - 2 sets - 10 reps Ankle Dorsiflexion with Resistance - 1 x daily - 2 sets - 10 reps Standing Heel Raise - 1 x daily - 2 sets - 10 reps Single Leg Stance - 1 x daily - 2 sets - 10 reps

## 2019-11-01 NOTE — Therapy (Signed)
[facility] [address] [place], [name], [address] Phone: [phone]   Fax:  [phone]  Physical Therapy Treatment/Discharge   Patient Details  Name: Jimmy [name] MRN: [id number] Date of Birth: 1990-04-08 Referring Provider (PT): Lynne Leader   Encounter Date: 11/01/2019   PT End of Session - 11/01/19 0855    Visit Number 3    Number of Visits 12    Date for PT Re-Evaluation 11/08/19    Authorization Type [employer]    PT Start Time 0845    PT Stop Time 0927    PT Time Calculation (min) 42 min    Activity Tolerance Patient tolerated treatment well    Behavior During Therapy [facility] for tasks assessed/performed           Past Medical History:  Diagnosis Date  . ADHD (attention deficit hyperactivity disorder)   . Anxiety   . Arthritis   . Cancer ([place])    skin cancer  . Chest pain 08/2018  . GERD (gastroesophageal reflux disease)    no meds  . Migraine    migraines due to pinched nerve  . Pinched nerve in neck   . Pinched nerve in neck   . Scoliosis     Past Surgical History:  Procedure Laterality Date  . ESOPHAGUS SURGERY     as a baby  . SHOULDER ARTHROSCOPY WITH SUBACROMIAL DECOMPRESSION Right 09/01/2019   Procedure: RIGHT SHOULDER ARTHROSCOPY WITH EXTENSIVE DEBRIDEMENT AND SUBACROMIAL DECOMPRESSION;  Surgeon: Leandrew Koyanagi, MD;  Location: [facility];  Service: Orthopedics;  Laterality: Right;  . VENTRAL HERNIA REPAIR N/A 08/31/2018   Procedure: HERNIA REPAIR VENTRAL ADULT;  Surgeon: Olean Ree, MD;  Location: [facility];  Service: General;  Laterality: N/A;    There were no vitals filed for this visit.   Subjective Assessment - 11/01/19 0849    Subjective pt has L shoulder surgery scheduled for Dec 15th. Has been working, still on light duty. States doing much better with R shoulder,  no pain on the R. No numbness/tingling into R hand. States decreased pain in UT since last session. Feels stronger in  R hand this week. Still trying not to over do it. Has mild soreness in lateral ankle with increased activity and standing    Currently in Pain? Yes    Pain Score 0-No pain    Pain Location Shoulder    Pain Orientation Right    Pain Score 2    Pain Location Ankle    Pain Orientation Left    Pain Descriptors / Indicators Aching    Pain Type Acute pain    Pain Onset More than a month ago    Pain Frequency Intermittent                             [facility] Adult PT Treatment/Exercise - 11/01/19 0856      Exercises   Exercises Shoulder;Ankle      Shoulder Exercises: Supine   External Rotation 15 reps    External Rotation Weight (lbs) 2    Flexion AAROM;20 reps    Flexion Limitations cane      Shoulder Exercises: Prone   Other Prone Exercises Prone I, T, x 15 bilateral    Other Prone Exercises High Plank 30 sec x 4 ;       Shoulder Exercises: Standing   Horizontal ABduction 15 reps    Theraband Level (Shoulder Horizontal  ABduction) Level 3 (Green)    External Rotation 15 reps    Theraband Level (Shoulder External Rotation) Level 3 (Green)    Internal Rotation 15 reps    Theraband Level (Shoulder Internal Rotation) Level 3 (Green)    Row 20 reps    Theraband Level (Shoulder Row) Level 4 (Blue)    Other Standing Exercises Wall push ups x 15;        Shoulder Exercises: ROM/Strengthening   UBE (Upper Arm Bike) L1 x 5 min      Ankle Exercises: Stretches   Gastroc Stretch --    Gastroc Stretch Limitations --      Ankle Exercises: Standing   SLS 30 sec x 3 bil with head turns     Heel Raises 20 reps    Heel Raises Limitations no shoes       Ankle Exercises: Seated   Heel Raises --      Ankle Exercises: Supine   Other Supine Ankle Exercises PF and Ev with RTB x 20 each                   PT Education - 11/01/19 0855    Education Details HEP updated today    Person(s) Educated Patient    Methods Explanation;Demonstration;Handout;Verbal cues     Comprehension Verbalized understanding;Returned demonstration;Verbal cues required;Need further instruction            PT Short Term Goals - 11/01/19 0856      PT SHORT TERM GOAL #1   Title Pt to be independent with initial HEP    Time 2    Period Weeks    Status Achieved    Target Date 10/11/19             PT Long Term Goals - 11/01/19 0949      PT LONG TERM GOAL #1   Title Pt to be independent with final HEP    Time 6    Period Weeks    Status Achieved      PT LONG TERM GOAL #2   Title Pt to repot decreased pain in R shoulder to 0-2/10 with elevation and daily activities.    Time 6    Period Weeks    Status Achieved      PT LONG TERM GOAL #3   Title Pt to demo improved strength of bil shoulders, and postural muscles, to be 5/5, to improve ability for reaching, lifing, carrying, and work duties.    Baseline Up to 4+/5  to 5/5 in R shoulder    Time 6    Period Weeks    Status Partially Met      PT LONG TERM GOAL #4   Title Pt to report decreased pain in L ankle, to 0-2/10 with standing , walking, and work duties.    Baseline Anke still hurts 2/10 at times    Time 6    Period Weeks    Status Partially Met      PT LONG TERM GOAL #5   Title Pt to demo strength and stability/[facility] of L ankle to be WNL for pt age, to improve gait, and functional activities.    Time 6    Period Weeks    Status Achieved                 Plan - 11/01/19 0949    Clinical Impression Statement Pt has made good progress with R shoulder. He states no pain. Pt with  much improved ability for strengthening and ther ex. ROM and strength WNL. Final HEP reviewed today, discussed need for continued strengthening. Does have mild pain in L lateral ankle with palpation, reviewed best strengthening exercises, and pt will continue to f/u with MD for this. Ankle is showing good progress as well, but still mild soreness. Pt has met goals at this time, will be discharged today. Pt has had very poor  attendance and has only come to 3 appointments.    Comorbidities UE numbness /tingling on L,    Examination-Activity Limitations Locomotion Level;Reach Overhead;Carry;Lift;Bathing;Sleep    Examination-Participation Restrictions Cleaning;Community Activity;Driving;Shop;Laundry;Yard Work;Meal Prep    Stability/Clinical Decision Making Stable/Uncomplicated    Rehab Potential Good    PT Frequency 2x / week    PT Duration 6 weeks    PT Treatment/Interventions ADLs/Self Care Home Management;Cryotherapy;Electrical Stimulation;DME Instruction;Ultrasound;Traction;Moist Heat;Iontophoresis 29m/ml Dexamethasone;Functional mobility training;Therapeutic activities;Therapeutic exercise;Neuromuscular re-education;Manual techniques;Patient/family education;Passive range of motion;Dry needling;Energy conservation;Taping;Visual/perceptual remediation/compensation;Spinal Manipulations;Joint Manipulations    Consulted and Agree with Plan of Care Patient           Patient will benefit from skilled therapeutic intervention in order to improve the following deficits and impairments:  Decreased range of motion, Impaired UE functional use, Increased muscle spasms, Decreased activity tolerance, Pain, Improper body mechanics, Decreased strength, Decreased mobility  Visit Diagnosis: Acute pain of right shoulder  Pain in left ankle and joints of left foot     Problem List Patient Active Problem List   Diagnosis Date Noted  . 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 Carpal tunnel syndrome 09/08/2019  . Bursitis of shoulder, right 08/31/2019  . Incisional hernia, without obstruction or gangrene 05/20/2018  . [place]piate dependence ([place] 08/02/2013    LLyndee Hensen PT, DPT 9:55 AM  11/01/19     [facility] [address]Phone: [phone]  Fax:  [phone] Name: MJOSEL KEOMRN:  [id number] of Birth: [birth year]    PHYSICAL THERAPY DISCHARGE SUMMARY  Visits from Start of Care: 3 Plan: Patient agrees to discharge.  Patient goals were met. Patient is being discharged due to meeting the stated rehab goals.  ?????     LLyndee Hensen PT, DPT 9:55 AM  11/01/19

## 2019-11-03 ENCOUNTER — Encounter: Payer: No Typology Code available for payment source | Admitting: Physical Therapy

## 2019-11-09 ENCOUNTER — Encounter: Payer: No Typology Code available for payment source | Admitting: Physical Therapy

## 2019-11-11 ENCOUNTER — Encounter: Payer: No Typology Code available for payment source | Admitting: Physical Therapy

## 2019-11-14 ENCOUNTER — Other Ambulatory Visit: Payer: Self-pay

## 2019-11-14 ENCOUNTER — Ambulatory Visit
Admission: EM | Admit: 2019-11-14 | Discharge: 2019-11-14 | Disposition: A | Discharge status: Home / Self Care | Payer: No Typology Code available for payment source | Attending: Emergency Medicine | Admitting: Emergency Medicine

## 2019-11-14 ENCOUNTER — Encounter: Payer: Self-pay | Admitting: Emergency Medicine

## 2019-11-14 DIAGNOSIS — Z20822 Contact with and (suspected) exposure to covid-19: Secondary | ICD-10-CM

## 2019-11-14 DIAGNOSIS — M25511 Pain in right shoulder: Secondary | ICD-10-CM

## 2019-11-14 MED ORDER — ALBUTEROL SULFATE HFA 108 (90 BASE) MCG/ACT IN AERS: 1.0000 | INHALATION_SPRAY | Freq: Four times a day (QID) | RESPIRATORY_TRACT | 0 refills | Status: DC | PRN

## 2019-11-14 MED ORDER — BENZONATATE 100 MG PO CAPS
100.0000 mg | ORAL_CAPSULE | Freq: Three times a day (TID) | ORAL | 0 refills | Status: DC
Start: 2019-11-14 — End: 2019-12-28

## 2019-11-14 MED ORDER — PREDNISONE 10 MG PO TABS
20.0000 mg | ORAL_TABLET | Freq: Every day | ORAL | 0 refills | Status: DC
Start: 2019-11-14 — End: 2019-12-28

## 2019-11-14 NOTE — ED Provider Notes (Signed)
[facility]   [id number] 11/14/19 Arrival Time: 0811   Chief Complaint  Patient presents with  . Shoulder Pain        COVID exposure  SUBJECTIVE: History from: patient.  Jimmy [name] is a 29 y.o. male who presents to the urgent care for complaint of Covid exposure and mild shortness of breath last night.  Denies sick exposure to COVID, flu or strep.  Denies recent travel.  Has OTC medication without relief.  Denies aggravating or alleviating factors.  Reports/ denies previous symptoms in the past.   Denies fever, chills, fatigue, sinus pain, rhinorrhea, sore throat,  wheezing, chest pain, nausea, changes in bowel or bladder habits.    He is also complaining of right shoulder pain for the past 2 weeks. Had debridement completed in July 2021 and therefore physical therapy on August 2021.  He localizes the pain to the right shoulder.  He describes the pain as constant and achy.  He has tried OTC medications without relief.  His symptoms are made worse with ROM.      ROS: As per HPI.  All other pertinent ROS negative.     Past Medical History:  Diagnosis Date  . ADHD (attention deficit hyperactivity disorder)   . Anxiety   . Arthritis   . Cancer ([place])    skin cancer  . Chest pain 08/2018  . GERD (gastroesophageal reflux disease)    no meds  . Migraine    migraines due to pinched nerve  . Pinched nerve in neck   . Pinched nerve in neck   . Scoliosis    Past Surgical History:  Procedure Laterality Date  . ESOPHAGUS SURGERY     as a baby  . SHOULDER ARTHROSCOPY WITH SUBACROMIAL DECOMPRESSION Right 09/01/2019   Procedure: RIGHT SHOULDER ARTHROSCOPY WITH EXTENSIVE DEBRIDEMENT AND SUBACROMIAL DECOMPRESSION;  Surgeon: Leandrew Koyanagi, MD;  Location: [facility];  Service: Orthopedics;  Laterality: Right;  . VENTRAL HERNIA REPAIR N/A 08/31/2018   Procedure: HERNIA REPAIR VENTRAL ADULT;  Surgeon: Olean Ree, MD;  Location: [facility];  Service: General;   Laterality: N/A;   Allergies  Allergen Reactions  . Cyclobenzaprine Hives and Nausea Only  . Depakote [Divalproex Sodium] Other (See Comments)    "made him crazy"  . Ritalin [Methylphenidate Hcl] Other (See Comments)    Increased hyper activeness   . Naproxen Nausea Only    Acid reflux  . Other Other (See Comments)    [facility] states skin became reddened and he tasted metal (08-2018)  . Tramadol Other (See Comments)    Acid reflux   No current facility-administered medications on file prior to encounter.   Current Outpatient Medications on File Prior to Encounter  Medication Sig Dispense Refill  . Erenumab-aooe (AIMOVIG) 70 MG/ML SOAJ Inject 70 mg into the skin every 28 (twenty-eight) days. 1 pen 11  . gabapentin (NEURONTIN) 300 MG capsule TAKE 1 CAPSULE BY MOUTH AT BEDTIME FOR 7 DAYS 1 CAPSULE TWICE DAILY FOR 7 DAYS 1 CAPSULE THREE TIMES DAILY MAY DOUBLE WEEKLY TO A MAX OF 360    . meloxicam (MOBIC) 15 MG tablet Take 1 tablet (15 mg total) by mouth daily. (Patient not taking: Reported on 10/19/2019) 20 tablet 0  . nicotine (NICODERM CQ) 14 mg/24hr patch Place 1 patch (14 mg total) onto the skin daily. 28 patch 1  . QUEtiapine (SEROQUEL) 100 MG tablet Take 1 tablet (100 mg total) by mouth at bedtime. 30 tablet 6  . [  DISCONTINUED] famotidine (PEPCID) 40 MG tablet Take 1 tablet (40 mg total) by mouth daily. 90 tablet 1   Social History   Socioeconomic History  . Marital status: Married    Spouse name: Not on file  . Number of children: Not on file  . Years of education: Not on file  . Highest education level: Not on file  Occupational History  . Not on file  Tobacco Use  . Smoking status: Current Every Day Smoker    Packs/day: 1.00    Years: 15.00    Pack years: 15.00    Types: Cigarettes  . Smokeless tobacco: Current User  Vaping Use  . Vaping Use: Some days  . Substances: Nicotine  Substance and Sexual Activity  . Alcohol use: Not Currently  . Drug use: Not Currently     Types: Cocaine, Marijuana    Comment: not since 07-2018  . Sexual activity: Yes  Other Topics Concern  . Not on file  Social History Narrative   Right handed   One story home    Drinks caffeine    Social Determinants of Health   Financial Resource Strain:   . Difficulty of Paying Living Expenses: Not on file  Food Insecurity:   . Worried About Charity fundraiser in the Last Year: Not on file  . Ran Out of Food in the Last Year: Not on file  Transportation Needs:   . Lack of Transportation (Medical): Not on file  . Lack of Transportation (Non-Medical): Not on file  Physical Activity:   . Days of Exercise per Week: Not on file  . Minutes of Exercise per Session: Not on file  Stress:   . Feeling of Stress : Not on file  Social Connections:   . Frequency of Communication with Friends and Family: Not on file  . Frequency of Social Gatherings with Friends and Family: Not on file  . Attends Religious Services: Not on file  . Active Member of Clubs or Organizations: Not on file  . Attends Archivist Meetings: Not on file  . Marital Status: Not on file  Intimate Partner Violence:   . Fear of Current or Ex-Partner: Not on file  . Emotionally Abused: Not on file  . Physically Abused: Not on file  . Sexually Abused: Not on file   Family History  Problem Relation Age of Onset  . Diabetes Mother   . Diabetes Maternal Grandmother   . Cancer Maternal Grandmother        unknown type. doing chemo  . Cancer Maternal Grandfather   . Diabetes Maternal Aunt     OBJECTIVE:  Vitals:   11/14/19 0829 11/14/19 0830  BP:  126/81  Pulse:  61  Resp:  19  Temp:  97.9 F (36.6 C)  TempSrc:  Oral  SpO2:  96%  Weight: 165 lb (74.8 kg)   Height: 5\' 4"  (1.626 m)      General appearance: alert; appears fatigued, but nontoxic; speaking in full sentences and tolerating own secretions HEENT: NCAT; Ears: EACs clear, TMs pearly gray; Eyes: PERRL.  EOM grossly intact. Sinuses:  nontender; Nose: nares patent without rhinorrhea, Throat: oropharynx clear, tonsils non erythematous or enlarged, uvula midline  Neck: supple without LAD Lungs: unlabored respirations, symmetrical air entry; cough: absent, mild; no respiratory distress; CTAB Heart: regular rate and rhythm.  Radial pulses 2+ symmetrical bilaterally Skin: warm and dry Musc: Right shoulder is without any obvious asymmetry or deformity when compared to the left  shoulder.  No surface trauma or ecchymoses present.  Tenderness to palpation.  Limited range of motion due to pain.  Neurovascular status intact. Psychological: alert and cooperative; normal mood and affect  LABS:  No results found for this or any previous visit (from the past 24 hour(s)).   ASSESSMENT & PLAN:  1. Close exposure to COVID-19 virus   2. Acute pain of left shoulder     Meds ordered this encounter  Medications  . benzonatate (TESSALON) 100 MG capsule    Sig: Take 1 capsule (100 mg total) by mouth every 8 (eight) hours.    Dispense:  30 capsule    Refill:  0  . predniSONE (DELTASONE) 10 MG tablet    Sig: Take 2 tablets (20 mg total) by mouth daily.    Dispense:  15 tablet    Refill:  0  . albuterol (VENTOLIN HFA) 108 (90 Base) MCG/ACT inhaler    Sig: Inhale 1-2 puffs into the lungs every 6 (six) hours as needed for wheezing or shortness of breath.    Dispense:  18 g    Refill:  0   Patient is stable at discharge.  COVID-19 test was completed.  ProAir, Tessalon and prednisone were prescribed.  He was advised to follow-up with orthopedic for further evaluation as a follow-up MRI may be needed.  Discharge instructions  Follow-up with PCP/orthopedic for further reevaluation May need a follow-up MRI  COVID testing ordered.  It will take between 4-7 days for test results.  Someone will contact you regarding abnormal results.    In the meantime: You should remain isolated in your home for 10 days from symptom onset AND greater than  72 hours after symptoms resolution (absence of fever without the use of fever-reducing medication and improvement in respiratory symptoms), whichever is longer Get plenty of rest and push fluids Tessalon Perles prescribed for cough ProAir prescribed for shortness of breath Low-dose prednisone was prescribed Use medications daily for symptom relief Use OTC medications like ibuprofen or tylenol as needed fever or pain Call or go to the ED if you have any new or worsening symptoms such as fever, worsening cough, shortness of breath, chest tightness, chest pain, turning blue, changes in mental status, etc...   Reviewed expectations re: course of current medical issues. Questions answered. Outlined signs and symptoms indicating need for more acute intervention. Patient verbalized understanding. After Visit Summary given.   Note: This document was prepared using Dragon voice recognition software and may include unintentional dictation errors.       Emerson Monte, [place] 11/14/19 [phone]

## 2019-11-14 NOTE — ED Triage Notes (Signed)
Pain to RT shoulder x 2 weeks. Denies any injury. Pt had surgery in June on this shoulder, has not contacted the surgeon.  Also exposed to covid w/ no s/s

## 2019-11-14 NOTE — Discharge Instructions (Addendum)
Follow-up with PCP/orthopedic for further reevaluation May need a follow-up MRI  COVID testing ordered.  It will take between 4-7 days for test results.  Someone will contact you regarding abnormal results.    In the meantime: You should remain isolated in your home for 10 days from symptom onset AND greater than 72 hours after symptoms resolution (absence of fever without the use of fever-reducing medication and improvement in respiratory symptoms), whichever is longer Get plenty of rest and push fluids Tessalon Perles prescribed for cough ProAir prescribed for shortness of breath Low-dose prednisone was prescribed Use medications daily for symptom relief Use OTC medications like ibuprofen or tylenol as needed fever or pain Call or go to the ED if you have any new or worsening symptoms such as fever, worsening cough, shortness of breath, chest tightness, chest pain, turning blue, changes in mental status, etc..Marland Kitchen

## 2019-11-15 ENCOUNTER — Encounter: Payer: No Typology Code available for payment source | Admitting: Physical Therapy

## 2019-11-15 LAB — NOVEL CORONAVIRUS, NAA: SARS-CoV-2, NAA: NOT DETECTED

## 2019-11-17 ENCOUNTER — Encounter: Payer: No Typology Code available for payment source | Admitting: Physical Therapy

## 2019-11-19 ENCOUNTER — Ambulatory Visit: Payer: No Typology Code available for payment source | Admitting: Family Medicine

## 2019-11-23 ENCOUNTER — Ambulatory Visit (INDEPENDENT_AMBULATORY_CARE_PROVIDER_SITE_OTHER): Payer: No Typology Code available for payment source

## 2019-11-23 ENCOUNTER — Ambulatory Visit (INDEPENDENT_AMBULATORY_CARE_PROVIDER_SITE_OTHER): Payer: No Typology Code available for payment source | Admitting: Family Medicine

## 2019-11-23 ENCOUNTER — Encounter: Payer: Self-pay | Admitting: Family Medicine

## 2019-11-23 ENCOUNTER — Ambulatory Visit: Payer: Self-pay

## 2019-11-23 ENCOUNTER — Ambulatory Visit (INDEPENDENT_AMBULATORY_CARE_PROVIDER_SITE_OTHER): Payer: No Typology Code available for payment source | Admitting: Orthopaedic Surgery

## 2019-11-23 ENCOUNTER — Other Ambulatory Visit: Payer: Self-pay

## 2019-11-23 DIAGNOSIS — Z7189 Other specified counseling: Secondary | ICD-10-CM | POA: Diagnosis not present

## 2019-11-23 DIAGNOSIS — S43432A Superior glenoid labrum lesion of left shoulder, initial encounter: Secondary | ICD-10-CM

## 2019-11-23 DIAGNOSIS — Z7185 Encounter for immunization safety counseling: Secondary | ICD-10-CM

## 2019-11-23 DIAGNOSIS — S93492D Sprain of other ligament of left ankle, subsequent encounter: Secondary | ICD-10-CM | POA: Diagnosis not present

## 2019-11-23 IMAGING — DX DG ANKLE COMPLETE 3+V*L*
3 series · 3 of 3 positions shown · non-contrast
Comparison: Ankle radiograph [DATE]

CLINICAL DATA: Left ankle pain. Ankle sprain 1 month ago,
persistent pain and swelling.

EXAM:
LEFT ANKLE COMPLETE - 3+ VIEW

[ankle ap]
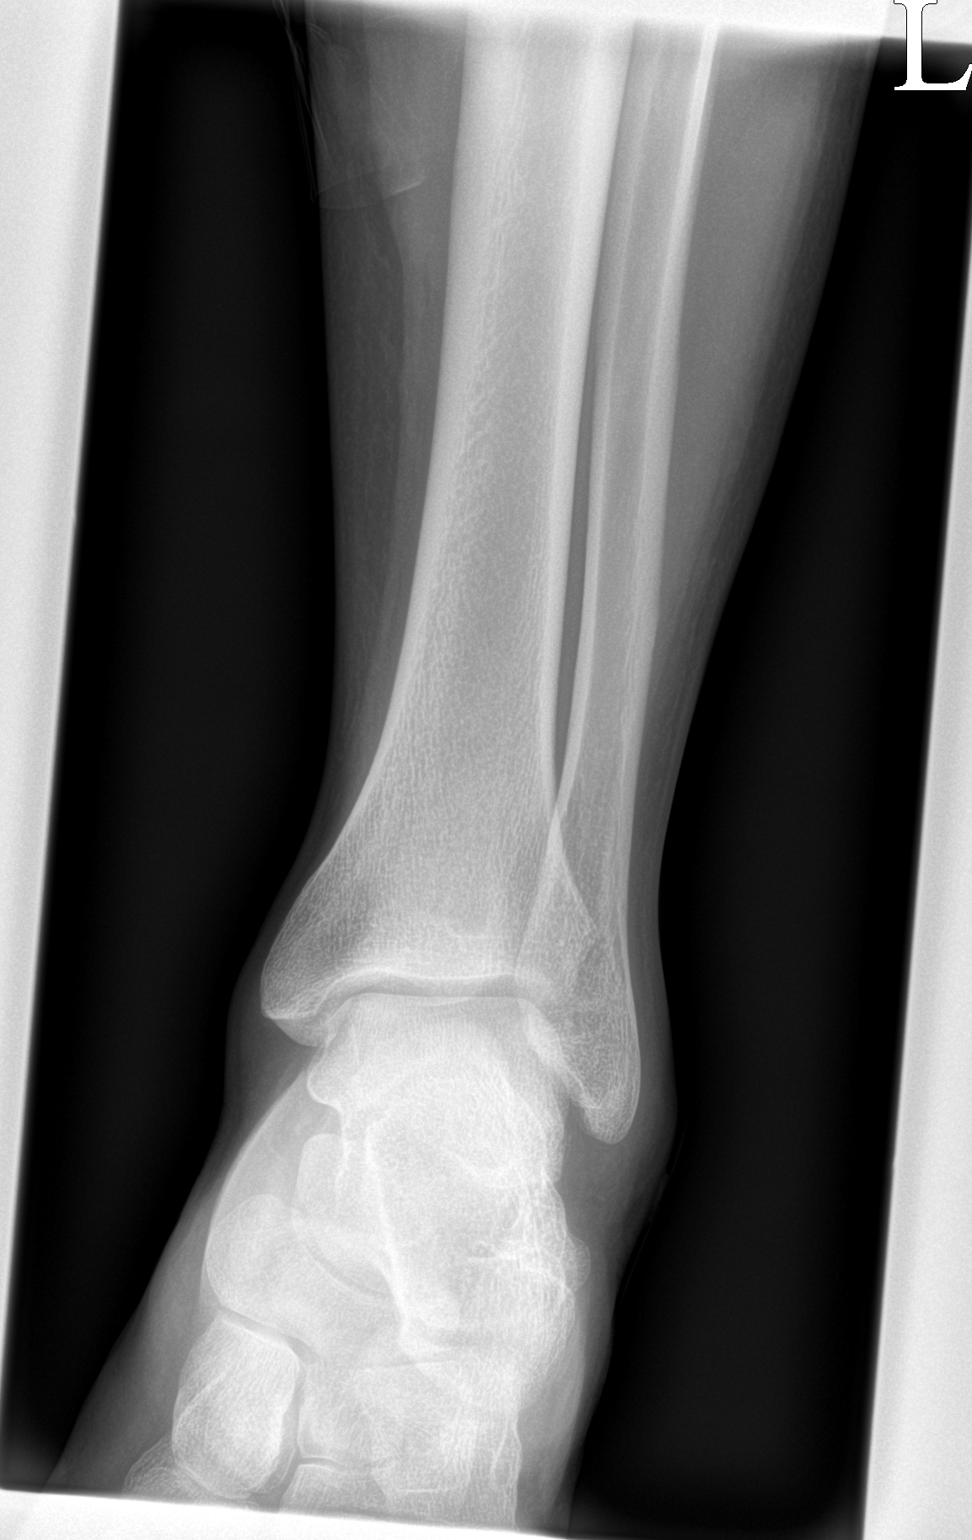

[ankle obl]
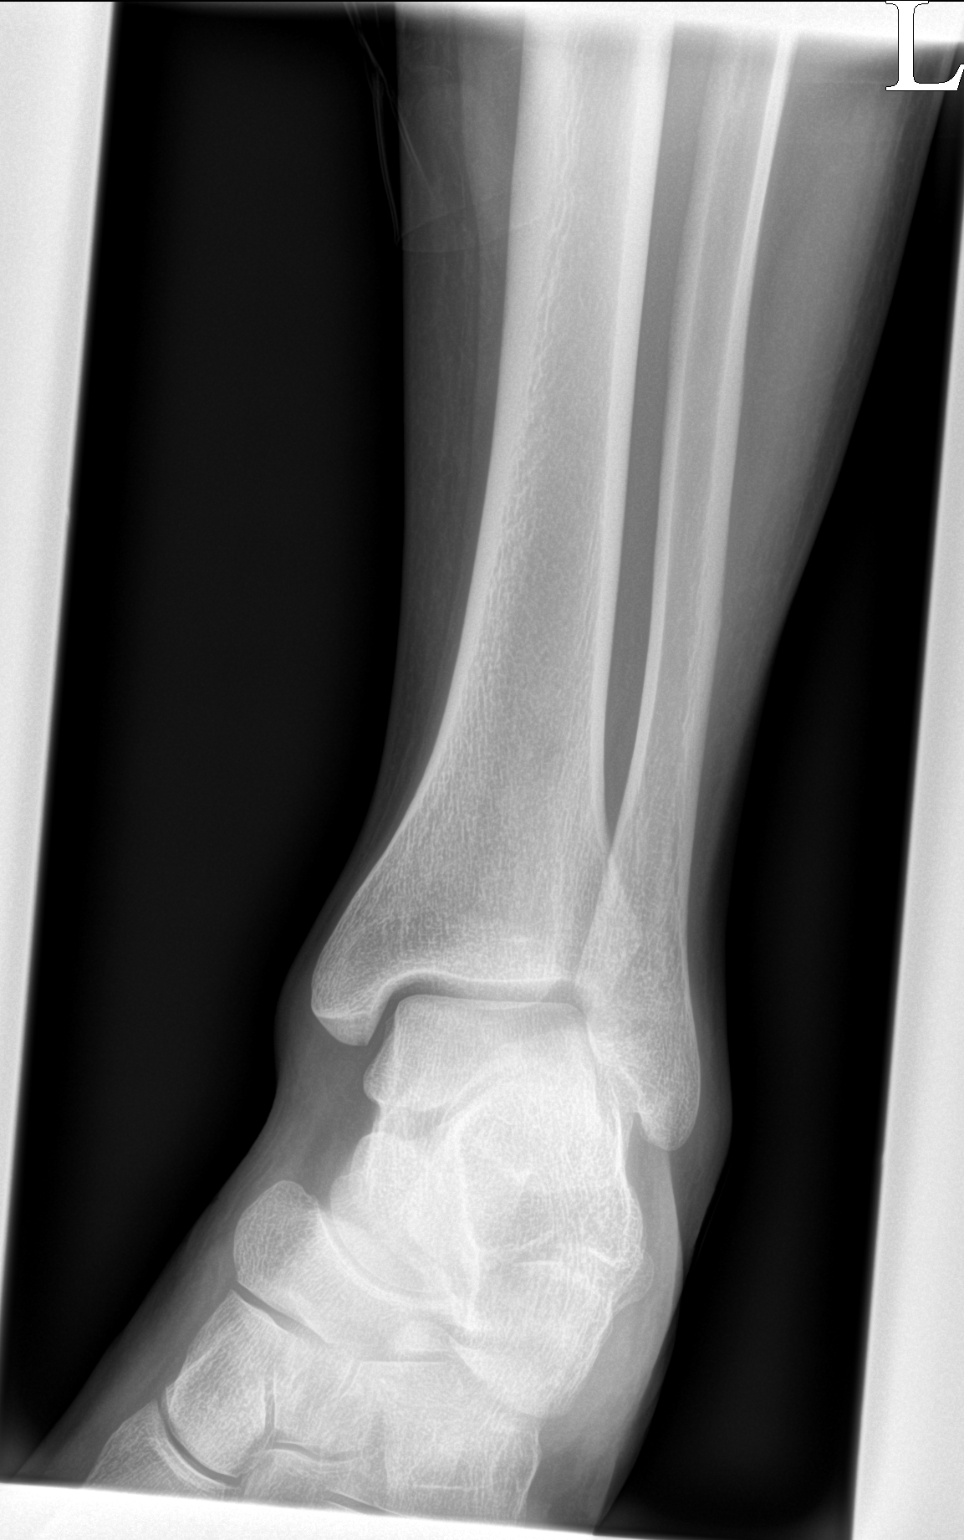

[ankle lat]
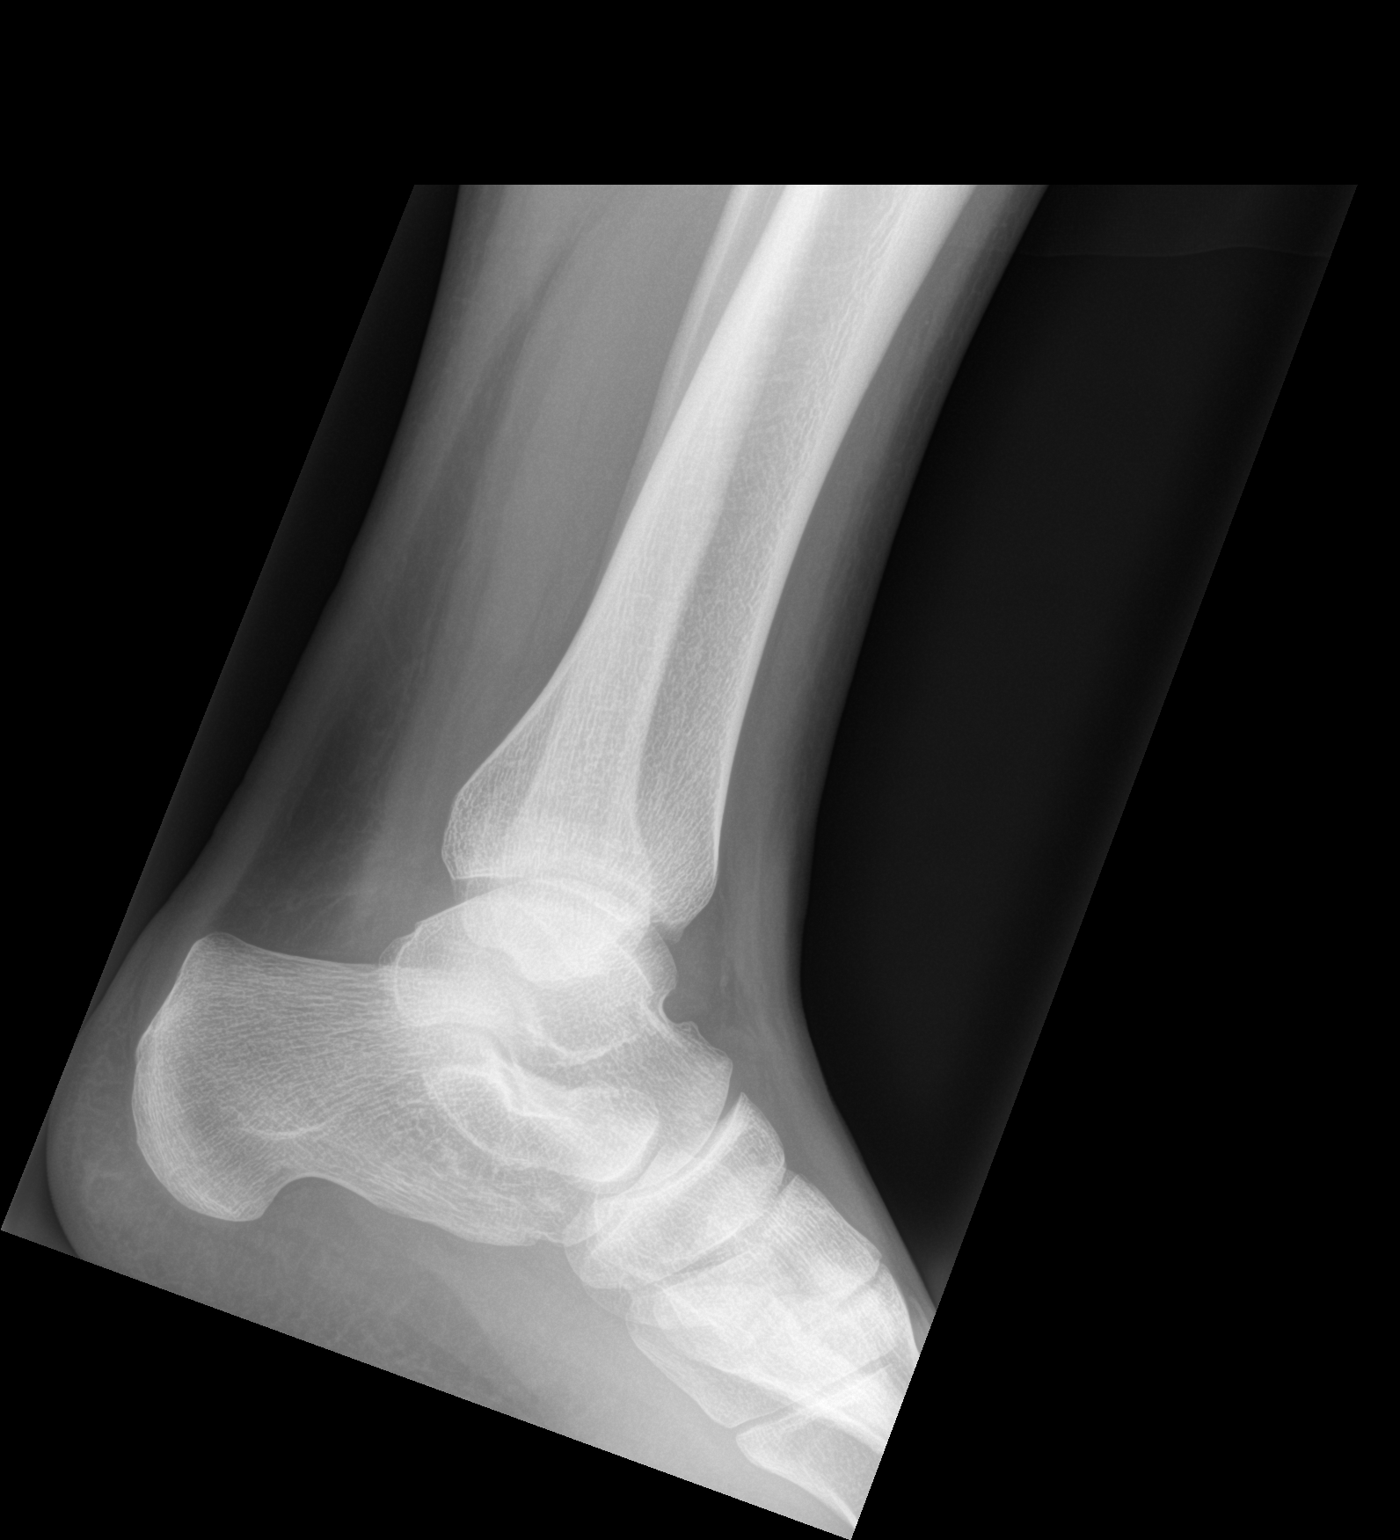

[3 of 3 positions shown; findings below may reference images not displayed]

FINDINGS: No acute or healing fracture. Normal alignment. Ankle mortise is
preserved. Talar dome is intact. There is a small ankle joint
effusion that is new from prior exam. Mild lateral soft tissue
edema.
IMPRESSION: No acute or healing fracture. Small ankle joint effusion and lateral
soft tissue edema.

## 2019-11-23 MED ORDER — PREDNISONE 10 MG (21) PO TBPK: ORAL_TABLET | ORAL | 0 refills | Status: DC

## 2019-11-23 MED ORDER — MELOXICAM 7.5 MG PO TABS: 7.5000 mg | ORAL_TABLET | Freq: Two times a day (BID) | ORAL | 2 refills | Status: DC | PRN

## 2019-11-23 NOTE — Patient Instructions (Addendum)
You had a L ankle injection today.  Call or go to the ER if you develop a large red swollen joint with extreme pain or oozing puss.   Get xray today.   If the shot does not help a lot next step is MRI.  Let me know.  Give it a week.   I do recommend the COVID vaccine.  It is easy to get at the pharmacy now.

## 2019-11-23 NOTE — Progress Notes (Signed)
I, Wendy Poet, LAT, ATC, am serving as scribe for Dr. Lynne Leader.  Jimmy [name] is a 29 y.o. male who presents to [facility] today for f/u L ankle sprain that he sustained on 09/29/19.  He was last seen by Dr. Georgina Snell on 10/19/19 and noted con't L ankle pain especially at the end of the day after being on his feet all day at work.  He had been wearing an ankle brace.  He has been doing PT for both his R shoulder and L ankle.  Since his last visit, pt reports continued ankle pain.  He has pain and swelling especially stands up after being seated for a while.  It is his ability to work normally.  He also has questions about the COVID-19 vaccine.  He has not been vaccinated yet.  Diagnostic imaging: L ankle XR- 09/29/19  Pertinent review of systems: No fevers or chills  Relevant historical information: History right shoulder SLAP tear   Exam:  Heart rate 80 bpm General: Well Developed, well nourished, and in no acute distress.   MSK: Left ankle small effusion otherwise normal. Normal motion. Tender palpation ATFL and syndesmosis region. Stable ligaments exam. Intact strength.    Lab and Radiology Results X-ray images obtained today at the end of the visit personally and independently reviewed No acute fractures visible.  No severe DJD. Await formal radiology review  Procedure: Real-time Ultrasound Guided Injection of anterior lateral ankle Device: Philips Affiniti 50G Images permanently stored and available for review in PACS Verbal informed consent obtained.  Discussed risks and benefits of procedure. Warned about infection bleeding damage to structures skin hypopigmentation and fat atrophy among others. Patient expresses understanding and agreement Time-out conducted.   Noted no overlying erythema, induration, or other signs of local infection.   Skin prepped in a sterile fashion.   Local anesthesia: Topical Ethyl chloride.   With sterile  technique and under real time ultrasound guidance:  40 mg of Kenalog and 2 mL of Marcaine injected easily.   Completed without difficulty   Pain immediately resolved suggesting accurate placement of the medication.   Advised to call if fevers/chills, erythema, induration, drainage, or persistent bleeding.   Images permanently stored and available for review in the ultrasound unit.  Impression: Technically successful ultrasound guided injection.     Assessment and Plan: 29 y.o. male with persistent ankle pain after ankle sprain.  Patient has had dedicated physical therapy and still having pain.  Is been over 6 weeks now.  Plan for repeat x-ray and injection today.  If not improving after a week or 2 next step is MRI to further characterize cause of pain.  Additionally spent some time today discussing COVID-19 vaccines.  He has questions but is open minded about it.  After the visit he thinks he is going to go get vaccinated.   PDMP not reviewed this encounter. Orders Placed This Encounter  Procedures  . [place] LIMITED JOINT SPACE STRUCTURES LOW LEFT(NO LINKED CHARGES)    Order Specific Question:   Reason for Exam (SYMPTOM  OR DIAGNOSIS REQUIRED)    Answer:   eval ankle    Order Specific Question:   Preferred imaging location?    Answer:   [facility]  . DG Ankle Complete Left    Standing Status:   Future    Number of Occurrences:   1    Standing Expiration Date:   11/22/2020    Order Specific  Question:   Reason for Exam (SYMPTOM  OR DIAGNOSIS REQUIRED)    Answer:   eval ankle pain    Order Specific Question:   Preferred imaging location?    Answer:   [facility]    Order Specific Question:   Radiology Contrast Protocol - do NOT remove file path    Answer:   [email or web address]   No orders of the defined types were placed in this encounter.    Discussed warning signs or symptoms. Please see discharge  instructions. Patient expresses understanding.   The above documentation has been reviewed and is accurate and complete Lynne Leader, M.D.

## 2019-11-23 NOTE — Progress Notes (Signed)
Post-Op Visit Note   Patient: Jimmy [name]           Date of Birth: February 27, 1991           MRN: [id number] Visit Date: 11/23/2019 PCP: Charlott Rakes, MD   Assessment & Plan:  Chief Complaint:  Chief Complaint  Patient presents with  . Right Shoulder - Pain   Visit Diagnoses:  1. Superior labrum anterior-to-posterior (SLAP) tear of left shoulder     Plan: Patient is almost 12 weeks status post right shoulder arthroscopy.  He states that he is having increased pain in his right shoulder due to the work that he is doing because they are short staffed.  He feels that he is not able to continue to do the type of work given his shoulder pain.  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  Physical exam shows fully healed surgical scars.  He has mild restriction range of motion with mild pain.  Strength is normal to manual muscle testing.  Impression is chronic right shoulder pain status post right shoulder scope.  At this point he will need to undergo an FCE so that we can determine specific restrictions that I can impose for his work.  I have sent in referral for this.  I will see him back after the FCE.  Follow-Up Instructions: Return if symptoms worsen or fail to improve.   Orders:  Orders Placed This Encounter  Procedures  . Ambulatory referral to Physical Therapy   Meds ordered this encounter  Medications  . predniSONE (STERAPRED UNI-PAK 21 TAB) 10 MG (21) TBPK tablet    Sig: Take as directed    Dispense:  21 tablet    Refill:  0  . meloxicam (MOBIC) 7.5 MG tablet    Sig: Take 1 tablet (7.5 mg total) by mouth 2 (two) times daily as needed for pain.    Dispense:  30 tablet    Refill:  2    Imaging: No results found.  PMFS History: Patient Active Problem List   Diagnosis Date Noted  . 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 Carpal tunnel syndrome 09/08/2019  . Bursitis of  shoulder, right 08/31/2019  . Incisional hernia, without obstruction or gangrene 05/20/2018  . Opiate dependence ([place]) 08/02/2013   Past Medical History:  Diagnosis Date  . ADHD (attention deficit hyperactivity disorder)   . Anxiety   . Arthritis   . Cancer ([place])    skin cancer  . Chest pain 08/2018  . GERD (gastroesophageal reflux disease)    no meds  . Migraine    migraines due to pinched nerve  . Pinched nerve in neck   . Pinched nerve in neck   . Scoliosis     Family History  Problem Relation Age of Onset  . Diabetes Mother   . Diabetes Maternal Grandmother   . Cancer Maternal Grandmother        unknown type. doing chemo  . Cancer Maternal Grandfather   . Diabetes Maternal Aunt     Past Surgical History:  Procedure Laterality Date  . ESOPHAGUS SURGERY     as a baby  . SHOULDER ARTHROSCOPY WITH SUBACROMIAL DECOMPRESSION Right 09/01/2019   Procedure: RIGHT SHOULDER ARTHROSCOPY WITH EXTENSIVE DEBRIDEMENT AND SUBACROMIAL DECOMPRESSION;  Surgeon: Leandrew Koyanagi, MD;  Location: [facility];  Service: Orthopedics;  Laterality: Right;  . VENTRAL HERNIA REPAIR N/A 08/31/2018  Procedure: HERNIA REPAIR VENTRAL ADULT;  Surgeon: Olean Ree, MD;  Location: [facility];  Service: General;  Laterality: N/A;   Social History   Occupational History  . Not on file  Tobacco Use  . Smoking status: Current Every Day Smoker    Packs/day: 1.00    Years: 15.00    Pack years: 15.00    Types: Cigarettes  . Smokeless tobacco: Current User  Vaping Use  . Vaping Use: Some days  . Substances: Nicotine  Substance and Sexual Activity  . Alcohol use: Not Currently  . Drug use: Not Currently    Types: Cocaine, Marijuana    Comment: not since 07-2018  . Sexual activity: Yes

## 2019-11-24 ENCOUNTER — Encounter: Payer: Self-pay | Admitting: Family Medicine

## 2019-11-24 ENCOUNTER — Ambulatory Visit: Payer: No Typology Code available for payment source | Admitting: Family Medicine

## 2019-11-24 NOTE — Progress Notes (Signed)
Ankle x-ray shows no fracture.  Hopefully the injection will help if not next step is MRI.

## 2019-11-27 ENCOUNTER — Ambulatory Visit
Admission: EM | Admit: 2019-11-27 | Discharge: 2019-11-27 | Disposition: A | Discharge status: Home / Self Care | Payer: No Typology Code available for payment source | Attending: Emergency Medicine | Admitting: Emergency Medicine

## 2019-11-27 ENCOUNTER — Other Ambulatory Visit: Payer: Self-pay

## 2019-11-27 DIAGNOSIS — Z20822 Contact with and (suspected) exposure to covid-19: Secondary | ICD-10-CM

## 2019-11-27 NOTE — ED Triage Notes (Signed)
covid test

## 2019-11-29 LAB — NOVEL CORONAVIRUS, NAA: SARS-CoV-2, NAA: NOT DETECTED

## 2019-11-29 LAB — SARS-COV-2, NAA 2 DAY TAT

## 2019-11-30 ENCOUNTER — Telehealth: Payer: Self-pay

## 2019-11-30 NOTE — Telephone Encounter (Signed)
Jimmy [name] with [name] would like to know if referral could be changed from physical therapy to occupational therapy for his left shoulder.  Cb# [phone].  Please advise.  Thank you.

## 2019-12-01 NOTE — Telephone Encounter (Signed)
Taken care.

## 2019-12-02 ENCOUNTER — Telehealth: Payer: Self-pay

## 2019-12-02 NOTE — Telephone Encounter (Signed)
Can you add this into referral  please. Thank you.   Eval & treat-bil shoulders

## 2019-12-02 NOTE — Telephone Encounter (Signed)
both

## 2019-12-02 NOTE — Telephone Encounter (Signed)
Jimmy [name] with [name].would like to know if therapy will be for both shoulders or just one?  Stated that patient advised her that he will be having surgery in December and wanted to know if patient is to have therapy before his surgery.  Cb# [phone].  Please advise.  Thank you.

## 2019-12-02 NOTE — Telephone Encounter (Signed)
See message.

## 2019-12-03 NOTE — Telephone Encounter (Signed)
Noted in referral.

## 2019-12-09 ENCOUNTER — Telehealth: Payer: Self-pay | Admitting: Orthopaedic Surgery

## 2019-12-09 NOTE — Telephone Encounter (Signed)
Please advise 

## 2019-12-09 NOTE — Telephone Encounter (Signed)
Crystal from [facility] called on behalf of patient. Crystal states a referral for OT was sent for patient and patient stated he is already having pt for both shoulders and is having surgery on left shoulder and don't need OT at this time. Crystal question is do they need to hold referral until after patient has left shoulder surgery until pt surgery in Dec 2021. Please call Crystal back about this matter at [phone].

## 2019-12-10 ENCOUNTER — Telehealth: Payer: Self-pay | Admitting: Family Medicine

## 2019-12-10 DIAGNOSIS — M25512 Pain in left shoulder: Secondary | ICD-10-CM

## 2019-12-10 DIAGNOSIS — S93492D Sprain of other ligament of left ankle, subsequent encounter: Secondary | ICD-10-CM

## 2019-12-10 DIAGNOSIS — S43431A Superior glenoid labrum lesion of right shoulder, initial encounter: Secondary | ICD-10-CM

## 2019-12-10 DIAGNOSIS — G8929 Other chronic pain: Secondary | ICD-10-CM

## 2019-12-10 NOTE — Addendum Note (Signed)
Addended by: Gregor Hams on: 12/10/2019 12:52 PM   Modules accepted: Orders

## 2019-12-10 NOTE — Telephone Encounter (Signed)
MRI of the ankle was ordered.  You should hear soon about scheduling.  I have referred to physical therapy at a different location for the functional capacity evaluation.  You should hear about that soon as well.

## 2019-12-10 NOTE — Telephone Encounter (Signed)
Jimmy [name] aware she may close referral and if we need it again we will send new order

## 2019-12-10 NOTE — Telephone Encounter (Signed)
Ok to cancel if the patient doesn't feel like he needs OT.

## 2019-12-10 NOTE — Telephone Encounter (Signed)
Pt called, would like for [place] to order MRI for his foot as it has not improved since injection. He would also like the testing for his shoulders that was discussed ( functional capacity? ).

## 2019-12-13 ENCOUNTER — Encounter: Payer: Self-pay | Admitting: Physical Therapy

## 2019-12-13 NOTE — Telephone Encounter (Signed)
Pt sent MyChart message w/ this info.

## 2019-12-18 ENCOUNTER — Other Ambulatory Visit: Payer: Self-pay

## 2019-12-18 ENCOUNTER — Ambulatory Visit: Payer: No Typology Code available for payment source | Attending: Family Medicine

## 2019-12-18 DIAGNOSIS — M25512 Pain in left shoulder: Secondary | ICD-10-CM | POA: Insufficient documentation

## 2019-12-18 DIAGNOSIS — M25511 Pain in right shoulder: Secondary | ICD-10-CM | POA: Diagnosis not present

## 2019-12-18 NOTE — Therapy (Signed)
[place], [name], [address] Phone: [phone]   Fax:  [phone]  Physical Therapy Evaluation  Patient Details  Name: MARTICE DOTY MRN: [id number] Date of Birth: [birth year] Referring Provider (PT): Lynne Leader, MD   Encounter Date: 12/18/2019   PT End of Session - 12/18/19 0854    Visit Number 1    Number of Visits 1    Authorization Type [employer]    PT Start Time 0845    Activity Tolerance Patient limited by pain    Behavior During Therapy [facility] for tasks assessed/performed           Past Medical History:  Diagnosis Date  . ADHD (attention deficit hyperactivity disorder)   . Anxiety   . Arthritis   . Cancer ([place])    skin cancer  . Chest pain 08/2018  . GERD (gastroesophageal reflux disease)    no meds  . Migraine    migraines due to pinched nerve  . Pinched nerve in neck   . Pinched nerve in neck   . Scoliosis     Past Surgical History:  Procedure Laterality Date  . ESOPHAGUS SURGERY     as a baby  . SHOULDER ARTHROSCOPY WITH SUBACROMIAL DECOMPRESSION Right 09/01/2019   Procedure: RIGHT SHOULDER ARTHROSCOPY WITH EXTENSIVE DEBRIDEMENT AND SUBACROMIAL DECOMPRESSION;  Surgeon: Leandrew Koyanagi, MD;  Location: [facility];  Service: Orthopedics;  Laterality: Right;  . VENTRAL HERNIA REPAIR N/A 08/31/2018   Procedure: HERNIA REPAIR VENTRAL ADULT;  Surgeon: Olean Ree, MD;  Location: [facility];  Service: General;  Laterality: N/A;    There were no vitals filed for this visit.    Subjective Assessment - 12/18/19 0849    Subjective He reports Sx on RT shouder and has pain with using sledge hammer and will have Sx onLT shoulder in 02/2020.  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   Patient Stated Goals Get restrictions for work               [facility] PT Assessment - 12/18/19 0001      Assessment   Medical Diagnosis bilateral shoulder pain, post Sx tear RT glenoid    Referring Provider (PT) Lynne Leader, MD    Onset Date/Surgical Date 08/31/19    Prior Therapy PT post Sx and stopped 4-6 weeks ago      Precautions   Precautions Anterior Hip      Restrictions   Weight Bearing Restrictions No      Balance Screen   Has the patient fallen in the past 6 months No      Prior Function   Level of Independence Independent      Cognition   Overall Cognitive Status Within Functional Limits for tasks assessed                      Objective measurements completed on examination: See above findings.                             Patient will benefit from skilled therapeutic intervention in order to improve the following deficits and impairments:     Visit Diagnosis: Right shoulder pain, unspecified chronicity  Left shoulder pain, unspecified chronicity     Problem  List Patient Active Problem List   Diagnosis Date Noted  . 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 Carpal tunnel syndrome 09/08/2019  . Bursitis of shoulder, right 08/31/2019  . Incisional hernia, without obstruction or gangrene 05/20/2018  . Opiate dependence ([place]) 08/02/2013    Darrel Hoover  PT 12/18/2019, 8:55 AM  [facility] [address] [place], [name], [address] Phone: [phone]   Fax:  [phone]  Name: SAJJAD HONEA MRN: [id number] Date of Birth: 09/12/1990

## 2019-12-19 ENCOUNTER — Other Ambulatory Visit: Payer: No Typology Code available for payment source

## 2019-12-22 ENCOUNTER — Ambulatory Visit: Payer: No Typology Code available for payment source

## 2019-12-23 ENCOUNTER — Encounter: Payer: Self-pay | Admitting: Family Medicine

## 2019-12-27 ENCOUNTER — Other Ambulatory Visit: Payer: Self-pay

## 2019-12-27 ENCOUNTER — Ambulatory Visit (INDEPENDENT_AMBULATORY_CARE_PROVIDER_SITE_OTHER): Payer: No Typology Code available for payment source

## 2019-12-27 DIAGNOSIS — S93492D Sprain of other ligament of left ankle, subsequent encounter: Secondary | ICD-10-CM | POA: Diagnosis not present

## 2019-12-27 IMAGING — MR MR ANKLE*L* W/O CM
5 series · 40 of 40 positions shown · non-contrast
Comparison: Plain films left ankle [DATE] and [DATE].

CLINICAL DATA: Left ankle pain since a twisting injury [DATE].
Subsequent encounter.

EXAM:
MRI OF THE LEFT ANKLE WITHOUT CONTRAST
TECHNIQUE: Multiplanar, multisequence MR imaging of the ankle was performed. No
intravenous contrast was administered.

[Series 8: T2 fat-sat · axial · 3.0mm · 0.62mm/px · z∈[-28,+100]mm · 8 of 33 slices shown]
[im 1/33]
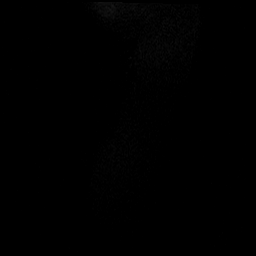
[im 5/33]
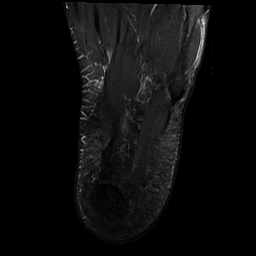
[im 10/33]
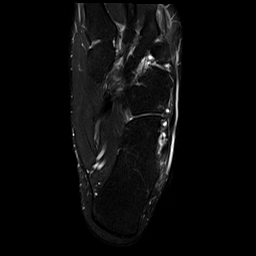
[im 14/33]
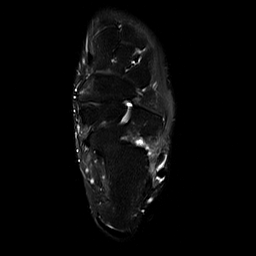
[im 19/33]
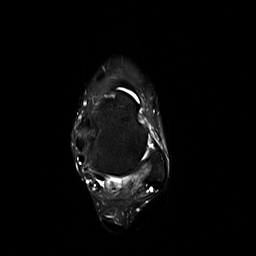
[im 23/33]
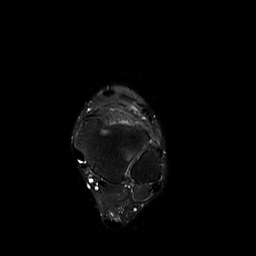
[im 28/33]
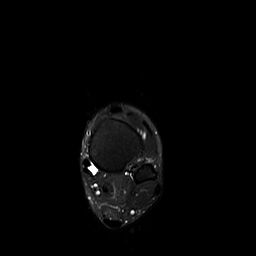
[im 33/33]
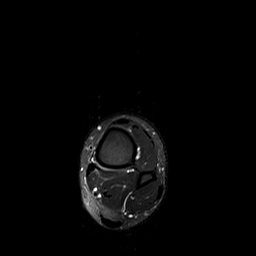

[Series 9: PD fat-sat · axial · 3.0mm · 0.62mm/px · z∈[-28,+100]mm · 8 of 33 slices shown]
[im 1/33]
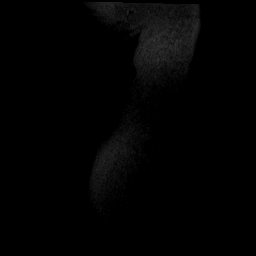
[im 5/33]
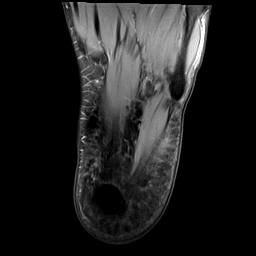
[im 10/33]
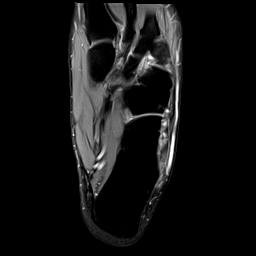
[im 14/33]
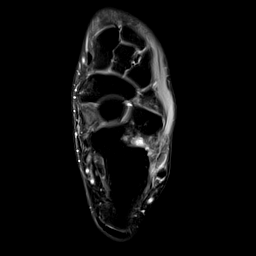
[im 19/33]
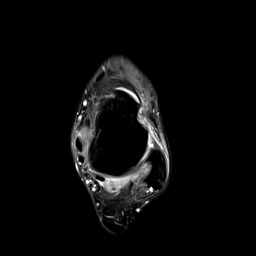
[im 23/33]
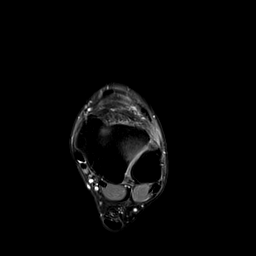
[im 28/33]
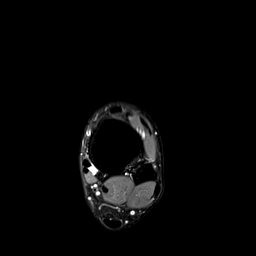
[im 33/33]
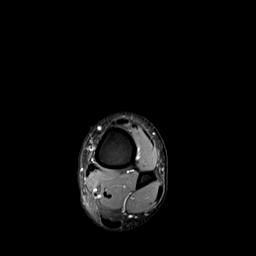

[Series 10: STIR · coronal · 3.0mm · 0.62mm/px · 9 of 36 slices shown (1 of 2)]
[im 1/36]
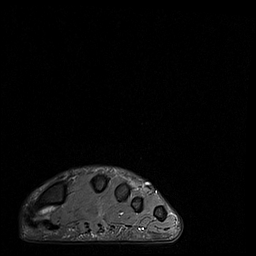
[im 5/36]
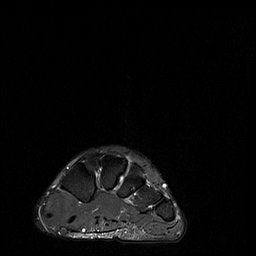
[im 9/36]
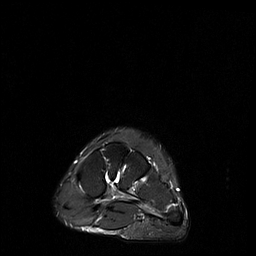
[im 14/36]
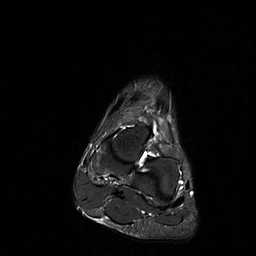
[im 18/36]
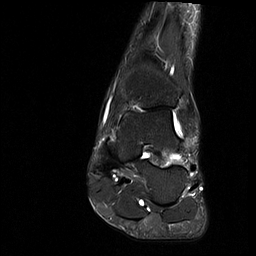
[im 22/36]
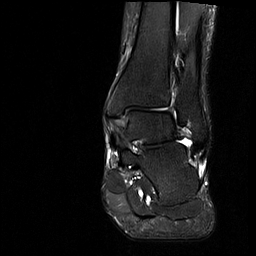
[im 27/36]
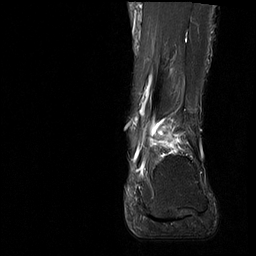
[im 31/36]
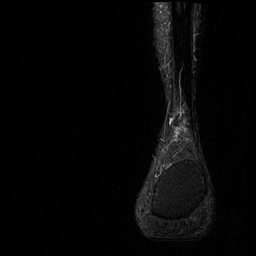
[im 36/36]
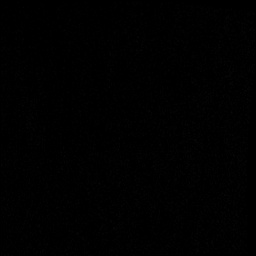

[Series 11: T1 · sagittal · 3.0mm · 0.62mm/px · 8 of 30 slices shown]
[im 1/30]
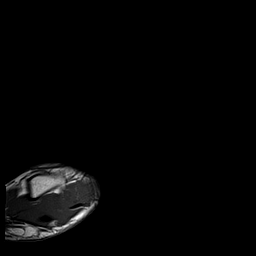
[im 5/30]
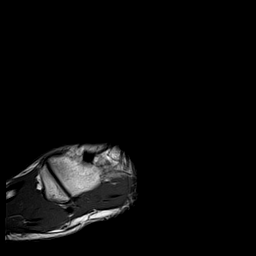
[im 9/30]
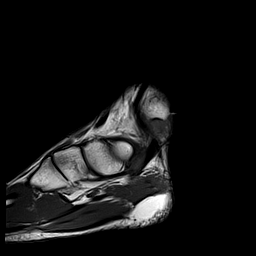
[im 13/30]
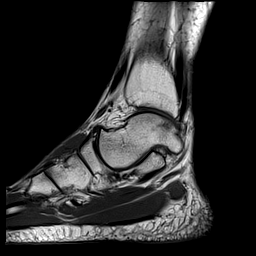
[im 17/30]
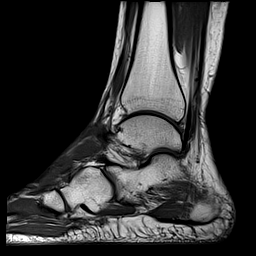
[im 21/30]
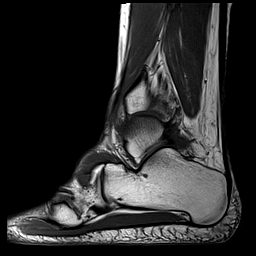
[im 25/30]
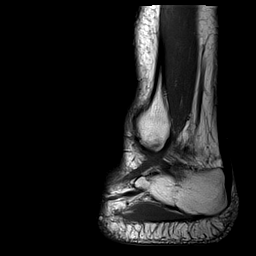
[im 30/30]
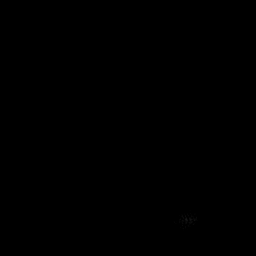

[Series 12: STIR · sagittal · 3.0mm · 0.62mm/px · 7 of 26 slices shown (2 of 2)]
[im 1/26]
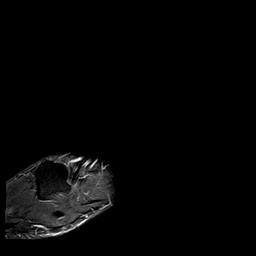
[im 5/26]
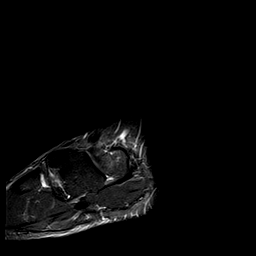
[im 9/26]
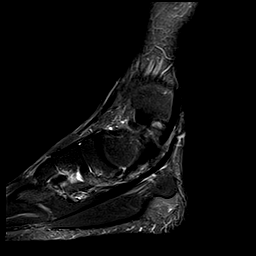
[im 13/26]
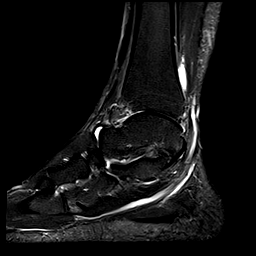
[im 17/26]
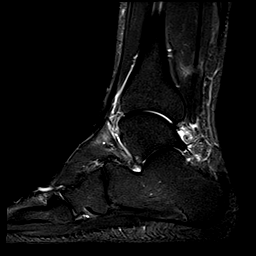
[im 21/26]
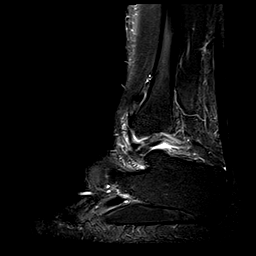
[im 26/26]
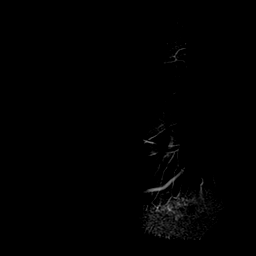

[40 of 40 positions shown; findings below may reference images not displayed]

FINDINGS: TENDONS

Peroneal: Intact.

Posteromedial: Intact.

Anterior: Intact.

Achilles: Intact.

Plantar Fascia: Intact.  No evidence of plantar fasciitis.

LIGAMENTS

Lateral: Intact.

Medial: Intact.

CARTILAGE

Ankle Joint: Cartilage along the medial talar dome is thinned. There
is a punctate focus of subchondral edema in the periphery of the
medial talar dome.

Subtalar Joints/Sinus Tarsi: Normal.

Bones: No fracture, contusion or focal lesion.

Other: None.
IMPRESSION: Negative for tendon or ligament tear.

Cartilage thinning along the medial talar dome with a punctate focus
of subchondral edema could be posttraumatic.

## 2019-12-28 ENCOUNTER — Other Ambulatory Visit (INDEPENDENT_AMBULATORY_CARE_PROVIDER_SITE_OTHER): Payer: No Typology Code available for payment source

## 2019-12-28 ENCOUNTER — Ambulatory Visit (INDEPENDENT_AMBULATORY_CARE_PROVIDER_SITE_OTHER): Payer: No Typology Code available for payment source | Admitting: Gastroenterology

## 2019-12-28 ENCOUNTER — Encounter: Payer: Self-pay | Admitting: Gastroenterology

## 2019-12-28 VITALS — BP 122/78 | HR 86 | Ht 64.0 in | Wt 176.2 lb

## 2019-12-28 DIAGNOSIS — R10A1 Flank pain, right side: Secondary | ICD-10-CM

## 2019-12-28 DIAGNOSIS — K219 Gastro-esophageal reflux disease without esophagitis: Secondary | ICD-10-CM

## 2019-12-28 DIAGNOSIS — K760 Fatty (change of) liver, not elsewhere classified: Secondary | ICD-10-CM

## 2019-12-28 DIAGNOSIS — R1013 Epigastric pain: Secondary | ICD-10-CM

## 2019-12-28 DIAGNOSIS — R945 Abnormal results of liver function studies: Secondary | ICD-10-CM

## 2019-12-28 DIAGNOSIS — R932 Abnormal findings on diagnostic imaging of liver and biliary tract: Secondary | ICD-10-CM

## 2019-12-28 DIAGNOSIS — R7989 Other specified abnormal findings of blood chemistry: Secondary | ICD-10-CM

## 2019-12-28 DIAGNOSIS — R109 Unspecified abdominal pain: Secondary | ICD-10-CM

## 2019-12-28 LAB — IBC + FERRITIN
Ferritin: 34 ng/mL (ref 22.0–322.0)
Iron: 96 ug/dL (ref 42–165)
Saturation Ratios: 24.4 % (ref 20.0–50.0)
Transferrin: 281 mg/dL (ref 212.0–360.0)

## 2019-12-28 LAB — COMPREHENSIVE METABOLIC PANEL
ALT: 21 U/L (ref 0–53)
AST: 13 U/L (ref 0–37)
Albumin: 4.6 g/dL (ref 3.5–5.2)
Alkaline Phosphatase: 69 U/L (ref 39–117)
BUN: 13 mg/dL (ref 6–23)
CO2: 24 mEq/L (ref 19–32)
Calcium: 9.2 mg/dL (ref 8.4–10.5)
Chloride: 108 mEq/L (ref 96–112)
Creatinine, Ser: 0.73 mg/dL (ref 0.40–1.50)
GFR: 123.87 mL/min (ref >60.00)
Glucose, Bld: 80 mg/dL (ref 70–99)
Potassium: 4 mEq/L (ref 3.5–5.1)
Sodium: 138 mEq/L (ref 135–145)
Total Bilirubin: 0.4 mg/dL (ref 0.2–1.2)
Total Protein: 7.3 g/dL (ref 6.0–8.3)

## 2019-12-28 LAB — LIPASE: Lipase: 6 U/L — ABNORMAL LOW (ref 11.0–59.0)

## 2019-12-28 MED ORDER — OMEPRAZOLE 40 MG PO CPDR
40.0000 mg | DELAYED_RELEASE_CAPSULE | Freq: Every day | ORAL | 3 refills | Status: DC
Start: 2019-12-28 — End: 2020-02-24

## 2019-12-28 NOTE — Progress Notes (Signed)
[place] VISIT   Primary Care Provider Charlott Rakes, MD [name] [address] [phone]  Referring Provider Charlott Rakes, MD [address] [place],  [place] [address] [phone]  Patient Profile: Jimmy [name] is a 29 y.o. male with a pmh significant for ADHD, skin cancer, arthritis, anxiety, hemorrhoids, ?  Fatty liver disease (noted on imaging), hemorrhoids, chronic right upper quadrant abdominal pain.  The patient presents to the [facility] for an evaluation and management of problem(s) noted below:  Problem List 1. Fatty liver   2. Abdominal wall pain   3. Right flank pain   4. Abnormal liver ultrasound   5. Gastroesophageal reflux disease without esophagitis   6. History of Abnormal LFTs (liver function tests)     History of Present Illness This is the patient's first visit to the outpatient [facility].  The patient presents for further evaluation of abnormal ultrasound imaging with concern for fatty liver disease as well as other GI and potentially non-GI symptoms.  The patient states that he has had acid reflux that comes and goes based on the foods that he eats.  If he eats spicy foods or foods with tomato paste or peppers he gets acid reflux and pyrosis.  He previously has been on 20 mg of omeprazole and recently been increased to 40 mg though he has not noticed a significant difference.  If he takes the medication it does help maintain his ability to eat.  Patient gets atypical chest pains at times.  He has had decreased appetite that comes and goes at times.  He has had weight changes of approximately 15 pounds over the course the last 5 years anywhere between the low 160s to 180 and fluctuates between.  The patient states that over the course of greater than 1-1/2 years he has had constant pain in his right upper quadrant region.  The pain never goes away and sometimes can move towards  his left or sometimes down his abdomen.  This is not exacerbated by food intake or fasting.  It can occur throughout the day and the aggravation can occur for no rhyme or reason.  The patient was previously told that he may have ulcers though he has undergone an upper endoscopy and colonoscopy at [facility] which did not show any evidence of abnormalities.  Esophagus biopsies were obtained which were unremarkable showing no evidence of EOE.  Patient has tobacco history.  He works in Architect.  He underwent a recent abdominal ultrasound with results as below that suggested potential for hepatic steatosis.  He has had an elevated alkaline phosphatase in the past but has not had a significant GI work-up.  GI Review of Systems Positive as above Negative for odynophagia, dysphagia, nausea, vomiting, change in bowel habits, melena, hematochezia  Review of Systems General: Denies fevers/chills HEENT: Denies oral lesions Cardiovascular: Denies current chest pain/palpitations Pulmonary: Denies shortness of breath Gastroenterological: See HPI Genitourinary: Denies darkened urine or hematuria Hematological: Denies easy bruising/bleeding Endocrine: Denies temperature intolerance Dermatological: Denies jaundice Psychological: Mood is anxious to get an answer to his longstanding issues   Medications Current Outpatient Medications  Medication Sig Dispense Refill  . albuterol (VENTOLIN HFA) 108 (90 Base) MCG/ACT inhaler Inhale 1-2 puffs into the lungs every 6 (six) hours as needed for wheezing or shortness of breath. 18 g 0  . Erenumab-aooe (AIMOVIG) 70 MG/ML SOAJ Inject 70 mg into the skin every 28 (twenty-eight) days. 1 pen 11  .  nicotine (NICODERM CQ) 14 mg/24hr patch Place 1 patch (14 mg total) onto the skin daily. 28 patch 1  . QUEtiapine (SEROQUEL) 100 MG tablet Take 1 tablet (100 mg total) by mouth at bedtime. 30 tablet 6  . omeprazole (PRILOSEC) 40 MG capsule Take 1 capsule (40 mg total) by mouth  daily. 30 capsule 3   No current facility-administered medications for this visit.    Allergies Allergies  Allergen Reactions  . Cyclobenzaprine Hives and Nausea Only  . Depakote [Divalproex Sodium] Other (See Comments)    "made him crazy"  . Ritalin [Methylphenidate Hcl] Other (See Comments)    Increased hyper activeness   . Naproxen Nausea Only    Acid reflux  . Other Other (See Comments)    [facility]-pt states skin became reddened and he tasted metal (08-2018)  . Tramadol Other (See Comments)    Acid reflux    Histories Past Medical History:  Diagnosis Date  . ADHD (attention deficit hyperactivity disorder)   . Anxiety   . Arthritis   . Cancer ([place])    skin cancer  . Chest pain 08/2018  . GERD (gastroesophageal reflux disease)    no meds  . Migraine    migraines due to pinched nerve  . Pinched nerve in neck   . Pinched nerve in neck   . Scoliosis    Past Surgical History:  Procedure Laterality Date  . ESOPHAGUS SURGERY     as a baby  . SHOULDER ARTHROSCOPY WITH SUBACROMIAL DECOMPRESSION Right 09/01/2019   Procedure: RIGHT SHOULDER ARTHROSCOPY WITH EXTENSIVE DEBRIDEMENT AND SUBACROMIAL DECOMPRESSION;  Surgeon: Leandrew Koyanagi, MD;  Location: [facility];  Service: Orthopedics;  Laterality: Right;  . VENTRAL HERNIA REPAIR N/A 08/31/2018   Procedure: HERNIA REPAIR VENTRAL ADULT;  Surgeon: Olean Ree, MD;  Location: [facility];  Service: General;  Laterality: N/A;   Social History   Socioeconomic History  . Marital status: Married    Spouse name: Not on file  . Number of children: Not on file  . Years of education: Not on file  . Highest education level: Not on file  Occupational History  . Not on file  Tobacco Use  . Smoking status: Current Every Day Smoker    Packs/day: 1.00    Years: 15.00    Pack years: 15.00    Types: Cigarettes  . Smokeless tobacco: Former Network engineer  . Vaping Use: Former  Substance and Sexual Activity  . Alcohol  use: Not Currently  . Drug use: Not Currently  . Sexual activity: Yes  Other Topics Concern  . Not on file  Social History Narrative   Right handed   One story home    Drinks caffeine    Social Determinants of Health   Financial Resource Strain:   . Difficulty of Paying Living Expenses: Not on file  Food Insecurity:   . Worried About Charity fundraiser in the Last Year: Not on file  . Ran Out of Food in the Last Year: Not on file  Transportation Needs:   . Lack of Transportation (Medical): Not on file  . Lack of Transportation (Non-Medical): Not on file  Physical Activity:   . Days of Exercise per Week: Not on file  . Minutes of Exercise per Session: Not on file  Stress:   . Feeling of Stress : Not on file  Social Connections:   . Frequency of Communication with Friends and Family: Not on file  .  Frequency of Social Gatherings with Friends and Family: Not on file  . Attends Religious Services: Not on file  . Active Member of Clubs or Organizations: Not on file  . Attends Archivist Meetings: Not on file  . Marital Status: Not on file  Intimate Partner Violence:   . Fear of Current or Ex-Partner: Not on file  . Emotionally Abused: Not on file  . Physically Abused: Not on file  . Sexually Abused: Not on file   Family History  Problem Relation Age of Onset  . Diabetes Mother   . Kidney cancer Mother   . Diabetes Maternal Grandmother   . Cancer Maternal Grandmother        unknown type. doing chemo  . Cancer Maternal Grandfather   . Diabetes Maternal Aunt   . Stomach cancer Neg Hx   . Pancreatic cancer Neg Hx   . Esophageal cancer Neg Hx   . Liver disease Neg Hx   . Colon cancer Neg Hx   . Rectal cancer Neg Hx    I have reviewed his medical, social, and family history in detail and updated the electronic medical record as necessary.    PHYSICAL EXAMINATION  BP 122/78   Pulse 86   Ht _0  (1.626 m)   Wt 176 lb 3.2 oz (79.9 kg)   SpO2 98%   BMI  30.24 kg/m  Wt Readings from Last 3 Encounters:  12/28/19 176 lb 3.2 oz (79.9 kg)  11/14/19 165 lb (74.8 kg)  10/19/19 163 lb 9.6 oz (74.2 kg)  GEN: NAD, appears stated age, doesn't appear chronically ill PSYCH: Cooperative, without pressured speech EYE: Conjunctivae pink, sclerae anicteric ENT: MMM, without oral ulcers, no erythema or exudates noted CV: RR without R/Gs  RESP: CTAB posteriorly, without wheezing GI: Positive Carnett sign, NABS, soft, TTP in the RUQ region, ND, without rebound or guarding, no HSM appreciated MSK/EXT: No lower extremity edema SKIN: No jaundice, no spider angiomata, no concerning rashes NEURO:  Alert & Oriented x 3, no focal deficits   REVIEW OF DATA  I reviewed the following data at the time of this encounter:  GI Procedures and Studies  November 2020 EGD ([facility]) Impression: - Normal esophagus. Biopsied. - Normal stomach. - Normal examined duodenum. Pathology DIAGNOSIS    A. Esophagus, endoscopic biopsy:  Esophageal squamous epithelium with no specific pathologic diagnosis. No increase in intraepithelial eosinophils is seen.   November 2020 Colonoscopy ([facility]) Impression: - Internal hemorrhoids. - The examination was otherwise normal. - No specimens collected.  Laboratory Studies  Reviewed those in Epic and CareEverywhere  5/19 Outside labs AST/ALT - 16.5/22 AP 97 (ULN 90) elevated Tb 0.3  Other labs as per notation in chart/EPIC  Imaging Studies  2019 RUQ Ultrasound IMPRESSION: Negative right upper quadrant abdominal ultrasound  8/21 Abdominal Ultrasound (outside report) Findings:  Liver and Biliary:  * Slightly coarse appearance of the liver. No focal lesions.  * Gallbladder is non-distended and without cholelithiasis or  pericholecystic fluid. Gallbladder wall is non-hyperemic and non-thickened,  measuring 0.2 cm. Sonographic Percell Miller sign is absent.  * Common bile duct is normal in caliber,measuring 0.3 cm. No intrahepatic    bile duct dilatation.  Kidneys:  * Right kidney measures 11.0 cm; there is no hydronephrosis.  * Left kidney measures 11.0 cm; there is no hydronephrosis.  Pancreas and Spleen:  * Visualized portions of thepancreas are unremarkable.  * Spleen is homogenous in echotexture and measures 10.5 cm.  Other:  *  Proximal abdominal aorta measures 2.1 cm in AP diameter.  * Proximal inferior vena cava is patent.  * Ascites: None visualized.  Impression:  1. Mildly coarse liver, which may be seen with hepatic steatosis.    ASSESSMENT  Mr. Shipley is a 29 y.o. male with a pmh significant for ADHD, skin cancer, arthritis, anxiety, hemorrhoids, ?  Fatty liver disease (noted on imaging), hemorrhoids, chronic right upper quadrant abdominal pain.  The patient is seen today for evaluation and management of:  1. Fatty liver   2. Abdominal wall pain   3. Right flank pain   4. Abnormal liver ultrasound   5. Gastroesophageal reflux disease without esophagitis   6. History of Abnormal LFTs (liver function tests)    The patient is hemodynamically stable.  Clinically his symptoms are unchanged.  His exam is most consistent with a positive Carnett's sign suggestive that his chronic abdominal discomfort is actually not intra-abdominal or organ based but most likely musculoskeletal in abdominal wall related.  Due to the longevity of his symptoms it is reasonable to rule out an intra-abdominal process however and we will move forward with a CT abdomen/pelvis.  For his history of abnormal liver tests although most recent they have been normal but the question of potential underlying chronic liver disease in the setting of potential fatty liver disease based on liver ultrasound imaging is worthwhile to rule out/exclude potential etiologies for chronic liver disease.  We did discuss that the patient had underlying fatty liver disease that good blood pressure control, not getting diabetes, and good weight control  management are key to his treatment and management of fatty liver.  We will move forward with laboratories as outlined below.  If patient has abnormal LFTs then further work-up to rule out other processes or etiologies should be performed.  Liver biopsy could then be entertained.  Patient's chronic discomfort again is most likely abdominal wall related but it is reasonable to rule out H. pylori and we will move forward with that via stool antigen testing.  He will restart PPI after an H. pylori stool antigen has been given.  All patient questions were answered to the best of my ability, and the patient agrees to the aforementioned plan of action with follow-up as indicated.   PLAN  Laboratories as outlined below H. pylori stool antigen to be obtained Reinitiate PPI daily for patient's symptoms Lifestyle modifications discussed with patient CT abdomen/pelvis to be obtained to rule out/exclude other intra-abdominal pathology although most likely this is abdominal wall/musculoskeletal pain Referral to pain center may be reasonable in the future   Orders Placed This Encounter  Procedures  . Helicobacter pylori special antigen  . CT Abdomen Pelvis W Contrast  . Comp Met (CMET)  . Lipase  . IBC + Ferritin  . Hepatitis A antibody, total  . Hepatitis B Surface AntiBODY  . Hepatitis B Surface AntiGEN  . Hepatitis B Core Antibody, total    New Prescriptions   OMEPRAZOLE (PRILOSEC) 40 MG CAPSULE    Take 1 capsule (40 mg total) by mouth daily.   Modified Medications   No medications on file    Planned Follow Up Return in about 4 months (around 04/29/2020).   Total Time in Face-to-Face and in Coordination of Care for patient including independent/personal interpretation/review of prior testing, medical history, examination, medication adjustment, communicating results with the patient directly, and documentation with the EHR is 50 minutes.   Justice Britain, MD [place]  [facility] Office #  [phone]

## 2019-12-28 NOTE — Progress Notes (Signed)
MRI ankle does not show significant ligament or tendon tear which is great news.  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  This should improve with time.

## 2019-12-28 NOTE — Patient Instructions (Signed)
Your provider has requested that you go to the basement level for lab work before leaving today. Press "B" on the elevator. The lab is located at the first door on the left as you exit the elevator.  START: Omeprazole 61m once daily AFTER you have turned in stool studies.   We have sent the following medications to your pharmacy for you to pick up at your convenience: Omeprazole   You have been scheduled for a CT [facility] of the abdomen and [facility] at [facility] 1[facility] floor Radiology. You are scheduled on 01/05/20 at 4:00pm. You should arrive (3:45pm) 15 minutes prior to your appointment time for registration.  . The solution may taste better if refrigerated, but do NOT add ice or any other liquid to this solution. Shake well before drinking.   Please follow the written instructions below on the day of your exam:   1) Do not eat anything after 12:00pm (4 hours prior to your test)   2) Drink 1 bottle of contrast @ 2:00pm (2 hours prior to your exam)  Remember to shake well before drinking and do NOT pour over ice.     Drink 1 bottle of contrast @ 3:00pm (1 hour prior to your exam)   You may take any medications as prescribed with a small amount of water, if necessary. If you take any of the following medications: METFORMIN, GLUCOPHAGE, GLUCOVANCE, AVANDAMET, RIOMET, FORTAMET, AEnonMET, JANUMET, GLUMETZA or METAGLIP, you MAY be asked to HOLD this medication 48 hours AFTER the exam.   The purpose of you drinking the oral contrast is to aid in the visualization of your intestinal tract. The contrast solution may cause some diarrhea. Depending on your individual set of symptoms, you may also receive an intravenous injection of x-ray contrast/dye. [facility], depending on the type of exam you are having performed.   If you have any questions regarding your exam or if you need to reschedule, you may call [facility] at [phone]  the hours of 8:00 am and 5:00 pm, Monday-Friday.     You will need a office follow-up in 4 months. Office will call a later time to schedule.   Due to recent changes in healthcare laws, you may see the results of your imaging and laboratory studies on MyChart before your provider has had a chance to review them.  We understand that in some cases there may be results that are confusing or concerning to you. Not all laboratory results come back in the same time frame and the provider may be waiting for multiple results in order to interpret others.  Please give [id number] hours in order for your provider to thoroughly review all the results before contacting the office for clarification of your results.   [facility] you for [facility].  Dr. MRush Landmark

## 2019-12-29 LAB — HEPATITIS B SURFACE ANTIBODY,QUALITATIVE: Hep B S Ab: NONREACTIVE

## 2019-12-29 LAB — HEPATITIS B CORE ANTIBODY, TOTAL: Hep B Core Total Ab: NONREACTIVE

## 2019-12-29 LAB — HEPATITIS B SURFACE ANTIGEN: Hepatitis B Surface Ag: NONREACTIVE

## 2019-12-29 LAB — HEPATITIS A ANTIBODY, TOTAL: Hepatitis A AB,Total: REACTIVE — AB

## 2019-12-30 ENCOUNTER — Encounter: Payer: Self-pay | Admitting: Gastroenterology

## 2019-12-30 ENCOUNTER — Encounter: Payer: Self-pay | Admitting: Family Medicine

## 2019-12-30 DIAGNOSIS — R109 Unspecified abdominal pain: Secondary | ICD-10-CM | POA: Insufficient documentation

## 2019-12-30 DIAGNOSIS — R945 Abnormal results of liver function studies: Secondary | ICD-10-CM | POA: Insufficient documentation

## 2019-12-30 DIAGNOSIS — K219 Gastro-esophageal reflux disease without esophagitis: Secondary | ICD-10-CM | POA: Insufficient documentation

## 2019-12-30 DIAGNOSIS — K760 Fatty (change of) liver, not elsewhere classified: Secondary | ICD-10-CM | POA: Insufficient documentation

## 2019-12-30 DIAGNOSIS — R932 Abnormal findings on diagnostic imaging of liver and biliary tract: Secondary | ICD-10-CM | POA: Insufficient documentation

## 2019-12-30 DIAGNOSIS — R7989 Other specified abnormal findings of blood chemistry: Secondary | ICD-10-CM | POA: Insufficient documentation

## 2019-12-31 ENCOUNTER — Telehealth: Payer: Self-pay

## 2019-12-31 NOTE — Telephone Encounter (Signed)
Patient states that he would like for you to view his lab results from his recent visit with his gastro doctor.  Patient has active mychart account.

## 2020-01-03 ENCOUNTER — Other Ambulatory Visit: Payer: Self-pay

## 2020-01-03 DIAGNOSIS — K760 Fatty (change of) liver, not elsewhere classified: Secondary | ICD-10-CM

## 2020-01-03 DIAGNOSIS — R932 Abnormal findings on diagnostic imaging of liver and biliary tract: Secondary | ICD-10-CM

## 2020-01-05 ENCOUNTER — Ambulatory Visit (HOSPITAL_COMMUNITY): Admission: RE | Admit: 2020-01-05 | Payer: No Typology Code available for payment source | Source: Ambulatory Visit

## 2020-01-17 NOTE — Progress Notes (Signed)
Due to the COVID-19 crisis, this virtual check-in visit was done via telephone from my office and it was initiated and consent given by this patient and or family.  Telephone (Audio) Visit Due to connection issues, we were unable to perform a video virtual visit and switched to a telephone encounter.  The purpose of this telephone visit is to provide medical care while limiting exposure to the novel coronavirus.    Consent was obtained for telephone visit and initiated by pt/family:  Yes.   Answered questions that patient had about telehealth interaction:  Yes.   I discussed the limitations, risks, security and privacy concerns of performing an evaluation and management service by telephone. I also discussed with the patient that there may be a patient responsible charge related to this service. The patient expressed understanding and agreed to proceed.  Pt location: Home Physician Location: office Name of referring provider:  Charlott Rakes, MD I connected with .[name] at patients initiation/request on 01/18/2020 at  3:30 PM EDT by telephone and verified that I am speaking with the correct person using two identifiers.  Pt MRN:  [id number] Pt DOB:  24-Aug-1990  Assessment/Plan:   Migraine without aura  1. Aimovig 70mg  every month 2.  Sent script for Ubrelvy 100mg  3.  Limit use of pain relievers to no more than 2 days out of week to prevent risk of rebound or medication-overuse headache. 4.  Keep headache diary 5.  Follow up in 9 months.  Need for in person visit now:  Yes.    Subjective:  Jimmy [name] is a 29 year old male with Bipolar disorder who follows up for migraines.  UPDATE: No migraines in past 30 days.  Only gets a migraine if he has a coughing fit or has skipped a meal and is hungry.  Never received prescription for Ubrelvy but samples worked.     Current NSAIDS:Unable to take due toGI ulcers Current analgesics:none Current  triptans:none Current ergotamine:none Current anti-emetic:none Current muscle relaxants:Robaxin 750mg  Current anti-anxiolytic:none Current sleep aide:none Current Antihypertensive medications:none Current Antidepressant/antipsychoticmedications: Seroquel 100mg  at bedtime Current Anticonvulsant medications:none Current anti-CGRP:Aimovig 70mg  every 28 days; Ubrelvy 100mg  Current Vitamins/Herbal/Supplements:none Current Antihistamines/Decongestants:none Other therapy:none Hormone/birth control:none  Caffeine:1 cup of coffee daily; no soda Alcohol:no Smoker:1 ppd Diet:No soda. 8 bottles of water daily.  Exercise:No routine exercise Depression:yes; Anxiety:yes Other pain:Neck and shoulder pain Sleep: Good with quetiapine  HISTORY: He has history ofheadachessince 29 years old. They are left or right-sidedpounding/pressure/stabbingpain associated with photophobia, phonophobia but not nausea, vomiting, visual disturbance, paresthesias or unilateral numbness or weakness. He reports that they are triggered by a "pinched nerve in my neck". He has associated bilateral neck and shoulder pain. Cervical X-ray from 05/26/2013 personally reviewed did show mild disc space narrowing at C5-6 but otherwise unremarkable. No radicular pain, numbness or weakness in the upper extremities. They are typically persistent, lasting weeks to months. They may be severe for 2 week periods. It is usually 4-5/10 intensity but 8/10 when severe. 3 to 4 months he is headache-free between episodes. Nothing relieves the headache.  01/08/2011 CT BRAIN WO: No acute intracranial abnormality 07/10/2019 MRI CERVICAL SPINE: right eccentric disc bulge at C3-4 causing mild canal and right C4 foraminal stenosis, as well as mild right C3 and C5 foraminal stenosis.    Past NSAIDS:Unable to take due to GI ulcers Past analgesics:Tylenol Past abortive  triptans:Sumatriptan 50mg , rizatriptan 10mg  Past abortive ergotamine:none Past muscle relaxants:Flexeril Past anti-emetic:none Past antihypertensive medications:none Past antidepressant/antipsychoticmedications:amitriptyline  10mg  (caused metallic taste); nortriptyline (bad taste); quetiapine 100mg  at bedtime Past anticonvulsant medications:topiramate 50mg  twice daily(caused metallic taste), gabapentin 300mg  TID Past anti-CGRP:none Past vitamins/Herbal/Supplements:none Past antihistamines/decongestants:none Other past therapies:none   Family history of headache:Mother, aunt, sister   Objective:   Vitals:   01/18/20 1501  Weight: 175 lb (79.4 kg)  Height: 5\' 4"  (1.626 m)     Follow Up Instructions:      -I discussed the assessment and treatment plan with the patient. The patient was provided an opportunity to ask questions and all were answered. The patient agreed with the plan and demonstrated an understanding of the instructions.   The patient was advised to call back or seek an in-person evaluation if the symptoms worsen or if the condition fails to improve as anticipated.    Total Time spent in visit with the patient was:  6 minutes.  Dudley Major, DO

## 2020-01-18 ENCOUNTER — Encounter: Payer: Self-pay | Admitting: Neurology

## 2020-01-18 ENCOUNTER — Other Ambulatory Visit: Payer: Self-pay

## 2020-01-18 ENCOUNTER — Telehealth (INDEPENDENT_AMBULATORY_CARE_PROVIDER_SITE_OTHER): Payer: No Typology Code available for payment source | Admitting: Neurology

## 2020-01-18 VITALS — Ht 64.0 in | Wt 175.0 lb

## 2020-01-18 DIAGNOSIS — G43009 Migraine without aura, not intractable, without status migrainosus: Secondary | ICD-10-CM | POA: Diagnosis not present

## 2020-01-18 MED ORDER — UBRELVY 100 MG PO TABS: 1.0000 | ORAL_TABLET | ORAL | 5 refills | Status: DC | PRN

## 2020-01-19 ENCOUNTER — Encounter: Payer: Self-pay | Admitting: Neurology

## 2020-01-19 NOTE — Progress Notes (Addendum)
Margarito Courser (Key: [id number]) Rx #: [id number] Roselyn Meier 100MG  tablets   Form [employer] Electronic PA Form [phone] NCPDP) Created 3 days ago Sent to Plan 2 days ago Plan Response 2 days ago Submit Clinical Questions 2 days ago Determination Favorable 2 minutes ago Message from [place] PA request has been approved. Additional information will be provided in the approval communication. (Message 1145)  Received fax approval valid from 01/21/20 to 01/20/21.

## 2020-02-16 ENCOUNTER — Ambulatory Visit: Payer: Self-pay | Admitting: *Deleted

## 2020-02-16 NOTE — Telephone Encounter (Signed)
Summary: Clinical Advice   Birdie Sons (patient girlfriend) called to schedule appointment regarding patient fainting Wednesday, 02/09/2020, caller states she thinks it was a seizure. As per caller patient refused medical attention. Caller also states he has a bruise on leg/thigh might be from the fall. Caller denies patient hit his head. Patient is currently at work and does not have access to his phone. Advised the caller to have patient call in and speak with a nurse triage. Patient was scheduled with Dr, Wynetta Emery on 02/17/2020.      Attempted to call Birdie Sons- wrong number listed.  Call to patient- left message to call office to discuss symptoms.

## 2020-02-16 NOTE — Telephone Encounter (Signed)
Patient called, left VM to return the call to the office to discuss symptoms. Will route to office due to NT attempted to reach patient x 3 with no success.   Summary: Clinical Advice   Jimmy [name] (patient girlfriend) called to schedule appointment regarding patient fainting Wednesday, 02/09/2020, caller states she thinks it was a seizure. As per caller patient refused medical attention. Caller also states he has a bruise on leg/thigh might be from the fall. Caller denies patient hit his head. Patient is currently at work and does not have access to his phone. Advised the caller to have patient call in and speak with a nurse triage. Patient was scheduled with Dr, Wynetta Emery on 02/17/2020.

## 2020-02-16 NOTE — Telephone Encounter (Signed)
FYI. Pt has an appt scheduled with Dr . Wynetta Emery on 12/2

## 2020-02-17 ENCOUNTER — Ambulatory Visit: Payer: No Typology Code available for payment source | Admitting: Internal Medicine

## 2020-02-23 ENCOUNTER — Encounter (HOSPITAL_BASED_OUTPATIENT_CLINIC_OR_DEPARTMENT_OTHER): Payer: Self-pay | Admitting: Orthopaedic Surgery

## 2020-02-24 ENCOUNTER — Encounter (HOSPITAL_BASED_OUTPATIENT_CLINIC_OR_DEPARTMENT_OTHER): Payer: Self-pay | Admitting: Orthopaedic Surgery

## 2020-02-24 ENCOUNTER — Other Ambulatory Visit: Payer: Self-pay

## 2020-02-26 ENCOUNTER — Other Ambulatory Visit (HOSPITAL_COMMUNITY): Payer: No Typology Code available for payment source

## 2020-02-28 ENCOUNTER — Other Ambulatory Visit (HOSPITAL_COMMUNITY)
Admission: RE | Admit: 2020-02-28 | Discharge: 2020-02-28 | Disposition: A | Discharge status: Home / Self Care | Payer: No Typology Code available for payment source | Source: Ambulatory Visit | Attending: Orthopaedic Surgery | Admitting: Orthopaedic Surgery

## 2020-02-28 DIAGNOSIS — Z20822 Contact with and (suspected) exposure to covid-19: Secondary | ICD-10-CM | POA: Insufficient documentation

## 2020-02-28 DIAGNOSIS — Z01812 Encounter for preprocedural laboratory examination: Secondary | ICD-10-CM | POA: Insufficient documentation

## 2020-02-28 LAB — SARS CORONAVIRUS 2 (TAT 6-24 HRS): SARS Coronavirus 2: NEGATIVE

## 2020-02-28 NOTE — Progress Notes (Signed)

## 2020-03-01 ENCOUNTER — Ambulatory Visit (HOSPITAL_BASED_OUTPATIENT_CLINIC_OR_DEPARTMENT_OTHER): Payer: No Typology Code available for payment source | Admitting: Certified Registered"

## 2020-03-01 ENCOUNTER — Encounter: Payer: Self-pay | Admitting: Orthopaedic Surgery

## 2020-03-01 ENCOUNTER — Encounter (HOSPITAL_BASED_OUTPATIENT_CLINIC_OR_DEPARTMENT_OTHER)
Admission: RE | Disposition: A | Discharge status: Home / Self Care | Payer: Self-pay | Source: Home / Self Care | Attending: Orthopaedic Surgery

## 2020-03-01 ENCOUNTER — Encounter (HOSPITAL_BASED_OUTPATIENT_CLINIC_OR_DEPARTMENT_OTHER): Payer: Self-pay | Admitting: Orthopaedic Surgery

## 2020-03-01 ENCOUNTER — Other Ambulatory Visit: Payer: Self-pay

## 2020-03-01 ENCOUNTER — Ambulatory Visit (HOSPITAL_BASED_OUTPATIENT_CLINIC_OR_DEPARTMENT_OTHER)
Admission: RE | Admit: 2020-03-01 | Discharge: 2020-03-01 | Disposition: A | Discharge status: Home / Self Care | Payer: No Typology Code available for payment source | Attending: Orthopaedic Surgery | Admitting: Orthopaedic Surgery

## 2020-03-01 DIAGNOSIS — Z79899 Other long term (current) drug therapy: Secondary | ICD-10-CM | POA: Insufficient documentation

## 2020-03-01 DIAGNOSIS — Z885 Allergy status to narcotic agent status: Secondary | ICD-10-CM | POA: Insufficient documentation

## 2020-03-01 DIAGNOSIS — X58XXXA Exposure to other specified factors, initial encounter: Secondary | ICD-10-CM | POA: Insufficient documentation

## 2020-03-01 DIAGNOSIS — Z886 Allergy status to analgesic agent status: Secondary | ICD-10-CM | POA: Insufficient documentation

## 2020-03-01 DIAGNOSIS — M94212 Chondromalacia, left shoulder: Secondary | ICD-10-CM | POA: Insufficient documentation

## 2020-03-01 DIAGNOSIS — S43432A Superior glenoid labrum lesion of left shoulder, initial encounter: Secondary | ICD-10-CM | POA: Diagnosis not present

## 2020-03-01 DIAGNOSIS — Z888 Allergy status to other drugs, medicaments and biological substances status: Secondary | ICD-10-CM | POA: Insufficient documentation

## 2020-03-01 DIAGNOSIS — F17210 Nicotine dependence, cigarettes, uncomplicated: Secondary | ICD-10-CM | POA: Diagnosis not present

## 2020-03-01 HISTORY — PX: SHOULDER ARTHROSCOPY WITH BICEPS TENDON REPAIR: SHX5674

## 2020-03-01 SURGERY — SHOULDER ARTHROSCOPY WITH BICEPS TENDON REPAIR
Anesthesia: General | Site: Shoulder | Laterality: Left

## 2020-03-01 MED ORDER — PROPOFOL 10 MG/ML IV BOLUS
INTRAVENOUS | Status: DC | PRN
Start: 2020-03-01 — End: 2020-03-01
  Administered 2020-03-01: 200 mg via INTRAVENOUS

## 2020-03-01 MED ORDER — CEFAZOLIN SODIUM-DEXTROSE 2-4 GM/100ML-% IV SOLN
INTRAVENOUS | Status: AC
  Filled 2020-03-01: qty 100

## 2020-03-01 MED ORDER — PROMETHAZINE HCL 25 MG/ML IJ SOLN
6.2500 mg | INTRAMUSCULAR | Status: DC | PRN
Start: 2020-03-01 — End: 2020-03-01

## 2020-03-01 MED ORDER — FENTANYL CITRATE (PF) 100 MCG/2ML IJ SOLN
INTRAMUSCULAR | Status: AC
  Filled 2020-03-01: qty 2

## 2020-03-01 MED ORDER — DEXAMETHASONE SODIUM PHOSPHATE 10 MG/ML IJ SOLN
INTRAMUSCULAR | Status: DC | PRN
  Administered 2020-03-01: 10 mg via INTRAVENOUS

## 2020-03-01 MED ORDER — ONDANSETRON HCL 4 MG/2ML IJ SOLN
INTRAMUSCULAR | Status: DC | PRN
  Administered 2020-03-01: 4 mg via INTRAVENOUS

## 2020-03-01 MED ORDER — HYDROMORPHONE HCL 1 MG/ML IJ SOLN: 0.2500 mg | INTRAMUSCULAR | Status: DC | PRN

## 2020-03-01 MED ORDER — BUPIVACAINE LIPOSOME 1.3 % IJ SUSP
INTRAMUSCULAR | Status: DC | PRN
  Administered 2020-03-01: 10 mL via PERINEURAL

## 2020-03-01 MED ORDER — FENTANYL CITRATE (PF) 100 MCG/2ML IJ SOLN
100.0000 ug | Freq: Once | INTRAMUSCULAR | Status: AC
  Administered 2020-03-01: 100 ug via INTRAVENOUS

## 2020-03-01 MED ORDER — BUPIVACAINE HCL (PF) 0.5 % IJ SOLN
INTRAMUSCULAR | Status: DC | PRN
  Administered 2020-03-01: 20 mL via PERINEURAL

## 2020-03-01 MED ORDER — LACTATED RINGERS IV SOLN: INTRAVENOUS | Status: DC

## 2020-03-01 MED ORDER — LACTATED RINGERS IV SOLN: INTRAVENOUS | Status: DC | PRN

## 2020-03-01 MED ORDER — OXYCODONE-ACETAMINOPHEN 5-325 MG PO TABS: 1.0000 | ORAL_TABLET | Freq: Three times a day (TID) | ORAL | 0 refills | Status: DC | PRN

## 2020-03-01 MED ORDER — OXYCODONE HCL 5 MG/5ML PO SOLN: 5.0000 mg | Freq: Once | ORAL | Status: DC | PRN

## 2020-03-01 MED ORDER — CEFAZOLIN SODIUM-DEXTROSE 2-4 GM/100ML-% IV SOLN: 2.0000 g | INTRAVENOUS | Status: DC

## 2020-03-01 MED ORDER — FENTANYL CITRATE (PF) 100 MCG/2ML IJ SOLN
INTRAMUSCULAR | Status: DC | PRN
  Administered 2020-03-01 (×2): 50 ug via INTRAVENOUS
  Administered 2020-03-01: 100 ug via INTRAVENOUS

## 2020-03-01 MED ORDER — MIDAZOLAM HCL 2 MG/2ML IJ SOLN
INTRAMUSCULAR | Status: AC
  Filled 2020-03-01: qty 2

## 2020-03-01 MED ORDER — ONDANSETRON HCL 4 MG/2ML IJ SOLN
INTRAMUSCULAR | Status: AC
  Filled 2020-03-01: qty 2

## 2020-03-01 MED ORDER — LIDOCAINE HCL (CARDIAC) PF 100 MG/5ML IV SOSY
PREFILLED_SYRINGE | INTRAVENOUS | Status: DC | PRN
  Administered 2020-03-01: 60 mg via INTRAVENOUS

## 2020-03-01 MED ORDER — MIDAZOLAM HCL 2 MG/2ML IJ SOLN
INTRAMUSCULAR | Status: DC | PRN
  Administered 2020-03-01: 2 mg via INTRAVENOUS

## 2020-03-01 MED ORDER — AMISULPRIDE (ANTIEMETIC) 5 MG/2ML IV SOLN: 10.0000 mg | Freq: Once | INTRAVENOUS | Status: DC | PRN

## 2020-03-01 MED ORDER — LIDOCAINE 2% (20 MG/ML) 5 ML SYRINGE
INTRAMUSCULAR | Status: AC
  Filled 2020-03-01: qty 5

## 2020-03-01 MED ORDER — ROCURONIUM BROMIDE 10 MG/ML (PF) SYRINGE
PREFILLED_SYRINGE | INTRAVENOUS | Status: AC
  Filled 2020-03-01: qty 10

## 2020-03-01 MED ORDER — DEXAMETHASONE SODIUM PHOSPHATE 10 MG/ML IJ SOLN
INTRAMUSCULAR | Status: AC
  Filled 2020-03-01: qty 1

## 2020-03-01 MED ORDER — PROPOFOL 10 MG/ML IV BOLUS
INTRAVENOUS | Status: AC
  Filled 2020-03-01: qty 20

## 2020-03-01 MED ORDER — MEPERIDINE HCL 25 MG/ML IJ SOLN: 6.2500 mg | INTRAMUSCULAR | Status: DC | PRN

## 2020-03-01 MED ORDER — OXYCODONE HCL 5 MG PO TABS: 5.0000 mg | ORAL_TABLET | Freq: Once | ORAL | Status: DC | PRN

## 2020-03-01 MED ORDER — MIDAZOLAM HCL 2 MG/2ML IJ SOLN
2.0000 mg | Freq: Once | INTRAMUSCULAR | Status: AC
  Administered 2020-03-01: 2 mg via INTRAVENOUS

## 2020-03-01 MED ORDER — BUPIVACAINE HCL (PF) 0.25 % IJ SOLN
INTRAMUSCULAR | Status: AC
  Filled 2020-03-01: qty 30

## 2020-03-01 SURGICAL SUPPLY — 78 items
ADH SKN CLS APL DERMABOND .7 (GAUZE/BANDAGES/DRESSINGS)
ANCH SUT SWLK 24.5 SLF PNCH VT (Anchor) ×1 IMPLANT
ANCHOR BIOCOMP SWIVELOCK (Anchor) ×1 IMPLANT
APL SKNCLS STERI-STRIP NONHPOA (GAUZE/BANDAGES/DRESSINGS)
BENZOIN TINCTURE PRP APPL 2/3 (GAUZE/BANDAGES/DRESSINGS) IMPLANT
BLADE SURG 15 STRL LF DISP TIS (BLADE) IMPLANT
BLADE SURG 15 STRL SS (BLADE)
BURR OVAL 8 FLU 4.0X13 (MISCELLANEOUS) ×2 IMPLANT
CANNULA 5.75X71 LONG (CANNULA) ×2 IMPLANT
CANNULA SHOULDER 7CM (CANNULA) ×2 IMPLANT
CANNULA TWIST IN 8.25X7CM (CANNULA) ×1 IMPLANT
COOLER ICEMAN CLASSIC (MISCELLANEOUS) ×2 IMPLANT
COVER WAND RF STERILE (DRAPES) IMPLANT
DECANTER SPIKE VIAL GLASS SM (MISCELLANEOUS) IMPLANT
DERMABOND ADVANCED (GAUZE/BANDAGES/DRESSINGS)
DERMABOND ADVANCED .7 DNX12 (GAUZE/BANDAGES/DRESSINGS) IMPLANT
DISSECTOR  3.8MM X 13CM (MISCELLANEOUS) ×2
DISSECTOR 3.8MM X 13CM (MISCELLANEOUS) ×1 IMPLANT
DRAPE IMP U-DRAPE 54X76 (DRAPES) ×2 IMPLANT
DRAPE INCISE IOBAN 66X45 STRL (DRAPES) ×2 IMPLANT
DRAPE STERI 35X30 U-POUCH (DRAPES) ×2 IMPLANT
DRAPE U-SHAPE 47X51 STRL (DRAPES) ×2 IMPLANT
DRAPE U-SHAPE 76X120 STRL (DRAPES) ×4 IMPLANT
DRSG PAD ABDOMINAL 8X10 ST (GAUZE/BANDAGES/DRESSINGS) ×2 IMPLANT
DURAPREP 26ML APPLICATOR (WOUND CARE) ×2 IMPLANT
ELECT REM PT RETURN 9FT ADLT (ELECTROSURGICAL) ×2
ELECTRODE REM PT RTRN 9FT ADLT (ELECTROSURGICAL) ×1 IMPLANT
FIBER TAPE 2MM (SUTURE) IMPLANT
GAUZE SPONGE 4X4 12PLY STRL (GAUZE/BANDAGES/DRESSINGS) ×2 IMPLANT
GAUZE XEROFORM 1X8 LF (GAUZE/BANDAGES/DRESSINGS) ×2 IMPLANT
GLOVE BIOGEL PI IND STRL 7.0 (GLOVE) ×1 IMPLANT
GLOVE BIOGEL PI INDICATOR 7.0 (GLOVE) ×1
GLOVE SKINSENSE NS SZ7.5 (GLOVE) ×1
GLOVE SKINSENSE STRL SZ7.5 (GLOVE) ×1 IMPLANT
GLOVE SURG LTX SZ7 (GLOVE) ×2 IMPLANT
GLOVE SURG SS PI 7.5 STRL IVOR (GLOVE) ×4 IMPLANT
GLOVE SURG SYN 7.5  E (GLOVE) ×2
GLOVE SURG SYN 7.5 E (GLOVE) ×1 IMPLANT
GLOVE SURG SYN 7.5 PF PI (GLOVE) ×1 IMPLANT
GOWN STRL REIN XL XLG (GOWN DISPOSABLE) ×2 IMPLANT
GOWN STRL REUS W/ TWL LRG LVL3 (GOWN DISPOSABLE) ×1 IMPLANT
GOWN STRL REUS W/ TWL XL LVL3 (GOWN DISPOSABLE) ×1 IMPLANT
GOWN STRL REUS W/TWL LRG LVL3 (GOWN DISPOSABLE) ×2
GOWN STRL REUS W/TWL XL LVL3 (GOWN DISPOSABLE) ×2
MANIFOLD NEPTUNE II (INSTRUMENTS) ×2 IMPLANT
NDL SCORPION MULTI FIRE (NEEDLE) IMPLANT
NDL SUT 6 .5 CRC .975X.05 MAYO (NEEDLE) IMPLANT
NEEDLE MAYO TAPER (NEEDLE)
NEEDLE SCORPION MULTI FIRE (NEEDLE) ×2 IMPLANT
PACK ARTHROSCOPY DSU (CUSTOM PROCEDURE TRAY) ×2 IMPLANT
PACK BASIN DAY SURGERY FS (CUSTOM PROCEDURE TRAY) ×2 IMPLANT
PAD COLD SHLDR WRAP-ON (PAD) ×2 IMPLANT
PORT APPOLLO RF 90DEGREE MULTI (SURGICAL WAND) ×2 IMPLANT
SHEET MEDIUM DRAPE 40X70 STRL (DRAPES) ×2 IMPLANT
SLEEVE SCD COMPRESS KNEE MED (MISCELLANEOUS) ×2 IMPLANT
SLING ARM FOAM STRAP LRG (SOFTGOODS) IMPLANT
STRIP CLOSURE SKIN 1/2X4 (GAUZE/BANDAGES/DRESSINGS) IMPLANT
SUT ETHILON 3 0 PS 1 (SUTURE) ×2 IMPLANT
SUT FIBERWIRE #2 38 T-5 BLUE (SUTURE)
SUT MNCRL AB 4-0 PS2 18 (SUTURE) ×2 IMPLANT
SUT PDS AB 1 CT  36 (SUTURE)
SUT PDS AB 1 CT 36 (SUTURE) IMPLANT
SUT TIGER TAPE 7 IN WHITE (SUTURE) IMPLANT
SUT VIC AB 0 CT1 27 (SUTURE) ×2
SUT VIC AB 0 CT1 27XCR 8 STRN (SUTURE) ×1 IMPLANT
SUT VIC AB 2-0 CT1 27 (SUTURE) ×2
SUT VIC AB 2-0 CT1 TAPERPNT 27 (SUTURE) ×1 IMPLANT
SUTURE FIBERWR #2 38 T-5 BLUE (SUTURE) IMPLANT
SUTURE TAPE 1.3 40 TPR END (SUTURE) IMPLANT
SUTURE TAPE TIGERLINK 1.3MM BL (SUTURE) IMPLANT
SUTURETAPE 1.3 40 TPR END (SUTURE)
SUTURETAPE TIGERLINK 1.3MM BL (SUTURE)
SYR TOOMEY 50ML (SYRINGE) IMPLANT
TAPE FIBER 2MM 7IN #2 BLUE (SUTURE) IMPLANT
TOWEL GREEN STERILE FF (TOWEL DISPOSABLE) ×4 IMPLANT
TUBE CONNECTING 20X1/4 (TUBING) IMPLANT
TUBING ARTHROSCOPY IRRIG 16FT (MISCELLANEOUS) IMPLANT
WATER STERILE IRR 1000ML POUR (IV SOLUTION) ×2 IMPLANT

## 2020-03-01 NOTE — Anesthesia Procedure Notes (Signed)
Procedure Name: LMA Insertion Performed by: Lya Holben M, CRNA Pre-anesthesia Checklist: Patient identified, Emergency Drugs available, Suction available and Patient being monitored Patient Re-evaluated:Patient Re-evaluated prior to induction Oxygen Delivery Method: Circle system utilized Preoxygenation: Pre-oxygenation with 100% oxygen Induction Type: IV induction Ventilation: Mask ventilation without difficulty LMA: LMA inserted LMA Size: 4.0 Number of attempts: 1 Airway Equipment and Method: Bite block Placement Confirmation: positive ETCO2 Tube secured with: Tape Dental Injury: Teeth and Oropharynx as per pre-operative assessment        

## 2020-03-01 NOTE — Anesthesia Postprocedure Evaluation (Signed)
Anesthesia Post Note  Patient: Jimmy [name]  Procedure(s) Performed: LEFT SHOULDER ARTHROSCOPY WITH BICEPS TENODESIS (Left Shoulder)     Patient location during evaluation: PACU Anesthesia Type: General Level of consciousness: awake and alert Pain management: pain level controlled Vital Signs Assessment: post-procedure vital signs reviewed and stable Respiratory status: spontaneous breathing, nonlabored ventilation and respiratory function stable Cardiovascular status: blood pressure returned to baseline and stable Postop Assessment: no apparent nausea or vomiting Anesthetic complications: no   No complications documented.  Last Vitals:  Vitals:   03/01/20 1400 03/01/20 1420  BP: 117/68 (!) 142/92  Pulse: 83 78  Resp: 16 18  Temp:  36.5 C  SpO2: 97% 99%    Last Pain:  Vitals:   03/01/20 1420  TempSrc:   PainSc: 0-No pain                 Lynda Rainwater

## 2020-03-01 NOTE — Anesthesia Procedure Notes (Signed)
Anesthesia Regional Block: Interscalene brachial plexus block   Pre-Anesthetic Checklist: ,, timeout performed, Correct Patient, Correct Site, Correct Laterality, Correct Procedure, Correct Position, site marked, Risks and benefits discussed,  Surgical consent,  Pre-op evaluation,  At surgeon's request and post-op pain management  Laterality: Left  Prep: chloraprep       Needles:  Injection technique: Single-shot  Needle Type: Stimiplex     Needle Length: 9cm  Needle Gauge: 21     Additional Needles:   Procedures:,,,, ultrasound used (permanent image in chart),,,,  Narrative:  Start time: 03/01/2020 10:37 AM End time: 03/01/2020 10:42 AM Injection made incrementally with aspirations every 5 mL.  Performed by: Personally

## 2020-03-01 NOTE — Discharge Instructions (Signed)
Post Anesthesia Home Care Instructions  Activity: Get plenty of rest for the remainder of the day. A responsible individual must stay with you for 24 hours following the procedure.  For the next 24 hours, DO NOT: -Drive a car -Paediatric nurse -Drink alcoholic beverages -Take any medication unless instructed by your physician -Make any legal decisions or sign important papers.  Meals: Start with liquid foods such as gelatin or soup. Progress to regular foods as tolerated. Avoid greasy, spicy, heavy foods. If nausea and/or vomiting occur, drink only clear liquids until the nausea and/or vomiting subsides. Call your physician if vomiting continues.  Special Instructions/Symptoms: Your throat may feel dry or sore from the anesthesia or the breathing tube placed in your throat during surgery. If this causes discomfort, gargle with warm salt water. The discomfort should disappear within 24 hours.  If you had a scopolamine patch placed behind your ear for the management of post- operative nausea and/or vomiting:  1. The medication in the patch is effective for 72 hours, after which it should be removed.  Wrap patch in a tissue and discard in the trash. Wash hands thoroughly with soap and water. 2. You may remove the patch earlier than 72 hours if you experience unpleasant side effects which may include dry mouth, dizziness or visual disturbances. 3. Avoid touching the patch. Wash your hands with soap and water after contact with the patch.            Post-operative patient instructions  Shoulder Arthroscopy   . Ice:  Place intermittent ice or cooler pack over your shoulder, 30 minutes on and 30 minutes off.  Continue this for the first 72 hours after surgery, then save ice for use after therapy sessions or on more active days.   . Weight:  You may bear weight on your arm as your symptoms allow. . Motion:  Perform gentle shoulder motion as tolerated . Dressing:  Perform 1st dressing  change at 2 days postoperative. A moderate amount of blood tinged drainage is to be expected.  So if you bleed through the dressing on the first or second day or if you have fevers, it is fine to change the dressing/check the wounds early and redress wound.  If it bleeds through again, or if the incisions are leaking frank blood, please call the office. May change dressing every 1-2 days thereafter to help watch wounds. Can purchase Tegaderm (or 33M Nexcare) water resistant dressings at local pharmacy / [facility]. . Shower:  Light shower is ok after 2 days.  Please take shower, NO bath. Recover with gauze and ace wrap to help keep wounds protected.   . Pain medication:  A narcotic pain medication has been prescribed.  Take as directed.  Typically you need narcotic pain medication more regularly during the first 3 to 5 days after surgery.  Decrease your use of the medication as the pain improves.  Narcotics can sometimes cause constipation, even after a few doses.  If you have problems with constipation, you can take an over the counter stool softener or light laxative.  If you have persistent problems, please notify your physician's office. Marland Kitchen Physical therapy: Additional activity guidelines to be provided by your physician or physical therapist at follow-up visits.  . Driving: Do not recommend driving x 2 weeks post surgical, especially if surgery performed on right side. Should not drive while taking narcotic pain medications. It typically takes at least 2 weeks to restore sufficient neuromuscular function for normal reaction  times for driving safety.  . Call [phone] for questions or problems. Evenings you will be forwarded to the hospital operator.  Ask for the orthopaedic physician on call. Please call if you experience:    o Redness, foul smelling, or persistent drainage from the surgical site  o worsening shoulder pain and swelling not responsive to medication  o any calf pain and or swelling of the  lower leg  o temperatures greater than 101.5 F o other questions or concerns   Thank you for allowing [place] to be a part of your care.

## 2020-03-01 NOTE — H&P (Signed)
PREOPERATIVE H&P  Chief Complaint: left shoulder labral tear  HPI: Jimmy [name] is a 29 y.o. male who presents for surgical treatment of left shoulder labral tear.  He denies any changes in medical history.  Past Medical History:  Diagnosis Date  . ADHD (attention deficit hyperactivity disorder)   . Anxiety   . Arthritis   . Cancer ([place])    skin cancer  . Chest pain 08/2018  . GERD (gastroesophageal reflux disease)    no meds  . Migraine    migraines due to pinched nerve  . Pinched nerve in neck   . Scoliosis    Past Surgical History:  Procedure Laterality Date  . ESOPHAGUS SURGERY     as a baby  . SHOULDER ARTHROSCOPY WITH SUBACROMIAL DECOMPRESSION Right 09/01/2019   Procedure: RIGHT SHOULDER ARTHROSCOPY WITH EXTENSIVE DEBRIDEMENT AND SUBACROMIAL DECOMPRESSION;  Surgeon: Leandrew Koyanagi, MD;  Location: [facility];  Service: Orthopedics;  Laterality: Right;  . VENTRAL HERNIA REPAIR N/A 08/31/2018   Procedure: HERNIA REPAIR VENTRAL ADULT;  Surgeon: Olean Ree, MD;  Location: [facility];  Service: General;  Laterality: N/A;   Social History   Socioeconomic History  . Marital status: Married    Spouse name: Not on file  . Number of children: Not on file  . Years of education: Not on file  . Highest education level: Not on file  Occupational History  . Not on file  Tobacco Use  . Smoking status: Current Every Day Smoker    Packs/day: 1.00    Years: 15.00    Pack years: 15.00    Types: Cigarettes  . Smokeless tobacco: Former Network engineer  . Vaping Use: Former  Substance and Sexual Activity  . Alcohol use: Not Currently  . Drug use: Not Currently  . Sexual activity: Yes  Other Topics Concern  . Not on file  Social History Narrative   Right handed   One story home    Drinks caffeine    Social Determinants of Health   Financial Resource Strain: Not on file  Food Insecurity: Not on file  Transportation Needs: Not on file  Physical  Activity: Not on file  Stress: Not on file  Social Connections: Not on file   Family History  Problem Relation Age of Onset  . Diabetes Mother   . Kidney cancer Mother   . Diabetes Maternal Grandmother   . Cancer Maternal Grandmother        unknown type. doing chemo  . Cancer Maternal Grandfather   . Diabetes Maternal Aunt   . Stomach cancer Neg Hx   . Pancreatic cancer Neg Hx   . Esophageal cancer Neg Hx   . Liver disease Neg Hx   . Colon cancer Neg Hx   . Rectal cancer Neg Hx    Allergies  Allergen Reactions  . Cyclobenzaprine Hives and Nausea Only  . Depakote [Divalproex Sodium] Other (See Comments)    "made him crazy"  . Ritalin [Methylphenidate Hcl] Other (See Comments)    Increased hyper activeness   . Naproxen Nausea Only    Acid reflux  . Other Other (See Comments)    [facility]-pt states skin became reddened and he tasted metal (08-2018)  . Tramadol Other (See Comments)    Acid reflux   Prior to Admission medications   Medication Sig Start Date End Date Taking? Authorizing Provider  Erenumab-aooe (AIMOVIG) 70 MG/ML SOAJ Inject 70 mg into the skin every 28 (  twenty-eight) days. 08/25/19  Yes Jaffe, Adam R, DO  nicotine (NICODERM CQ) 14 mg/24hr patch Place 1 patch (14 mg total) onto the skin daily. 04/26/19  Yes Charlott Rakes, MD  QUEtiapine (SEROQUEL) 100 MG tablet Take 1 tablet (100 mg total) by mouth at bedtime. 07/26/19  Yes Newlin, Charlane Ferretti, MD  Ubrogepant (UBRELVY) 100 MG TABS Take 1 tablet by mouth as needed (May repeat 1 tablet in 2 hours.  Maximum 2 tablets in 24 hours.). 01/18/20  Yes Jaffe, Adam R, DO  albuterol (VENTOLIN HFA) 108 (90 Base) MCG/ACT inhaler Inhale 1-2 puffs into the lungs every 6 (six) hours as needed for wheezing or shortness of breath. 11/14/19   Avegno, Darrelyn Hillock, FNP  famotidine (PEPCID) 40 MG tablet Take 1 tablet (40 mg total) by mouth daily. 08/26/18 12/28/19  Robyn Haber, MD     Positive ROS: All other systems have been reviewed and were  otherwise negative with the exception of those mentioned in the HPI and as above.  Physical Exam: General: Alert, no acute distress Cardiovascular: No pedal edema Respiratory: No cyanosis, no use of accessory musculature GI: abdomen soft Skin: No lesions in the area of chief complaint Neurologic: Sensation intact distally Psychiatric: Patient is competent for consent with normal mood and affect Lymphatic: no lymphedema  MUSCULOSKELETAL: exam stable  Assessment: left shoulder labral tear  Plan: Plan for Procedure(s): LEFT SHOULDER ARTHROSCOPY WITH BICEPS TENODESIS  The risks benefits and alternatives were discussed with the patient including but not limited to the risks of nonoperative treatment, versus surgical intervention including infection, bleeding, nerve injury,  blood clots, cardiopulmonary complications, morbidity, mortality, among others, and they were willing to proceed.   Preoperative templating of the joint replacement has been completed, documented, and submitted to the Operating Room personnel in order to optimize intra-operative equipment management.   Eduard Roux, MD 03/01/2020 9:54 AM

## 2020-03-01 NOTE — Progress Notes (Signed)
Assisted Dr. Miller with left, ultrasound guided, interscalene  block. Side rails up, monitors on throughout procedure. See vital signs in flow sheet. Tolerated Procedure well.  

## 2020-03-01 NOTE — Transfer of Care (Signed)
Immediate Anesthesia Transfer of Care Note  Patient: Jimmy [name]  Procedure(s) Performed: LEFT SHOULDER ARTHROSCOPY WITH BICEPS TENODESIS (Left Shoulder)  Patient Location: PACU  Anesthesia Type:General and regional  Level of Consciousness: awake, alert  and oriented  Airway & Oxygen Therapy: Patient Spontanous Breathing and Patient connected to face mask oxygen  Post-op Assessment: Report given to RN and Post -op Vital signs reviewed and stable  Post vital signs: Reviewed and stable  Last Vitals:  Vitals Value Taken Time  BP    Temp    Pulse    Resp    SpO2      Last Pain:  Vitals:   03/01/20 0958  TempSrc: Oral  PainSc: 7       Patients Stated Pain Goal: 6 (84/16/60 6301)  Complications: No complications documented.

## 2020-03-01 NOTE — Anesthesia Preprocedure Evaluation (Signed)
Anesthesia Evaluation  Patient identified by MRN, date of birth, ID band Patient awake    Reviewed: Allergy & Precautions, NPO status , Patient's Chart, lab work & pertinent test results, reviewed documented beta blocker date and time   Airway Mallampati: II  TM Distance: >3 FB Neck ROM: Full    Dental no notable dental hx. (+) Chipped   Pulmonary Current Smoker and Patient abstained from smoking.,    Pulmonary exam normal breath sounds clear to auscultation       Cardiovascular Normal cardiovascular exam Rhythm:Regular Rate:Normal     Neuro/Psych  Headaches, PSYCHIATRIC DISORDERS Anxiety  Neuromuscular disease    GI/Hepatic GERD  Controlled,  Endo/Other    Renal/GU      Musculoskeletal  (+) Arthritis , Osteoarthritis,    Abdominal   Peds  Hematology   Anesthesia Other Findings ADHD. Hx of opiates and cocaine.  Reproductive/Obstetrics                             Anesthesia Physical  Anesthesia Plan  ASA: II  Anesthesia Plan: General   Post-op Pain Management:  Regional for Post-op pain   Induction: Intravenous  PONV Risk Score and Plan: 1 and Ondansetron and Treatment may vary due to age or medical condition  Airway Management Planned: LMA  Additional Equipment:   Intra-op Plan:   Post-operative Plan: Extubation in OR  Informed Consent: I have reviewed the patients History and Physical, chart, labs and discussed the procedure including the risks, benefits and alternatives for the proposed anesthesia with the patient or authorized representative who has indicated his/her understanding and acceptance.     Dental advisory given  Plan Discussed with: CRNA  Anesthesia Plan Comments:         Anesthesia Quick Evaluation

## 2020-03-01 NOTE — Op Note (Signed)
   Date of Surgery: 03/01/2020  INDICATIONS: The patient is a 29 year old male with left shoulder pain that has failed conservative treatment;  The patient did consent to the procedure after discussion of the risks and benefits.  PREOPERATIVE DIAGNOSIS:  1.  Type 2 SLAP tear, left shoulder  POSTOPERATIVE DIAGNOSIS: Same.  PROCEDURE:  1. Arthroscopic left biceps tenodesis  SURGEON: N. Eduard Roux, M.D.  ASSIST: Ciro Backer [place], [place]; necessary for the timely completion of procedure and due to complexity of procedure..  ANESTHESIA:  general, regional  IV FLUIDS AND URINE: See anesthesia.  ESTIMATED BLOOD LOSS: minimal mL.  IMPLANTS: Arthrex 4.75 mm swivel lock  COMPLICATIONS: None.  DESCRIPTION OF PROCEDURE: The patient was brought to the operating room and placed supine on the operating table.  The patient had been signed prior to the procedure and this was documented. The patient had the anesthesia placed by the anesthesiologist.  A time-out was performed to confirm that this was the correct patient, site, side and location. The patient did receive antibiotics prior to the incision and was re-dosed during the procedure as needed at indicated intervals.  The patient was then positioned into the beach chair position with all bony prominences well padded and neutral C spine. The patient had the operative extremity prepped and draped in the standard surgical fashion.    2 incisions were made for standard arthroscopic portals.  Diagnostic shoulder arthroscopy was first performed.  He had mild degenerative posterior labral tears which were gently debrided using oscillating shaver.  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  The humeral surface was unremarkable.  The articular surface of the rotator cuff was unremarkable.  He did have a type II SLAP tear upon examination.  We then performed biceps tenodesis arthroscopically using the luggage tag technique which  was anchored into the bicipital groove superior to the subscapularis tendon using a self punching 4.75 mm swivel lock.  The remaining labrum was gently debrided back to stable tissue.  Excess fluid was drained from the shoulder joint.  Incisions were closed with interrupted nylon sutures.  Sterile dressings were applied.  Shoulder sling placed.  Patient tolerated procedure well.  POSTOPERATIVE PLAN: Sling for comfort.  Increase activity and use of the left shoulder as tolerated.  Avoid lifting more than 10 pounds for 6 weeks.  Follow-up in approximately 10 days for suture removal.  Azucena Cecil, MD [facility] [phone] 1:09 PM

## 2020-03-02 ENCOUNTER — Encounter (HOSPITAL_BASED_OUTPATIENT_CLINIC_OR_DEPARTMENT_OTHER): Payer: Self-pay | Admitting: Orthopaedic Surgery

## 2020-03-07 ENCOUNTER — Encounter: Payer: Self-pay | Admitting: Orthopaedic Surgery

## 2020-03-07 ENCOUNTER — Ambulatory Visit (INDEPENDENT_AMBULATORY_CARE_PROVIDER_SITE_OTHER): Payer: No Typology Code available for payment source | Admitting: Physician Assistant

## 2020-03-07 ENCOUNTER — Other Ambulatory Visit: Payer: Self-pay

## 2020-03-07 DIAGNOSIS — Z9889 Other specified postprocedural states: Secondary | ICD-10-CM

## 2020-03-07 MED ORDER — METHOCARBAMOL 500 MG PO TABS: 500.0000 mg | ORAL_TABLET | Freq: Two times a day (BID) | ORAL | 0 refills | Status: DC | PRN

## 2020-03-07 MED ORDER — OXYCODONE-ACETAMINOPHEN 5-325 MG PO TABS: 1.0000 | ORAL_TABLET | Freq: Four times a day (QID) | ORAL | 0 refills | Status: DC | PRN

## 2020-03-07 NOTE — Progress Notes (Signed)
Post-Op Visit Note   Patient: Jimmy [name]           Date of Birth: 08-11-1990           MRN: [id number] Visit Date: 03/07/2020 PCP: Charlott Rakes, MD   Assessment & Plan:  Chief Complaint:  Chief Complaint  Patient presents with  . Left Shoulder - Routine Post Op, Follow-up   Visit Diagnoses:  1. S/P arthroscopy of left shoulder     Plan: Patient is a pleasant 29 year old gentleman who comes in today 1 week out left shoulder arthroscopic debridement and biceps tenodesis.  He has been doing fairly well but has been in a moderate amount of pain.  He has been taking 1-2 Percocets every 8 hours without complete of symptoms.  Examination of the left shoulder reveals fully healed surgical portals without evidence of infection or cellulitis.  Fingers are warm well perfused.  Today, sutures were removed and Steri-Strips applied.  I have refilled his Percocet and have written it to take every 6 hours as needed.  I have also called in Robaxin.  I have sent in an internal referral for physical therapy.  We have provided him with a note to be out of work until January 4 and then he may return lifting no more than 10 pounds for another 4 weeks.  He will follow up with [place] in 5 weeks time for repeat evaluation.  Call with concerns or questions.  Follow-Up Instructions: Return in about 5 weeks (around 04/11/2020).   Orders:  Orders Placed This Encounter  Procedures  . Ambulatory referral to Physical Therapy   Meds ordered this encounter  Medications  . methocarbamol (ROBAXIN) 500 MG tablet    Sig: Take 1 tablet (500 mg total) by mouth 2 (two) times daily as needed.    Dispense:  20 tablet    Refill:  0  . oxyCODONE-acetaminophen (PERCOCET) 5-325 MG tablet    Sig: Take 1-2 tablets by mouth every 6 (six) hours as needed.    Dispense:  40 tablet    Refill:  0    Imaging: No new imaging  PMFS History: Patient Active Problem List   Diagnosis Date Noted  . History of Abnormal LFTs  (liver function tests) 12/30/2019  . Abnormal liver ultrasound 12/30/2019  . Fatty liver 12/30/2019  . Right flank pain 12/30/2019  . Abdominal wall pain 12/30/2019  . Gastroesophageal reflux disease without esophagitis 12/30/2019  . 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 Carpal tunnel syndrome 09/08/2019  . Bursitis of shoulder, right 08/31/2019  . Incisional hernia, without obstruction or gangrene 05/20/2018  . Opiate dependence ([place]) 08/02/2013   Past Medical History:  Diagnosis Date  . ADHD (attention deficit hyperactivity disorder)   . Anxiety   . Arthritis   . Cancer ([place])    skin cancer  . Chest pain 08/2018  . GERD (gastroesophageal reflux disease)    no meds  . Migraine    migraines due to pinched nerve  . Pinched nerve in neck   . Scoliosis     Family History  Problem Relation Age of Onset  . Diabetes Mother   . Kidney cancer Mother   . Diabetes Maternal Grandmother   . Cancer Maternal Grandmother        unknown type. doing chemo  . Cancer Maternal Grandfather   . Diabetes Maternal Aunt   . Stomach cancer Neg Hx   . Pancreatic cancer Neg Hx   .  Esophageal cancer Neg Hx   . Liver disease Neg Hx   . Colon cancer Neg Hx   . Rectal cancer Neg Hx     Past Surgical History:  Procedure Laterality Date  . ESOPHAGUS SURGERY     as a baby  . SHOULDER ARTHROSCOPY WITH BICEPS TENDON REPAIR Left 03/01/2020   Procedure: LEFT SHOULDER ARTHROSCOPY WITH BICEPS TENODESIS;  Surgeon: Leandrew Koyanagi, MD;  Location: [facility];  Service: Orthopedics;  Laterality: Left;  . SHOULDER ARTHROSCOPY WITH SUBACROMIAL DECOMPRESSION Right 09/01/2019   Procedure: RIGHT SHOULDER ARTHROSCOPY WITH EXTENSIVE DEBRIDEMENT AND SUBACROMIAL DECOMPRESSION;  Surgeon: Leandrew Koyanagi, MD;  Location: [facility];  Service: Orthopedics;  Laterality: Right;  . VENTRAL HERNIA REPAIR N/A 08/31/2018   Procedure: HERNIA REPAIR VENTRAL ADULT;   Surgeon: Olean Ree, MD;  Location: [facility];  Service: General;  Laterality: N/A;   Social History   Occupational History  . Not on file  Tobacco Use  . Smoking status: Current Every Day Smoker    Packs/day: 1.00    Years: 15.00    Pack years: 15.00    Types: Cigarettes  . Smokeless tobacco: Former Network engineer  . Vaping Use: Former  Substance and Sexual Activity  . Alcohol use: Not Currently  . Drug use: Not Currently  . Sexual activity: Yes

## 2020-03-08 ENCOUNTER — Inpatient Hospital Stay: Payer: No Typology Code available for payment source | Admitting: Orthopaedic Surgery

## 2020-03-13 ENCOUNTER — Ambulatory Visit: Payer: No Typology Code available for payment source | Admitting: Physical Therapy

## 2020-03-16 ENCOUNTER — Ambulatory Visit: Payer: No Typology Code available for payment source | Admitting: Physical Therapy

## 2020-03-16 ENCOUNTER — Other Ambulatory Visit: Payer: Self-pay

## 2020-03-16 ENCOUNTER — Other Ambulatory Visit: Payer: Self-pay | Admitting: Family Medicine

## 2020-03-16 ENCOUNTER — Encounter: Payer: Self-pay | Admitting: Family Medicine

## 2020-03-16 ENCOUNTER — Encounter: Payer: Self-pay | Admitting: Rehabilitative and Restorative Service Providers"

## 2020-03-16 ENCOUNTER — Ambulatory Visit (INDEPENDENT_AMBULATORY_CARE_PROVIDER_SITE_OTHER): Payer: No Typology Code available for payment source | Admitting: Rehabilitative and Restorative Service Providers"

## 2020-03-16 DIAGNOSIS — R293 Abnormal posture: Secondary | ICD-10-CM | POA: Diagnosis not present

## 2020-03-16 DIAGNOSIS — M25512 Pain in left shoulder: Secondary | ICD-10-CM

## 2020-03-16 DIAGNOSIS — M25612 Stiffness of left shoulder, not elsewhere classified: Secondary | ICD-10-CM

## 2020-03-16 DIAGNOSIS — F3161 Bipolar disorder, current episode mixed, mild: Secondary | ICD-10-CM

## 2020-03-16 DIAGNOSIS — M6281 Muscle weakness (generalized): Secondary | ICD-10-CM

## 2020-03-16 MED ORDER — QUETIAPINE FUMARATE 100 MG PO TABS: 100.0000 mg | ORAL_TABLET | Freq: Every day | ORAL | 6 refills | Status: DC

## 2020-03-16 NOTE — Therapy (Signed)
[facility] [address] [place], [name], [address] Phone: [phone]   Fax:  [phone]  Physical Therapy Evaluation  Patient Details  Name: Jimmy [name] MRN: [id number] Date of Birth: 02/12/1991 Referring Provider (PT): Gregor Hams MD   Encounter Date: 03/16/2020   PT End of Session - 03/16/20 1607    Visit Number 1    Number of Visits 20    PT Start Time [id number]    PT Stop Time [id number]    PT Time Calculation (min) 30 min    Activity Tolerance Patient tolerated treatment well;No increased pain    Behavior During Therapy WFL for tasks assessed/performed           Past Medical History:  Diagnosis Date  . ADHD (attention deficit hyperactivity disorder)   . Anxiety   . Arthritis   . Cancer ([place])    skin cancer  . Chest pain 08/2018  . GERD (gastroesophageal reflux disease)    no meds  . Migraine    migraines due to pinched nerve  . Pinched nerve in neck   . Scoliosis     Past Surgical History:  Procedure Laterality Date  . ESOPHAGUS SURGERY     as a baby  . SHOULDER ARTHROSCOPY WITH BICEPS TENDON REPAIR Left 03/01/2020   Procedure: LEFT SHOULDER ARTHROSCOPY WITH BICEPS TENODESIS;  Surgeon: Leandrew Koyanagi, MD;  Location: [facility];  Service: Orthopedics;  Laterality: Left;  . SHOULDER ARTHROSCOPY WITH SUBACROMIAL DECOMPRESSION Right 09/01/2019   Procedure: RIGHT SHOULDER ARTHROSCOPY WITH EXTENSIVE DEBRIDEMENT AND SUBACROMIAL DECOMPRESSION;  Surgeon: Leandrew Koyanagi, MD;  Location: [facility] [place];  Service: Orthopedics;  Laterality: Right;  . VENTRAL HERNIA REPAIR N/A 08/31/2018   Procedure: HERNIA REPAIR VENTRAL ADULT;  Surgeon: Olean Ree, MD;  Location: [facility];  Service: General;  Laterality: N/A;    There were no vitals filed for this visit.    Subjective Assessment - 03/16/20 1602    Subjective Jimmy [name] had a L shoulder arthroscopy to address a 7-12 o'clock labral tear and a biceps tenodesis on  03/01/20.  AROM is limited.  Strength and function are also impaired.  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    Limitations House hold activities    Patient Stated Goals Get back to normal and return to work full-duty.    Currently in Pain? Yes    Pain Score 5     Pain Location Shoulder    Pain Orientation Left    Pain Descriptors / Indicators Tightness;Aching;Burning;Sore    Pain Type Surgical pain    Pain Onset 1 to 4 weeks ago    Pain Frequency Intermittent    Aggravating Factors  L UE use    Pain Relieving Factors Meds    Effect of Pain on Daily Activities Unable to lift > 10 pounds (less encouraged) and unable to work Architect full duty    Multiple Pain Sites No              [facility] PT Assessment - 03/16/20 0001      Assessment   Medical Diagnosis L shoulder arthroscopy with biceps tendosesis    Referring Provider (PT) Gregor Hams MD    Onset Date/Surgical Date 03/01/20    Next MD Visit ~ 5 weeks (week of February 1st)      Balance Screen   Has the patient fallen in the past 6  months No    Has the patient had a decrease in activity level because of a fear of falling?  No    Is the patient reluctant to leave their home because of a fear of falling?  No      Prior Function   Level of Independence Independent    Vocation Full time employment    Vocation Requirements Heavy physical labor Engineer, materials)      Cognition   Overall Cognitive Status Within Functional Limits for tasks assessed      Observation/Other Assessments   Focus on Therapeutic Outcomes (FOTO)  44 (Goal is 66)      ROM / Strength   AROM / PROM / Strength AROM      AROM   Overall AROM  Deficits    AROM Assessment Site Shoulder    Right/Left Shoulder Left;Right    Right Shoulder Flexion 170 Degrees    Right Shoulder Internal Rotation 60 Degrees    Right Shoulder External Rotation 90 Degrees    Right Shoulder Horizontal   ADduction 45 Degrees    Left Shoulder Flexion 135 Degrees    Left Shoulder Internal Rotation 45 Degrees    Left Shoulder External Rotation 40 Degrees    Left Shoulder Horizontal ADduction 10 Degrees                      Objective measurements completed on examination: See above findings.       [facility] Adult PT Treatment/Exercise - 03/16/20 0001      Therapeutic Activites    Therapeutic Activities ADL's    ADL's Discussed avoiding excessive ER, lifting anything with any weight and going slow and conservative with the L shoulder until PT or MD recommend progressions.      Exercises   Exercises Shoulder      Shoulder Exercises: Supine   Protraction Strengthening;Both;20 reps;Other (comment)    Protraction Weight (lbs) 0# (1-2# next visit)      Shoulder Exercises: Standing   Retraction Strengthening;Both;10 reps    Retraction Limitations 5 seconds shoulder blade pinches    Other Standing Exercises Codmans F/B; L/R; CW; CCW 20X each                  PT Education - 03/16/20 1605    Education Details Discussed exam findings, plan of care, time frame expected for return to more normal activities (3 months post-surgery).  Reviewed starter HEP.    Person(s) Educated Patient    Methods Explanation;Demonstration;Verbal cues;Handout    Comprehension Verbal cues required;Need further instruction;Returned demonstration;Verbalized understanding               PT Long Term Goals - 03/16/20 1614      PT LONG TERM GOAL #1   Title Improve FOTO to 66.    Baseline 44    Time 10    Period Weeks    Status New    Target Date 06/15/20      PT LONG TERM GOAL #2   Title Improve L shoulder AROM to 100% of the uninvolved R.    Baseline See objective.    Time 10    Period Weeks    Status New    Target Date 06/15/20      PT LONG TERM GOAL #3   Title Improve RTC strength to 5/5 MMT for ER and IR.    Baseline Deferred due to surgery just over 2 weeks ago.    Time 10  Period Weeks    Status New    Target Date 06/15/20      PT LONG TERM GOAL #4   Title Jimmy [name] will report L shoulder pain consistently 0-3/10 on the Numeric Pain Rating Scale.    Baseline 5+/10    Time 10    Period Weeks    Status New    Target Date 06/15/20      PT LONG TERM GOAL #5   Title Jimmy [name] will be independent with his long-term HEP at [place].    Time 10    Period Weeks    Status New    Target Date 06/15/20                  Plan - 03/16/20 1608    Clinical Impression Statement Jimmy [name] had a L shoulder arthroplasty with biceps tenodesis 03/01/2020.  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  Some guarding and capsular tightness is present.  Strength testing was deferred secondary to being just over 2 weeks post-surgery.  Discussed exam findings, plan, prognosis and the importance of adhering to limitations with lifting only light weights (nothing heavier than a coffee cup PT recommended) and avoiding excessive ER.  Emphasis on return of capsular motion with slow progression of ER motion given big anterior tear from 7-12 o'clock.  Scapular strength and gentle RTC strengthening will also be progressed.  Avoid resisted elbow flexion or supination for now (biceps tenodesis).    Personal Factors and Comorbidities Comorbidity 1    Comorbidities Smoker    Examination-Activity Limitations Bathing;Dressing;Sleep;Hygiene/Grooming;Bed Mobility;Carry;Reach Overhead    Examination-Participation Restrictions Occupation;Community Activity    Stability/Clinical Decision Making Stable/Uncomplicated    Clinical Decision Making Low    Rehab Potential Good    PT Frequency 2x / week    PT Duration Other (comment)   10 weeks   PT Treatment/Interventions ADLs/Self Care Home Management;Iontophoresis 4mg /ml Dexamethasone;Cryotherapy;Therapeutic activities;Therapeutic exercise;Neuromuscular re-education;Patient/family education;Manual  techniques;Dry needling;Vasopneumatic Device;Joint Manipulations    PT Next Visit Plan Progress scapular and gentle RTC strength.  Improve capsular flexibility with conservative progression of ER AROM due to huge labral tear 7-12 o'clock.    PT Home Exercise Plan Access Code: [email or web address]    Consulted and Agree with Plan of Care Patient           Patient will benefit from skilled therapeutic intervention in order to improve the following deficits and impairments:  Decreased endurance,Decreased range of motion,Decreased strength,Increased edema,Impaired UE functional use,Pain  Visit Diagnosis: Abnormal posture  Muscle weakness (generalized)  Stiffness of left shoulder, not elsewhere classified  Acute pain of left shoulder     Problem List Patient Active Problem List   Diagnosis Date Noted  . History of Abnormal LFTs (liver function tests) 12/30/2019  . Abnormal liver ultrasound 12/30/2019  . Fatty liver 12/30/2019  . Right flank pain 12/30/2019  . Abdominal wall pain 12/30/2019  . Gastroesophageal reflux disease without esophagitis 12/30/2019  . 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 Carpal tunnel syndrome 09/08/2019  . Bursitis of shoulder, right 08/31/2019  . Incisional hernia, without obstruction or gangrene 05/20/2018  . Opiate dependence ([facility]) 08/02/2013    08/04/2013 PT, MPT 03/16/2020, 4:18 PM  [facility] [address] [place], [place], [place] Phone: [phone]   Fax:  [phone]  Name: ARIC Jimmy [name] MRN: Dionicio Stall Date of Birth: Oct 26, 1990

## 2020-03-16 NOTE — Patient Instructions (Signed)
Access Code: [email or web address] URL: [email or web address] Date: 03/16/2020 Prepared by: Pauletta Browns  Exercises Pendulums - 3-5 x daily - 7 x weekly - 1 sets - 20-30 reps Supine Scapular Protraction in Flexion with Dumbbells - 2-3 x daily - 7 x weekly - 1 sets - 20 reps - 3 seconds hold Standing Scapular Retraction - 5 x daily - 7 x weekly - 1 sets - 5 reps - 5 second hold

## 2020-03-19 ENCOUNTER — Encounter: Payer: Self-pay | Admitting: Orthopaedic Surgery

## 2020-03-20 ENCOUNTER — Telehealth: Payer: Self-pay | Admitting: Orthopaedic Surgery

## 2020-03-20 ENCOUNTER — Other Ambulatory Visit: Payer: Self-pay | Admitting: Physician Assistant

## 2020-03-20 MED ORDER — METHOCARBAMOL 500 MG PO TABS: 500.0000 mg | ORAL_TABLET | Freq: Two times a day (BID) | ORAL | 0 refills | Status: DC | PRN

## 2020-03-20 MED ORDER — OXYCODONE-ACETAMINOPHEN 5-325 MG PO TABS: 1.0000 | ORAL_TABLET | Freq: Two times a day (BID) | ORAL | 0 refills | Status: DC | PRN

## 2020-03-20 NOTE — Telephone Encounter (Signed)
Pls advise. Thanks.  

## 2020-03-20 NOTE — Telephone Encounter (Signed)
Note generated and put up front for pick up

## 2020-03-20 NOTE — Telephone Encounter (Signed)
Ok to do.  No lifting greater than 10lbs operative extremity for 6 weeks after surgery

## 2020-03-20 NOTE — Telephone Encounter (Signed)
Sent in

## 2020-03-20 NOTE — Telephone Encounter (Signed)
Patient's girlfriend Efraim Kaufmann called requesting a work restriction doctor's note on behalf of patient. Patient is asking for a phone call when letter is ready for pick up. Patient phone number is [phone].

## 2020-03-22 ENCOUNTER — Ambulatory Visit: Payer: No Typology Code available for payment source | Admitting: Physician Assistant

## 2020-03-22 ENCOUNTER — Encounter: Payer: Self-pay | Admitting: Physician Assistant

## 2020-03-22 ENCOUNTER — Telehealth: Payer: Self-pay | Admitting: Orthopaedic Surgery

## 2020-03-22 ENCOUNTER — Other Ambulatory Visit: Payer: Self-pay

## 2020-03-22 VITALS — Wt 176.0 lb

## 2020-03-22 DIAGNOSIS — L700 Acne vulgaris: Secondary | ICD-10-CM

## 2020-03-22 DIAGNOSIS — Z5181 Encounter for therapeutic drug level monitoring: Secondary | ICD-10-CM | POA: Diagnosis not present

## 2020-03-22 LAB — CBC WITH DIFFERENTIAL/PLATELET
Absolute Monocytes: 728 cells/uL (ref 200–950)
Basophils Absolute: 46 cells/uL (ref 0–200)
Basophils Relative: 0.5 %
Eosinophils Absolute: 228 cells/uL (ref 15–500)
Eosinophils Relative: 2.5 %
HCT: 42.1 % (ref 38.5–50.0)
Hemoglobin: 14.5 g/dL (ref 13.2–17.1)
Lymphs Abs: 3704 cells/uL (ref 850–3900)
MCH: 29.9 pg (ref 27.0–33.0)
MCHC: 34.4 g/dL (ref 32.0–36.0)
MCV: 86.8 fL (ref 80.0–100.0)
MPV: 10 fL (ref 7.5–12.5)
Monocytes Relative: 8 %
Neutro Abs: 4395 cells/uL (ref 1500–7800)
Neutrophils Relative %: 48.3 %
Platelets: 298 10*3/uL (ref 140–400)
RBC: 4.85 10*6/uL (ref 4.20–5.80)
RDW: 12.2 % (ref 11.0–15.0)
Total Lymphocyte: 40.7 %
WBC: 9.1 10*3/uL (ref 3.8–10.8)

## 2020-03-22 LAB — COMPREHENSIVE METABOLIC PANEL
AG Ratio: 2 (calc) (ref 1.0–2.5)
ALT: 27 U/L (ref 9–46)
AST: 16 U/L (ref 10–40)
Albumin: 4.7 g/dL (ref 3.6–5.1)
Alkaline phosphatase (APISO): 75 U/L (ref 36–130)
BUN: 13 mg/dL (ref 7–25)
CO2: 25 mmol/L (ref 20–32)
Calcium: 9.4 mg/dL (ref 8.6–10.3)
Chloride: 105 mmol/L (ref 98–110)
Creat: 0.81 mg/dL (ref 0.60–1.35)
Globulin: 2.3 g/dL (calc) (ref 1.9–3.7)
Glucose, Bld: 86 mg/dL (ref 65–99)
Potassium: 4.4 mmol/L (ref 3.5–5.3)
Sodium: 137 mmol/L (ref 135–146)
Total Bilirubin: 0.3 mg/dL (ref 0.2–1.2)
Total Protein: 7 g/dL (ref 6.1–8.1)

## 2020-03-22 LAB — LIPID PANEL
Cholesterol: 184 mg/dL (ref <200)
HDL: 43 mg/dL (ref >=40)
LDL Cholesterol (Calc): 124 mg/dL (calc) — ABNORMAL HIGH
Non-HDL Cholesterol (Calc): 141 mg/dL (calc) — ABNORMAL HIGH (ref <130)
Total CHOL/HDL Ratio: 4.3 (calc) (ref <5.0)
Triglycerides: 74 mg/dL (ref <150)

## 2020-03-22 NOTE — Telephone Encounter (Signed)
Patient's girlfriend called requesting updated doctor's note be faxed to patient job at [phone].

## 2020-03-22 NOTE — Telephone Encounter (Signed)
Faxed note from 01/03

## 2020-03-23 ENCOUNTER — Encounter: Payer: Self-pay | Admitting: Physician Assistant

## 2020-03-23 MED ORDER — ISOTRETINOIN 20 MG PO CAPS: 20.0000 mg | ORAL_CAPSULE | Freq: Every day | ORAL | 0 refills | Status: DC

## 2020-03-27 ENCOUNTER — Telehealth: Payer: Self-pay | Admitting: *Deleted

## 2020-03-27 NOTE — Telephone Encounter (Signed)
May be best to come in and be evaluated

## 2020-03-27 NOTE — Telephone Encounter (Signed)
Prior Authrization done via cover my meds for myorisan approved.

## 2020-03-28 ENCOUNTER — Ambulatory Visit (INDEPENDENT_AMBULATORY_CARE_PROVIDER_SITE_OTHER): Payer: No Typology Code available for payment source | Admitting: Physician Assistant

## 2020-03-28 ENCOUNTER — Encounter: Payer: Self-pay | Admitting: Orthopaedic Surgery

## 2020-03-28 VITALS — Ht 64.0 in | Wt 176.0 lb

## 2020-03-28 DIAGNOSIS — G8929 Other chronic pain: Secondary | ICD-10-CM

## 2020-03-28 DIAGNOSIS — M25512 Pain in left shoulder: Secondary | ICD-10-CM

## 2020-03-28 MED ORDER — DICLOFENAC SODIUM 75 MG PO TBEC: 75.0000 mg | DELAYED_RELEASE_TABLET | Freq: Two times a day (BID) | ORAL | 0 refills | Status: DC

## 2020-03-28 MED ORDER — ONDANSETRON HCL 4 MG PO TABS: 4.0000 mg | ORAL_TABLET | Freq: Three times a day (TID) | ORAL | 0 refills | Status: DC | PRN

## 2020-03-28 MED ORDER — OXYCODONE-ACETAMINOPHEN 5-325 MG PO TABS: 1.0000 | ORAL_TABLET | Freq: Two times a day (BID) | ORAL | 0 refills | Status: DC | PRN

## 2020-03-28 MED ORDER — METHOCARBAMOL 500 MG PO TABS: 500.0000 mg | ORAL_TABLET | Freq: Two times a day (BID) | ORAL | 0 refills | Status: DC | PRN

## 2020-03-28 NOTE — Progress Notes (Signed)
Post-Op Visit Note   Patient: [name]           Date of Birth: Jul 27, 1990           MRN: [id number] Visit Date: 03/28/2020 PCP: Jimmy Rakes, MD   Assessment & Plan:  Chief Complaint:  Chief Complaint  Patient presents with  . Left Shoulder - Pain    Left shoulder arthroscopy 03/01/2020   Visit Diagnoses:  1. Chronic left shoulder pain     Plan: Patient is a pleasant 30 year old gentleman who comes in today 4 weeks out left biceps tenodesis.  He was doing well until about 4 days ago when he noticed increased pain to the anterior shoulder and into the biceps region.  He denies any injury or change in activity.  He does note that he sleeps with his arms above his head and is wondering if this has contributed to his symptoms.  He has been in physical therapy but denies any change in exercises.  The pain he has is primarily when he is pulling up his pants or lying on his side.  He has been taking Robaxin and oxycodone with relief of symptoms.  No fevers or chills.  Examination of his left shoulder reveals a fully healed surgical scar without complication.  No evidence of infection or cellulitis.  He has diffuse tenderness to the anterior shoulder and into the biceps region.  He does not exhibit a Popeye deformity.  He is neurovascular intact distally.  At this point, he will use ice and heat as needed.  Continue with physical therapy.  I have refilled his medication and started him on an anti-inflammatory.  He will follow-up with [place] in 2 weeks time for repeat evaluation.  Call with concerns or questions meantime.  Follow-Up Instructions: Return in about 2 weeks (around 04/11/2020).   Orders:  No orders of the defined types were placed in this encounter.  Meds ordered this encounter  Medications  . diclofenac (VOLTAREN) 75 MG EC tablet    Sig: Take 1 tablet (75 mg total) by mouth 2 (two) times daily.    Dispense:  60 tablet    Refill:  0  . methocarbamol (ROBAXIN) 500 MG  tablet    Sig: Take 1 tablet (500 mg total) by mouth 2 (two) times daily as needed.    Dispense:  20 tablet    Refill:  0  . oxyCODONE-acetaminophen (PERCOCET) 5-325 MG tablet    Sig: Take 1-2 tablets by mouth 2 (two) times daily as needed.    Dispense:  30 tablet    Refill:  0  . ondansetron (ZOFRAN) 4 MG tablet    Sig: Take 1 tablet (4 mg total) by mouth every 8 (eight) hours as needed for nausea or vomiting.    Dispense:  40 tablet    Refill:  0    Imaging: No new imaging  PMFS History: Patient Active Problem List   Diagnosis Date Noted  . History of Abnormal LFTs (liver function tests) 12/30/2019  . Abnormal liver ultrasound 12/30/2019  . Fatty liver 12/30/2019  . Right flank pain 12/30/2019  . Abdominal wall pain 12/30/2019  . Gastroesophageal reflux disease without esophagitis 12/30/2019  . 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 Carpal tunnel syndrome 09/08/2019  . Bursitis of shoulder, right 08/31/2019  . Incisional hernia, without obstruction or gangrene 05/20/2018  . Opiate dependence ([place]) 08/02/2013   Past Medical History:  Diagnosis Date  . ADHD (  attention deficit hyperactivity disorder)   . Anxiety   . Arthritis   . Cancer ([place])    skin cancer  . Chest pain 08/2018  . GERD (gastroesophageal reflux disease)    no meds  . Migraine    migraines due to pinched nerve  . Pinched nerve in neck   . Scoliosis     Family History  Problem Relation Age of Onset  . Diabetes Mother   . Kidney cancer Mother   . Diabetes Maternal Grandmother   . Cancer Maternal Grandmother        unknown type. doing chemo  . Cancer Maternal Grandfather   . Diabetes Maternal Aunt   . Stomach cancer Neg Hx   . Pancreatic cancer Neg Hx   . Esophageal cancer Neg Hx   . Liver disease Neg Hx   . Colon cancer Neg Hx   . Rectal cancer Neg Hx     Past Surgical History:  Procedure Laterality Date  . ESOPHAGUS SURGERY     as a baby  .  SHOULDER ARTHROSCOPY WITH BICEPS TENDON REPAIR Left 03/01/2020   Procedure: LEFT SHOULDER ARTHROSCOPY WITH BICEPS TENODESIS;  Surgeon: Leandrew Koyanagi, MD;  Location: [facility];  Service: Orthopedics;  Laterality: Left;  . SHOULDER ARTHROSCOPY WITH SUBACROMIAL DECOMPRESSION Right 09/01/2019   Procedure: RIGHT SHOULDER ARTHROSCOPY WITH EXTENSIVE DEBRIDEMENT AND SUBACROMIAL DECOMPRESSION;  Surgeon: Leandrew Koyanagi, MD;  Location: [facility];  Service: Orthopedics;  Laterality: Right;  . VENTRAL HERNIA REPAIR N/A 08/31/2018   Procedure: HERNIA REPAIR VENTRAL ADULT;  Surgeon: Olean Ree, MD;  Location: [facility];  Service: General;  Laterality: N/A;   Social History   Occupational History  . Not on file  Tobacco Use  . Smoking status: Current Every Day Smoker    Packs/day: 1.00    Years: 15.00    Pack years: 15.00    Types: Cigarettes  . Smokeless tobacco: Former Network engineer  . Vaping Use: Former  Substance and Sexual Activity  . Alcohol use: Not Currently  . Drug use: Not Currently  . Sexual activity: Yes

## 2020-03-29 ENCOUNTER — Other Ambulatory Visit: Payer: Self-pay

## 2020-03-29 ENCOUNTER — Encounter: Payer: Self-pay | Admitting: Neurology

## 2020-03-29 ENCOUNTER — Ambulatory Visit (INDEPENDENT_AMBULATORY_CARE_PROVIDER_SITE_OTHER): Payer: No Typology Code available for payment source | Admitting: Physical Therapy

## 2020-03-29 ENCOUNTER — Encounter: Payer: Self-pay | Admitting: Physical Therapy

## 2020-03-29 DIAGNOSIS — R293 Abnormal posture: Secondary | ICD-10-CM | POA: Diagnosis not present

## 2020-03-29 DIAGNOSIS — M25511 Pain in right shoulder: Secondary | ICD-10-CM

## 2020-03-29 DIAGNOSIS — M25612 Stiffness of left shoulder, not elsewhere classified: Secondary | ICD-10-CM | POA: Diagnosis not present

## 2020-03-29 DIAGNOSIS — M6281 Muscle weakness (generalized): Secondary | ICD-10-CM | POA: Diagnosis not present

## 2020-03-29 DIAGNOSIS — M25512 Pain in left shoulder: Secondary | ICD-10-CM | POA: Diagnosis not present

## 2020-03-29 NOTE — Progress Notes (Signed)
Margarito Courser ([employer]Almeta Monas) Rx #: [id number] Aimovig 70MG /ML auto-injectors   Form [employer] Electronic PA Form [phone] NCPDP) Created 19 hours ago Sent to Plan 18 hours ago Plan Response 18 hours ago Submit Clinical Questions 18 hours ago Determination Favorable 2 hours ago Message from [employer] Your PA request has been approved. Additional information will be provided in the approval communication. (Message 1145) Approval valid from 03/28/20 to 03/28/21.

## 2020-03-29 NOTE — Therapy (Signed)
[facility] [address] [place], [name], [address] Phone: [phone]   Fax:  [phone]  Physical Therapy Treatment  Patient Details  Name: Jimmy [name] MRN: [id number] Date of Birth: [birth year] Referring Provider (PT): Gregor Hams MD   Encounter Date: 03/29/2020   PT End of Session - 03/29/20 0921    Visit Number 2    Number of Visits 20    PT Start Time 0850    PT Stop Time 0930    PT Time Calculation (min) 40 min    Activity Tolerance Patient tolerated treatment well;No increased pain    Behavior During Therapy WFL for tasks assessed/performed           Past Medical History:  Diagnosis Date  . ADHD (attention deficit hyperactivity disorder)   . Anxiety   . Arthritis   . Cancer ([place])    skin cancer  . Chest pain 08/2018  . GERD (gastroesophageal reflux disease)    no meds  . Migraine    migraines due to pinched nerve  . Pinched nerve in neck   . Scoliosis     Past Surgical History:  Procedure Laterality Date  . ESOPHAGUS SURGERY     as a baby  . SHOULDER ARTHROSCOPY WITH BICEPS TENDON REPAIR Left 03/01/2020   Procedure: LEFT SHOULDER ARTHROSCOPY WITH BICEPS TENODESIS;  Surgeon: Leandrew Koyanagi, MD;  Location: [facility];  Service: Orthopedics;  Laterality: Left;  . SHOULDER ARTHROSCOPY WITH SUBACROMIAL DECOMPRESSION Right 09/01/2019   Procedure: RIGHT SHOULDER ARTHROSCOPY WITH EXTENSIVE DEBRIDEMENT AND SUBACROMIAL DECOMPRESSION;  Surgeon: Leandrew Koyanagi, MD;  Location: [facility] [place] [facility];  Service: Orthopedics;  Laterality: Right;  . VENTRAL HERNIA REPAIR N/A 08/31/2018   Procedure: HERNIA REPAIR VENTRAL ADULT;  Surgeon: Olean Ree, MD;  Location: [facility];  Service: General;  Laterality: N/A;    There were no vitals filed for this visit.   Subjective Assessment - 03/29/20 0912    Subjective Pt arriving today reporting 8/10 pain in left shoulder. Pt stated he went to see Dr. Erlinda Hong yesterday and he  is to follow up in 2 weeks. Pt taking anti-inflammatory meds, and pain meds.    Pertinent History L shoulder arthroscopy to address 7-12 o'clock labral tear and biceps tenodesis on 03/01/2020.    Patient Stated Goals Get back to normal and return to work full-duty.    Currently in Pain? Yes    Pain Score 8     Pain Location Shoulder    Pain Orientation Left    Pain Descriptors / Indicators Tightness;Burning;Sore    Pain Type Surgical pain    Pain Onset 1 to 4 weeks ago                             [facility] Adult PT Treatment/Exercise - 03/29/20 0001      Exercises   Exercises Shoulder      Shoulder Exercises: Supine   Protraction Strengthening;Both;20 reps;Other (comment)    Protraction Weight (lbs) no weight deferred due in increased pain levels      Shoulder Exercises: Seated   Other Seated Exercises table slides x 15 reps in flexion      Shoulder Exercises: Standing   Retraction Strengthening;Both;10 reps    Retraction Limitations 5 seconds shoulder blade pinches      Shoulder Exercises: Pulleys   Flexion 2 minutes    Scaption 2 minutes  Modalities   Modalities Vasopneumatic      Vasopneumatic   Number Minutes Vasopneumatic  10 minutes    Vasopnuematic Location  Shoulder    Vasopneumatic Pressure Low    Vasopneumatic Temperature  34      Manual Therapy   Manual Therapy Soft tissue mobilization;Passive ROM    Manual therapy comments G2-3 AP mobs left shoulder, PROM all planes, trigger point release in left upper trap and rhomboid   15 minutes                      PT Long Term Goals - 03/29/20 0928      PT LONG TERM GOAL #1   Title Improve FOTO to 66.    Status On-going      PT LONG TERM GOAL #2   Title Improve L shoulder AROM to 100% of the uninvolved R.    Status On-going      PT LONG TERM GOAL #3   Title Improve RTC strength to 5/5 MMT for ER and IR.    Status On-going      PT LONG TERM GOAL #4   Title Jimmy [name] will  report L shoulder pain consistently 0-3/10 on the Numeric Pain Rating Scale.    Status On-going      PT LONG TERM GOAL #5   Title Jimmy [name] will be independent with his long-term HEP at [place].    Status On-going                 Plan - 03/29/20 0925    Clinical Impression Statement Jimmy [name] arriving today reporting 8/10 pain in left shoulder. Pt to follow up with Dr. Erlinda Hong in 2 weeks after his visit yesterday. Pt encouraged to not over do it at work and be cautious to avoid reinjury. Pt mild more gentle AAROM, and PROM performed. Continue with skilled PT.    Personal Factors and Comorbidities Comorbidity 1    Examination-Activity Limitations Bathing;Dressing;Sleep;Hygiene/Grooming;Bed Mobility;Carry;Reach Overhead    Examination-Participation Restrictions Occupation;Community Activity    Stability/Clinical Decision Making Stable/Uncomplicated    Rehab Potential Good    PT Duration Other (comment)    PT Treatment/Interventions ADLs/Self Care Home Management;Iontophoresis 4mg /ml Dexamethasone;Cryotherapy;Therapeutic activities;Therapeutic exercise;Neuromuscular re-education;Patient/family education;Manual techniques;Dry needling;Vasopneumatic Device;Joint Manipulations    PT Next Visit Plan Progress scapular and gentle RTC strength.  Improve capsular flexibility with conservative progression of ER AROM due to huge labral tear 7-12 o'clock.    PT Home Exercise Plan Access Code: [email or web address]    Consulted and Agree with Plan of Care Patient           Patient will benefit from skilled therapeutic intervention in order to improve the following deficits and impairments:  Decreased endurance,Decreased range of motion,Decreased strength,Increased edema,Impaired UE functional use,Pain  Visit Diagnosis: Abnormal posture  Muscle weakness (generalized)  Stiffness of left shoulder, not elsewhere classified  Acute pain of left shoulder  Right shoulder pain, unspecified chronicity  Left shoulder pain,  unspecified chronicity     Problem List Patient Active Problem List   Diagnosis Date Noted  . History of Abnormal LFTs (liver function tests) 12/30/2019  . Abnormal liver ultrasound 12/30/2019  . Fatty liver 12/30/2019  . Right flank pain 12/30/2019  . Abdominal wall pain 12/30/2019  . Gastroesophageal reflux disease without esophagitis 12/30/2019  . 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 Carpal tunnel syndrome 09/08/2019  . Bursitis of shoulder, right 08/31/2019  . Incisional hernia, without obstruction or gangrene 05/20/2018  .  Opiate dependence ([place]) 08/02/2013    Oretha Caprice, PT , MPT 03/29/2020, 9:31 AM  [facility] [address] [place], [name], [address] Phone: [phone]   Fax:  [phone]  Name: Jimmy [name] MRN: [id number] Date of Birth: Aug 13, 1990

## 2020-03-30 ENCOUNTER — Encounter: Payer: No Typology Code available for payment source | Admitting: Physical Therapy

## 2020-03-31 ENCOUNTER — Encounter: Payer: Self-pay | Admitting: Physician Assistant

## 2020-03-31 NOTE — Progress Notes (Signed)
   New Patient   Subjective  Jimmy [name] is a 30 y.o. male who presents for the following: New Patient (Initial Visit) (Patient here today for cyst like bumps on his torso, top of buttock crease, and now bumps on both forearms and both thighs. Per patient the bumps are painful and when they do pop open they stink really bad. Per patient he's tried OTC sensitive soaps and nothing has helped.  Per patient he was told by one of the Doctors he seen in the past informed him that he has some kind of bacterial skin infection. Per patient he's been on different antibiotics and non of those helped, he doesn't remember the names.).   The following portions of the chart were reviewed this encounter and updated as appropriate:      Objective  Well appearing patient in no apparent distress; mood and affect are within normal limits.  A full examination was performed including scalp, head, eyes, ears, nose, lips, neck, chest, axillae, abdomen, back, buttocks, bilateral upper extremities, bilateral lower extremities, hands, feet, fingers, toes, fingernails, and toenails. All findings within normal limits unless otherwise noted below.  Objective  Chest - Medial (Center), Head - Anterior (Face), Mid Back: Erythematous papules and pustules with comedones   Images         Assessment & Plan  Acne vulgaris (3) Head - Anterior (Face); Chest - Medial [facility]); Mid Back  Start Isotret- follow up in 31 days will start patient at 20mg   CBC with Differential/Platelet - Chest - Medial (Center), Head - Anterior (Face), Mid Back  Comprehensive metabolic panel - Chest - Medial (Center), Head - Anterior (Face), Mid Back  Lipid panel - Chest - Medial (Center), Head - Anterior (Face), Mid Back  ISOtretinoin (ACCUTANE) 20 MG capsule - Chest - Medial (Center), Head - Anterior (Face), Mid Back  Encounter for therapeutic drug monitoring  Other Related Procedures CBC with  Differential/Platelet Comprehensive metabolic panel Lipid panel     I, Mortimer Bair, PA-C, have reviewed all documentation for this visit. The documentation on 03/31/20 for the exam, diagnosis, procedures, and orders are all accurate and complete.Marland Kitchen

## 2020-04-03 ENCOUNTER — Encounter: Payer: No Typology Code available for payment source | Admitting: Physical Therapy

## 2020-04-05 ENCOUNTER — Ambulatory Visit (INDEPENDENT_AMBULATORY_CARE_PROVIDER_SITE_OTHER): Payer: No Typology Code available for payment source | Admitting: Physician Assistant

## 2020-04-05 ENCOUNTER — Other Ambulatory Visit: Payer: Self-pay

## 2020-04-05 ENCOUNTER — Encounter: Payer: Self-pay | Admitting: Orthopaedic Surgery

## 2020-04-05 ENCOUNTER — Ambulatory Visit
Admission: EM | Admit: 2020-04-05 | Discharge: 2020-04-05 | Disposition: A | Discharge status: Home / Self Care | Payer: No Typology Code available for payment source | Attending: Emergency Medicine | Admitting: Emergency Medicine

## 2020-04-05 ENCOUNTER — Other Ambulatory Visit: Payer: Self-pay | Admitting: Physician Assistant

## 2020-04-05 DIAGNOSIS — Z1152 Encounter for screening for COVID-19: Secondary | ICD-10-CM

## 2020-04-05 DIAGNOSIS — Z8616 Personal history of COVID-19: Secondary | ICD-10-CM

## 2020-04-05 DIAGNOSIS — Z9889 Other specified postprocedural states: Secondary | ICD-10-CM

## 2020-04-05 HISTORY — DX: Personal history of COVID-19: Z86.16

## 2020-04-05 MED ORDER — METHOCARBAMOL 500 MG PO TABS: 500.0000 mg | ORAL_TABLET | Freq: Two times a day (BID) | ORAL | 0 refills | Status: DC | PRN

## 2020-04-05 MED ORDER — OXYCODONE-ACETAMINOPHEN 5-325 MG PO TABS: 1.0000 | ORAL_TABLET | Freq: Two times a day (BID) | ORAL | 0 refills | Status: DC | PRN

## 2020-04-05 NOTE — Progress Notes (Signed)
Post-Op Visit Note   Patient: Jimmy [name]           Date of Birth: [birth year]           MRN: [id number] Visit Date: 04/05/2020 PCP: Charlott Rakes, MD   Assessment & Plan:  Chief Complaint:  Chief Complaint  Patient presents with  . Left Shoulder - Follow-up   Visit Diagnoses:  1. S/P arthroscopy of left shoulder     Plan: Patient is a pleasant 30 year old gentleman who comes in today 5 weeks out left shoulder arthroscopic biceps tenodesis.  He has had increased pain over the past 2 weeks without any specific injury or change in activity.  He notes that his pain started to improve but fell as he slipped on ice over the weekend.  Examination of his left shoulder reveals full active range of motion all planes.  He does have slight pain and limitation with resisted biceps curl.  No Popeye deformity.  He is neurovascular intact distally.  At this point, I do not think he disrupted the structural integrity of the biceps repair.  He will continue to advance activity as tolerated.  Still no lifting more than 10 pounds for another week.  He will continue with physical therapy.  Follow-up with [place] in 7 weeks time when he is 12 weeks out from surgery.  Call with concerns or questions in the meantime.  Follow-Up Instructions: Return in about 7 weeks (around 05/24/2020).   Orders:  No orders of the defined types were placed in this encounter.  No orders of the defined types were placed in this encounter.   Imaging: No new imaging  PMFS History: Patient Active Problem List   Diagnosis Date Noted  . History of Abnormal LFTs (liver function tests) 12/30/2019  . Abnormal liver ultrasound 12/30/2019  . Fatty liver 12/30/2019  . Right flank pain 12/30/2019  . Abdominal wall pain 12/30/2019  . Gastroesophageal reflux disease without esophagitis 12/30/2019  . 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 Carpal tunnel syndrome 09/08/2019  . Bursitis of  shoulder, right 08/31/2019  . Incisional hernia, without obstruction or gangrene 05/20/2018  . Opiate dependence ([place]) 08/02/2013   Past Medical History:  Diagnosis Date  . ADHD (attention deficit hyperactivity disorder)   . Anxiety   . Arthritis   . Cancer ([place])    skin cancer  . Chest pain 08/2018  . GERD (gastroesophageal reflux disease)    no meds  . Migraine    migraines due to pinched nerve  . Pinched nerve in neck   . Scoliosis     Family History  Problem Relation Age of Onset  . Diabetes Mother   . Kidney cancer Mother   . Diabetes Maternal Grandmother   . Cancer Maternal Grandmother        unknown type. doing chemo  . Cancer Maternal Grandfather   . Diabetes Maternal Aunt   . Stomach cancer Neg Hx   . Pancreatic cancer Neg Hx   . Esophageal cancer Neg Hx   . Liver disease Neg Hx   . Colon cancer Neg Hx   . Rectal cancer Neg Hx     Past Surgical History:  Procedure Laterality Date  . ESOPHAGUS SURGERY     as a baby  . SHOULDER ARTHROSCOPY WITH BICEPS TENDON REPAIR Left 03/01/2020   Procedure: LEFT SHOULDER ARTHROSCOPY WITH BICEPS TENODESIS;  Surgeon: Leandrew Koyanagi, MD;  Location: [place];  Service:  Orthopedics;  Laterality: Left;  . SHOULDER ARTHROSCOPY WITH SUBACROMIAL DECOMPRESSION Right 09/01/2019   Procedure: RIGHT SHOULDER ARTHROSCOPY WITH EXTENSIVE DEBRIDEMENT AND SUBACROMIAL DECOMPRESSION;  Surgeon: Leandrew Koyanagi, MD;  Location: [place];  Service: Orthopedics;  Laterality: Right;  . VENTRAL HERNIA REPAIR N/A 08/31/2018   Procedure: HERNIA REPAIR VENTRAL ADULT;  Surgeon: Olean Ree, MD;  Location: [facility];  Service: General;  Laterality: N/A;   Social History   Occupational History  . Not on file  Tobacco Use  . Smoking status: Current Every Day Smoker    Packs/day: 1.00    Years: 15.00    Pack years: 15.00    Types: Cigarettes  . Smokeless tobacco: Former Network engineer  . Vaping Use: Former  Substance  and Sexual Activity  . Alcohol use: Not Currently  . Drug use: Not Currently  . Sexual activity: Yes

## 2020-04-05 NOTE — ED Triage Notes (Signed)
Here for covid test only 

## 2020-04-06 ENCOUNTER — Encounter: Payer: Self-pay | Admitting: Family Medicine

## 2020-04-06 ENCOUNTER — Encounter: Payer: No Typology Code available for payment source | Admitting: Rehabilitative and Restorative Service Providers"

## 2020-04-07 ENCOUNTER — Telehealth: Payer: Self-pay

## 2020-04-07 ENCOUNTER — Ambulatory Visit: Payer: Self-pay | Admitting: *Deleted

## 2020-04-07 LAB — SARS-COV-2, NAA 2 DAY TAT

## 2020-04-07 LAB — NOVEL CORONAVIRUS, NAA: SARS-CoV-2, NAA: DETECTED — AB

## 2020-04-07 NOTE — Telephone Encounter (Signed)
Phone call from patient wanting to know if Isotretinoin can cause an increase in blood pressure.  Per patient as he was leaving the Pharmacy yesterday picking up medication for his children he noticed that his hands had turned a blueish-greenish color and he checked his blood pressure.  Per patient when he checked his blood pressure it was 185/116 per patient he waited 30 minutes and rechecked his blood pressure and at that time it was 205/100something.  Patient states the then called the EMS and when they arrived they rechecked his blood pressure and it was 151/100something.  Patient states that EMS informed him that he should go to the ER to be checked out for this, patient states that once he arrived at the ER they checked his blood pressure and it was 131/116.  I asked patient if he had started any other medictions besides the Isotretinoin and I also asked patient if he was having any other symptoms besides the color change of his hands?  Patient denies starting any other medications besides the Isotretinoin and patient denies any other symptoms.  Patient wants to know what should he do?  I informed patient I would speak with Robyne Askew, PA-C and get back with him.

## 2020-04-07 NOTE — Telephone Encounter (Signed)
Phone call to patient with Robyne Askew, PA-C's recommendations. Patient aware, he will call us next week.

## 2020-04-07 NOTE — Telephone Encounter (Signed)
Discontinue isotret for 7 days. Take BP am and pm daily. He may have to obtain a BP monitor.Call us next week. If BP does not decrease he may resume isotret and call his PCP.

## 2020-04-07 NOTE — Telephone Encounter (Signed)
I returned his call.   His test result came back positive from urgent care [facility].   He wanted to know how long he was supposed to quarantine.   "The news said 5 days".   I went over the 5 day quarantine protocol and to wear a mask the next 5 days per the [facility] guidelines.   He said,  "Ok".   "That's what I wanted to know".   "Thank you".  Reason for Disposition  Health Information question, no triage required and triager able to answer question  Protocols used: [place]

## 2020-04-10 ENCOUNTER — Encounter: Payer: No Typology Code available for payment source | Admitting: Physical Therapy

## 2020-04-10 ENCOUNTER — Telehealth: Payer: Self-pay | Admitting: Physician Assistant

## 2020-04-10 NOTE — Telephone Encounter (Signed)
Called to discuss with patient about COVID-19 symptoms and the use of one of the available treatments for those with mild to moderate Covid symptoms and at a high risk of hospitalization.  Pt appears to qualify for outpatient treatment due to co-morbid conditions and/or a member of an at-risk group in accordance with the FDA Emergency Use Authorization.    Symptom onset: unknown  Vaccinated: unknown  Booster? unknown  Immunocompromised? no Qualifiers: high SVI, HTN  Unable to reach pt - left Vm and mychart  Angelena Form

## 2020-04-10 NOTE — Telephone Encounter (Signed)
Called to discuss with [name] about Covid symptoms and the use of sotrovimab, remdisivir or oral therapies for those with mild to moderate Covid symptoms and at a high risk of hospitalization.     Pt is qualified, but due to medication shortages we cannot offer the pt the infusion at this time. This decision was based on clinical judgement and the NIH Covid 19 treatment guidelines for treatment prioritization and equitable access. Symptoms tier reviewed as well as criteria for ending isolation. Preventative practices reviewed. Patient verbalized understanding   Patient Active Problem List   Diagnosis Date Noted  . History of Abnormal LFTs (liver function tests) 12/30/2019  . Abnormal liver ultrasound 12/30/2019  . Fatty liver 12/30/2019  . Right flank pain 12/30/2019  . Abdominal wall pain 12/30/2019  . Gastroesophageal reflux disease without esophagitis 12/30/2019  . 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 Carpal tunnel syndrome 09/08/2019  . Bursitis of shoulder, right 08/31/2019  . Incisional hernia, without obstruction or gangrene 05/20/2018  . Opiate dependence ([place]) 08/02/2013    Angelena Form PA-C

## 2020-04-11 ENCOUNTER — Ambulatory Visit: Payer: No Typology Code available for payment source | Admitting: Orthopaedic Surgery

## 2020-04-13 ENCOUNTER — Telehealth: Payer: Self-pay | Admitting: Orthopaedic Surgery

## 2020-04-13 ENCOUNTER — Other Ambulatory Visit: Payer: Self-pay | Admitting: Physician Assistant

## 2020-04-13 ENCOUNTER — Encounter: Payer: Self-pay | Admitting: Physician Assistant

## 2020-04-13 ENCOUNTER — Telehealth: Payer: Self-pay | Admitting: Physician Assistant

## 2020-04-13 ENCOUNTER — Encounter: Payer: Self-pay | Admitting: Internal Medicine

## 2020-04-13 ENCOUNTER — Ambulatory Visit: Payer: No Typology Code available for payment source | Attending: Family Medicine | Admitting: Internal Medicine

## 2020-04-13 ENCOUNTER — Other Ambulatory Visit: Payer: Self-pay

## 2020-04-13 VITALS — BP 132/76 | HR 101 | Temp 98.6°F

## 2020-04-13 DIAGNOSIS — U071 COVID-19: Secondary | ICD-10-CM | POA: Diagnosis not present

## 2020-04-13 MED ORDER — HYDROCODONE-ACETAMINOPHEN 5-325 MG PO TABS: 1.0000 | ORAL_TABLET | Freq: Two times a day (BID) | ORAL | 0 refills | Status: DC | PRN

## 2020-04-13 MED ORDER — METHOCARBAMOL 500 MG PO TABS: 500.0000 mg | ORAL_TABLET | Freq: Two times a day (BID) | ORAL | 0 refills | Status: DC | PRN

## 2020-04-13 NOTE — Patient Instructions (Signed)
Check you bp 3 times weekly.  Call if you have concerns

## 2020-04-13 NOTE — Telephone Encounter (Signed)
Phone call to patient to inform him that he can take a picture for Robyne Askew to see at his appointment.  Patient aware.

## 2020-04-13 NOTE — Progress Notes (Signed)
Post ED f/u and htn.   covid test on 1/19- notified positive on 1/21. He really had minor sxs and has quarantined for 5 days. He is here today and feels fine. ( I am wearing n95/goggles and appropriate aPPE).   htn- he tells me that he called EMS because he was monitoring bp at home. bp was 220/11. He also tells me that his only associated sxs was that he perceived that his left and then right forearm became discolored (green/blue)  He has been monitoring bps at home with a wrist cuff and bps are in the 100-150/60-90 range. He feels well and has no sxs.   Still smokes approximately 1.25ppd No alcohol No drugs.   When he had covid he did not take any otc meds.   Past Medical History:  Diagnosis Date  . ADHD (attention deficit hyperactivity disorder)   . Anxiety   . Arthritis   . Cancer ([place])    skin cancer  . Chest pain 08/2018  . GERD (gastroesophageal reflux disease)    no meds  . Migraine    migraines due to pinched nerve  . Pinched nerve in neck   . Scoliosis     Social History   Socioeconomic History  . Marital status: Married    Spouse name: Not on file  . Number of children: Not on file  . Years of education: Not on file  . Highest education level: Not on file  Occupational History  . Not on file  Tobacco Use  . Smoking status: Current Every Day Smoker    Packs/day: 1.00    Years: 15.00    Pack years: 15.00    Types: Cigarettes  . Smokeless tobacco: Former Network engineer  . Vaping Use: Former  Substance and Sexual Activity  . Alcohol use: Not Currently  . Drug use: Not Currently  . Sexual activity: Yes  Other Topics Concern  . Not on file  Social History Narrative   Right handed   One story home    Drinks caffeine    Social Determinants of Health   Financial Resource Strain: Not on file  Food Insecurity: Not on file  Transportation Needs: Not on file  Physical Activity: Not on file  Stress: Not on file  Social Connections: Not on file   Intimate Partner Violence: Not on file    Past Surgical History:  Procedure Laterality Date  . ESOPHAGUS SURGERY     as a baby  . SHOULDER ARTHROSCOPY WITH BICEPS TENDON REPAIR Left 03/01/2020   Procedure: LEFT SHOULDER ARTHROSCOPY WITH BICEPS TENODESIS;  Surgeon: Leandrew Koyanagi, MD;  Location: [place];  Service: Orthopedics;  Laterality: Left;  . SHOULDER ARTHROSCOPY WITH SUBACROMIAL DECOMPRESSION Right 09/01/2019   Procedure: RIGHT SHOULDER ARTHROSCOPY WITH EXTENSIVE DEBRIDEMENT AND SUBACROMIAL DECOMPRESSION;  Surgeon: Leandrew Koyanagi, MD;  Location: [place];  Service: Orthopedics;  Laterality: Right;  . VENTRAL HERNIA REPAIR N/A 08/31/2018   Procedure: HERNIA REPAIR VENTRAL ADULT;  Surgeon: Olean Ree, MD;  Location: [facility];  Service: General;  Laterality: N/A;    Family History  Problem Relation Age of Onset  . Diabetes Mother   . Kidney cancer Mother   . Diabetes Maternal Grandmother   . Cancer Maternal Grandmother        unknown type. doing chemo  . Cancer Maternal Grandfather   . Diabetes Maternal Aunt   . Stomach cancer Neg Hx   . Pancreatic cancer Neg Hx   .  Esophageal cancer Neg Hx   . Liver disease Neg Hx   . Colon cancer Neg Hx   . Rectal cancer Neg Hx     Allergies  Allergen Reactions  . Cyclobenzaprine Hives and Nausea Only  . Depakote [Divalproex Sodium] Other (See Comments)    "made him crazy"  . Ritalin [Methylphenidate Hcl] Other (See Comments)    Increased hyper activeness   . Naproxen Nausea Only    Acid reflux  . Other Other (See Comments)    [facility]-pt states skin became reddened and he tasted metal (08-2018)  . Tramadol Other (See Comments)    Acid reflux    Current Outpatient Medications on File Prior to Visit  Medication Sig Dispense Refill  . albuterol (VENTOLIN HFA) 108 (90 Base) MCG/ACT inhaler Inhale 1-2 puffs into the lungs every 6 (six) hours as needed for wheezing or shortness of breath. 18 g 0  .  Erenumab-aooe (AIMOVIG) 70 MG/ML SOAJ Inject 70 mg into the skin every 28 (twenty-eight) days. 1 pen 11  . ISOtretinoin (ACCUTANE) 20 MG capsule Take 1 capsule (20 mg total) by mouth daily. 30 capsule 0  . meloxicam (MOBIC) 7.5 MG tablet Take 7.5 mg by mouth 2 (two) times daily.    Marland Kitchen omeprazole (PRILOSEC) 40 MG capsule Take 40 mg by mouth daily.    . ondansetron (ZOFRAN) 4 MG tablet Take 1 tablet (4 mg total) by mouth every 8 (eight) hours as needed for nausea or vomiting. 40 tablet 0  . QUEtiapine (SEROQUEL) 100 MG tablet Take 1 tablet (100 mg total) by mouth at bedtime. 30 tablet 6  . Ubrogepant (UBRELVY) 100 MG TABS Take 1 tablet by mouth as needed (May repeat 1 tablet in 2 hours.  Maximum 2 tablets in 24 hours.). 10 tablet 5  . [DISCONTINUED] famotidine (PEPCID) 40 MG tablet Take 1 tablet (40 mg total) by mouth daily. 90 tablet 1   No current facility-administered medications on file prior to visit.     patient denies chest pain, shortness of breath, orthopnea. Denies lower extremity edema, abdominal pain, change in appetite, change in bowel movements. Patient denies rashes, musculoskeletal complaints. No other specific complaints in a complete review of systems.   BP (!) 163/92   Pulse (!) 101   Temp 98.6 F (37 C)   SpO2 96%   well-developed well-nourished male in no acute distress. HEENT exam atraumatic, normocephalic, neck supple without jugular venous distention. Chest clear to auscultation cardiac exam S1-S2 are regular. Abdominal exam overweight with bowel sounds, soft and nontender. Extremities no edema. Neurologic exam is alert with a normal gait.   Elevated bp- we need more data. BP when I check it is fine and his wrist monitor correlates with systolic number but diastolic on wrist device is 95.  He will purchase an arm bp cuff  Check 3 times weekl at different times of day.  F/u one month.

## 2020-04-13 NOTE — Telephone Encounter (Signed)
Patient called. He would like a refill on oxycodone and Robaxin. His call back number is [phone]

## 2020-04-13 NOTE — Telephone Encounter (Signed)
Pt called and informed and stated understanding to being weaned down on medications

## 2020-04-13 NOTE — Telephone Encounter (Signed)
Patient calling because the bump at the top of buttock crease has gotten much bigger. He states that Vida Roller was going to biopsy but insurance would only pay 50% so she gave him medication to use. Now the bump is the size of a quarter. He asked if he should take a picture of the bump to show her at his accutane appointment on 2/4.

## 2020-04-13 NOTE — Telephone Encounter (Signed)
Sent in. Need to wean to norco.  Will need to wean off narcotics and muscle relaxers altogether soon

## 2020-04-14 ENCOUNTER — Encounter: Payer: No Typology Code available for payment source | Admitting: Rehabilitative and Restorative Service Providers"

## 2020-04-14 NOTE — Telephone Encounter (Signed)
Patient contacted the office yesterday regarding the bump/boil on his buttock, please review the message from yesterday.  Patient sent a message via mychart last night regarding the bump/boil please review the message and give patient the next step.

## 2020-04-17 ENCOUNTER — Encounter: Payer: Self-pay | Admitting: Physical Therapy

## 2020-04-17 ENCOUNTER — Other Ambulatory Visit: Payer: Self-pay

## 2020-04-17 ENCOUNTER — Telehealth: Payer: Self-pay

## 2020-04-17 ENCOUNTER — Ambulatory Visit (INDEPENDENT_AMBULATORY_CARE_PROVIDER_SITE_OTHER): Payer: No Typology Code available for payment source | Admitting: Physical Therapy

## 2020-04-17 ENCOUNTER — Encounter: Payer: Self-pay | Admitting: Orthopaedic Surgery

## 2020-04-17 DIAGNOSIS — M25612 Stiffness of left shoulder, not elsewhere classified: Secondary | ICD-10-CM

## 2020-04-17 DIAGNOSIS — M25512 Pain in left shoulder: Secondary | ICD-10-CM | POA: Diagnosis not present

## 2020-04-17 DIAGNOSIS — M6281 Muscle weakness (generalized): Secondary | ICD-10-CM

## 2020-04-17 DIAGNOSIS — M25511 Pain in right shoulder: Secondary | ICD-10-CM

## 2020-04-17 DIAGNOSIS — R293 Abnormal posture: Secondary | ICD-10-CM

## 2020-04-17 NOTE — Telephone Encounter (Signed)
Pt called stating the hydrocodone and the muscle relaxer are not helping.  Pt states he had physical therapy this am and is in a lot of pain

## 2020-04-17 NOTE — Telephone Encounter (Signed)
Phone call to patient per his MyChart request.  Patient states that the bump hasn't drained any more and he doesn't want to start an antibiotic at this time he would like to speak with Jimmy [name] first at his appointment.  I informed patient that's fine and we'll see him at his appointment.

## 2020-04-17 NOTE — Therapy (Signed)
[facility] Physical Therapy [address] [place], [name], [address] Phone: [phone]   Fax:  [phone]  Physical Therapy Treatment  Patient Details  Name: Jimmy [name] MRN: [id number] Date of Birth: [birth year] Referring Provider (PT): Gregor Hams MD   Encounter Date: 04/17/2020   PT End of Session - 04/17/20 0840    Visit Number 3    Number of Visits 20    PT Start Time 0801    PT Stop Time 0840    PT Time Calculation (min) 39 min    Activity Tolerance Patient tolerated treatment well;No increased pain    Behavior During Therapy WFL for tasks assessed/performed           Past Medical History:  Diagnosis Date  . ADHD (attention deficit hyperactivity disorder)   . Anxiety   . Arthritis   . Cancer ([place])    skin cancer  . Chest pain 08/2018  . GERD (gastroesophageal reflux disease)    no meds  . Migraine    migraines due to pinched nerve  . Pinched nerve in neck   . Scoliosis     Past Surgical History:  Procedure Laterality Date  . ESOPHAGUS SURGERY     as a baby  . SHOULDER ARTHROSCOPY WITH BICEPS TENDON REPAIR Left 03/01/2020   Procedure: LEFT SHOULDER ARTHROSCOPY WITH BICEPS TENODESIS;  Surgeon: Leandrew Koyanagi, MD;  Location: [place];  Service: Orthopedics;  Laterality: Left;  . SHOULDER ARTHROSCOPY WITH SUBACROMIAL DECOMPRESSION Right 09/01/2019   Procedure: RIGHT SHOULDER ARTHROSCOPY WITH EXTENSIVE DEBRIDEMENT AND SUBACROMIAL DECOMPRESSION;  Surgeon: Leandrew Koyanagi, MD;  Location: [place];  Service: Orthopedics;  Laterality: Right;  . VENTRAL HERNIA REPAIR N/A 08/31/2018   Procedure: HERNIA REPAIR VENTRAL ADULT;  Surgeon: Olean Ree, MD;  Location: [facility];  Service: General;  Laterality: N/A;    There were no vitals filed for this visit.    Subjective Assessment - 04/17/20 0801    Subjective reports 8/10 pain in shoulder; usually it's about a 4-5/10.  missed appts due to testing (+) for  COVID; went back to work Jan 4th - still on light duty    Pertinent History L shoulder arthroscopy to address 7-12 o'clock labral tear and biceps tenodesis on 03/01/2020.    Patient Stated Goals Get back to normal and return to work full-duty.    Currently in Pain? Yes    Pain Score 8     Pain Location Shoulder    Pain Orientation Left    Pain Descriptors / Indicators Tightness;Burning;Sore    Pain Type Acute pain    Pain Onset 1 to 4 weeks ago    Pain Frequency Intermittent    Aggravating Factors  LUE use    Pain Relieving Factors meds              [facility] PT Assessment - 04/17/20 0805      Assessment   Medical Diagnosis L shoulder arthroscopy with biceps tendosesis    Referring Provider (PT) Gregor Hams MD    Onset Date/Surgical Date 03/01/20    Hand Dominance Right    Next MD Visit 05/17/20      AROM   Left Shoulder Flexion 156 Degrees    Left Shoulder ABduction 165 Degrees    Left Shoulder Internal Rotation 90 Degrees    Left Shoulder External Rotation 90 Degrees      Strength   Strength Assessment Site Shoulder    Right/Left Shoulder  Left    Left Shoulder Flexion 4/5    Left Shoulder ABduction 4/5   with pain   Left Shoulder Internal Rotation 4/5    Left Shoulder External Rotation 4/5                      Objective measurements completed on examination: See above findings.       [facility] Adult PT Treatment/Exercise - 04/17/20 0805      Shoulder Exercises: Standing   External Rotation Left;Theraband   3x10   Theraband Level (Shoulder External Rotation) Level 3 (Green)    Internal Rotation Left;Theraband   3x10   Theraband Level (Shoulder Internal Rotation) Level 3 (Green)    Flexion Left;Weights   3x10   Shoulder Flexion Weight (lbs) 2    ABduction Left;Weights   3x10   Shoulder ABduction Weight (lbs) 2    Extension Both;Theraband   3x10   Theraband Level (Shoulder Extension) Level 4 (Blue)    Row Both;Theraband   3x10   Theraband Level  (Shoulder Row) Level 4 (Blue)      Shoulder Exercises: Pulleys   Flexion 3 minutes    Scaption 3 minutes      Shoulder Exercises: Stretch   Other Shoulder Stretches standing biceps stretch in doorway - Lt 3x30 sec                       PT Long Term Goals - 03/29/20 0928      PT LONG TERM GOAL #1   Title Improve FOTO to 66.    Status On-going      PT LONG TERM GOAL #2   Title Improve L shoulder AROM to 100% of the uninvolved R.    Status On-going      PT LONG TERM GOAL #3   Title Improve RTC strength to 5/5 MMT for ER and IR.    Status On-going      PT LONG TERM GOAL #4   Title [name] will report L shoulder pain consistently 0-3/10 on the Numeric Pain Rating Scale.    Status On-going      PT LONG TERM GOAL #5   Title [name] will be independent with his long-term HEP at [place].    Status On-going                  Plan - 04/17/20 0840    Clinical Impression Statement Pt tolerated session well today without difficulty.  AROM Lt shoulder is near full ROM and so initiated light strengthening exercises today with expected muscle fatigue.  Will continue to benefit from PT to maximize function.    Personal Factors and Comorbidities Comorbidity 1    Examination-Activity Limitations Bathing;Dressing;Sleep;Hygiene/Grooming;Bed Mobility;Carry;Reach Overhead    Examination-Participation Restrictions Occupation;Community Activity    Stability/Clinical Decision Making Stable/Uncomplicated    Rehab Potential Good    PT Duration Other (comment)    PT Treatment/Interventions ADLs/Self Care Home Management;Iontophoresis 4mg /ml Dexamethasone;Cryotherapy;Therapeutic activities;Therapeutic exercise;Neuromuscular re-education;Patient/family education;Manual techniques;Dry needling;Vasopneumatic Device;Joint Manipulations    PT Next Visit Plan Progress scapular and gentle RTC strength.  gentle strengthening as tolerated    PT Home Exercise Plan Access Code: [email or web address]    Consulted  and Agree with Plan of Care Patient           Patient will benefit from skilled therapeutic intervention in order to improve the following deficits and impairments:  Decreased endurance,Decreased range of motion,Decreased strength,Increased edema,Impaired UE functional use,Pain  Visit Diagnosis:  Abnormal posture  Muscle weakness (generalized)  Stiffness of left shoulder, not elsewhere classified  Acute pain of left shoulder  Left shoulder pain, unspecified chronicity  Right shoulder pain, unspecified chronicity     Problem List Patient Active Problem List   Diagnosis Date Noted  . History of Abnormal LFTs (liver function tests) 12/30/2019  . Abnormal liver ultrasound 12/30/2019  . Fatty liver 12/30/2019  . Right flank pain 12/30/2019  . Abdominal wall pain 12/30/2019  . Gastroesophageal reflux disease without esophagitis 12/30/2019  . 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 Carpal tunnel syndrome 09/08/2019  . Bursitis of shoulder, right 08/31/2019  . Incisional hernia, without obstruction or gangrene 05/20/2018  . Opiate dependence ([place]) 08/02/2013      Laureen Abrahams, PT, DPT 04/17/20 8:42 AM    [facility] Physical Therapy [address] [place], [name], [address] Phone: [phone]   Fax:  [phone]  Name: Jimmy [name] MRN: [id number] Date of Birth: 05/25/1990

## 2020-04-18 ENCOUNTER — Telehealth: Payer: Self-pay | Admitting: Family Medicine

## 2020-04-18 ENCOUNTER — Encounter: Payer: Self-pay | Admitting: Family Medicine

## 2020-04-18 ENCOUNTER — Telehealth: Payer: Self-pay | Admitting: Orthopaedic Surgery

## 2020-04-18 ENCOUNTER — Other Ambulatory Visit: Payer: Self-pay | Admitting: Physician Assistant

## 2020-04-18 NOTE — Telephone Encounter (Signed)
See previous messages.

## 2020-04-18 NOTE — Telephone Encounter (Signed)
Cannot refill the narcotic any longer and too early to refill muscle relaxer.  If he thinks he truly injured his shoulder, he needs to be seen.  Based on recent covid test, he needs to wait until next week before coming in

## 2020-04-18 NOTE — Telephone Encounter (Signed)
Patient called stating he would like to get an MRI done on his left shoulder. The number to contact patient is [phone]

## 2020-04-18 NOTE — Telephone Encounter (Signed)
c 

## 2020-04-18 NOTE — Telephone Encounter (Signed)
Can add 800mg  ibuprofen.  Cannot do anything stronger

## 2020-04-18 NOTE — Telephone Encounter (Signed)
It looks like someone sent in mobic a while back.  Will you see if he is still taking this?  Also, what nsaid does he want me to send in.  We need to make sure only taking one

## 2020-04-18 NOTE — Telephone Encounter (Signed)
Sent mychart msg.

## 2020-04-18 NOTE — Telephone Encounter (Signed)
Patient called, he is concerned that he may have re injured his L shoulder in an incident recently. He has discussed with Dr. Erlinda Hong, but he will not order any imaging.  Pt wanted to schedule a visit with Dr. Georgina Snell and would like xray/MRI of L shoulder repeated. I was unsure if this should be scheduled or if Dr. Erlinda Hong is the primary for patient L shoulder at this point.

## 2020-04-19 ENCOUNTER — Other Ambulatory Visit: Payer: Self-pay | Admitting: Family Medicine

## 2020-04-19 ENCOUNTER — Telehealth: Payer: Self-pay | Admitting: Physical Therapy

## 2020-04-19 ENCOUNTER — Encounter: Payer: No Typology Code available for payment source | Admitting: Physical Therapy

## 2020-04-19 MED ORDER — TIZANIDINE HCL 4 MG PO TABS: 4.0000 mg | ORAL_TABLET | Freq: Three times a day (TID) | ORAL | 1 refills | Status: DC | PRN

## 2020-04-19 NOTE — Telephone Encounter (Signed)
See message from xu

## 2020-04-19 NOTE — Telephone Encounter (Signed)
Appointment scheduled.

## 2020-04-19 NOTE — Telephone Encounter (Signed)
See previous message

## 2020-04-19 NOTE — Telephone Encounter (Signed)
This will require an in person visit.  Please schedule a follow up appointment with me.

## 2020-04-19 NOTE — Telephone Encounter (Signed)
Left VM for pt to call and schedule with Dr. Georgina Snell.

## 2020-04-19 NOTE — Telephone Encounter (Signed)
Want me to have him see you next week?  Or see dean for a second opinion?  He is hard to reason with

## 2020-04-19 NOTE — Telephone Encounter (Signed)
LVM for pt as he NS for PT appt today.  Advised of next scheduled appt and to call office if he needs to cx.  Laureen Abrahams, PT, DPT 04/19/20 8:27 AM

## 2020-04-19 NOTE — Telephone Encounter (Signed)
He's welcome to see anyone for a second opinion.

## 2020-04-20 NOTE — Progress Notes (Signed)
I, Peterson Lombard, LAT, ATC acting as a scribe for Lynne Leader, MD.  Jimmy [name] is a 30 y.o. male who presents to [facility] today for L shoulder pain ongoing since 03/21/20. Pt was last seen by Dr. Georgina Snell on 11/23/19 for a L syndesmotic ankle sprain. Today, pt reports L shoulder has been hurting since he had surgery and never getting back to 100%. MOI: Pt slipped on the loader at work and caught himself w/ L arm. Last week, pt slipped again at home and caught himself w/ L  arm. Last snow fall, pt fell back and caught himself w/ both arms.  Pt locates pain to all over [facility] joint, especially over the anterior and poster aspect of [place] joint.  Of note, pt had L shoulder arthroscopy w/ biceps tendonesis surgery by Dr. Erlinda Hong on 03/01/20. Pt works in Architect.   Additionally he notes persistently bothersome right shoulder pain.  He had right shoulder debridement and subacromial decompression June 2021.  He has persistent pain right shoulder and is seeking a second opinion.  Radiates: yes- down to elbow UE numbness/tingling: yes UE weakness: yes Aggravates: constant pain Rx tried: PT  Dx imaging: 09/21/19 L shoulder MRI  09/21/19 R shoulder XR  Pertinent review of systems: No fevers or chills  Relevant historical information: Left shoulder SLAP tear labrum debridement and biceps tenodesis March 01, 2020.  History of opiate dependency.   Exam:  BP 128/86 (BP Location: Right Arm, Patient Position: Sitting, Cuff Size: Normal)   Pulse 96   Ht 5\' 4"  (1.626 m)   Wt 170 lb 12.5 oz (77.5 kg)   SpO2 98%   BMI 29.31 kg/m  General: Well Developed, well nourished, and in no acute distress.   MSK: Left shoulder normal-appearing with mature appearing scars anterior shoulder. Normal shoulder motion. Intact strength abduction external and internal rotation. Positive Hawkins and Neer's test.  Negative Yergason's and speeds test. Negative O'Brien test.  Right shoulder  normal-appearing normal motion normal strength.  Lab and Radiology Results  Diagnostic Limited MSK Ultrasound of: Left shoulder Subscapularis tendon is intact and normal. Supraspinatus tendon is intact however hypoechoic fluid tracks superficial to tendon consistent with moderate subacromial bursitis. Infraspinatus tendon normal. AC joint normal-appearing Impression: No rotator cuff tear.  Subacromial bursitis.     Assessment and Plan: 30 y.o. male with left shoulder pain for several weeks following labrum debridement and biceps tenodesis.  Patient is having more pain than would be typical at this stage following surgery.  Structurally I think his shoulder is pretty sound.  Advised him to continue physical therapy and that an MRI would not be helpful until least 3 months following surgery.  We may want to consider steroid injection or oral steroids to treat the subacromial bursa usually around the 82-month mark if still problematic.  However I will discuss the situation with his surgeon and we may be able to proceed with steroid injection sooner.  Right shoulder.  At this point patient continues to experience persistent right shoulder pain.  Will refer to Dr. Griffin Basil for independent second opinion.  Opiates.  Patient is postsurgical opiates have been discontinued.  Advised him that it is doubtful that doctors will continue to prescribe opiates for his pain especially given his history of opiate dependency.  I have offered him alternatives to opiates which he declined.  Discussed warning signs or symptoms. Please see discharge instructions. Patient expresses understanding.   The above documentation has been  reviewed and is accurate and complete Lynne Leader, M.D.  Total encounter time 30 minutes including face-to-face time with the patient and, reviewing past medical record, and charting on the date of service.   Treatment plan and options.  Time excluding time to perform the ultrasound

## 2020-04-21 ENCOUNTER — Ambulatory Visit (INDEPENDENT_AMBULATORY_CARE_PROVIDER_SITE_OTHER): Payer: No Typology Code available for payment source | Admitting: Family Medicine

## 2020-04-21 ENCOUNTER — Encounter: Payer: Self-pay | Admitting: Physician Assistant

## 2020-04-21 ENCOUNTER — Ambulatory Visit: Payer: Self-pay

## 2020-04-21 ENCOUNTER — Encounter: Payer: Self-pay | Admitting: Orthopaedic Surgery

## 2020-04-21 ENCOUNTER — Other Ambulatory Visit: Payer: Self-pay

## 2020-04-21 ENCOUNTER — Ambulatory Visit: Payer: No Typology Code available for payment source | Admitting: Physician Assistant

## 2020-04-21 VITALS — BP 128/86 | HR 96 | Ht 64.0 in | Wt 170.8 lb

## 2020-04-21 DIAGNOSIS — M25512 Pain in left shoulder: Secondary | ICD-10-CM | POA: Diagnosis not present

## 2020-04-21 DIAGNOSIS — S43432A Superior glenoid labrum lesion of left shoulder, initial encounter: Secondary | ICD-10-CM

## 2020-04-21 DIAGNOSIS — Z5181 Encounter for therapeutic drug level monitoring: Secondary | ICD-10-CM

## 2020-04-21 DIAGNOSIS — M25511 Pain in right shoulder: Secondary | ICD-10-CM | POA: Diagnosis not present

## 2020-04-21 DIAGNOSIS — L729 Follicular cyst of the skin and subcutaneous tissue, unspecified: Secondary | ICD-10-CM

## 2020-04-21 DIAGNOSIS — L700 Acne vulgaris: Secondary | ICD-10-CM | POA: Diagnosis not present

## 2020-04-21 DIAGNOSIS — L720 Epidermal cyst: Secondary | ICD-10-CM

## 2020-04-21 MED ORDER — TRIAMCINOLONE ACETONIDE 10 MG/ML IJ SUSP
5.0000 mg | Freq: Once | INTRAMUSCULAR | Status: AC
  Administered 2020-04-21: 5 mg

## 2020-04-21 MED ORDER — ISOTRETINOIN 30 MG PO CAPS: 30.0000 mg | ORAL_CAPSULE | Freq: Every day | ORAL | 0 refills | Status: DC

## 2020-04-21 NOTE — Telephone Encounter (Signed)
If his pain is this bad he needs to be seen

## 2020-04-21 NOTE — Patient Instructions (Addendum)
Thank you for coming in today.  I can get you a second opinion.   Follow up with me after March 15th. We can arrange for MRI left shoulder and injection.   I will also talk with Dr Erlinda Hong about possibly left shoulder injection or oral prednisone before then. Marland Kitchen

## 2020-04-22 LAB — COMPREHENSIVE METABOLIC PANEL
AG Ratio: 1.9 (calc) (ref 1.0–2.5)
ALT: 49 U/L — ABNORMAL HIGH (ref 9–46)
AST: 15 U/L (ref 10–40)
Albumin: 4.4 g/dL (ref 3.6–5.1)
Alkaline phosphatase (APISO): 83 U/L (ref 36–130)
BUN: 18 mg/dL (ref 7–25)
CO2: 23 mmol/L (ref 20–32)
Calcium: 9.4 mg/dL (ref 8.6–10.3)
Chloride: 110 mmol/L (ref 98–110)
Creat: 0.82 mg/dL (ref 0.60–1.35)
Globulin: 2.3 g/dL (calc) (ref 1.9–3.7)
Glucose, Bld: 96 mg/dL (ref 65–99)
Potassium: 4.3 mmol/L (ref 3.5–5.3)
Sodium: 142 mmol/L (ref 135–146)
Total Bilirubin: 0.4 mg/dL (ref 0.2–1.2)
Total Protein: 6.7 g/dL (ref 6.1–8.1)

## 2020-04-22 LAB — CBC WITH DIFFERENTIAL/PLATELET
Absolute Monocytes: 352 cells/uL (ref 200–950)
Basophils Absolute: 53 cells/uL (ref 0–200)
Basophils Relative: 0.6 %
Eosinophils Absolute: 70 cells/uL (ref 15–500)
Eosinophils Relative: 0.8 %
HCT: 41.8 % (ref 38.5–50.0)
Hemoglobin: 14.3 g/dL (ref 13.2–17.1)
Lymphs Abs: 2508 cells/uL (ref 850–3900)
MCH: 29.9 pg (ref 27.0–33.0)
MCHC: 34.2 g/dL (ref 32.0–36.0)
MCV: 87.4 fL (ref 80.0–100.0)
MPV: 10 fL (ref 7.5–12.5)
Monocytes Relative: 4 %
Neutro Abs: 5817 cells/uL (ref 1500–7800)
Neutrophils Relative %: 66.1 %
Platelets: 333 10*3/uL (ref 140–400)
RBC: 4.78 10*6/uL (ref 4.20–5.80)
RDW: 12.7 % (ref 11.0–15.0)
Total Lymphocyte: 28.5 %
WBC: 8.8 10*3/uL (ref 3.8–10.8)

## 2020-04-22 LAB — LIPID PANEL
Cholesterol: 198 mg/dL (ref <200)
HDL: 42 mg/dL (ref >=40)
LDL Cholesterol (Calc): 137 mg/dL (calc) — ABNORMAL HIGH
Non-HDL Cholesterol (Calc): 156 mg/dL (calc) — ABNORMAL HIGH (ref <130)
Total CHOL/HDL Ratio: 4.7 (calc) (ref <5.0)
Triglycerides: 89 mg/dL (ref <150)

## 2020-04-25 ENCOUNTER — Encounter: Payer: Self-pay | Admitting: Physician Assistant

## 2020-04-25 ENCOUNTER — Other Ambulatory Visit: Payer: Self-pay | Admitting: Physician Assistant

## 2020-04-25 ENCOUNTER — Telehealth: Payer: Self-pay | Admitting: *Deleted

## 2020-04-25 ENCOUNTER — Other Ambulatory Visit: Payer: Self-pay

## 2020-04-25 ENCOUNTER — Ambulatory Visit (INDEPENDENT_AMBULATORY_CARE_PROVIDER_SITE_OTHER): Payer: No Typology Code available for payment source

## 2020-04-25 ENCOUNTER — Ambulatory Visit (INDEPENDENT_AMBULATORY_CARE_PROVIDER_SITE_OTHER): Payer: No Typology Code available for payment source | Admitting: Surgical

## 2020-04-25 DIAGNOSIS — M25512 Pain in left shoulder: Secondary | ICD-10-CM

## 2020-04-25 DIAGNOSIS — L700 Acne vulgaris: Secondary | ICD-10-CM

## 2020-04-25 DIAGNOSIS — Z9889 Other specified postprocedural states: Secondary | ICD-10-CM

## 2020-04-25 DIAGNOSIS — G8929 Other chronic pain: Secondary | ICD-10-CM | POA: Diagnosis not present

## 2020-04-25 MED ORDER — GABAPENTIN 300 MG PO CAPS: 300.0000 mg | ORAL_CAPSULE | Freq: Three times a day (TID) | ORAL | 0 refills | Status: DC

## 2020-04-25 MED ORDER — HYDROCODONE-ACETAMINOPHEN 5-325 MG PO TABS: 1.0000 | ORAL_TABLET | Freq: Every day | ORAL | 0 refills | Status: DC | PRN

## 2020-04-25 MED ORDER — CELECOXIB 100 MG PO CAPS: 100.0000 mg | ORAL_CAPSULE | Freq: Two times a day (BID) | ORAL | 0 refills | Status: DC

## 2020-04-25 NOTE — Progress Notes (Signed)
Post-Op Visit Note   Patient: Jimmy [name]           Date of Birth: 10/25/1990           MRN: [id number] Visit Date: 04/25/2020 PCP: Charlott Rakes, MD   Assessment & Plan:  Chief Complaint: No chief complaint on file.  Visit Diagnoses:  1. Chronic left shoulder pain   2. S/P arthroscopy of left shoulder     Plan: Patient is a 30 year old male who presents complaining of left shoulder pain.  He has history of SLAP tear that was treated by Dr. Erlinda Hong on 03/01/2020 with left shoulder arthroscopy and biceps tenodesis.  He did well following the injury with steady recovery until January 2022 when he fell while at work on 03/21/2020 and caught himself with his left arm.  After this he slipped on the ice several days later catching himself with both arms causing increased shoulder pain.  He also slipped while trying to get in a truck causing him to fall and having to catch himself overhead with his left arm.  Localizes pain to the anterior aspects of the left shoulder with radiation into the bicep muscle belly.  He also reports pain over the [facility] joint and trapezius muscle with occasional radiation into the shoulder blade.  No radiation past the elbow.  Only occasional numbness and tingling but nothing consistent.  No significant neck pain.  He has seen Tawanna Cooler PA-C multiple times with no evidence of infection or Popeye deformity noted previously.  He also saw Dr. Georgina Snell on 04/21/2020 who performed ultrasound that was only significant for subacromial bursitis.  He works as a Nature conservation officer and has a history of opioid dependency in the past.   Today on exam he has 35 degrees external rotation, 95 degrees abduction, 160 degrees forward flexion.  No Popeye deformity is noted and bicep contour seems equivalent to the contralateral side.  No crepitus is felt with passive range of motion of the right shoulder.  He has excellent rotator cuff strength with 5/5 motor strength of supraspinatus,  infraspinatus, subscapularis.  Positive Neer and Hawkins impingement sign.  He does have asymmetric left AC joint tenderness has not present on the contralateral side.  Pain with crossarm adduction that is localized to the [facility] joint.  No tenderness over the cervical spine.  Mild pain that he localizes to the trapezius muscle with active cervical spine range of motion, particularly looking left and right.  Mild weakness of supraspinatus and bicep flexion compared with the right side.  Incisions from prior surgery are well-healed with no evidence of infection, cellulitis, dehiscence.  Impression is left shoulder pain with intact biceps tenodesis by exam, though disruption cannot be definitively ruled out without advanced imaging.  MRI would likely not be very beneficial less than 2 months out from procedure.  He does have suggestion of pain coming from his [facility] joint which may be contributing to this.  There is also a possible source of referred neck pain though this seems to be unlikely.  He has had prior MRI of the cervical spine that did show right-sided findings but no left-sided findings that can April 2021.  Discussed options available to patient.  Prescribe Celebrex and gabapentin for pain control as well as a one-time prescription of Norco to take once per day as needed.  This must last him to his next appointment and will be the final opioid prescription that I prescribe.  He understands and agrees with this  plan.  Also gave him a work note to keep him on light duty for the next 4 weeks and advised that he take 2 weeks off of physical therapy.  He has fairly good range of motion and may be beneficial to take some time off and allow the shoulder to rest before going back to therapy. Plan to follow-up with Dr. Erlinda Hong in 4 weeks or with Dr. Marlou Sa if Dr. Erlinda Hong decides he would like another opinion on the shoulder.   Follow-Up Instructions: No follow-ups on file.   Orders:  No orders of the defined types were placed  in this encounter.  No orders of the defined types were placed in this encounter.   Imaging: No results found.  PMFS History: Patient Active Problem List   Diagnosis Date Noted  . History of Abnormal LFTs (liver function tests) 12/30/2019  . Abnormal liver ultrasound 12/30/2019  . Fatty liver 12/30/2019  . Right flank pain 12/30/2019  . Abdominal wall pain 12/30/2019  . Gastroesophageal reflux disease without esophagitis 12/30/2019  . 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 Carpal tunnel syndrome 09/08/2019  . Bursitis of shoulder, right 08/31/2019  . Incisional hernia, without obstruction or gangrene 05/20/2018  . Opiate dependence ([place]) 08/02/2013   Past Medical History:  Diagnosis Date  . ADHD (attention deficit hyperactivity disorder)   . Anxiety   . Arthritis   . Cancer ([place])    skin cancer  . Chest pain 08/2018  . GERD (gastroesophageal reflux disease)    no meds  . Migraine    migraines due to pinched nerve  . Pinched nerve in neck   . Scoliosis     Family History  Problem Relation Age of Onset  . Diabetes Mother   . Kidney cancer Mother   . Diabetes Maternal Grandmother   . Cancer Maternal Grandmother        unknown type. doing chemo  . Cancer Maternal Grandfather   . Diabetes Maternal Aunt   . Stomach cancer Neg Hx   . Pancreatic cancer Neg Hx   . Esophageal cancer Neg Hx   . Liver disease Neg Hx   . Colon cancer Neg Hx   . Rectal cancer Neg Hx     Past Surgical History:  Procedure Laterality Date  . ESOPHAGUS SURGERY     as a baby  . SHOULDER ARTHROSCOPY WITH BICEPS TENDON REPAIR Left 03/01/2020   Procedure: LEFT SHOULDER ARTHROSCOPY WITH BICEPS TENODESIS;  Surgeon: Leandrew Koyanagi, MD;  Location: [place];  Service: Orthopedics;  Laterality: Left;  . SHOULDER ARTHROSCOPY WITH SUBACROMIAL DECOMPRESSION Right 09/01/2019   Procedure: RIGHT SHOULDER ARTHROSCOPY WITH EXTENSIVE DEBRIDEMENT AND  SUBACROMIAL DECOMPRESSION;  Surgeon: Leandrew Koyanagi, MD;  Location: [place];  Service: Orthopedics;  Laterality: Right;  . VENTRAL HERNIA REPAIR N/A 08/31/2018   Procedure: HERNIA REPAIR VENTRAL ADULT;  Surgeon: Olean Ree, MD;  Location: [facility];  Service: General;  Laterality: N/A;   Social History   Occupational History  . Not on file  Tobacco Use  . Smoking status: Current Every Day Smoker    Packs/day: 1.00    Years: 15.00    Pack years: 15.00    Types: Cigarettes  . Smokeless tobacco: Former Network engineer  . Vaping Use: Former  Substance and Sexual Activity  . Alcohol use: Not Currently  . Drug use: Not Currently  . Sexual activity: Yes

## 2020-04-25 NOTE — Telephone Encounter (Signed)
[facility] called needing patient iplede number- pharmacist states patient gave them the wrong number.  The correct ipledge number was given to patient.

## 2020-04-26 ENCOUNTER — Encounter: Payer: No Typology Code available for payment source | Admitting: Physical Therapy

## 2020-04-27 ENCOUNTER — Encounter: Payer: Self-pay | Admitting: Physician Assistant

## 2020-04-27 LAB — ANAEROBIC AND AEROBIC CULTURE
MICRO NUMBER:: 11496882
MICRO NUMBER:: 11496883
SPECIMEN QUALITY:: ADEQUATE
SPECIMEN QUALITY:: ADEQUATE

## 2020-04-27 NOTE — Progress Notes (Addendum)
   Follow-Up Visit   Subjective  Jimmy [name] is a 30 y.o. male who presents for the following: Follow-up (Per epic 20 mg new start, patient stated active lesion on buttock ).   The following portions of the chart were reviewed this encounter and updated as appropriate:  Tobacco  Allergies  Meds  Problems  Med Hx  Surg Hx  Fam Hx      Objective  Well appearing patient in no apparent distress; mood and affect are within normal limits.  A full examination was performed including scalp, head, eyes, ears, nose, lips, neck, chest, axillae, abdomen, back, buttocks, bilateral upper extremities, bilateral lower extremities, hands, feet, fingers, toes, fingernails, and toenails. All findings within normal limits unless otherwise noted below.  Objective  Waist up: Erythematous papules and pustules with comedones. Moderate improvement with less comedomes.  Objective  Right Hip (side) - Posterior: Solitary, smooth skin colored to translucent papule.   Assessment & Plan  Acne vulgaris Waist up  Continue isotretinoin- follow up 31 days - fasting labs next month- dose increased  ISOtretinoin (ACCUTANE) 30 MG capsule - Waist up  CBC with Differential/Platelet - Waist up  Comprehensive metabolic panel - Waist up  Lipid panel - Waist up  Encounter for therapeutic drug monitoring  Other Related Procedures CBC with Differential/Platelet Comprehensive metabolic panel Lipid panel  Ordered Medications: ISOtretinoin (ACCUTANE) 30 MG capsule  Follicular cyst of skin and subcutaneous tissue Right Hip (side) - Posterior  Anaerobic and Aerobic Culture - Right Hip (side) - Posterior  Intralesional injection - Right Hip (side) - Posterior Location: buttock  Informed Consent: Discussed risks (infection, pain, bleeding, bruising, thinning of the skin, loss of skin pigment, lack of resolution, and recurrence of lesion) and benefits of the procedure, as well as the alternatives.  Informed consent was obtained. Preparation: The area was prepared a standard fashion.  Anesthesia:none  Procedure Details: An intralesional injection was performed with Kenalog 20 mg/cc. 0.5 cc in total were injected.  Total number of injections: 1  Plan: The patient was instructed on post-op care. Recommend OTC analgesia as needed for pain.    I, Tricha Ruggirello, PA-C, have reviewed all documentation's for this visit.  The documentation on 05/11/20 for the exam, diagnosis, procedures and orders are all accurate and complete.

## 2020-04-28 ENCOUNTER — Ambulatory Visit: Payer: No Typology Code available for payment source | Admitting: Orthopaedic Surgery

## 2020-04-30 ENCOUNTER — Encounter: Payer: Self-pay | Admitting: Surgical

## 2020-05-05 ENCOUNTER — Encounter: Payer: No Typology Code available for payment source | Admitting: Physical Therapy

## 2020-05-10 ENCOUNTER — Telehealth: Payer: Self-pay | Admitting: Family Medicine

## 2020-05-10 ENCOUNTER — Ambulatory Visit (INDEPENDENT_AMBULATORY_CARE_PROVIDER_SITE_OTHER): Payer: No Typology Code available for payment source | Admitting: Orthopedic Surgery

## 2020-05-10 ENCOUNTER — Encounter: Payer: Self-pay | Admitting: Physical Therapy

## 2020-05-10 ENCOUNTER — Other Ambulatory Visit: Payer: Self-pay

## 2020-05-10 ENCOUNTER — Ambulatory Visit (INDEPENDENT_AMBULATORY_CARE_PROVIDER_SITE_OTHER): Payer: No Typology Code available for payment source | Admitting: Physical Therapy

## 2020-05-10 DIAGNOSIS — M25612 Stiffness of left shoulder, not elsewhere classified: Secondary | ICD-10-CM

## 2020-05-10 DIAGNOSIS — M6281 Muscle weakness (generalized): Secondary | ICD-10-CM | POA: Diagnosis not present

## 2020-05-10 DIAGNOSIS — R293 Abnormal posture: Secondary | ICD-10-CM | POA: Diagnosis not present

## 2020-05-10 DIAGNOSIS — Z9889 Other specified postprocedural states: Secondary | ICD-10-CM

## 2020-05-10 DIAGNOSIS — M25512 Pain in left shoulder: Secondary | ICD-10-CM | POA: Diagnosis not present

## 2020-05-10 DIAGNOSIS — M25511 Pain in right shoulder: Secondary | ICD-10-CM

## 2020-05-10 NOTE — Telephone Encounter (Signed)
Pt called, does not want to be referred to [facility]. Would like referral changed to a different Ortho practice.

## 2020-05-10 NOTE — Therapy (Addendum)
[facility] Therapy [address] [place], [name], [address] Phone: [phone]   Fax:  [phone]  Physical Therapy Treatment/Discharge Summary  Patient Details  Name: Jimmy [name] MRN: [id number] Date of Birth: 12-26-1990 Referring Provider (PT): Gregor Hams MD   Encounter Date: 05/10/2020   PT End of Session - 05/10/20 0821    Visit Number 4    Number of Visits 20    PT Start Time 0806    PT Stop Time 0816    PT Time Calculation (min) 10 min    Activity Tolerance Patient tolerated treatment well;No increased pain    Behavior During Therapy WFL for tasks assessed/performed           Past Medical History:  Diagnosis Date  . ADHD (attention deficit hyperactivity disorder)   . Anxiety   . Arthritis   . Cancer ([place])    skin cancer  . Chest pain 08/2018  . GERD (gastroesophageal reflux disease)    no meds  . Migraine    migraines due to pinched nerve  . Pinched nerve in neck   . Scoliosis     Past Surgical History:  Procedure Laterality Date  . ESOPHAGUS SURGERY     as a baby  . SHOULDER ARTHROSCOPY WITH BICEPS TENDON REPAIR Left 03/01/2020   Procedure: LEFT SHOULDER ARTHROSCOPY WITH BICEPS TENODESIS;  Surgeon: Leandrew Koyanagi, MD;  Location: [place];  Service: Orthopedics;  Laterality: Left;  . SHOULDER ARTHROSCOPY WITH SUBACROMIAL DECOMPRESSION Right 09/01/2019   Procedure: RIGHT SHOULDER ARTHROSCOPY WITH EXTENSIVE DEBRIDEMENT AND SUBACROMIAL DECOMPRESSION;  Surgeon: Leandrew Koyanagi, MD;  Location: [place];  Service: Orthopedics;  Laterality: Right;  . VENTRAL HERNIA REPAIR N/A 08/31/2018   Procedure: HERNIA REPAIR VENTRAL ADULT;  Surgeon: Olean Ree, MD;  Location: [facility];  Service: General;  Laterality: N/A;    There were no vitals filed for this visit.   Subjective Assessment - 05/10/20 0809    Subjective nothing has changed, MD said to take 2 weeks off from PT so "I did what the doctor  said." and also report no change in pain.    Pertinent History L shoulder arthroscopy to address 7-12 o'clock labral tear and biceps tenodesis on 03/01/2020.    Patient Stated Goals Get back to normal and return to work full-duty.    Currently in Pain? Yes    Pain Score 8    at best 7/10; up to 9-10/10   Pain Location Shoulder    Pain Orientation Left    Pain Descriptors / Indicators Sharp;Aching;Dull    Pain Type Acute pain;Surgical pain    Pain Onset 1 to 4 weeks ago    Pain Frequency Intermittent    Aggravating Factors  any activity or use with LUE    Pain Relieving Factors "nothing really"              [facility] PT Assessment - 05/10/20 0811      Assessment   Medical Diagnosis L shoulder arthroscopy with biceps tendosesis    Referring Provider (PT) Gregor Hams MD    Onset Date/Surgical Date 03/01/20    Next MD Visit 05/10/20      AROM   Overall AROM Comments pain with all motions    Left Shoulder Flexion 157 Degrees    Left Shoulder ABduction 131 Degrees   138 passive - resisting   Left Shoulder Internal Rotation 90 Degrees   FIR WNL   Left Shoulder  External Rotation 90 Degrees      Strength   Left Shoulder Flexion 5/5    Left Shoulder ABduction 4/5    Left Shoulder Internal Rotation 5/5    Left Shoulder External Rotation 4/5                                      PT Long Term Goals - 03/29/20 0928      PT LONG TERM GOAL #1   Title Improve FOTO to 66.    Status On-going      PT LONG TERM GOAL #2   Title Improve L shoulder AROM to 100% of the uninvolved R.    Status On-going      PT LONG TERM GOAL #3   Title Improve RTC strength to 5/5 MMT for ER and IR.    Status On-going      PT LONG TERM GOAL #4   Title Jean will report L shoulder pain consistently 0-3/10 on the Numeric Pain Rating Scale.    Status On-going      PT LONG TERM GOAL #5   Title Nochum will be independent with his long-term HEP at [place].    Status On-going                  Plan - 05/10/20 2025    Clinical Impression Statement Pt arrived to PT with c/o continued pain and c/o that MD's are not helping him with his Lt shoulder.  Discussed objective findings with patient and clinical indications and pt continued to be argumentative with PT over pain.  Offered option to cancel appt until he sees MD.  He left office stating "I'll just wait for the MD."    Personal Factors and Comorbidities Comorbidity 1    Examination-Activity Limitations Bathing;Dressing;Sleep;Hygiene/Grooming;Bed Mobility;Carry;Reach Overhead    Examination-Participation Restrictions Occupation;Community Activity    Stability/Clinical Decision Making Stable/Uncomplicated    Rehab Potential Good    PT Duration Other (comment)    PT Treatment/Interventions ADLs/Self Care Home Management;Iontophoresis 50m/ml Dexamethasone;Cryotherapy;Therapeutic activities;Therapeutic exercise;Neuromuscular re-education;Patient/family education;Manual techniques;Dry needling;Vasopneumatic Device;Joint Manipulations    PT Next Visit Plan see what MD says    PT Home Exercise Plan Access Code: [email or web address]    Consulted and Agree with Plan of Care Patient           Patient will benefit from skilled therapeutic intervention in order to improve the following deficits and impairments:  Decreased endurance,Decreased range of motion,Decreased strength,Increased edema,Impaired UE functional use,Pain  Visit Diagnosis: Abnormal posture  Muscle weakness (generalized)  Stiffness of left shoulder, not elsewhere classified  Left shoulder pain, unspecified chronicity  Acute pain of left shoulder     Problem List Patient Active Problem List   Diagnosis Date Noted  . History of Abnormal LFTs (liver function tests) 12/30/2019  . Abnormal liver ultrasound 12/30/2019  . Fatty liver 12/30/2019  . Right flank pain 12/30/2019  . Abdominal wall pain 12/30/2019  . Gastroesophageal reflux disease without esophagitis  12/30/2019  . 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 Carpal tunnel syndrome 09/08/2019  . Bursitis of shoulder, right 08/31/2019  . Incisional hernia, without obstruction or gangrene 05/20/2018  . Opiate dependence ([place] 08/02/2013      SLaureen Abrahams PT, DPT 05/10/20 8:26 AM    [facility] [address] [address]Phone: [phone]  Fax:  [phone]  Name: Jimmy [name] MRN: [id number] Date of Birth: 02/27/1991    PHYSICAL THERAPY DISCHARGE SUMMARY  Visits from Start of Care: 4  Current functional level related to goals / functional outcomes: See above   Remaining deficits: See above   Education / Equipment: HEP  Plan: Patient agrees to discharge.  Patient goals were not met. Patient is being discharged due to a change in medical status.  ?????     Laureen Abrahams, PT, DPT 06/21/20 12:22 PM  [facility] [address] [place], [name], [address] Phone: [phone]   Fax:  [phone]

## 2020-05-10 NOTE — Telephone Encounter (Signed)
Referring to [facility] orthopedics.

## 2020-05-11 MED ORDER — TRIAMCINOLONE ACETONIDE 10 MG/ML IJ SUSP: 20.0000 mg | Freq: Once | INTRAMUSCULAR | Status: DC

## 2020-05-11 NOTE — Addendum Note (Signed)
Addended by: Robyne Askew R on: 05/11/2020 11:17 AM   Modules accepted: Orders

## 2020-05-12 NOTE — Telephone Encounter (Signed)
Pt informed of referral change to [facility] and given their phone # as he is having trouble receiving incoming calls.

## 2020-05-15 ENCOUNTER — Encounter: Payer: No Typology Code available for payment source | Admitting: Physical Therapy

## 2020-05-16 ENCOUNTER — Telehealth: Payer: Self-pay | Admitting: Physician Assistant

## 2020-05-16 NOTE — Telephone Encounter (Signed)
Wants to talk to nurse about thing on his back.

## 2020-05-17 ENCOUNTER — Ambulatory Visit: Payer: No Typology Code available for payment source | Admitting: Orthopaedic Surgery

## 2020-05-17 ENCOUNTER — Other Ambulatory Visit: Payer: Self-pay | Admitting: Orthopaedic Surgery

## 2020-05-17 ENCOUNTER — Encounter: Payer: Self-pay | Admitting: Orthopedic Surgery

## 2020-05-17 ENCOUNTER — Encounter: Payer: Self-pay | Admitting: Orthopaedic Surgery

## 2020-05-17 MED ORDER — METAXALONE 800 MG PO TABS: 800.0000 mg | ORAL_TABLET | Freq: Three times a day (TID) | ORAL | 0 refills | Status: DC

## 2020-05-17 MED ORDER — ACETAMINOPHEN-CODEINE #3 300-30 MG PO TABS: 1.0000 | ORAL_TABLET | Freq: Every day | ORAL | 0 refills | Status: AC | PRN

## 2020-05-17 NOTE — Telephone Encounter (Signed)
Xu just sent in Tylenol #3

## 2020-05-17 NOTE — Progress Notes (Signed)
Post-Op Visit Note   Patient: Jimmy [name]           Date of Birth: [birth year]           MRN: [id number] Visit Date: 05/10/2020 PCP: Charlott Rakes, MD   Assessment & Plan:  Chief Complaint:  Chief Complaint  Patient presents with  . Left Shoulder - Pain   Visit Diagnoses:  1. S/P arthroscopy of left shoulder     Plan: Patient presents for evaluation after left shoulder surgery.  Patient originally had an injury about 10 years ago.  He is right-hand dominant.  He had a SLAP tear which was primarily posterior.  He underwent debridement and arthroscopic biceps tenodesis 03/01/2020.  He has been going to physical therapy.  Reports fairly constant pain.  No relief from surgery.  Describes a lot of biceps pain but no instability symptoms.  Does report some neck pain.  He has fallen 3 times since surgery.  Was back at light duty 3 weeks after surgery and he slipped and caught himself with his left hand on 03/21/2020.  He also states that when it is noted he slipped and fell backwards.  He also states that he was jumping off a car and slipped and held his body weight with his arm.  On examination he is got pretty reasonable rotator cuff strength but Popeye deformity is present.  Does localize pain in that biceps area.  Strength to supination is symmetric between sides.  Radial pulses intact.  No evidence of restriction of passive external rotation or forward flexion between left and right shoulder.  Impression is left shoulder pain with possible disruption of the biceps tenodesis from multiple falls following surgery.  His shoulder does not feel unstable on exam.  He still is having primarily pain but it is anterior and could be related to the biceps tenodesis which has torn loose.  Plan is MRI of the left shoulder to evaluate what his surgical options are.  I did tell him that it would be exceedingly difficult to explore the anterior arm and find that biceps tendon and retenodesis.  I think he  may be having some spasm symptoms in that arm related to the retracted biceps tendon but finding it 3 months out from surgery would be exceedingly difficult.  Plan to see him back after the scan  Follow-Up Instructions: Return for after MRI.   Orders:  Orders Placed This Encounter  Procedures  . MR Shoulder Left w/ contrast  . Arthrogram   No orders of the defined types were placed in this encounter.   Imaging: No results found.  PMFS History: Patient Active Problem List   Diagnosis Date Noted  . History of Abnormal LFTs (liver function tests) 12/30/2019  . Abnormal liver ultrasound 12/30/2019  . Fatty liver 12/30/2019  . Right flank pain 12/30/2019  . Abdominal wall pain 12/30/2019  . Gastroesophageal reflux disease without esophagitis 12/30/2019  . 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 Carpal tunnel syndrome 09/08/2019  . Bursitis of shoulder, right 08/31/2019  . Incisional hernia, without obstruction or gangrene 05/20/2018  . Opiate dependence ([place]) 08/02/2013   Past Medical History:  Diagnosis Date  . ADHD (attention deficit hyperactivity disorder)   . Anxiety   . Arthritis   . Cancer ([place])    skin cancer  . Chest pain 08/2018  . GERD (gastroesophageal reflux disease)    no meds  . Migraine    migraines due to  pinched nerve  . Pinched nerve in neck   . Scoliosis     Family History  Problem Relation Age of Onset  . Diabetes Mother   . Kidney cancer Mother   . Diabetes Maternal Grandmother   . Cancer Maternal Grandmother        unknown type. doing chemo  . Cancer Maternal Grandfather   . Diabetes Maternal Aunt   . Stomach cancer Neg Hx   . Pancreatic cancer Neg Hx   . Esophageal cancer Neg Hx   . Liver disease Neg Hx   . Colon cancer Neg Hx   . Rectal cancer Neg Hx     Past Surgical History:  Procedure Laterality Date  . ESOPHAGUS SURGERY     as a baby  . SHOULDER ARTHROSCOPY WITH BICEPS TENDON REPAIR Left  03/01/2020   Procedure: LEFT SHOULDER ARTHROSCOPY WITH BICEPS TENODESIS;  Surgeon: Leandrew Koyanagi, MD;  Location: [place];  Service: Orthopedics;  Laterality: Left;  . SHOULDER ARTHROSCOPY WITH SUBACROMIAL DECOMPRESSION Right 09/01/2019   Procedure: RIGHT SHOULDER ARTHROSCOPY WITH EXTENSIVE DEBRIDEMENT AND SUBACROMIAL DECOMPRESSION;  Surgeon: Leandrew Koyanagi, MD;  Location: [place];  Service: Orthopedics;  Laterality: Right;  . VENTRAL HERNIA REPAIR N/A 08/31/2018   Procedure: HERNIA REPAIR VENTRAL ADULT;  Surgeon: Olean Ree, MD;  Location: [facility];  Service: General;  Laterality: N/A;   Social History   Occupational History  . Not on file  Tobacco Use  . Smoking status: Current Every Day Smoker    Packs/day: 1.00    Years: 15.00    Pack years: 15.00    Types: Cigarettes  . Smokeless tobacco: Former Network engineer  . Vaping Use: Former  Substance and Sexual Activity  . Alcohol use: Not Currently  . Drug use: Not Currently  . Sexual activity: Yes

## 2020-05-23 ENCOUNTER — Other Ambulatory Visit: Payer: Self-pay | Admitting: Family Medicine

## 2020-05-23 ENCOUNTER — Encounter: Payer: Self-pay | Admitting: Family Medicine

## 2020-05-23 MED ORDER — CETIRIZINE HCL 10 MG PO TABS: 10.0000 mg | ORAL_TABLET | Freq: Every day | ORAL | 1 refills | Status: DC

## 2020-05-25 ENCOUNTER — Encounter: Payer: Self-pay | Admitting: Physician Assistant

## 2020-05-25 ENCOUNTER — Ambulatory Visit (INDEPENDENT_AMBULATORY_CARE_PROVIDER_SITE_OTHER): Payer: No Typology Code available for payment source | Admitting: Physician Assistant

## 2020-05-25 ENCOUNTER — Other Ambulatory Visit: Payer: Self-pay

## 2020-05-25 DIAGNOSIS — Z5181 Encounter for therapeutic drug level monitoring: Secondary | ICD-10-CM | POA: Diagnosis not present

## 2020-05-25 DIAGNOSIS — L700 Acne vulgaris: Secondary | ICD-10-CM | POA: Diagnosis not present

## 2020-05-25 MED ORDER — ISOTRETINOIN 30 MG PO CAPS: 30.0000 mg | ORAL_CAPSULE | Freq: Every day | ORAL | 0 refills | Status: DC

## 2020-05-25 NOTE — Patient Instructions (Signed)
Hidradenitis Suppurativa Hidradenitis suppurativa is a long-term (chronic) skin disease. It is similar to a severe form of acne, but it affects areas of the body where acne would be unusual, especially areas of the body where skin rubs against skin and becomes moist. These include:  Underarms.  Groin.  Genital area.  Buttocks.  Upper thighs.  Breasts. Hidradenitis suppurativa may start out as small lumps or pimples caused by blocked sweat glands or hair follicles. Pimples may develop into deep sores that break open (rupture) and drain pus. Over time, affected areas of skin may thicken and become scarred. This condition is rare and does not spread from person to person (non-contagious). What are the causes? The exact cause of this condition is not known. It may be related to:  Male and male hormones.  An overactive disease-fighting system (immune system). The immune system may over-react to blocked hair follicles or sweat glands and cause swelling and pus-filled sores. What increases the risk? You are more likely to develop this condition if you:  Are male.  Are 11-55 years old.  Have a family history of hidradenitis suppurativa.  Have a personal history of acne.  Are overweight.  Smoke.  Take the medicine lithium. What are the signs or symptoms? The first symptoms are usually painful bumps in the skin, similar to pimples. The condition may get worse over time (progress), or it may only cause mild symptoms. If the disease progresses, symptoms may include:  Skin bumps getting bigger and growing deeper into the skin.  Bumps rupturing and draining pus.  Itchy, infected skin.  Skin getting thicker and scarred.  Tunnels under the skin (fistulas) where pus drains from a bump.  Pain during daily activities, such as pain during walking if your groin area is affected.  Emotional problems, such as stress or depression. This condition may affect your appearance and your  ability or willingness to wear certain clothes or do certain activities. How is this diagnosed? This condition is diagnosed by a health care provider who specializes in skin diseases (dermatologist). You may be diagnosed based on:  Your symptoms and medical history.  A physical exam.  Testing a pus sample for infection.  Blood tests. How is this treated? Your treatment will depend on how severe your symptoms are. The same treatment will not work for everybody with this condition. You may need to try several treatments to find what works best for you. Treatment may include:  Cleaning and bandaging (dressing) your wounds as needed.  Lifestyle changes, such as new skin care routines.  Taking medicines, such as: ? Antibiotics. ? Acne medicines. ? Medicines to reduce the activity of the immune system. ? A diabetes medicine (metformin). ? Birth control pills, for women. ? Steroids to reduce swelling and pain.  Working with a mental health care provider, if you experience emotional distress due to this condition. If you have severe symptoms that do not get better with medicine, you may need surgery. Surgery may involve:  Using a laser to clear the skin and remove hair follicles.  Opening and draining deep sores.  Removing the areas of skin that are diseased and scarred. Follow these instructions at home: Medicines  Take over-the-counter and prescription medicines only as told by your health care provider.  If you were prescribed an antibiotic medicine, take it as told by your health care provider. Do not stop taking the antibiotic even if your condition improves.   Skin care  If you have open wounds,   cover them with a clean dressing as told by your health care provider. Keep wounds clean by washing them gently with soap and water when you bathe.  Do not shave the areas where you get hidradenitis suppurativa.  Do not wear deodorant.  Wear loose-fitting clothes.  Try to avoid  getting overheated or sweaty. If you get sweaty or wet, change into clean, dry clothes as soon as you can.  To help relieve pain and itchiness, cover sore areas with a warm, clean washcloth (warm compress) for 5-10 minutes as often as needed.  If told by your health care provider, take a bleach bath twice a week: ? Fill your bathtub halfway with water. ? Pour in  cup of unscented household bleach. ? Soak in the tub for 5-10 minutes. ? Only soak from the neck down. Avoid water on your face and hair. ? Shower to rinse off the bleach from your skin. General instructions  Learn as much as you can about your disease so that you have an active role in your treatment. Work closely with your health care provider to find treatments that work for you.  If you are overweight, work with your health care provider to lose weight as recommended.  Do not use any products that contain nicotine or tobacco, such as cigarettes and e-cigarettes. If you need help quitting, ask your health care provider.  If you struggle with living with this condition, talk with your health care provider or work with a mental health care provider as recommended.  Keep all follow-up visits as told by your health care provider. This is important. Where to find more information  Hidradenitis Suppurativa Foundation, Inc.: https://www.hs-foundation.org/  American Academy of Dermatology: https://www.aad.org Contact a health care provider if you have:  A flare-up of hidradenitis suppurativa.  A fever or chills.  Trouble controlling your symptoms at home.  Trouble doing your daily activities because of your symptoms.  Trouble dealing with emotional problems related to your condition. Summary  Hidradenitis suppurativa is a long-term (chronic) skin disease. It is similar to a severe form of acne, but it affects areas of the body where acne would be unusual.  The first symptoms are usually painful bumps in the skin, similar  to pimples. The condition may only cause mild symptoms, or it may get worse over time (progress).  If you have open wounds, cover them with a clean dressing as told by your health care provider. Keep wounds clean by washing them gently with soap and water when you bathe.  Besides skin care, treatment may include medicines, laser treatment, and surgery. This information is not intended to replace advice given to you by your health care provider. Make sure you discuss any questions you have with your health care provider. Document Revised: 12/28/2019 Document Reviewed: 12/28/2019 Elsevier Patient Education  2021 Elsevier Inc.  

## 2020-05-26 ENCOUNTER — Other Ambulatory Visit: Payer: Self-pay | Admitting: Orthopaedic Surgery

## 2020-05-26 MED ORDER — TIZANIDINE HCL 4 MG PO TABS: 4.0000 mg | ORAL_TABLET | Freq: Four times a day (QID) | ORAL | 2 refills | Status: DC | PRN

## 2020-05-30 ENCOUNTER — Ambulatory Visit: Payer: No Typology Code available for payment source | Admitting: Family Medicine

## 2020-06-01 ENCOUNTER — Ambulatory Visit
Admission: RE | Admit: 2020-06-01 | Discharge: 2020-06-01 | Disposition: A | Discharge status: Home / Self Care | Payer: No Typology Code available for payment source | Source: Ambulatory Visit | Attending: Orthopedic Surgery | Admitting: Orthopedic Surgery

## 2020-06-01 DIAGNOSIS — Z9889 Other specified postprocedural states: Secondary | ICD-10-CM

## 2020-06-01 IMAGING — XA DG FLUORO GUIDE NDL PLC/BX
2 series · 2 of 2 positions shown · IV contrast (multihance)
Comparison: none

CLINICAL DATA: Left shoulder pain.  Prior arthroscopy.

EXAM:
LEFT SHOULDER INJECTION UNDER FLUOROSCOPY
TECHNIQUE: An appropriate skin entrance site was determined. The site was
marked, prepped with Betadine, draped in the usual sterile fashion,
and infiltrated locally with 1% lidocaine. A 22 gauge spinal needle
was advanced to the superomedial margin of the humeral head under
intermittent fluoroscopy. 1 mL of 1% lidocaine injected easily. A
mixture of 0.1 mL of MultiHance, 15 mL of Isovue-M 200, and 5 mL of
sterile saline was then used to opacify the left shoulder capsule.
12 mL of this mixture were injected. No immediate complication.
FLUOROSCOPY TIME:  Fluoroscopy Time:  7 seconds
Radiation Exposure Index (if provided by the fluoroscopic device):
2.18 microGray*m^2
Number of Acquired Spot Images: 0

[Series 1: ortho standard · 1 of 1 slices shown (1 of 2)]
[im 1/1]
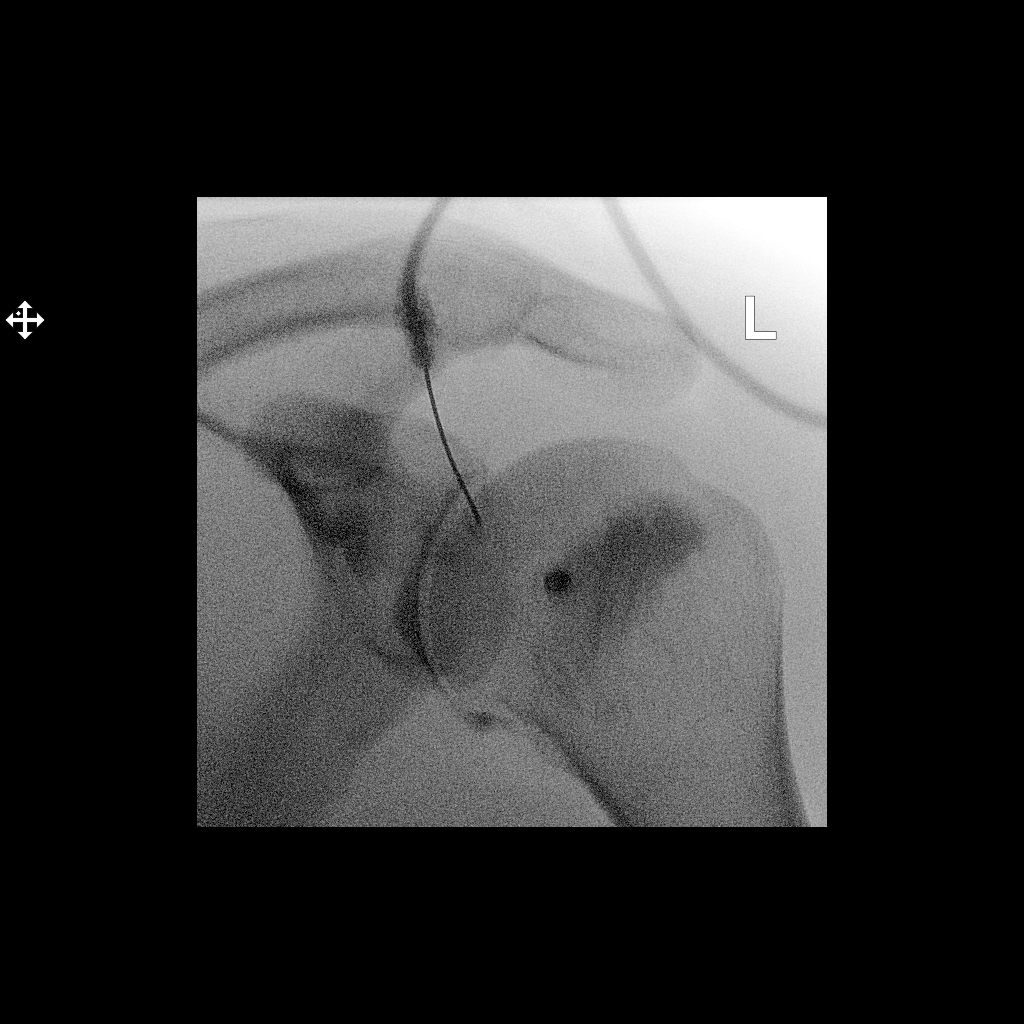

[Series 2: ortho standard · 1 of 1 slices shown (2 of 2)]
[im 1/1]
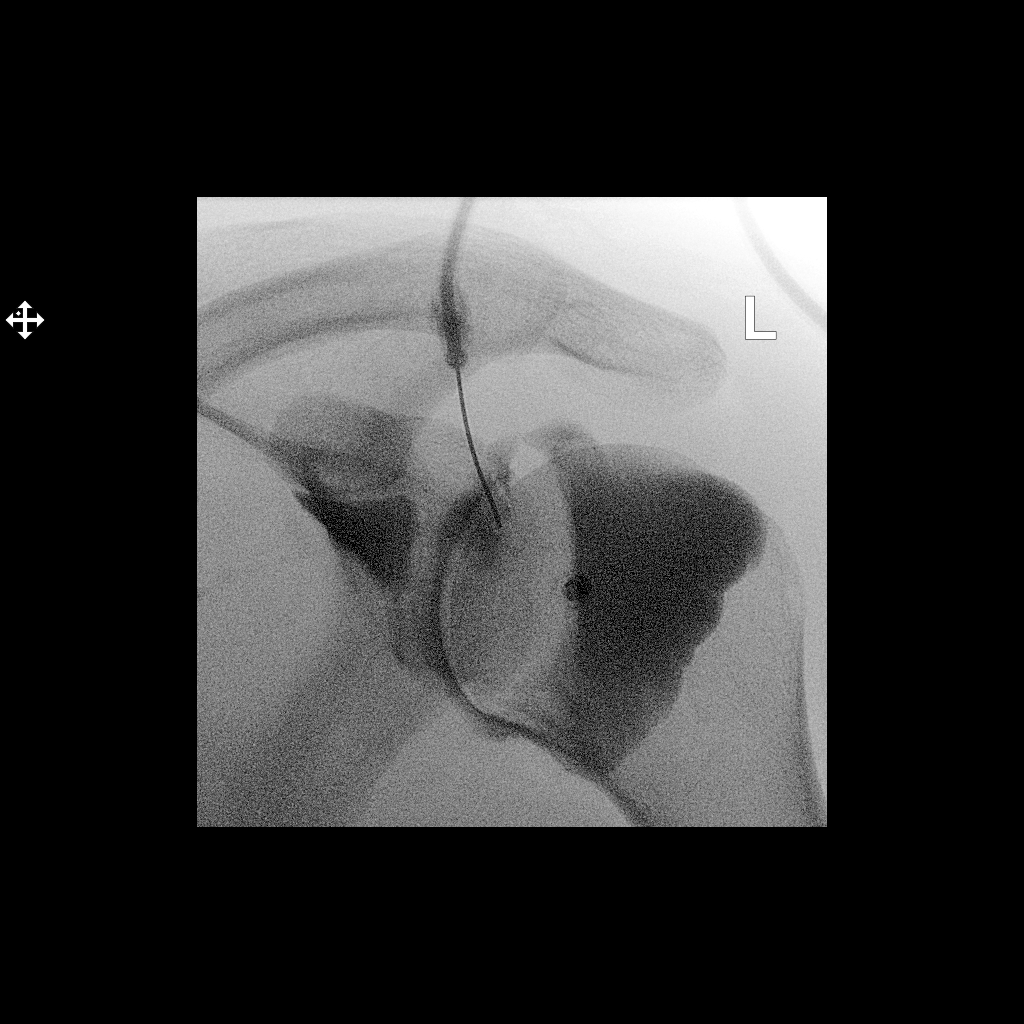

[2 of 2 positions shown; findings below may reference images not displayed]

IMPRESSION: Technically successful left shoulder injection for MRI.

## 2020-06-01 IMAGING — MR MR SHOULDER*L* W/ CM
4 of 6 series · 17 of 40 positions shown · IV contrast (agent unspecified)
Comparison: [DATE]

CLINICAL DATA: Left shoulder pain. History of prior surgery.
Re-injured [DATE]

EXAM:
MR ARTHROGRAM OF THE LEFT SHOULDER
TECHNIQUE: Multiplanar, multisequence MR imaging of the left shoulder was
performed following the administration of intra-articular contrast.
CONTRAST:  See Injection Documentation.

[Series 7: T1 fat-sat · axial · left · 3.0mm · 0.44mm/px · z∈[-38,+37]mm · 3 of 32 slices shown (1 of 2)]
[im 5/32]
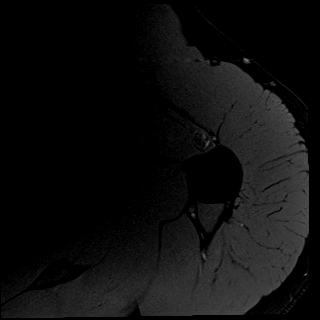
[im 18/32]
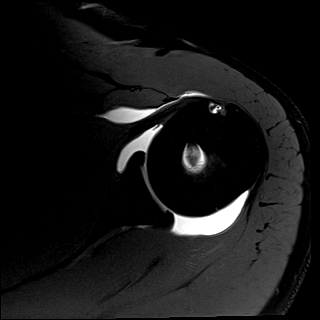
[im 27/32]
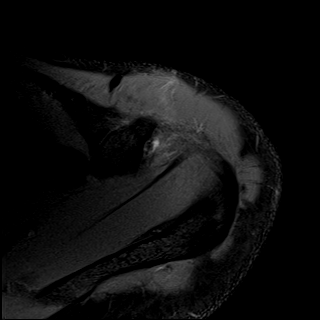

[Series 8: T1 fat-sat · oblique · left · 3.0mm · 0.22mm/px · 3 of 28 slices shown (2 of 2)]
[im 5/28]
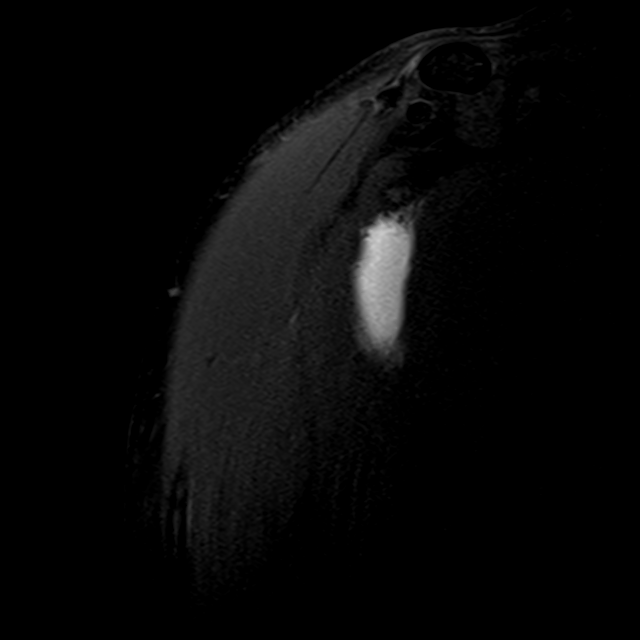
[im 14/28]
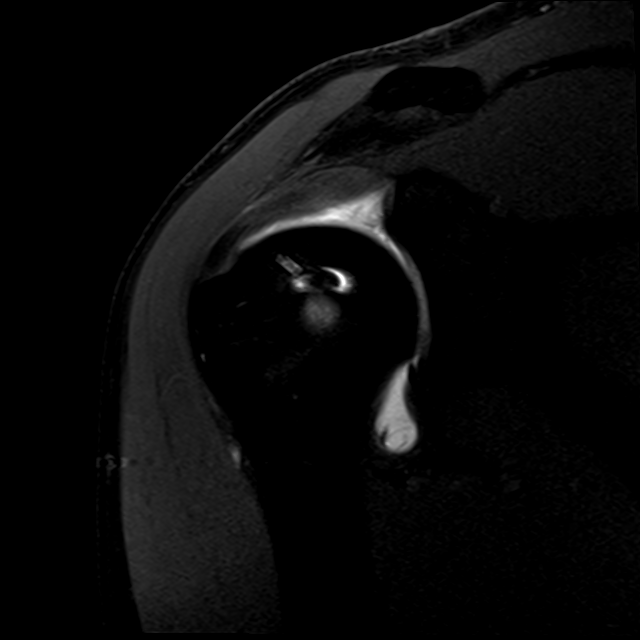
[im 23/28]
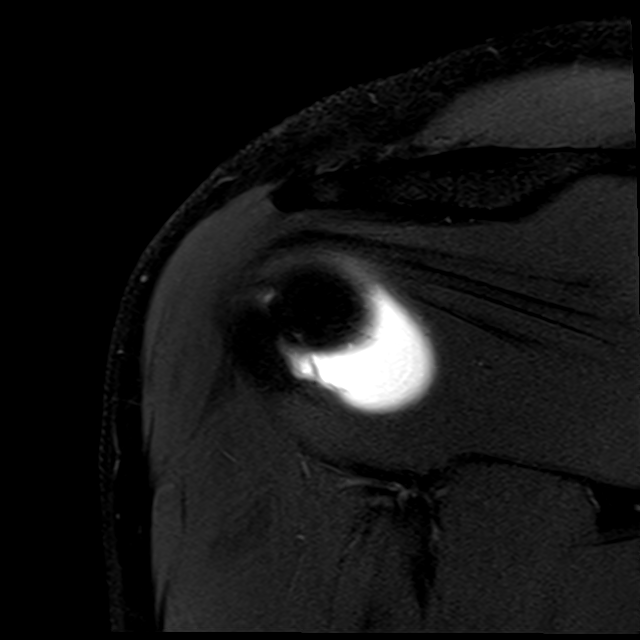

[Series 9: T2 fat-sat · oblique · left · 3.0mm · 0.27mm/px · 6 of 28 slices shown (1 of 2)]
[im 1/28]
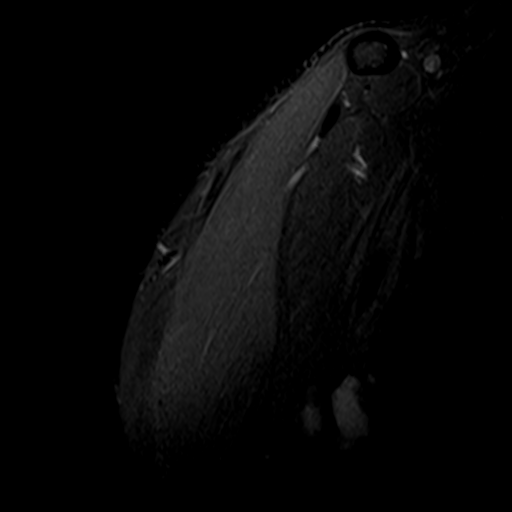
[im 6/28]
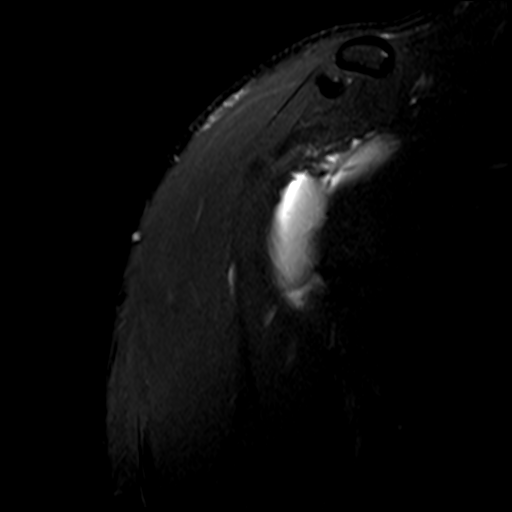
[im 11/28]
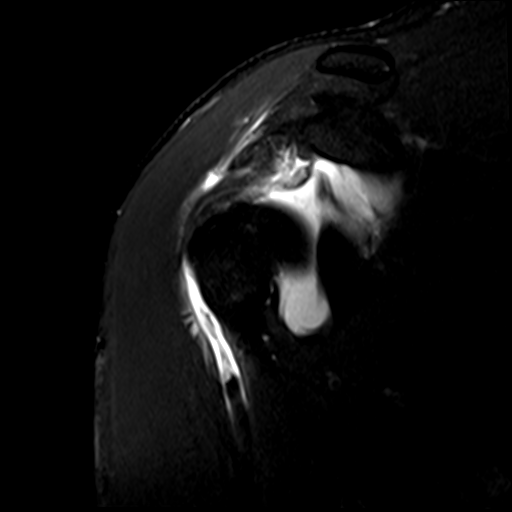
[im 17/28]
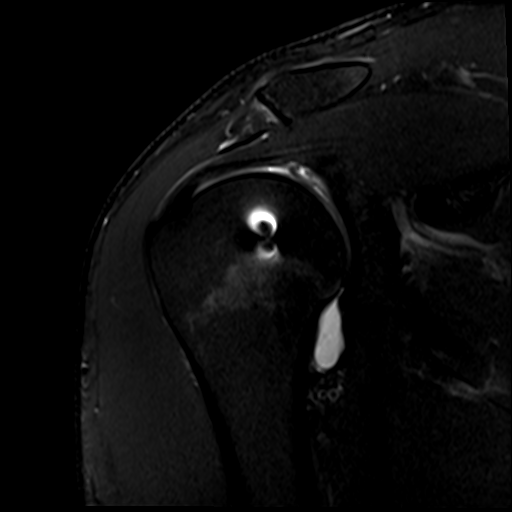
[im 22/28]
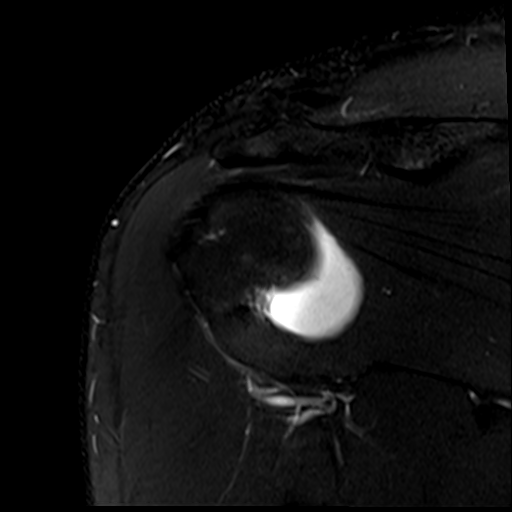
[im 28/28]
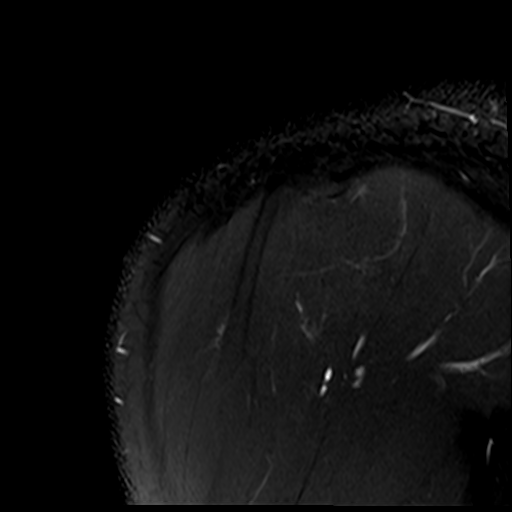

[Series 11: T2 fat-sat · oblique · left · 3.0mm · 0.22mm/px · 5 of 31 slices shown (2 of 2)]
[im 1/31]
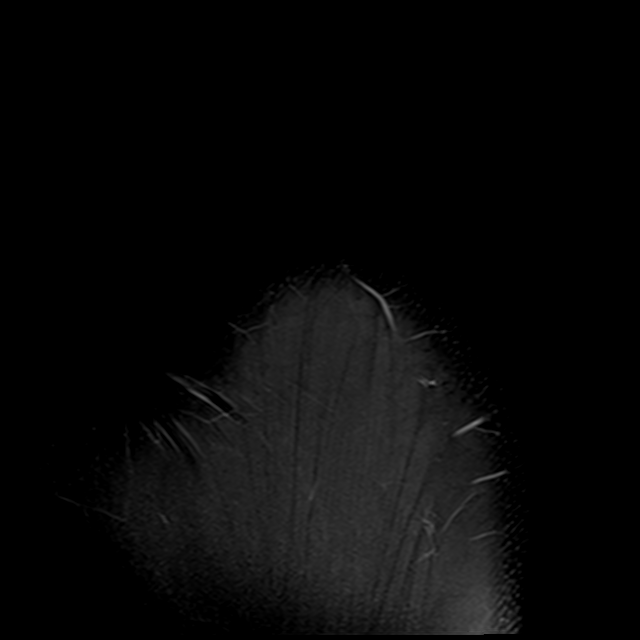
[im 6/31]
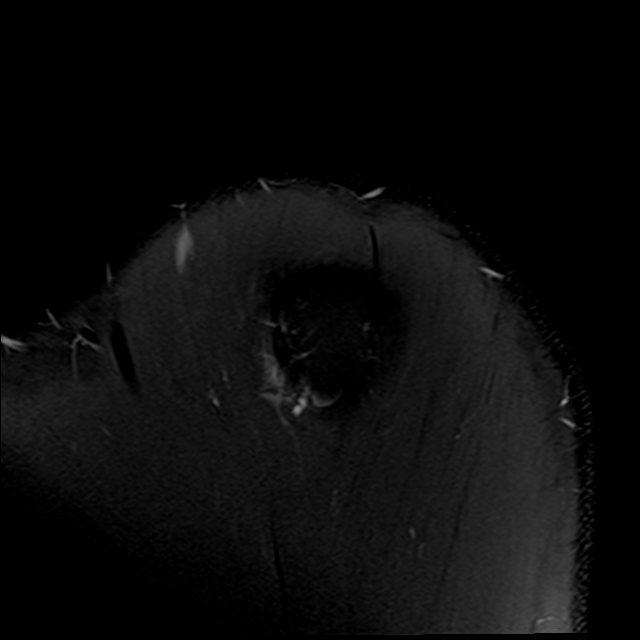
[im 11/31]
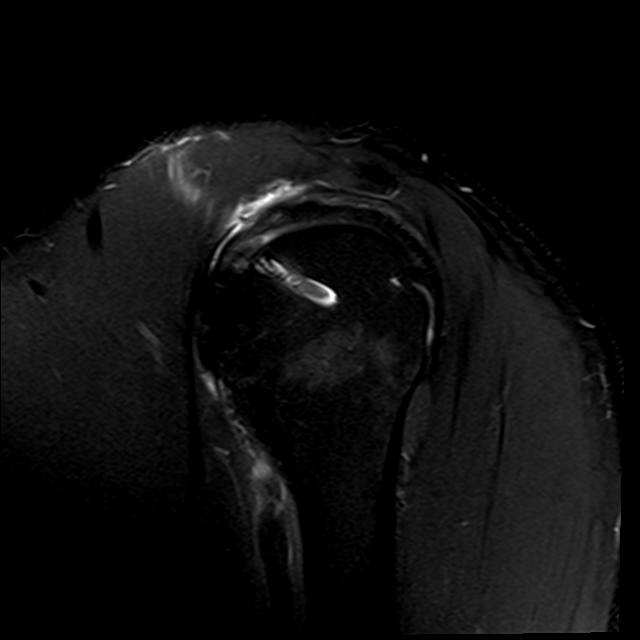
[im 16/31]
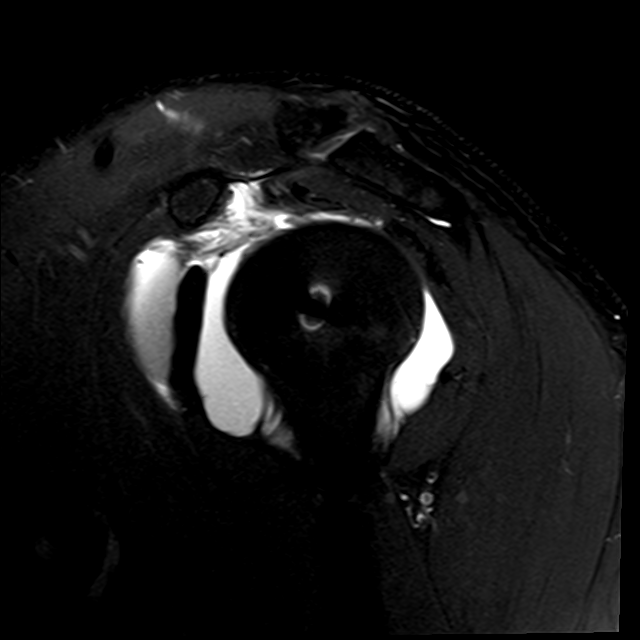
[im 26/31]
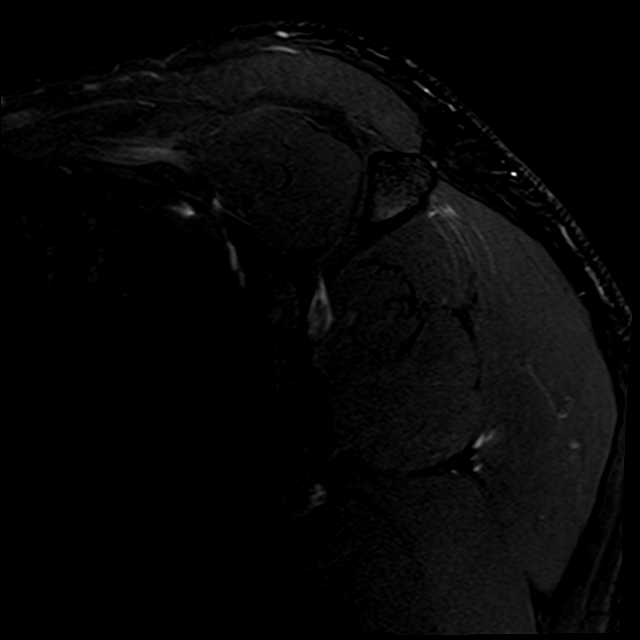

[17 of 40 positions shown; findings below may reference images not displayed]

FINDINGS: Rotator cuff: Supraspinatus tendon is intact. Infraspinatus tendon
is intact. Teres minor tendon is intact. Subscapularis tendon is
intact.

Muscles: No muscle atrophy or edema. No intramuscular fluid
collection or hematoma.

Biceps Long Head: Prior tenodesis. High-grade partial versus
complete tear of the extra-articular portion of the long head of the
biceps tendon.

Acromioclavicular Joint: Mild arthropathy of the acromioclavicular
joint. Type II acromion. No subacromial/subdeltoid bursal fluid.

Glenohumeral Joint: Intraarticular contrast distending the joint
capsule. Normal glenohumeral ligaments. No chondral defect.

Labrum: Irregularity of the superior labrum likely postsurgical.
Contrast extends into the residual superior labrum concerning for a
persistent versus recurrent superior labral tear.

Bones: No fracture or dislocation. No aggressive osseous lesion.

Other: No fluid collection or hematoma.
IMPRESSION: 1. Prior tenodesis. High-grade partial versus complete tear of the
extra-articular portion of the long head of the biceps tendon.
2. Irregularity of the superior labrum likely postsurgical. Contrast
extends into the residual superior labrum concerning for a
persistent versus recurrent superior labral tear.

## 2020-06-01 MED ORDER — IOPAMIDOL (ISOVUE-M 200) INJECTION 41%
13.0000 mL | Freq: Once | INTRAMUSCULAR | Status: AC
  Administered 2020-06-01: 13 mL via INTRA_ARTICULAR

## 2020-06-05 ENCOUNTER — Ambulatory Visit (INDEPENDENT_AMBULATORY_CARE_PROVIDER_SITE_OTHER): Payer: No Typology Code available for payment source | Admitting: Family Medicine

## 2020-06-05 ENCOUNTER — Other Ambulatory Visit: Payer: Self-pay

## 2020-06-05 VITALS — BP 138/88 | HR 91 | Ht 64.0 in | Wt 174.4 lb

## 2020-06-05 DIAGNOSIS — S46219A Strain of muscle, fascia and tendon of other parts of biceps, unspecified arm, initial encounter: Secondary | ICD-10-CM | POA: Diagnosis not present

## 2020-06-05 NOTE — Progress Notes (Signed)
Jimmy [name] is a 30 y.o. male who presents to [place] at [facility] today for f/u of L shoulder pain following L shoulder labral repair and biceps tenodesis w/ Dr. Erlinda Hong on 03/01/20.  He has had multiple falls and re-injuries since his surgery and has completed 4 PT sessions.  He was most recently seen by Dr. Marlou Sa on 05/10/20 for a 2nd opinion regarding his con't L shoulder pain, particularly at his L biceps. He was referred for a f/u L shoulder MRI arthrogram that he had on 06/01/20.  Since then, pt reports shoulder is the same as his last visit. Pt reports increased pain w/ any motion and notes a loss of shoulder ROM.  Patient notes that he was doing okay with his shoulder until he fell at work on January 4.  He suffered an injury at that point which started the reevaluation of his left shoulder pain.  He is currently out of work.  He has follow-up appointment with Dr. Erlinda Hong tomorrow to review the MRI and discuss surgical options.  Additionally he has a second opinion surgical follow-up at some point in the near future as well.  Diagnostic testing: L shoulder MRI arthrogram- 06/01/20 and 09/21/19; L shoulder and c-spine XR- 04/25/20;    Pertinent review of systems: No fevers or chills  Relevant historical information: History of opioid dependence   Exam:  BP 138/88 (BP Location: Right Arm, Patient Position: Sitting, Cuff Size: Normal)   Pulse 91   Ht 5\' 4"  (1.626 m)   Wt 174 lb 6.4 oz (79.1 kg)   SpO2 96%   BMI 29.94 kg/m  General: Well Developed, well nourished, and in no acute distress.   MSK: Left shoulder normal motion. Popeye arm deformity present. Decreased strength to supination and elbow flexion.     Lab and Radiology Results No results found for this or any previous visit (from the past 72 hour(s)). MR Shoulder Left w/ contrast  Result Date: 06/02/2020 CLINICAL DATA:  Left shoulder pain. History of prior surgery. Re-injured 03/01/2020 EXAM: MR  ARTHROGRAM OF THE LEFT SHOULDER TECHNIQUE: Multiplanar, multisequence MR imaging of the left shoulder was performed following the administration of intra-articular contrast. CONTRAST:  See Injection Documentation. COMPARISON:  09/21/2019 FINDINGS: Rotator cuff: Supraspinatus tendon is intact. Infraspinatus tendon is intact. Teres minor tendon is intact. Subscapularis tendon is intact. Muscles: No muscle atrophy or edema. No intramuscular fluid collection or hematoma. Biceps Long Head: Prior tenodesis. High-grade partial versus complete tear of the extra-articular portion of the long head of the biceps tendon. Acromioclavicular Joint: Mild arthropathy of the acromioclavicular joint. Type II acromion. No subacromial/subdeltoid bursal fluid. Glenohumeral Joint: Intraarticular contrast distending the joint capsule. Normal glenohumeral ligaments. No chondral defect. Labrum: Irregularity of the superior labrum likely postsurgical. Contrast extends into the residual superior labrum concerning for a persistent versus recurrent superior labral tear. Bones: No fracture or dislocation. No aggressive osseous lesion. Other: No fluid collection or hematoma. IMPRESSION: 1. Prior tenodesis. High-grade partial versus complete tear of the extra-articular portion of the long head of the biceps tendon. 2. Irregularity of the superior labrum likely postsurgical. Contrast extends into the residual superior labrum concerning for a persistent versus recurrent superior labral tear. Electronically Signed   By: Kathreen Devoid   On: 06/02/2020 09:56    I, Lynne Leader, personally (independently) visualized and performed the interpretation of the images attached in this note.    Assessment and Plan: 30 y.o. male with distal biceps tear  and biceps tendon tenodesis.  Tear very likely occurred during fall at work on January 4th.  Patient is bothered by his residual pain and weakness to elbow flexion and supination.  He has a surgical  follow-up scheduled tomorrow with Dr. Erlinda Hong to discuss his surgical options.  My understanding is that this is going to be a potentially challenging surgery.  I am not sure if surgery is worth it however that is definitely discussion that he will have with his surgeon and if needed second opinion surgery consultation.  Provided my opinion that surgery is probably can be challenging that most people do pretty well without surgery for this issue.  Happy to provide support for return to work when ready and provide letters if needed.  Recheck back with me as needed.  PDMP not reviewed this encounter. No orders of the defined types were placed in this encounter.  No orders of the defined types were placed in this encounter.    Discussed warning signs or symptoms. Please see discharge instructions. Patient expresses understanding.   Total encounter time 20 minutes including face-to-face time with the patient and, reviewing past medical record, and charting on the date of service.   Reviewed MRI and discuss treatment plan and options.

## 2020-06-05 NOTE — Patient Instructions (Signed)
Thank you for coming in today.  Follow up with orthopedic surgeon.   Recheck as needed.

## 2020-06-06 ENCOUNTER — Encounter: Payer: Self-pay | Admitting: Orthopaedic Surgery

## 2020-06-06 ENCOUNTER — Other Ambulatory Visit: Payer: Self-pay

## 2020-06-06 ENCOUNTER — Ambulatory Visit (INDEPENDENT_AMBULATORY_CARE_PROVIDER_SITE_OTHER): Payer: No Typology Code available for payment source | Admitting: Orthopaedic Surgery

## 2020-06-06 ENCOUNTER — Telehealth: Payer: Self-pay

## 2020-06-06 DIAGNOSIS — Z9889 Other specified postprocedural states: Secondary | ICD-10-CM

## 2020-06-06 NOTE — Telephone Encounter (Signed)
Pt called requesting an appt with Dr. Marlou Sa.  Pt seen Erlinda Hong today regarding his shoulder.  Please advise

## 2020-06-06 NOTE — Progress Notes (Signed)
Office Visit Note   Patient: Jimmy [name]           Date of Birth: 1990/06/27           MRN: [id number] Visit Date: 06/06/2020              Requested by: Charlott Rakes, MD [place],  [place] [id number] PCP: Charlott Rakes, MD   Assessment & Plan: Visit Diagnoses:  1. S/P arthroscopy of left shoulder     Plan: MRI was reviewed with the patient and I personally reviewed the MRI with Dr. Marlou Sa as well which shows failure of the tenodesis.  The proximal stump of the biceps tendon appears to be just superior to the pectoralis major insertion.  Given these findings and the patient's continued symptoms patient is elected to proceed with revision biceps tenodesis.  We spoke at length about the potential complications involving neurovascular injury or inability to retrieve the tendon or failure of pain relief or failure to correct Popeye deformity.  Conservative treatment was offered and recommended patient has decided that surgery is the direction he wants to go in and he is unable to work with this current pain and symptoms.  Questions encouraged and answered.  Follow-Up Instructions: Return if symptoms worsen or fail to improve.   Orders:  No orders of the defined types were placed in this encounter.  No orders of the defined types were placed in this encounter.     Procedures: No procedures performed   Clinical Data: No additional findings.   Subjective: Chief Complaint  Patient presents with  . Left Shoulder - Pain    [name] follows up today to review recent left shoulder MRI.  He continues to report a constant pain along the biceps muscle and down to the elbow.   Review of Systems  Constitutional: Negative.   All other systems reviewed and are negative.    Objective: Vital Signs: There were no vitals taken for this visit.  Physical Exam Vitals and nursing note reviewed.  Constitutional:      Appearance: He is well-developed.   Pulmonary:     Effort: Pulmonary effort is normal.  Abdominal:     Palpations: Abdomen is soft.  Skin:    General: Skin is warm.  Neurological:     Mental Status: He is alert and oriented to person, place, and time.  Psychiatric:        Behavior: Behavior normal.        Thought Content: Thought content normal.        Judgment: Judgment normal.     Ortho Exam Left arm exam shows abnormal contour of the biceps consistent with a Popeye deformity.  Rotator cuff strength intact.  Range of motion intact. Specialty Comments:  No specialty comments available.  Imaging: No results found.   PMFS History: Patient Active Problem List   Diagnosis Date Noted  . History of Abnormal LFTs (liver function tests) 12/30/2019  . Abnormal liver ultrasound 12/30/2019  . Fatty liver 12/30/2019  . Right flank pain 12/30/2019  . Abdominal wall pain 12/30/2019  . Gastroesophageal reflux disease without esophagitis 12/30/2019  . 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 Carpal tunnel syndrome 09/08/2019  . Bursitis of shoulder, right 08/31/2019  . Incisional hernia, without obstruction or gangrene 05/20/2018  . Opiate dependence (Jimmy [name]) 08/02/2013   Past Medical History:  Diagnosis Date  . ADHD (attention deficit hyperactivity disorder)   . Anxiety   .  Arthritis   . Cancer (Jimmy [name])    skin cancer  . Chest pain 08/2018  . GERD (gastroesophageal reflux disease)    no meds  . Migraine    migraines due to pinched nerve  . Pinched nerve in neck   . Scoliosis     Family History  Problem Relation Age of Onset  . Diabetes Mother   . Kidney cancer Mother   . Diabetes Maternal Grandmother   . Cancer Maternal Grandmother        unknown type. doing chemo  . Cancer Maternal Grandfather   . Diabetes Maternal Aunt   . Stomach cancer Neg Hx   . Pancreatic cancer Neg Hx   . Esophageal cancer Neg Hx   . Liver disease Neg Hx   . Colon cancer Neg Hx   . Rectal  cancer Neg Hx     Past Surgical History:  Procedure Laterality Date  . ESOPHAGUS SURGERY     as a baby  . SHOULDER ARTHROSCOPY WITH BICEPS TENDON REPAIR Left 03/01/2020   Procedure: LEFT SHOULDER ARTHROSCOPY WITH BICEPS TENODESIS;  Surgeon: Leandrew Koyanagi, MD;  Location: [facility];  Service: Orthopedics;  Laterality: Left;  . SHOULDER ARTHROSCOPY WITH SUBACROMIAL DECOMPRESSION Right 09/01/2019   Procedure: RIGHT SHOULDER ARTHROSCOPY WITH EXTENSIVE DEBRIDEMENT AND SUBACROMIAL DECOMPRESSION;  Surgeon: Leandrew Koyanagi, MD;  Location: [facility];  Service: Orthopedics;  Laterality: Right;  . VENTRAL HERNIA REPAIR N/A 08/31/2018   Procedure: HERNIA REPAIR VENTRAL ADULT;  Surgeon: Olean Ree, MD;  Location: [facility];  Service: General;  Laterality: N/A;   Social History   Occupational History  . Not on file  Tobacco Use  . Smoking status: Current Every Day Smoker    Packs/day: 1.00    Years: 15.00    Pack years: 15.00    Types: Cigarettes  . Smokeless tobacco: Former Network engineer  . Vaping Use: Former  Substance and Sexual Activity  . Alcohol use: Not Currently  . Drug use: Not Currently  . Sexual activity: Yes

## 2020-06-07 ENCOUNTER — Telehealth: Payer: Self-pay | Admitting: Orthopaedic Surgery

## 2020-06-07 ENCOUNTER — Other Ambulatory Visit: Payer: Self-pay | Admitting: Physician Assistant

## 2020-06-07 MED ORDER — ACETAMINOPHEN-CODEINE #3 300-30 MG PO TABS: 1.0000 | ORAL_TABLET | Freq: Four times a day (QID) | ORAL | 0 refills | Status: DC | PRN

## 2020-06-07 NOTE — Telephone Encounter (Signed)
Patient called requesting a refill of tylenol 3 been sent to his pharmacy on file. Patient phone number is [phone].

## 2020-06-07 NOTE — Telephone Encounter (Signed)
Sent in

## 2020-06-07 NOTE — Telephone Encounter (Signed)
Please make appt with Marlou Sa.

## 2020-06-07 NOTE — Telephone Encounter (Signed)
  See xu's msg. Thanks.

## 2020-06-07 NOTE — Telephone Encounter (Signed)
Please advise 

## 2020-06-09 ENCOUNTER — Encounter: Payer: Self-pay | Admitting: Orthopaedic Surgery

## 2020-06-12 ENCOUNTER — Encounter: Payer: Self-pay | Admitting: Family Medicine

## 2020-06-12 ENCOUNTER — Encounter: Payer: Self-pay | Admitting: Physician Assistant

## 2020-06-12 ENCOUNTER — Other Ambulatory Visit: Payer: Self-pay

## 2020-06-12 NOTE — Progress Notes (Signed)
   Follow-Up Visit   Subjective  Jimmy [name] is a 30 y.o. male who presents for the following: Acne (Patient here today for 31 day follow up).   The following portions of the chart were reviewed this encounter and updated as appropriate:  Tobacco  Allergies  Meds  Problems  Med Hx  Surg Hx  Fam Hx      Objective  Well appearing patient in no apparent distress; mood and affect are within normal limits.  All skin waist up examined .  Objective  Head - Anterior (Face), back, buttocks: Erythematous papules and pustules with comedones -  new boil on left hip -surgeon did bacterial culture yesterday on left buttock   Assessment & Plan  Acne vulgaris Head - Anterior (Face), back, buttocks  Continue current therapy no labs needed today.- depending on bacterial culture results - patient may be Humira new start for Hidradenitis Suppurativa  ISOtretinoin (ACCUTANE) 30 MG capsule - Head - Anterior (Face), back, buttocks  Encounter for therapeutic drug monitoring  Reordered Medications ISOtretinoin (ACCUTANE) 30 MG capsule    I, Shakiyah Cirilo, PA-C, have reviewed all documentation's for this visit.  The documentation on 06/12/20 for the exam, diagnosis, procedures and orders are all accurate and complete.

## 2020-06-13 ENCOUNTER — Encounter (HOSPITAL_BASED_OUTPATIENT_CLINIC_OR_DEPARTMENT_OTHER): Payer: Self-pay | Admitting: Orthopaedic Surgery

## 2020-06-13 ENCOUNTER — Other Ambulatory Visit: Payer: Self-pay

## 2020-06-19 ENCOUNTER — Other Ambulatory Visit (HOSPITAL_COMMUNITY): Payer: No Typology Code available for payment source

## 2020-06-19 NOTE — Progress Notes (Signed)
      Enhanced Recovery after Surgery for Orthopedics Enhanced Recovery after Surgery is a protocol used to improve the stress on your body and your recovery after surgery.  Patient Instructions  . The night before surgery:  o No food after midnight. ONLY clear liquids after midnight  . The day of surgery (if you do NOT have diabetes):  o Drink ONE (1) Pre-Surgery Clear Ensure as directed.   o This drink was given to you during your hospital  pre-op appointment visit. o The pre-op nurse will instruct you on the time to drink the  Pre-Surgery Ensure depending on your surgery time. o Finish the drink at the designated time by the pre-op nurse.  o Nothing else to drink after completing the  Pre-Surgery Clear Ensure.  . The day of surgery (if you have diabetes): o Drink ONE (1) Gatorade 2 (G2) as directed. o This drink was given to you during your hospital  pre-op appointment visit.  o The pre-op nurse will instruct you on the time to drink the   Gatorade 2 (G2) depending on your surgery time. o Color of the Gatorade may vary. Red is not allowed. o Nothing else to drink after completing the  Gatorade 2 (G2).         If you have questions, please contact your surgeon's office.  Given surgical soap and instructions. Verbalized understanding.

## 2020-06-21 ENCOUNTER — Encounter (HOSPITAL_BASED_OUTPATIENT_CLINIC_OR_DEPARTMENT_OTHER)
Admission: RE | Disposition: A | Discharge status: Home / Self Care | Payer: Self-pay | Source: Home / Self Care | Attending: Orthopaedic Surgery

## 2020-06-21 ENCOUNTER — Other Ambulatory Visit: Payer: Self-pay

## 2020-06-21 ENCOUNTER — Ambulatory Visit (HOSPITAL_BASED_OUTPATIENT_CLINIC_OR_DEPARTMENT_OTHER): Payer: No Typology Code available for payment source | Admitting: Anesthesiology

## 2020-06-21 ENCOUNTER — Ambulatory Visit (HOSPITAL_BASED_OUTPATIENT_CLINIC_OR_DEPARTMENT_OTHER)
Admission: RE | Admit: 2020-06-21 | Discharge: 2020-06-21 | Disposition: A | Discharge status: Home / Self Care | Payer: No Typology Code available for payment source | Attending: Orthopaedic Surgery | Admitting: Orthopaedic Surgery

## 2020-06-21 ENCOUNTER — Encounter (HOSPITAL_BASED_OUTPATIENT_CLINIC_OR_DEPARTMENT_OTHER): Payer: Self-pay | Admitting: Orthopaedic Surgery

## 2020-06-21 DIAGNOSIS — W19XXXA Unspecified fall, initial encounter: Secondary | ICD-10-CM | POA: Diagnosis not present

## 2020-06-21 DIAGNOSIS — S46212A Strain of muscle, fascia and tendon of other parts of biceps, left arm, initial encounter: Secondary | ICD-10-CM | POA: Diagnosis not present

## 2020-06-21 DIAGNOSIS — Z8616 Personal history of COVID-19: Secondary | ICD-10-CM | POA: Diagnosis not present

## 2020-06-21 DIAGNOSIS — Z9889 Other specified postprocedural states: Secondary | ICD-10-CM | POA: Insufficient documentation

## 2020-06-21 DIAGNOSIS — F17210 Nicotine dependence, cigarettes, uncomplicated: Secondary | ICD-10-CM | POA: Diagnosis not present

## 2020-06-21 DIAGNOSIS — Z885 Allergy status to narcotic agent status: Secondary | ICD-10-CM | POA: Diagnosis not present

## 2020-06-21 DIAGNOSIS — S43432A Superior glenoid labrum lesion of left shoulder, initial encounter: Secondary | ICD-10-CM | POA: Diagnosis not present

## 2020-06-21 DIAGNOSIS — Z791 Long term (current) use of non-steroidal anti-inflammatories (NSAID): Secondary | ICD-10-CM | POA: Insufficient documentation

## 2020-06-21 DIAGNOSIS — Z886 Allergy status to analgesic agent status: Secondary | ICD-10-CM | POA: Diagnosis not present

## 2020-06-21 DIAGNOSIS — Z79899 Other long term (current) drug therapy: Secondary | ICD-10-CM | POA: Insufficient documentation

## 2020-06-21 DIAGNOSIS — Z888 Allergy status to other drugs, medicaments and biological substances status: Secondary | ICD-10-CM | POA: Insufficient documentation

## 2020-06-21 DIAGNOSIS — Z85828 Personal history of other malignant neoplasm of skin: Secondary | ICD-10-CM | POA: Diagnosis not present

## 2020-06-21 HISTORY — PX: BICEPT TENODESIS: SHX5116

## 2020-06-21 HISTORY — PX: ORIF CLAVICULAR FRACTURE: SHX5055

## 2020-06-21 SURGERY — TENODESIS, BICEPS
Anesthesia: General | Site: Shoulder | Laterality: Left

## 2020-06-21 MED ORDER — LIDOCAINE 2% (20 MG/ML) 5 ML SYRINGE
INTRAMUSCULAR | Status: AC
  Filled 2020-06-21: qty 5

## 2020-06-21 MED ORDER — OXYCODONE-ACETAMINOPHEN 5-325 MG PO TABS: 1.0000 | ORAL_TABLET | Freq: Three times a day (TID) | ORAL | 0 refills | Status: DC | PRN

## 2020-06-21 MED ORDER — LACTATED RINGERS IV SOLN: INTRAVENOUS | Status: DC

## 2020-06-21 MED ORDER — FENTANYL CITRATE (PF) 100 MCG/2ML IJ SOLN
INTRAMUSCULAR | Status: AC
  Filled 2020-06-21: qty 2

## 2020-06-21 MED ORDER — PROPOFOL 10 MG/ML IV BOLUS
INTRAVENOUS | Status: AC
  Filled 2020-06-21: qty 20

## 2020-06-21 MED ORDER — EPHEDRINE 5 MG/ML INJ
INTRAVENOUS | Status: AC
  Filled 2020-06-21: qty 10

## 2020-06-21 MED ORDER — SUGAMMADEX SODIUM 500 MG/5ML IV SOLN
INTRAVENOUS | Status: DC | PRN
  Administered 2020-06-21: 158 mg via INTRAVENOUS

## 2020-06-21 MED ORDER — KETOROLAC TROMETHAMINE 30 MG/ML IJ SOLN
INTRAMUSCULAR | Status: AC
  Filled 2020-06-21: qty 1

## 2020-06-21 MED ORDER — ROCURONIUM BROMIDE 100 MG/10ML IV SOLN
INTRAVENOUS | Status: DC | PRN
  Administered 2020-06-21: 60 mg via INTRAVENOUS
  Administered 2020-06-21: 10 mg via INTRAVENOUS

## 2020-06-21 MED ORDER — PROPOFOL 10 MG/ML IV BOLUS
INTRAVENOUS | Status: DC | PRN
  Administered 2020-06-21: 100 mg via INTRAVENOUS
  Administered 2020-06-21: 20 mg via INTRAVENOUS
  Administered 2020-06-21 (×2): 50 mg via INTRAVENOUS
  Administered 2020-06-21: 20 mg via INTRAVENOUS
  Administered 2020-06-21: 10 mg via INTRAVENOUS

## 2020-06-21 MED ORDER — METHOCARBAMOL 750 MG PO TABS: 750.0000 mg | ORAL_TABLET | Freq: Two times a day (BID) | ORAL | 3 refills | Status: DC | PRN

## 2020-06-21 MED ORDER — ACETAMINOPHEN 500 MG PO TABS
1000.0000 mg | ORAL_TABLET | Freq: Once | ORAL | Status: AC
  Administered 2020-06-21: 1000 mg via ORAL

## 2020-06-21 MED ORDER — GABAPENTIN 300 MG PO CAPS
300.0000 mg | ORAL_CAPSULE | Freq: Once | ORAL | Status: AC
  Administered 2020-06-21: 300 mg via ORAL

## 2020-06-21 MED ORDER — PHENYLEPHRINE HCL (PRESSORS) 10 MG/ML IV SOLN
INTRAVENOUS | Status: DC | PRN
  Administered 2020-06-21: 80 ug via INTRAVENOUS
  Administered 2020-06-21 (×3): 40 ug via INTRAVENOUS
  Administered 2020-06-21 (×3): 80 ug via INTRAVENOUS
  Administered 2020-06-21: 40 ug via INTRAVENOUS
  Administered 2020-06-21 (×4): 80 ug via INTRAVENOUS

## 2020-06-21 MED ORDER — CEFAZOLIN SODIUM-DEXTROSE 2-4 GM/100ML-% IV SOLN
2.0000 g | INTRAVENOUS | Status: AC
  Administered 2020-06-21: 2 g via INTRAVENOUS

## 2020-06-21 MED ORDER — PHENYLEPHRINE 40 MCG/ML (10ML) SYRINGE FOR IV PUSH (FOR BLOOD PRESSURE SUPPORT)
PREFILLED_SYRINGE | INTRAVENOUS | Status: AC
  Filled 2020-06-21: qty 10

## 2020-06-21 MED ORDER — MIDAZOLAM HCL 2 MG/2ML IJ SOLN
INTRAMUSCULAR | Status: AC
  Filled 2020-06-21: qty 2

## 2020-06-21 MED ORDER — PROMETHAZINE HCL 25 MG/ML IJ SOLN: 6.2500 mg | INTRAMUSCULAR | Status: DC | PRN

## 2020-06-21 MED ORDER — ROPIVACAINE HCL 7.5 MG/ML IJ SOLN
INTRAMUSCULAR | Status: DC | PRN
  Administered 2020-06-21: 20 mL via PERINEURAL

## 2020-06-21 MED ORDER — FENTANYL CITRATE (PF) 100 MCG/2ML IJ SOLN: 25.0000 ug | INTRAMUSCULAR | Status: DC | PRN

## 2020-06-21 MED ORDER — DEXAMETHASONE SODIUM PHOSPHATE 10 MG/ML IJ SOLN
INTRAMUSCULAR | Status: DC | PRN
  Administered 2020-06-21: 10 mg

## 2020-06-21 MED ORDER — ONDANSETRON HCL 4 MG/2ML IJ SOLN
INTRAMUSCULAR | Status: DC | PRN
  Administered 2020-06-21: 4 mg via INTRAVENOUS

## 2020-06-21 MED ORDER — DEXAMETHASONE SODIUM PHOSPHATE 10 MG/ML IJ SOLN
INTRAMUSCULAR | Status: AC
  Filled 2020-06-21: qty 1

## 2020-06-21 MED ORDER — CEFAZOLIN SODIUM-DEXTROSE 2-4 GM/100ML-% IV SOLN
INTRAVENOUS | Status: AC
  Filled 2020-06-21: qty 100

## 2020-06-21 MED ORDER — GABAPENTIN 300 MG PO CAPS
ORAL_CAPSULE | ORAL | Status: AC
  Filled 2020-06-21: qty 1

## 2020-06-21 MED ORDER — ONDANSETRON HCL 4 MG/2ML IJ SOLN
INTRAMUSCULAR | Status: AC
  Filled 2020-06-21: qty 2

## 2020-06-21 MED ORDER — FENTANYL CITRATE (PF) 100 MCG/2ML IJ SOLN
100.0000 ug | Freq: Once | INTRAMUSCULAR | Status: AC
Start: 2020-06-21 — End: 2020-06-21
  Administered 2020-06-21: 50 ug via INTRAVENOUS

## 2020-06-21 MED ORDER — ACETAMINOPHEN 500 MG PO TABS
ORAL_TABLET | ORAL | Status: AC
  Filled 2020-06-21: qty 2

## 2020-06-21 MED ORDER — SUGAMMADEX SODIUM 500 MG/5ML IV SOLN
INTRAVENOUS | Status: AC
  Filled 2020-06-21: qty 5

## 2020-06-21 MED ORDER — LIDOCAINE HCL (CARDIAC) PF 100 MG/5ML IV SOSY
PREFILLED_SYRINGE | INTRAVENOUS | Status: DC | PRN
  Administered 2020-06-21: 60 mg via INTRAVENOUS

## 2020-06-21 MED ORDER — MIDAZOLAM HCL 2 MG/2ML IJ SOLN
2.0000 mg | Freq: Once | INTRAMUSCULAR | Status: AC
  Administered 2020-06-21: 2 mg via INTRAVENOUS

## 2020-06-21 MED ORDER — KETOROLAC TROMETHAMINE 30 MG/ML IJ SOLN
INTRAMUSCULAR | Status: DC | PRN
  Administered 2020-06-21: 30 mg via INTRAVENOUS

## 2020-06-21 MED ORDER — DEXAMETHASONE SODIUM PHOSPHATE 4 MG/ML IJ SOLN
INTRAMUSCULAR | Status: DC | PRN
  Administered 2020-06-21: 10 mg via INTRAVENOUS

## 2020-06-21 MED ORDER — EPHEDRINE SULFATE 50 MG/ML IJ SOLN
INTRAMUSCULAR | Status: DC | PRN
  Administered 2020-06-21: 10 mg via INTRAVENOUS

## 2020-06-21 MED ORDER — OXYCODONE HCL 5 MG PO TABS
ORAL_TABLET | ORAL | Status: AC
  Filled 2020-06-21: qty 1

## 2020-06-21 MED ORDER — OXYCODONE HCL 5 MG PO TABS
5.0000 mg | ORAL_TABLET | Freq: Once | ORAL | Status: AC
  Administered 2020-06-21: 5 mg via ORAL

## 2020-06-21 MED ORDER — FENTANYL CITRATE (PF) 100 MCG/2ML IJ SOLN
INTRAMUSCULAR | Status: DC | PRN
  Administered 2020-06-21: 25 ug via INTRAVENOUS
  Administered 2020-06-21: 50 ug via INTRAVENOUS
  Administered 2020-06-21: 25 ug via INTRAVENOUS
  Administered 2020-06-21 (×2): 50 ug via INTRAVENOUS

## 2020-06-21 MED ORDER — ROCURONIUM BROMIDE 10 MG/ML (PF) SYRINGE
PREFILLED_SYRINGE | INTRAVENOUS | Status: AC
  Filled 2020-06-21: qty 10

## 2020-06-21 SURGICAL SUPPLY — 55 items
ANCH SUT SWLK 24.5 SLF PNCH VT (Anchor) ×2 IMPLANT
ANCHOR BIOCOMP SWIVELOCK (Anchor) ×3 IMPLANT
APL SKNCLS STERI-STRIP NONHPOA (GAUZE/BANDAGES/DRESSINGS)
BENZOIN TINCTURE PRP APPL 2/3 (GAUZE/BANDAGES/DRESSINGS) IMPLANT
BLADE SURG 15 STRL LF DISP TIS (BLADE) ×4 IMPLANT
BLADE SURG 15 STRL SS (BLADE) ×8
CLOSURE WOUND 1/2 X4 (GAUZE/BANDAGES/DRESSINGS)
COVER WAND RF STERILE (DRAPES) IMPLANT
DRAPE C-ARM 42X72 X-RAY (DRAPES) ×4 IMPLANT
DRAPE IMP U-DRAPE 54X76 (DRAPES) ×4 IMPLANT
DRAPE INCISE IOBAN 66X45 STRL (DRAPES) ×4 IMPLANT
DRAPE U-SHAPE 47X51 STRL (DRAPES) ×8 IMPLANT
DRAPE U-SHAPE 76X120 STRL (DRAPES) ×8 IMPLANT
DRSG MEPILEX BORDER 4X8 (GAUZE/BANDAGES/DRESSINGS) IMPLANT
DRSG PAD ABDOMINAL 8X10 ST (GAUZE/BANDAGES/DRESSINGS) ×4 IMPLANT
DURAPREP 26ML APPLICATOR (WOUND CARE) ×4 IMPLANT
ELECT REM PT RETURN 9FT ADLT (ELECTROSURGICAL) ×4
ELECTRODE REM PT RTRN 9FT ADLT (ELECTROSURGICAL) ×2 IMPLANT
GAUZE SPONGE 4X4 12PLY STRL (GAUZE/BANDAGES/DRESSINGS) ×4 IMPLANT
GLOVE SURG LTX SZ7 (GLOVE) ×10 IMPLANT
GLOVE SURG NEOP MICRO LF SZ7.5 (GLOVE) ×4 IMPLANT
GLOVE SURG SYN 7.5  E (GLOVE) ×4
GLOVE SURG SYN 7.5 E (GLOVE) ×2 IMPLANT
GLOVE SURG SYN 7.5 PF PI (GLOVE) ×1 IMPLANT
GLOVE SURG UNDER POLY LF SZ7 (GLOVE) ×4 IMPLANT
GOWN STRL REIN XL XLG (GOWN DISPOSABLE) ×4 IMPLANT
GOWN STRL REUS W/ TWL LRG LVL3 (GOWN DISPOSABLE) ×2 IMPLANT
GOWN STRL REUS W/ TWL XL LVL3 (GOWN DISPOSABLE) ×2 IMPLANT
GOWN STRL REUS W/TWL LRG LVL3 (GOWN DISPOSABLE) ×4
GOWN STRL REUS W/TWL XL LVL3 (GOWN DISPOSABLE) ×11 IMPLANT
IMP SYS 2ND FIX PEEK 4.75X19.1 (Miscellaneous) ×4 IMPLANT
IMPL SYS 2ND FX PEEK 4.75X19.1 (Miscellaneous) ×1 IMPLANT
MANIFOLD NEPTUNE II (INSTRUMENTS) IMPLANT
NDL SUT 6 .5 CRC .975X.05 MAYO (NEEDLE) ×1 IMPLANT
NEEDLE MAYO TAPER (NEEDLE) ×4
PACK ARTHROSCOPY DSU (CUSTOM PROCEDURE TRAY) ×4 IMPLANT
PACK BASIN DAY SURGERY FS (CUSTOM PROCEDURE TRAY) ×4 IMPLANT
PENCIL SMOKE EVACUATOR (MISCELLANEOUS) ×4 IMPLANT
SHEET MEDIUM DRAPE 40X70 STRL (DRAPES) ×4 IMPLANT
SLEEVE SCD COMPRESS KNEE MED (STOCKING) ×4 IMPLANT
SLING ARM FOAM STRAP LRG (SOFTGOODS) IMPLANT
SPONGE LAP 18X18 RF (DISPOSABLE) ×8 IMPLANT
STRIP CLOSURE SKIN 1/2X4 (GAUZE/BANDAGES/DRESSINGS) IMPLANT
SUCTION FRAZIER HANDLE 10FR (MISCELLANEOUS) ×4
SUCTION TUBE FRAZIER 10FR DISP (MISCELLANEOUS) ×2 IMPLANT
SUT FIBERWIRE #2 38 T-5 BLUE (SUTURE) ×4
SUT MNCRL AB 4-0 PS2 18 (SUTURE) ×4 IMPLANT
SUT VIC AB 0 CT1 27 (SUTURE) ×4
SUT VIC AB 0 CT1 27XBRD ANBCTR (SUTURE) ×2 IMPLANT
SUT VIC AB 2-0 CT1 27 (SUTURE) ×4
SUT VIC AB 2-0 CT1 TAPERPNT 27 (SUTURE) ×2 IMPLANT
SUTURE FIBERWR #2 38 T-5 BLUE (SUTURE) ×1 IMPLANT
SYR BULB EAR ULCER 3OZ GRN STR (SYRINGE) ×4 IMPLANT
TOWEL GREEN STERILE FF (TOWEL DISPOSABLE) ×4 IMPLANT
YANKAUER SUCT BULB TIP NO VENT (SUCTIONS) ×4 IMPLANT

## 2020-06-21 NOTE — Op Note (Addendum)
Date of Surgery: 06/21/2020  INDICATIONS: Jimmy [name] is a 30 y.o.-year-old male who underwent arthroscopic left proximal biceps tenodesis approximately 4 months ago who unfortunately sustained several falls postoperatively and ruptured the previous tenodesis.  He elected to have revision biceps tenodesis after failure of conservative management.  The patient did consent to the procedure after discussion of the risks and benefits.  PREOPERATIVE DIAGNOSIS:  1.  Rupture of previous left proximal biceps tenodesis 2.  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  PROCEDURE:  1.  Tenolysis of long head of left biceps 2.  Open tenodesis of long head of left biceps 3.  Removal of deep implant (sutures) from prior biceps tenodesis  SURGEON: Jimmy [name], M.D.  ASSIST: Jimmy [name], [place]; necessary for the timely completion of procedure and due to complexity of procedure.  ANESTHESIA:  general, regional block  IV FLUIDS AND URINE: See anesthesia.  ESTIMATED BLOOD LOSS: Minimal mL.  IMPLANTS: Arthrex 4.75 mm swivel lock  DRAINS: None  COMPLICATIONS: see description of procedure.  DESCRIPTION OF PROCEDURE: The patient was brought to the operating room.  The patient had been signed prior to the procedure and this was documented. The patient had the anesthesia placed by the anesthesiologist.  A time-out was performed to confirm that this was the correct patient, site, side and location. The patient did receive antibiotics prior to the incision and was re-dosed during the procedure as needed at indicated intervals.  The patient had the operative extremity prepped and draped in the standard surgical fashion.    A deltopectoral incision was made from the coracoid towards the proximal humeral shaft.  The deltopectoral interval was developed and the cephalic vein was identified and retracted laterally.  Finger dissection was used to develop the subdeltoid space  and retractors were placed over the humeral head to retract the deltoid laterally.  The pectoralis muscle belly was retracted medially.  The bicipital groove was then identified using finger palpation.  Gentle and careful dissection into the bicipital groove just superior to the superior edge of the pectoralis major tendon revealed the long head of the biceps tendon which had ruptured from the previous tenodesis site.  The sutures were identified within the tendon and the sutures were removed.  The bicipital sling was incised.  Tenolysis was first performed of the biceps tendon in order to mobilize it.  Once tenolysis was complete we then whipstitched the biceps tendon in order to pull and retension the biceps tendon.  A second whipstitch was used distal to the first whipstitch.  After we determine the appropriate tension we marked the spot in the bicipital groove in which we plan to place the swivel lock.  The provided drill was first used and then tapped and then the biceps tendon was pulled proximally to the appropriate tension which we felt restored the muscular contour of the arm.  Under appropriate tension we then tenodesed the tendon into the bicipital groove using a 4.75 mm swivel lock which gave excellent purchase.  The sutures attached to swivel lock were then used to supplement the tenodesis by running Krakw sutures.  I then decided to reinforce the tenodesis further by tenodesing it to the pectoralis major tendon with a #2 FiberWire.  Range of motion of the elbow and the shoulder showed a solid tenodesis without any movement of the tendon.  The wound was then thoroughly irrigated and closed in layered fashion.  Sterile dressings were applied.  Shoulder  sling was placed.  Patient tolerated procedure well and had no immediate complications.  Jimmy [name] was necessary for opening, closing, retracting, limb positioning and overall facilitation and timely completion of the procedure.  POSTOPERATIVE  PLAN: Patient will wear a sling for 6 weeks.  He is to avoid any lifting more than a couple pounds for 6 weeks.  He will follow-up in the office in 2 weeks for wound check.  Jimmy Cecil, MD 2:52 PM

## 2020-06-21 NOTE — Discharge Instructions (Signed)
  Post Anesthesia Home Care Instructions  Activity: Get plenty of rest for the remainder of the day. A responsible individual must stay with you for 24 hours following the procedure.  For the next 24 hours, DO NOT: -Drive a car -Paediatric nurse -Drink alcoholic beverages -Take any medication unless instructed by your physician -Make any legal decisions or sign important papers.  Meals: Start with liquid foods such as gelatin or soup. Progress to regular foods as tolerated. Avoid greasy, spicy, heavy foods. If nausea and/or vomiting occur, drink only clear liquids until the nausea and/or vomiting subsides. Call your physician if vomiting continues.  Special Instructions/Symptoms: Your throat may feel dry or sore from the anesthesia or the breathing tube placed in your throat during surgery. If this causes discomfort, gargle with warm salt water. The discomfort should disappear within 24 hours.  If you had a scopolamine patch placed behind your ear for the management of post- operative nausea and/or vomiting:  1. The medication in the patch is effective for 72 hours, after which it should be removed.  Wrap patch in a tissue and discard in the trash. Wash hands thoroughly with soap and water. 2. You may remove the patch earlier than 72 hours if you experience unpleasant side effects which may include dry mouth, dizziness or visual disturbances. 3. Avoid touching the patch. Wash your hands with soap and water after contact with the patch.    Regional Anesthesia Blocks  1. Numbness or the inability to move the "blocked" extremity may last from 3-48 hours after placement. The length of time depends on the medication injected and your individual response to the medication. If the numbness is not going away after 48 hours, call your surgeon.  2. The extremity that is blocked will need to be protected until the numbness is gone and the  Strength has returned. Because you cannot feel it, you will  need to take extra care to avoid injury. Because it may be weak, you may have difficulty moving it or using it. You may not know what position it is in without looking at it while the block is in effect.  3. For blocks in the legs and feet, returning to weight bearing and walking needs to be done carefully. You will need to wait until the numbness is entirely gone and the strength has returned. You should be able to move your leg and foot normally before you try and bear weight or walk. You will need someone to be with you when you first try to ensure you do not fall and possibly risk injury.  4. Bruising and tenderness at the needle site are common side effects and will resolve in a few days.  5. Persistent numbness or new problems with movement should be communicated to the surgeon or the [place] [phone] [place] [phone]).   No Tylenol until 5:30PM.

## 2020-06-21 NOTE — Anesthesia Preprocedure Evaluation (Signed)
Anesthesia Evaluation  Patient identified by MRN, date of birth, ID band Patient awake    Reviewed: Allergy & Precautions, NPO status , Patient's Chart, lab work & pertinent test results  Airway Mallampati: II  TM Distance: >3 FB Neck ROM: Full    Dental  (+) Dental Advisory Given, Chipped   Pulmonary Current Smoker,    Pulmonary exam normal breath sounds clear to auscultation       Cardiovascular negative cardio ROS Normal cardiovascular exam Rhythm:Regular Rate:Normal     Neuro/Psych  Headaches, PSYCHIATRIC DISORDERS Anxiety    GI/Hepatic GERD  ,(+)     substance abuse  ,   Endo/Other  negative endocrine ROS  Renal/GU negative Renal ROS     Musculoskeletal  (+) Arthritis , left biceps tear   Abdominal   Peds  (+) ADHD Hematology negative hematology ROS (+)   Anesthesia Other Findings Day of surgery medications reviewed with the patient.  Reproductive/Obstetrics                             Anesthesia Physical Anesthesia Plan  ASA: II  Anesthesia Plan: General   Post-op Pain Management:  Regional for Post-op pain   Induction: Intravenous  PONV Risk Score and Plan: 2 and Midazolam, Dexamethasone and Ondansetron  Airway Management Planned: Oral ETT  Additional Equipment:   Intra-op Plan:   Post-operative Plan: Extubation in OR  Informed Consent: I have reviewed the patients History and Physical, chart, labs and discussed the procedure including the risks, benefits and alternatives for the proposed anesthesia with the patient or authorized representative who has indicated his/her understanding and acceptance.     Dental advisory given  Plan Discussed with: CRNA  Anesthesia Plan Comments:         Anesthesia Quick Evaluation

## 2020-06-21 NOTE — Anesthesia Postprocedure Evaluation (Signed)
Anesthesia Post Note  Patient: Jimmy [name]  Procedure(s) Performed: LEFT OPEN BICEPS TENODESIS, TENOLYSIS (Left Shoulder)     Patient location during evaluation: PACU Anesthesia Type: General Level of consciousness: awake and alert Pain management: pain level controlled Vital Signs Assessment: post-procedure vital signs reviewed and stable Respiratory status: spontaneous breathing, nonlabored ventilation, respiratory function stable and patient connected to nasal cannula oxygen Cardiovascular status: blood pressure returned to baseline and stable Postop Assessment: no apparent nausea or vomiting Anesthetic complications: no   No complications documented.  Last Vitals:  Vitals:   06/21/20 1530 06/21/20 1602  BP: 115/64 132/72  Pulse: 90 86  Resp: 20 16  Temp:  36.8 C  SpO2: 100% 97%    Last Pain:  Vitals:   06/21/20 1602  TempSrc:   PainSc: [place]

## 2020-06-21 NOTE — Anesthesia Procedure Notes (Addendum)
Anesthesia Regional Block: Supraclavicular block   Pre-Anesthetic Checklist: ,, timeout performed, Correct Patient, Correct Site, Correct Laterality, Correct Procedure, Correct Position, site marked, Risks and benefits discussed,  Surgical consent,  Pre-op evaluation,  At surgeon's request and post-op pain management  Laterality: Left  Prep: chloraprep       Needles:  Injection technique: Single-shot  Needle Type: Echogenic Needle     Needle Length: 9cm  Needle Gauge: 21     Additional Needles:   Procedures:,,,, ultrasound used (permanent image in chart),,,,  Narrative:  Start time: 06/21/2020 11:56 AM End time: 06/21/2020 12:04 PM Injection made incrementally with aspirations every 5 mL.  Performed by: Personally  Anesthesiologist: Catalina Gravel, MD  Additional Notes: No pain on injection. No increased resistance to injection. Injection made in 5cc increments.  Good needle visualization.  Patient tolerated procedure well.

## 2020-06-21 NOTE — Transfer of Care (Signed)
Immediate Anesthesia Transfer of Care Note  Patient: Jimmy [name]  Procedure(s) Performed: LEFT OPEN BICEPS TENODESIS, TENOLYSIS (Left )  Patient Location: PACU  Anesthesia Type:General and Regional  Level of Consciousness: drowsy  Airway & Oxygen Therapy: Patient Spontanous Breathing and Patient connected to face mask oxygen  Post-op Assessment: Report given to RN and Post -op Vital signs reviewed and stable  Post vital signs: Reviewed and stable  Last Vitals:  Vitals Value Taken Time  BP 112/62 06/21/20 1523  Temp    Pulse 87 06/21/20 1524  Resp 15 06/21/20 1524  SpO2 100 % 06/21/20 1524  Vitals shown include unvalidated device data.  Last Pain:  Vitals:   06/21/20 1128  TempSrc: Oral  PainSc: 8       Patients Stated Pain Goal: 3 (40/37/09 6438)  Complications: No complications documented.

## 2020-06-21 NOTE — Progress Notes (Signed)
Assisted Dr. Turk with left, ultrasound guided, supraclavicular block. Side rails up, monitors on throughout procedure. See vital signs in flow sheet. Tolerated Procedure well. 

## 2020-06-21 NOTE — H&P (Signed)
PREOPERATIVE H&P  Chief Complaint: left biceps tear  HPI: Jimmy [name] is a 29 y.o. male who presents for surgical treatment of left biceps tear.  He denies any changes in medical history.  Past Medical History:  Diagnosis Date  . ADHD (attention deficit hyperactivity disorder)   . Anxiety   . Arthritis   . Cancer ([place])    skin cancer  . Chest pain 08/2018  . GERD (gastroesophageal reflux disease)    no meds  . History of COVID-19 04/05/2020  . Migraine    migraines due to pinched nerve  . Pinched nerve in neck   . Scoliosis    Past Surgical History:  Procedure Laterality Date  . ESOPHAGUS SURGERY     as a baby  . SHOULDER ARTHROSCOPY WITH BICEPS TENDON REPAIR Left 03/01/2020   Procedure: LEFT SHOULDER ARTHROSCOPY WITH BICEPS TENODESIS;  Surgeon: Leandrew Koyanagi, MD;  Location: [facility];  Service: Orthopedics;  Laterality: Left;  . SHOULDER ARTHROSCOPY WITH SUBACROMIAL DECOMPRESSION Right 09/01/2019   Procedure: RIGHT SHOULDER ARTHROSCOPY WITH EXTENSIVE DEBRIDEMENT AND SUBACROMIAL DECOMPRESSION;  Surgeon: Leandrew Koyanagi, MD;  Location: [facility];  Service: Orthopedics;  Laterality: Right;  . VENTRAL HERNIA REPAIR N/A 08/31/2018   Procedure: HERNIA REPAIR VENTRAL ADULT;  Surgeon: Olean Ree, MD;  Location: [facility];  Service: General;  Laterality: N/A;   Social History   Socioeconomic History  . Marital status: Married    Spouse name: Not on file  . Number of children: Not on file  . Years of education: Not on file  . Highest education level: Not on file  Occupational History  . Not on file  Tobacco Use  . Smoking status: Current Every Day Smoker    Packs/day: 1.00    Years: 15.00    Pack years: 15.00    Types: Cigarettes  . Smokeless tobacco: Former Network engineer  . Vaping Use: Former  Substance and Sexual Activity  . Alcohol use: Not Currently  . Drug use: Not Currently  . Sexual activity: Yes  Other Topics Concern   . Not on file  Social History Narrative   Right handed   One story home    Drinks caffeine    Social Determinants of Health   Financial Resource Strain: Not on file  Food Insecurity: Not on file  Transportation Needs: Not on file  Physical Activity: Not on file  Stress: Not on file  Social Connections: Not on file   Family History  Problem Relation Age of Onset  . Diabetes Mother   . Kidney cancer Mother   . Diabetes Maternal Grandmother   . Cancer Maternal Grandmother        unknown type. doing chemo  . Cancer Maternal Grandfather   . Diabetes Maternal Aunt   . Stomach cancer Neg Hx   . Pancreatic cancer Neg Hx   . Esophageal cancer Neg Hx   . Liver disease Neg Hx   . Colon cancer Neg Hx   . Rectal cancer Neg Hx    Allergies  Allergen Reactions  . Cyclobenzaprine Hives and Nausea Only  . Depakote [Divalproex Sodium] Other (See Comments)    "made him crazy"  . Ritalin [Methylphenidate Hcl] Other (See Comments)    Increased hyper activeness   . Naproxen Nausea Only    Acid reflux  . Other Other (See Comments)    [facility]-pt states skin became reddened and he tasted metal (08-2018)  .  Tramadol Other (See Comments)    Acid reflux   Prior to Admission medications   Medication Sig Start Date End Date Taking? Authorizing Provider  acetaminophen-codeine (TYLENOL #3) 300-30 MG tablet Take 1 tablet by mouth every 6 (six) hours as needed for moderate pain. 06/07/20  Yes Aundra Dubin, PA-C  celecoxib (CELEBREX) 100 MG capsule Take 1 capsule (100 mg total) by mouth 2 (two) times daily. 04/25/20  Yes Magnant, Gerrianne Scale, PA-C  Erenumab-aooe (AIMOVIG) 70 MG/ML SOAJ Inject 70 mg into the skin every 28 (twenty-eight) days. 08/25/19  Yes Jaffe, Adam R, DO  ISOtretinoin (ACCUTANE) 30 MG capsule Take 1 capsule (30 mg total) by mouth daily. 05/25/20  Yes Sheffield, Kelli R, PA-C  QUEtiapine (SEROQUEL) 100 MG tablet Take 1 tablet (100 mg total) by mouth at bedtime. 03/16/20  Yes Charlott Rakes, MD  tiZANidine (ZANAFLEX) 4 MG tablet Take 1 tablet (4 mg total) by mouth every 6 (six) hours as needed for muscle spasms. 05/26/20  Yes Leandrew Koyanagi, MD  Ubrogepant (UBRELVY) 100 MG TABS Take 1 tablet by mouth as needed (May repeat 1 tablet in 2 hours.  Maximum 2 tablets in 24 hours.). 01/18/20  Yes Jaffe, Adam R, DO  cetirizine (ZYRTEC) 10 MG tablet Take 1 tablet (10 mg total) by mouth daily. 05/23/20   Charlott Rakes, MD  famotidine (PEPCID) 40 MG tablet Take 1 tablet (40 mg total) by mouth daily. 08/26/18 12/28/19  Robyn Haber, MD     Positive ROS: All other systems have been reviewed and were otherwise negative with the exception of those mentioned in the HPI and as above.  Physical Exam: General: Alert, no acute distress Cardiovascular: No pedal edema Respiratory: No cyanosis, no use of accessory musculature GI: abdomen soft Skin: No lesions in the area of chief complaint Neurologic: Sensation intact distally Psychiatric: Patient is competent for consent with normal mood and affect Lymphatic: no lymphedema  MUSCULOSKELETAL: exam stable  Assessment: left biceps tear  Plan: Plan for Procedure(s): LEFT OPEN BICEPS TENODESIS, TENOLYSIS  The risks benefits and alternatives were discussed with the patient including but not limited to the risks of nonoperative treatment, versus surgical intervention including infection, bleeding, nerve injury,  blood clots, cardiopulmonary complications, morbidity, mortality, among others, and they were willing to proceed.   Preoperative templating of the joint replacement has been completed, documented, and submitted to the Operating Room personnel in order to optimize intra-operative equipment management.   Eduard Roux, MD 06/21/2020 12:38 PM

## 2020-06-21 NOTE — Anesthesia Procedure Notes (Signed)
Procedure Name: Intubation Performed by: Okema Rollinson S, CRNA Pre-anesthesia Checklist: Patient identified, Emergency Drugs available, Suction available and Patient being monitored Patient Re-evaluated:Patient Re-evaluated prior to induction Oxygen Delivery Method: Circle System Utilized Preoxygenation: Pre-oxygenation with 100% oxygen Induction Type: IV induction Ventilation: Mask ventilation without difficulty Laryngoscope Size: Mac and 3 Grade View: Grade I Tube type: Oral Tube size: 7.0 mm Number of attempts: 1 Airway Equipment and Method: Stylet and Oral airway Placement Confirmation: ETT inserted through vocal cords under direct vision,  positive ETCO2 and breath sounds checked- equal and bilateral Secured at: 22 cm Tube secured with: Tape Dental Injury: Teeth and Oropharynx as per pre-operative assessment        

## 2020-06-22 ENCOUNTER — Encounter (HOSPITAL_BASED_OUTPATIENT_CLINIC_OR_DEPARTMENT_OTHER): Payer: Self-pay | Admitting: Orthopaedic Surgery

## 2020-06-23 ENCOUNTER — Encounter: Payer: Self-pay | Admitting: Orthopaedic Surgery

## 2020-06-26 ENCOUNTER — Encounter (HOSPITAL_BASED_OUTPATIENT_CLINIC_OR_DEPARTMENT_OTHER): Payer: Self-pay | Admitting: Orthopaedic Surgery

## 2020-06-27 ENCOUNTER — Ambulatory Visit: Payer: No Typology Code available for payment source | Admitting: Physician Assistant

## 2020-06-28 ENCOUNTER — Encounter: Payer: Self-pay | Admitting: Physician Assistant

## 2020-06-28 ENCOUNTER — Other Ambulatory Visit: Payer: Self-pay

## 2020-06-28 ENCOUNTER — Telehealth: Payer: Self-pay | Admitting: Physician Assistant

## 2020-06-28 ENCOUNTER — Ambulatory Visit: Payer: No Typology Code available for payment source | Admitting: Physician Assistant

## 2020-06-28 ENCOUNTER — Ambulatory Visit (INDEPENDENT_AMBULATORY_CARE_PROVIDER_SITE_OTHER): Payer: No Typology Code available for payment source | Admitting: Physician Assistant

## 2020-06-28 ENCOUNTER — Inpatient Hospital Stay: Payer: No Typology Code available for payment source | Admitting: Orthopedic Surgery

## 2020-06-28 DIAGNOSIS — M67814 Other specified disorders of tendon, left shoulder: Secondary | ICD-10-CM

## 2020-06-28 MED ORDER — METHOCARBAMOL 750 MG PO TABS: 750.0000 mg | ORAL_TABLET | Freq: Three times a day (TID) | ORAL | 0 refills | Status: DC | PRN

## 2020-06-28 MED ORDER — OXYCODONE-ACETAMINOPHEN 5-325 MG PO TABS: 1.0000 | ORAL_TABLET | Freq: Three times a day (TID) | ORAL | 0 refills | Status: DC | PRN

## 2020-06-28 NOTE — Telephone Encounter (Signed)
Patient calling to see if he had any cancellations this morning. He states that he needs a morning appointment (he had a 9:45 am appointment yesterday 4/12 and R/S for this afternoon) I told the patient that we have not had any cancellations

## 2020-06-28 NOTE — Telephone Encounter (Signed)
Phone call to patient upon his request.  Voicemail left for patient to give the office a call back.

## 2020-06-28 NOTE — Telephone Encounter (Signed)
TE closed in error before documentation complete... Patient states that medication is not working anyway's and he was told that something else was going to be sent to the pharmacy. He asked for refill on Accutane and I explained that he has to be seen every 31 days for a refill of that particular medication due to government regulations. He was argumentative and refused to let me try to explain that the medication takes a while to start seeing results. He ultimately canceled his appointment at our office this afternoon and would like for someone to reach out to him. He informed me that he just had surgery and he has to have a morning appointment and he lives 2 hours away in [place].

## 2020-06-28 NOTE — Progress Notes (Signed)
Post-Op Visit Note   Patient: Jimmy [name]           Date of Birth: 08-28-1990           MRN: [id number] Visit Date: 06/28/2020 PCP: Charlott Rakes, MD   Assessment & Plan:  Chief Complaint:  Chief Complaint  Patient presents with  . Left Shoulder - Pain, Routine Post Op   Visit Diagnoses:  1. Biceps tendonosis of left shoulder     Plan: Patient is a pleasant 30 year old gentleman who comes in today 1 week out left open biceps tenodesis.  He has been doing well.  He has been compliant wearing a sling.  He has been taking Percocet and Robaxin for pain and muscle spasms.  Examination of his left shoulder reveals a well-healed surgical incision without evidence of infection or cellulitis.  Fingers are warm well perfused.  Today, Steri-Strips were applied.  He has been instructed to not lift any more than a few pounds and to wear a sling at all times for the next 6 weeks.  We will go ahead and start him in outpatient physical therapy for gentle range of motion.  He will follow up with [place] in 5 weeks time for recheck.  Call with concerns or questions in the meantime.  Follow-Up Instructions: Return in about 5 weeks (around 08/02/2020).   Orders:  No orders of the defined types were placed in this encounter.  Meds ordered this encounter  Medications  . oxyCODONE-acetaminophen (PERCOCET) 5-325 MG tablet    Sig: Take 1 tablet by mouth every 8 (eight) hours as needed.    Dispense:  30 tablet    Refill:  0  . methocarbamol (ROBAXIN) 750 MG tablet    Sig: Take 1 tablet (750 mg total) by mouth every 8 (eight) hours as needed for muscle spasms.    Dispense:  30 tablet    Refill:  0    Imaging: No new imaging  PMFS History: Patient Active Problem List   Diagnosis Date Noted  . History of Abnormal LFTs (liver function tests) 12/30/2019  . Abnormal liver ultrasound 12/30/2019  . Fatty liver 12/30/2019  . Right flank pain 12/30/2019  . Abdominal wall pain 12/30/2019  .  Gastroesophageal reflux disease without esophagitis 12/30/2019  . 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 Carpal tunnel syndrome 09/08/2019  . Bursitis of shoulder, right 08/31/2019  . Incisional hernia, without obstruction or gangrene 05/20/2018  . Opiate dependence ([place]) 08/02/2013   Past Medical History:  Diagnosis Date  . ADHD (attention deficit hyperactivity disorder)   . Anxiety   . Arthritis   . Cancer ([place])    skin cancer  . Chest pain 08/2018  . GERD (gastroesophageal reflux disease)    no meds  . History of COVID-19 04/05/2020  . Migraine    migraines due to pinched nerve  . Pinched nerve in neck   . Scoliosis     Family History  Problem Relation Age of Onset  . Diabetes Mother   . Kidney cancer Mother   . Diabetes Maternal Grandmother   . Cancer Maternal Grandmother        unknown type. doing chemo  . Cancer Maternal Grandfather   . Diabetes Maternal Aunt   . Stomach cancer Neg Hx   . Pancreatic cancer Neg Hx   . Esophageal cancer Neg Hx   . Liver disease Neg Hx   . Colon cancer Neg Hx   . Rectal  cancer Neg Hx     Past Surgical History:  Procedure Laterality Date  . BICEPT TENODESIS Left 06/21/2020   Procedure: BICEPS TENODESIS TENOLYSIS;  Surgeon: Leandrew Koyanagi, MD;  Location: [facility];  Service: Orthopedics;  Laterality: Left;  . ESOPHAGUS SURGERY     as a baby  . SHOULDER ARTHROSCOPY WITH BICEPS TENDON REPAIR Left 03/01/2020   Procedure: LEFT SHOULDER ARTHROSCOPY WITH BICEPS TENODESIS;  Surgeon: Leandrew Koyanagi, MD;  Location: [facility];  Service: Orthopedics;  Laterality: Left;  . SHOULDER ARTHROSCOPY WITH SUBACROMIAL DECOMPRESSION Right 09/01/2019   Procedure: RIGHT SHOULDER ARTHROSCOPY WITH EXTENSIVE DEBRIDEMENT AND SUBACROMIAL DECOMPRESSION;  Surgeon: Leandrew Koyanagi, MD;  Location: [facility];  Service: Orthopedics;  Laterality: Right;  . VENTRAL HERNIA REPAIR N/A  08/31/2018   Procedure: HERNIA REPAIR VENTRAL ADULT;  Surgeon: Olean Ree, MD;  Location: [facility];  Service: General;  Laterality: N/A;   Social History   Occupational History  . Not on file  Tobacco Use  . Smoking status: Current Every Day Smoker    Packs/day: 1.00    Years: 15.00    Pack years: 15.00    Types: Cigarettes  . Smokeless tobacco: Former Network engineer  . Vaping Use: Former  Substance and Sexual Activity  . Alcohol use: Not Currently  . Drug use: Not Currently  . Sexual activity: Yes

## 2020-06-29 ENCOUNTER — Ambulatory Visit: Payer: No Typology Code available for payment source | Attending: Physician Assistant

## 2020-06-29 DIAGNOSIS — M67814 Other specified disorders of tendon, left shoulder: Secondary | ICD-10-CM | POA: Diagnosis present

## 2020-06-29 DIAGNOSIS — M25612 Stiffness of left shoulder, not elsewhere classified: Secondary | ICD-10-CM | POA: Diagnosis present

## 2020-06-29 DIAGNOSIS — M25512 Pain in left shoulder: Secondary | ICD-10-CM

## 2020-06-29 DIAGNOSIS — M25511 Pain in right shoulder: Secondary | ICD-10-CM | POA: Diagnosis present

## 2020-06-29 DIAGNOSIS — M6281 Muscle weakness (generalized): Secondary | ICD-10-CM | POA: Insufficient documentation

## 2020-06-29 DIAGNOSIS — R293 Abnormal posture: Secondary | ICD-10-CM | POA: Insufficient documentation

## 2020-07-01 NOTE — Therapy (Signed)
[place], [name], [address] Phone: [phone]   Fax:  [phone]  Physical Therapy Evaluation  Patient Details  Name: Jimmy [name] MRN: [id number] Date of Birth: Aug 05, 1990 Referring Provider (PT): Aundra Dubin, [place]   Encounter Date: 06/29/2020   PT End of Session - 07/01/20 0257    Visit Number 1    Number of Visits 17    Date for PT Re-Evaluation 09/02/20    Authorization Type [employer] [place] PREFERRED    Progress Note Due on Visit 10    PT Start Time 1537    PT Stop Time 1621    PT Time Calculation (min) 44 min    Activity Tolerance Patient tolerated treatment well    Behavior During Therapy [facility] for tasks assessed/performed           Past Medical History:  Diagnosis Date  . ADHD (attention deficit hyperactivity disorder)   . Anxiety   . Arthritis   . Cancer ([place])    skin cancer  . Chest pain 08/2018  . GERD (gastroesophageal reflux disease)    no meds  . History of COVID-19 04/05/2020  . Migraine    migraines due to pinched nerve  . Pinched nerve in neck   . Scoliosis     Past Surgical History:  Procedure Laterality Date  . BICEPT TENODESIS Left 06/21/2020   Procedure: BICEPS TENODESIS TENOLYSIS;  Surgeon: Leandrew Koyanagi, MD;  Location: [facility];  Service: Orthopedics;  Laterality: Left;  . ESOPHAGUS SURGERY     as a baby  . SHOULDER ARTHROSCOPY WITH BICEPS TENDON REPAIR Left 03/01/2020   Procedure: LEFT SHOULDER ARTHROSCOPY WITH BICEPS TENODESIS;  Surgeon: Leandrew Koyanagi, MD;  Location: [facility];  Service: Orthopedics;  Laterality: Left;  . SHOULDER ARTHROSCOPY WITH SUBACROMIAL DECOMPRESSION Right 09/01/2019   Procedure: RIGHT SHOULDER ARTHROSCOPY WITH EXTENSIVE DEBRIDEMENT AND SUBACROMIAL DECOMPRESSION;  Surgeon: Leandrew Koyanagi, MD;  Location: [facility];  Service: Orthopedics;  Laterality: Right;  . VENTRAL HERNIA REPAIR N/A  08/31/2018   Procedure: HERNIA REPAIR VENTRAL ADULT;  Surgeon: Olean Ree, MD;  Location: [facility];  Service: General;  Laterality: N/A;    There were no vitals filed for this visit.    Subjective Assessment - 07/01/20 0247    Subjective Pt reports he injured his L shoulder on 03/21/20 when his foot slipped climbing into a loader and to prevent falling, he grabbed the loader with his L hand. Prior to this incident, the pt had surgery on his L shoulder for a labrum tear on 03/01/20.    Limitations House hold activities;Other (comment)   use of L amr   Patient Stated Goals Get back to normal and return to work full-duty.    Currently in Pain? Yes    Pain Score 7     Pain Location Shoulder    Pain Orientation Left    Pain Descriptors / Indicators Aching;Throbbing;Sharp    Pain Type Surgical pain    Pain Onset More than a month ago    Pain Frequency Constant    Aggravating Factors  Movement of the  L arm              [facility] PT Assessment - 07/01/20 0001      Assessment   Medical Diagnosis Biceps tenodesis of left shoulder    Referring Provider (PT) Aundra Dubin, PA-C    Onset Date/Surgical Date 06/21/20  Hand Dominance Right    Next MD Visit 5 weeks    Prior Therapy not since most recent surgery      Precautions   Precautions Shoulder    Type of Shoulder Precautions Shoulder flexion AAROM limted to 90d and ER to 45d; don't lift more than a few lbs    Required Braces or Orthoses Sling   To wear at all times     Balance Screen   Has the patient fallen in the past 6 months Yes    How many times? 1    Has the patient had a decrease in activity level because of a fear of falling?  No    Is the patient reluctant to leave their home because of a fear of falling?  No      Home Ecologist residence    Living Arrangements Spouse/significant other    Type of [place] to enter      Prior [place] --   medical  leave   Customer service manager; Company secretary      Cognition   Overall Cognitive Status Within Functional Limits for tasks assessed      Observation/Other Assessments   Focus on Therapeutic Outcomes (FOTO)  39% ability      Sensation   Light Touch Appears Intact      ROM / Strength   AROM / PROM / Strength PROM;Strength      AROM   Overall AROM Comments Increase in pain as moving tward end ranges    Left Shoulder Flexion 60 Degrees    Left Shoulder ABduction --   NT   Left Shoulder Internal Rotation --   holds across abdomin   Left Shoulder External Rotation 0 Degrees      PROM   PROM Assessment Site Shoulder    Right/Left Shoulder Left    Left Shoulder Flexion 60 Degrees    Left Shoulder ABduction --   NT   Left Shoulder Internal Rotation --   Holds across abdomin   Left Shoulder External Rotation 0 Degrees      Strength   Overall Strength Comments NT                      Objective measurements completed on examination: See above findings.               PT Education - 07/01/20 0256    Education Details Eval findings, POC, HEP, use of RICE for pain andswelling management    Person(s) Educated Patient    Methods Explanation;Demonstration;Tactile cues;Verbal cues;Handout    Comprehension Verbalized understanding;Returned demonstration;Verbal cues required;Tactile cues required            PT Short Term Goals - 07/01/20 0325      PT SHORT TERM GOAL #1   Title Pt will be Ind in an initial HEP    Baseline Started on eval    Status New    Target Date 07/22/20      PT SHORT TERM GOAL #2   Title Pt will voice understanding of measures to assist in the management of L shoulder pain and function    Target Date 07/22/20      PT SHORT TERM GOAL #3   Title Increase L shoulder flexion to 90d and ER to 45d    Status New    Target Date 07/22/20  PT Long Term Goals - 07/01/20 0335      PT LONG TERM GOAL #1    Title Improve FOTO to predicted value of 69%    Baseline 39%    Status New    Target Date 09/02/20      PT LONG TERM GOAL #2   Title Improve L shoulder AROM to 90% of the uninvolved R.    Status New    Target Date 09/02/20      PT LONG TERM GOAL #3   Title Increase L shoulder strength to 5/5 MMT for all motions to optimize function for home and work activites    Status New    Target Date 09/02/20      PT LONG TERM GOAL #4   Title Pt will reports L shoulder pain of 3/10 or less with daily and work related activities    Baseline 7/10    Status New    Target Date 09/02/20      PT LONG TERM GOAL #5   Title Pt will be Ind in a final HEP to maintain or progress achieved LOF    Status New    Target Date 09/02/20      Additional Long Term Goals   Additional Long Term Goals Yes      PT LONG TERM GOAL #6   Title Set work specific goals for the L shoulder as work requirements are determined                  Plan - 07/01/20 0300    Clinical Impression Statement Pt presents to PT 8 days following L bicep tenodesis of left shoulder for gentle ROM. Pt was started on penulum exs and AAROM exs for flexion (limited to 90d) and ER (limited to 45d). Pt will benefit from PT 2w8 with Ther Ex will be progressed as per SLAP protocol to address ROM, strength and function of the L shoulder.    Examination-Activity Limitations Bathing;Dressing;Sleep;Hygiene/Grooming;Bed Mobility;Carry;Reach Overhead;Lift    Examination-Participation Restrictions Occupation;Community Activity    Stability/Clinical Decision Making Stable/Uncomplicated    Clinical Decision Making Low    Rehab Potential Good    PT Frequency 2x / week    PT Duration 8 weeks    PT Treatment/Interventions ADLs/Self Care Home Management;Iontophoresis 4mg /ml Dexamethasone;Cryotherapy;Therapeutic activities;Therapeutic exercise;Neuromuscular re-education;Patient/family education;Manual techniques;Dry needling;Vasopneumatic Device;Joint  Manipulations;Electrical Stimulation;Moist Heat;Ultrasound;Taping    PT Next Visit Plan Assess response to HEP. Progress ther ex as per protocol    PT Home Exercise Plan [id number]    Consulted and Agree with Plan of Care Patient           Patient will benefit from skilled therapeutic intervention in order to improve the following deficits and impairments:  Decreased endurance,Decreased range of motion,Decreased strength,Increased edema,Impaired UE functional use,Pain  Visit Diagnosis: Muscle weakness (generalized)  Stiffness of left shoulder, not elsewhere classified  Left shoulder pain, unspecified chronicity  Biceps tendonosis of left shoulder     Problem List Patient Active Problem List   Diagnosis Date Noted  . History of Abnormal LFTs (liver function tests) 12/30/2019  . Abnormal liver ultrasound 12/30/2019  . Fatty liver 12/30/2019  . Right flank pain 12/30/2019  . Abdominal wall pain 12/30/2019  . Gastroesophageal reflux disease without esophagitis 12/30/2019  . 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 Carpal tunnel syndrome 09/08/2019  . Bursitis of shoulder, right 08/31/2019  . Incisional hernia, without obstruction or gangrene 05/20/2018  . Opiate dependence ([place]) 08/02/2013  Gar Ponto MS, PT 07/01/20 3:45 AM  [facility] [address] [place], [name], [address] Phone: [phone]   Fax:  [phone]  Name: Jimmy [name] MRN: [id number] Date of Birth: Sep 18, 1990

## 2020-07-04 ENCOUNTER — Ambulatory Visit: Payer: No Typology Code available for payment source

## 2020-07-05 ENCOUNTER — Telehealth: Payer: Self-pay

## 2020-07-05 NOTE — Telephone Encounter (Signed)
Spoke with pt re: no show appt yesterday. Reviewed no show policy. Pt reports he is planning to attend tomorrow's scheduled PT appt.

## 2020-07-06 ENCOUNTER — Other Ambulatory Visit: Payer: Self-pay

## 2020-07-06 ENCOUNTER — Other Ambulatory Visit: Payer: Self-pay | Admitting: Physician Assistant

## 2020-07-06 ENCOUNTER — Encounter: Payer: Self-pay | Admitting: Orthopaedic Surgery

## 2020-07-06 ENCOUNTER — Ambulatory Visit: Payer: No Typology Code available for payment source

## 2020-07-06 ENCOUNTER — Other Ambulatory Visit: Payer: Self-pay | Admitting: Orthopaedic Surgery

## 2020-07-06 ENCOUNTER — Telehealth: Payer: Self-pay

## 2020-07-06 DIAGNOSIS — M25511 Pain in right shoulder: Secondary | ICD-10-CM

## 2020-07-06 DIAGNOSIS — R293 Abnormal posture: Secondary | ICD-10-CM

## 2020-07-06 DIAGNOSIS — M6281 Muscle weakness (generalized): Secondary | ICD-10-CM | POA: Diagnosis not present

## 2020-07-06 DIAGNOSIS — M25612 Stiffness of left shoulder, not elsewhere classified: Secondary | ICD-10-CM

## 2020-07-06 DIAGNOSIS — M67814 Other specified disorders of tendon, left shoulder: Secondary | ICD-10-CM

## 2020-07-06 DIAGNOSIS — M25512 Pain in left shoulder: Secondary | ICD-10-CM

## 2020-07-06 MED ORDER — OXYCODONE-ACETAMINOPHEN 5-325 MG PO TABS: 1.0000 | ORAL_TABLET | Freq: Three times a day (TID) | ORAL | 0 refills | Status: DC | PRN

## 2020-07-06 NOTE — Telephone Encounter (Signed)
Patient called regarding my chart message he sent he is requesting a rx refill for oxycodone and methocarbamol patient is also requesting a higher dosage of methocarbamol . Patient stated he takes oxycodone at 6 am when he wakes up but on days when he has pt he stated the pain is worse so he doesn't take another pill due to taking one early in the morning and only having so many, patient is requesting a call back regarding other options call back:[phone]

## 2020-07-06 NOTE — Therapy (Signed)
[place], [name], [address] Phone: [phone]   Fax:  [phone]  Physical Therapy Treatment  Patient Details  Name: Jimmy [name] MRN: [id number] Date of Birth: January 16, 1991 Referring Provider (PT): Aundra Dubin, [place]   Encounter Date: 07/06/2020   PT End of Session - 07/06/20 1035    Visit Number 2    Number of Visits 17    Date for PT Re-Evaluation 09/02/20    Authorization Type [employer] [place] PREFERRED    Progress Note Due on Visit 10    PT Start Time 0915    PT Stop Time 1000    PT Time Calculation (min) 45 min    Equipment Utilized During Treatment Other (comment)   L shoulder sling   Activity Tolerance Patient tolerated treatment well    Behavior During Therapy [facility] for tasks assessed/performed           Past Medical History:  Diagnosis Date  . ADHD (attention deficit hyperactivity disorder)   . Anxiety   . Arthritis   . Cancer ([place])    skin cancer  . Chest pain 08/2018  . GERD (gastroesophageal reflux disease)    no meds  . History of COVID-19 04/05/2020  . Migraine    migraines due to pinched nerve  . Pinched nerve in neck   . Scoliosis     Past Surgical History:  Procedure Laterality Date  . BICEPT TENODESIS Left 06/21/2020   Procedure: BICEPS TENODESIS TENOLYSIS;  Surgeon: Leandrew Koyanagi, MD;  Location: [place];  Service: Orthopedics;  Laterality: Left;  . ESOPHAGUS SURGERY     as a baby  . SHOULDER ARTHROSCOPY WITH BICEPS TENDON REPAIR Left 03/01/2020   Procedure: LEFT SHOULDER ARTHROSCOPY WITH BICEPS TENODESIS;  Surgeon: Leandrew Koyanagi, MD;  Location: [place];  Service: Orthopedics;  Laterality: Left;  . SHOULDER ARTHROSCOPY WITH SUBACROMIAL DECOMPRESSION Right 09/01/2019   Procedure: RIGHT SHOULDER ARTHROSCOPY WITH EXTENSIVE DEBRIDEMENT AND SUBACROMIAL DECOMPRESSION;  Surgeon: Leandrew Koyanagi, MD;  Location: [place];   Service: Orthopedics;  Laterality: Right;  . VENTRAL HERNIA REPAIR N/A 08/31/2018   Procedure: HERNIA REPAIR VENTRAL ADULT;  Surgeon: Olean Ree, MD;  Location: [facility];  Service: General;  Laterality: N/A;    There were no vitals filed for this visit.   Subjective Assessment - 07/06/20 0921    Subjective Pt reports being too active with his L arm on Sunday. He states he was cooking with the L arm out of the sling , but with his arm held across his stomach and his L shoulder started hurting more. Pt states he put the sling back on and laid down and the pain decreased.    Pertinent History L shoulder arthroscopy to address 7-12 o'clock labral tear and biceps tenodesis on 03/01/2020.    Limitations House hold activities;Other (comment)    Patient Stated Goals Get back to normal and return to work full-duty.    Currently in Pain? Yes    Pain Score 7    without pain medication   Pain Location Shoulder    Pain Orientation Left    Pain Descriptors / Indicators Sharp;Aching;Throbbing;Tightness    Pain Type Surgical pain    Pain Onset More than a month ago    Pain Frequency Constant              [facility] PT Assessment - 07/06/20 0001      AROM  Left Shoulder Flexion 90 Degrees    Left Shoulder ABduction 80 Degrees    Left Shoulder External Rotation 10 Degrees                         [facility] Adult PT Treatment/Exercise - 07/06/20 0001      Shoulder Exercises: Standing   External Rotation AAROM;Left;10 reps   20 sec; ROM limited to 45d   Flexion AAROM;Left;10 reps   20 sec; ROM limited to 90d   ABduction AAROM;Left;10 reps   20 sec; ROM c wand, limited to 90d     Manual Therapy   Manual Therapy Passive ROM    Passive ROM PROM was completed for L shoulder flexion, 90d limit; abduction (elbow at 90d),  90d limit; and ER (Arm by side), 45d limit; each motion 10x; c 20 sec stretch. For flexion and abduction the endfeel was the empty with the development. The endfeel for ER  was firm with the development of pain.                  PT Education - 07/06/20 [phone]    Education Details Reminded pt of precaution to wear sling unless completing HEP or taking a shower. HEP updated.    Person(s) Educated Patient    Methods Explanation;Demonstration;Tactile cues;Verbal cues;Handout    Comprehension Verbalized understanding;Returned demonstration;Verbal cues required;Tactile cues required            PT Short Term Goals - 07/01/20 0325      PT SHORT TERM GOAL #1   Title Pt will be Ind in an initial HEP    Baseline Started on eval    Status New    Target Date 07/22/20      PT SHORT TERM GOAL #2   Title Pt will voice understanding of measures to assist in the management of L shoulder pain and function    Target Date 07/22/20      PT SHORT TERM GOAL #3   Title Increase L shoulder flexion to 90d and ER to 45d    Status New    Target Date 07/22/20             PT Long Term Goals - 07/01/20 0335      PT LONG TERM GOAL #1   Title Improve FOTO to predicted value of 69%    Baseline 39%    Status New    Target Date 09/02/20      PT LONG TERM GOAL #2   Title Improve L shoulder AROM to 90% of the uninvolved R.    Status New    Target Date 09/02/20      PT LONG TERM GOAL #3   Title Increase L shoulder strength to 5/5 MMT for all motions to optimize function for home and work activites    Status New    Target Date 09/02/20      PT LONG TERM GOAL #4   Title Pt will reports L shoulder pain of 3/10 or less with daily and work related activities    Baseline 7/10    Status New    Target Date 09/02/20      PT LONG TERM GOAL #5   Title Pt will be Ind in a final HEP to maintain or progress achieved LOF    Status New    Target Date 09/02/20      Additional Long Term Goals   Additional Long Term Goals Yes  PT LONG TERM GOAL #6   Title Set work specific goals for the L shoulder as work requirements are determined                 Plan -  07/06/20 1036    Clinical Impression Statement L shoulder rehab was completed for PT and pt assisted ROM exs for the L shoulder for flexion, abduction and ER. See flowsheet for ROM limits. L shoulder flexion and abd are progressing appropriately, while ER is progressing slowly from 0 to 10d of motion. The endfeel for flexion and abduction are empty related to pain, while ER has an firm endfeel with pain. Pt reports he has been completing his HEP which was updated today. Pt reports no overall increase in L shoulder pain with the PT session. After the session, pt declined a cold pack stating ir causes his L shoulder to hurt more. No appreciable swelling was noted of the L shoulder today.    Personal Factors and Comorbidities --    Examination-Activity Limitations Bathing;Dressing;Sleep;Hygiene/Grooming;Bed Mobility;Carry;Reach Overhead;Lift    Examination-Participation Restrictions Occupation;Community Activity    Stability/Clinical Decision Making Stable/Uncomplicated    Clinical Decision Making Low    Rehab Potential Good    PT Frequency 2x / week    PT Duration 8 weeks    PT Treatment/Interventions ADLs/Self Care Home Management;Iontophoresis 4mg /ml Dexamethasone;Cryotherapy;Therapeutic activities;Therapeutic exercise;Neuromuscular re-education;Patient/family education;Manual techniques;Dry needling;Vasopneumatic Device;Joint Manipulations;Electrical Stimulation;Moist Heat;Ultrasound;Taping    PT Next Visit Plan Assess response to HEP. Progress ther ex as per protocol. Pt is transferred to [facility] facility to be closer to his home.    PT Home Exercise Plan [id number]-Written modifications have been added to the text of the pt's program    Consulted and Agree with Plan of Care Patient           Patient will benefit from skilled therapeutic intervention in order to improve the following deficits and impairments:  Decreased endurance,Decreased range of motion,Decreased strength,Increased  edema,Impaired UE functional use,Pain  Visit Diagnosis: Muscle weakness (generalized)  Stiffness of left shoulder, not elsewhere classified  Left shoulder pain, unspecified chronicity  Biceps tendonosis of left shoulder  Abnormal posture  Acute pain of left shoulder  Right shoulder pain, unspecified chronicity     Problem List Patient Active Problem List   Diagnosis Date Noted  . History of Abnormal LFTs (liver function tests) 12/30/2019  . Abnormal liver ultrasound 12/30/2019  . Fatty liver 12/30/2019  . Right flank pain 12/30/2019  . Abdominal wall pain 12/30/2019  . Gastroesophageal reflux disease without esophagitis 12/30/2019  . 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 Carpal tunnel syndrome 09/08/2019  . Bursitis of shoulder, right 08/31/2019  . Incisional hernia, without obstruction or gangrene 05/20/2018  . Opiate dependence ([place]) 08/02/2013    Gar Ponto MS, PT 07/06/20 12:42 PM  [facility] [address] [place], [name], [address] Phone: [phone]   Fax:  [phone]  Name: Jimmy [name] MRN: [id number] Date of Birth: Jun 25, 1990

## 2020-07-06 NOTE — Telephone Encounter (Signed)
Sent in

## 2020-07-06 NOTE — Telephone Encounter (Signed)
Patient portal message already sent to lindsey

## 2020-07-06 NOTE — Patient Instructions (Signed)
     Written modifications have been added to the text of the pt's program

## 2020-07-10 ENCOUNTER — Ambulatory Visit (HOSPITAL_COMMUNITY): Payer: No Typology Code available for payment source

## 2020-07-11 ENCOUNTER — Encounter: Payer: Self-pay | Admitting: Family Medicine

## 2020-07-12 ENCOUNTER — Ambulatory Visit (HOSPITAL_COMMUNITY): Payer: No Typology Code available for payment source | Attending: Physician Assistant

## 2020-07-12 ENCOUNTER — Encounter: Payer: Self-pay | Admitting: Orthopaedic Surgery

## 2020-07-12 ENCOUNTER — Encounter (HOSPITAL_COMMUNITY): Payer: Self-pay

## 2020-07-12 ENCOUNTER — Telehealth: Payer: Self-pay | Admitting: Orthopaedic Surgery

## 2020-07-12 ENCOUNTER — Other Ambulatory Visit: Payer: Self-pay

## 2020-07-12 ENCOUNTER — Telehealth: Payer: Self-pay

## 2020-07-12 ENCOUNTER — Other Ambulatory Visit: Payer: Self-pay | Admitting: Family Medicine

## 2020-07-12 DIAGNOSIS — M25612 Stiffness of left shoulder, not elsewhere classified: Secondary | ICD-10-CM

## 2020-07-12 DIAGNOSIS — M67814 Other specified disorders of tendon, left shoulder: Secondary | ICD-10-CM | POA: Diagnosis present

## 2020-07-12 DIAGNOSIS — R293 Abnormal posture: Secondary | ICD-10-CM | POA: Diagnosis present

## 2020-07-12 DIAGNOSIS — M6281 Muscle weakness (generalized): Secondary | ICD-10-CM | POA: Diagnosis not present

## 2020-07-12 DIAGNOSIS — M25512 Pain in left shoulder: Secondary | ICD-10-CM | POA: Insufficient documentation

## 2020-07-12 DIAGNOSIS — S43432A Superior glenoid labrum lesion of left shoulder, initial encounter: Secondary | ICD-10-CM

## 2020-07-12 NOTE — Telephone Encounter (Signed)
Patient called he is requesting a rx refill oxycodone and methocarbamol call back:[phone]

## 2020-07-12 NOTE — Telephone Encounter (Signed)
Pt called asking for a refill of his robaxin and percocent 5-325; pt states his pain has been so great he has to take 2 in the morning and 2 at night. Pt would like a CB when this has been sent to his [facility] in [place]

## 2020-07-12 NOTE — Therapy (Signed)
[name], [name], [address] Phone: [phone]   Fax:  [phone]  Physical Therapy Treatment  Patient Details  Name: Jimmy [name] MRN: [id number] Date of Birth: [birth year] Referring Provider (PT): Aundra Dubin, [place]   Encounter Date: 07/12/2020   PT End of Session - 07/12/20 0858    Visit Number 3    Number of Visits 17    Date for PT Re-Evaluation 09/02/20    Authorization Type Aetna [place] PREFERRED    Progress Note Due on Visit 10    PT Start Time 0900    PT Stop Time 0940    PT Time Calculation (min) 40 min    Equipment Utilized During Treatment Other (comment)   L shoulder sling   Activity Tolerance Patient tolerated treatment well    Behavior During Therapy [facility] for tasks assessed/performed           Past Medical History:  Diagnosis Date  . ADHD (attention deficit hyperactivity disorder)   . Anxiety   . Arthritis   . Cancer ([place])    skin cancer  . Chest pain 08/2018  . GERD (gastroesophageal reflux disease)    no meds  . History of COVID-19 04/05/2020  . Migraine    migraines due to pinched nerve  . Pinched nerve in neck   . Scoliosis     Past Surgical History:  Procedure Laterality Date  . BICEPT TENODESIS Left 06/21/2020   Procedure: BICEPS TENODESIS TENOLYSIS;  Surgeon: Leandrew Koyanagi, MD;  Location: [place];  Service: Orthopedics;  Laterality: Left;  . ESOPHAGUS SURGERY     as a baby  . SHOULDER ARTHROSCOPY WITH BICEPS TENDON REPAIR Left 03/01/2020   Procedure: LEFT SHOULDER ARTHROSCOPY WITH BICEPS TENODESIS;  Surgeon: Leandrew Koyanagi, MD;  Location: [place];  Service: Orthopedics;  Laterality: Left;  . SHOULDER ARTHROSCOPY WITH SUBACROMIAL DECOMPRESSION Right 09/01/2019   Procedure: RIGHT SHOULDER ARTHROSCOPY WITH EXTENSIVE DEBRIDEMENT AND SUBACROMIAL DECOMPRESSION;  Surgeon: Leandrew Koyanagi, MD;  Location: [place];  Service:  Orthopedics;  Laterality: Right;  . VENTRAL HERNIA REPAIR N/A 08/31/2018   Procedure: HERNIA REPAIR VENTRAL ADULT;  Surgeon: Olean Ree, MD;  Location: [facility];  Service: General;  Laterality: N/A;    There were no vitals filed for this visit.   Subjective Assessment - 07/12/20 0903    Subjective Pt reports no new issues since his last therapy appointment other than soreness.    Pertinent History L shoulder arthroscopy to address 7-12 o'clock labral tear and biceps tenodesis on 03/01/2020.    Limitations House hold activities;Other (comment)    Patient Stated Goals Get back to normal and return to work full-duty.    Currently in Pain? Yes    Pain Score 6     Pain Location Shoulder    Pain Orientation Left    Pain Descriptors / Indicators Aching;Sharp;Tightness    Pain Type Surgical pain    Pain Onset 1 to 4 weeks ago    Pain Frequency Intermittent              [facility] PT Assessment - 07/12/20 0001      Assessment   Medical Diagnosis Biceps tenodesis of left shoulder    Referring Provider (PT) Aundra Dubin, PA-C    Onset Date/Surgical Date 06/21/20    Next MD Visit 08/02/20      AROM   Left Shoulder Flexion 90 Degrees  in supine   Left Shoulder ABduction 80 Degrees   in supine                        [facility] Adult PT Treatment/Exercise - 07/12/20 0001      Exercises   Exercises Shoulder      Shoulder Exercises: Supine   External Rotation AAROM;Left;20 reps    External Rotation Weight (lbs) PVC pipe    External Rotation Limitations tolerating 10 degrees    Flexion AAROM;Both;20 reps    Shoulder Flexion Weight (lbs) PVC pipe    ABduction AAROM;Left;20 reps    Shoulder ABduction Weight (lbs) PVC pipe      Shoulder Exercises: Seated   Retraction AROM;Both;20 reps      Manual Therapy   Manual Therapy Passive ROM    Passive ROM PROM was completed for L shoulder flexion, 90d limit; abduction (elbow at 90d),  90d limit; and ER (Arm by side), 45d  limit; each motion 10x; c 20 sec stretch. For flexion and abduction the endfeel was the empty with the development. The endfeel for ER was firm with the development of pain. Massage to scalenes and upper trap LUE                  PT Education - 07/12/20 0906    Education Details education and discussion on rehab protocol and explanation of phase 1,2,3 and time line for progression    Person(s) Educated Patient    Methods Explanation    Comprehension Verbalized understanding            PT Short Term Goals - 07/01/20 0325      PT SHORT TERM GOAL #1   Title Pt will be Ind in an initial HEP    Baseline Started on eval    Status New    Target Date 07/22/20      PT SHORT TERM GOAL #2   Title Pt will voice understanding of measures to assist in the management of L shoulder pain and function    Target Date 07/22/20      PT SHORT TERM GOAL #3   Title Increase L shoulder flexion to 90d and ER to 45d    Status New    Target Date 07/22/20             PT Long Term Goals - 07/01/20 0335      PT LONG TERM GOAL #1   Title Improve FOTO to predicted value of 69%    Baseline 39%    Status New    Target Date 09/02/20      PT LONG TERM GOAL #2   Title Improve L shoulder AROM to 90% of the uninvolved R.    Status New    Target Date 09/02/20      PT LONG TERM GOAL #3   Title Increase L shoulder strength to 5/5 MMT for all motions to optimize function for home and work activites    Status New    Target Date 09/02/20      PT LONG TERM GOAL #4   Title Pt will reports L shoulder pain of 3/10 or less with daily and work related activities    Baseline 7/10    Status New    Target Date 09/02/20      PT LONG TERM GOAL #5   Title Pt will be Ind in a final HEP to maintain or progress achieved LOF  Status New    Target Date 09/02/20      Additional Long Term Goals   Additional Long Term Goals Yes      PT LONG TERM GOAL #6   Title Set work specific goals for the L shoulder  as work requirements are determined                 Plan - 07/12/20 0940    Clinical Impression Statement Pt tolerating tx session well and able to progress PROM to 90 degrees flexion and abduction. Difficulty with external rotation only tolerating 10 degrees with AAROM left shoulder but able to progress with PROM by therapist to 20 degrees with tightness and empty-endfeel due to pain/guarding. Educated extensively on protection phase of left shoulder at this time    Examination-Activity Limitations Bathing;Dressing;Sleep;Hygiene/Grooming;Bed Mobility;Carry;Reach Overhead;Lift    Examination-Participation Restrictions Occupation;Community Activity    Stability/Clinical Decision Making Stable/Uncomplicated    Rehab Potential Good    PT Frequency 2x / week    PT Duration 8 weeks    PT Treatment/Interventions ADLs/Self Care Home Management;Iontophoresis 4mg /ml Dexamethasone;Cryotherapy;Therapeutic activities;Therapeutic exercise;Neuromuscular re-education;Patient/family education;Manual techniques;Dry needling;Vasopneumatic Device;Joint Manipulations;Electrical Stimulation;Moist Heat;Ultrasound;Taping    PT Next Visit Plan AAROM with back against wall    PT Home Exercise Plan [id number]-Written modifications have been added to the text of the pt's program    Consulted and Agree with Plan of Care Patient           Patient will benefit from skilled therapeutic intervention in order to improve the following deficits and impairments:  Decreased endurance,Decreased range of motion,Decreased strength,Increased edema,Impaired UE functional use,Pain  Visit Diagnosis: Muscle weakness (generalized)  Stiffness of left shoulder, not elsewhere classified  Left shoulder pain, unspecified chronicity  Biceps tendonosis of left shoulder  Abnormal posture  Acute pain of left shoulder     Problem List Patient Active Problem List   Diagnosis Date Noted  . History of Abnormal LFTs (liver  function tests) 12/30/2019  . Abnormal liver ultrasound 12/30/2019  . Fatty liver 12/30/2019  . Right flank pain 12/30/2019  . Abdominal wall pain 12/30/2019  . Gastroesophageal reflux disease without esophagitis 12/30/2019  . 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 Carpal tunnel syndrome 09/08/2019  . Bursitis of shoulder, right 08/31/2019  . Incisional hernia, without obstruction or gangrene 05/20/2018  . Opiate dependence ([place]) 08/02/2013   9:43 AM, 07/12/20 M. Sherlyn Lees, PT, DPT Physical Therapist- [place] Office Number: [phone]  [facility] [address] [place], [name], [address] Phone: [phone]   Fax:  [phone]  Name: Jimmy [name] MRN: [id number] Date of Birth: 06-04-1990

## 2020-07-13 ENCOUNTER — Ambulatory Visit: Payer: No Typology Code available for payment source

## 2020-07-13 ENCOUNTER — Other Ambulatory Visit: Payer: Self-pay | Admitting: Physician Assistant

## 2020-07-13 MED ORDER — METHOCARBAMOL 750 MG PO TABS: 750.0000 mg | ORAL_TABLET | Freq: Three times a day (TID) | ORAL | 0 refills | Status: DC | PRN

## 2020-07-13 MED ORDER — HYDROCODONE-ACETAMINOPHEN 5-325 MG PO TABS: 1.0000 | ORAL_TABLET | Freq: Two times a day (BID) | ORAL | 0 refills | Status: DC | PRN

## 2020-07-13 NOTE — Telephone Encounter (Signed)
Sent in

## 2020-07-13 NOTE — Telephone Encounter (Signed)
We have to wean to [place] at this point and I have sent in

## 2020-07-17 ENCOUNTER — Encounter (HOSPITAL_COMMUNITY): Payer: Self-pay

## 2020-07-17 ENCOUNTER — Ambulatory Visit (HOSPITAL_COMMUNITY): Payer: No Typology Code available for payment source | Attending: Physician Assistant

## 2020-07-17 ENCOUNTER — Other Ambulatory Visit: Payer: Self-pay

## 2020-07-17 DIAGNOSIS — M67814 Other specified disorders of tendon, left shoulder: Secondary | ICD-10-CM

## 2020-07-17 DIAGNOSIS — M25512 Pain in left shoulder: Secondary | ICD-10-CM

## 2020-07-17 DIAGNOSIS — R293 Abnormal posture: Secondary | ICD-10-CM | POA: Insufficient documentation

## 2020-07-17 DIAGNOSIS — M25612 Stiffness of left shoulder, not elsewhere classified: Secondary | ICD-10-CM | POA: Insufficient documentation

## 2020-07-17 DIAGNOSIS — M6281 Muscle weakness (generalized): Secondary | ICD-10-CM

## 2020-07-17 NOTE — Therapy (Addendum)
[place], [name], [address] Phone: [phone]   Fax:  [phone]  Physical Therapy Treatment/Discharge  Patient Details  Name: Jimmy [name] MRN: [id number] Date of Birth: Jul 16, 1990 Referring Provider (PT): Aundra Dubin, [place]   Encounter Date: 07/17/2020   PT End of Session - 07/17/20 1041     Visit Number 4    Number of Visits 17    Date for PT Re-Evaluation 09/02/20    Authorization Type [employer] [place] PREFERRED    Progress Note Due on Visit 10    PT Start Time [phone]   patient late   PT Stop Time 0932    PT Time Calculation (min) 34 min    Equipment Utilized During Treatment Other (comment)   L shoulder sling   Activity Tolerance Patient tolerated treatment well    Behavior During Therapy [facility] for tasks assessed/performed             Past Medical History:  Diagnosis Date   ADHD (attention deficit hyperactivity disorder)    Anxiety    Arthritis    Cancer ([place])    skin cancer   Chest pain 08/2018   GERD (gastroesophageal reflux disease)    no meds   History of COVID-19 04/05/2020   Migraine    migraines due to pinched nerve   Pinched nerve in neck    Scoliosis     Past Surgical History:  Procedure Laterality Date   BICEPT TENODESIS Left 06/21/2020   Procedure: BICEPS TENODESIS TENOLYSIS;  Surgeon: Leandrew Koyanagi, MD;  Location: [place];  Service: Orthopedics;  Laterality: Left;   ESOPHAGUS SURGERY     as a baby   SHOULDER ARTHROSCOPY WITH BICEPS TENDON REPAIR Left 03/01/2020   Procedure: LEFT SHOULDER ARTHROSCOPY WITH BICEPS TENODESIS;  Surgeon: Leandrew Koyanagi, MD;  Location: [place];  Service: Orthopedics;  Laterality: Left;   SHOULDER ARTHROSCOPY WITH SUBACROMIAL DECOMPRESSION Right 09/01/2019   Procedure: RIGHT SHOULDER ARTHROSCOPY WITH EXTENSIVE DEBRIDEMENT AND SUBACROMIAL DECOMPRESSION;  Surgeon: Leandrew Koyanagi, MD;  Location: [place];  Service: Orthopedics;  Laterality: Right;   VENTRAL HERNIA REPAIR N/A 08/31/2018   Procedure: HERNIA REPAIR VENTRAL ADULT;  Surgeon: Olean Ree, MD;  Location: [facility];  Service: General;  Laterality: N/A;    There were no vitals filed for this visit.   Subjective Assessment - 07/17/20 0859     Subjective Woke up late. Performs exercises every day. Hurts the most in the middle of the night and when he wakes up in the morning.    Pertinent History L shoulder arthroscopy to address 7-12 o'clock labral tear and biceps tenodesis on 03/01/2020.    Limitations House hold activities;Other (comment)    Patient Stated Goals Get back to normal and return to work full-duty.    Pain Score 6     Pain Location Shoulder    Pain Orientation Left;Anterior;Proximal    Pain Descriptors / Indicators Sharp;Aching    Pain Type Surgical pain    Pain Onset 1 to 4 weeks ago              [facility] Adult PT Treatment/Exercise - 07/17/20 0001       Exercises   Exercises Shoulder      Shoulder Exercises: Supine   External Rotation AAROM;Left;20 reps    External Rotation Weight (lbs) PVC pipe    External Rotation Limitations tolerating 10 degrees  Flexion AAROM;Both;20 reps    Shoulder Flexion Weight (lbs) PVC pipe    ABduction AAROM;Left;20 reps    Shoulder ABduction Weight (lbs) PVC pipe      Shoulder Exercises: Sidelying   Other Sidelying Exercises scapular retraction x10 with manual assist      Manual Therapy   Manual Therapy Joint mobilization;Passive ROM;Soft tissue mobilization    Manual therapy comments completed separate from all other skilled interventions    Joint Mobilization anterior, posterior, inferior joint glides    Soft tissue mobilization subscap, infraspinatus, levator    Passive ROM PROM was completed for L shoulder flexion, 90d limit; abduction (elbow at 90d),  90d limit; and ER (Arm by side), 45d limit; each motion 10x; c 20 sec stretch. For flexion and abduction  the endfeel was the empty with the development. The endfeel for ER was firm with the development of pain                    PT Education - 07/17/20 0925     Education Details Discussed purpose and technique of interventions throughout session. Re-iterated protection phase of healing.    Person(s) Educated Patient    Methods Explanation    Comprehension Verbalized understanding              PT Short Term Goals - 07/01/20 0325       PT SHORT TERM GOAL #1   Title Pt will be Ind in an initial HEP    Baseline Started on eval    Status New    Target Date 07/22/20      PT SHORT TERM GOAL #2   Title Pt will voice understanding of measures to assist in the management of L shoulder pain and function    Target Date 07/22/20      PT SHORT TERM GOAL #3   Title Increase L shoulder flexion to 90d and ER to 45d    Status New    Target Date 07/22/20               PT Long Term Goals - 07/01/20 0335       PT LONG TERM GOAL #1   Title Improve FOTO to predicted value of 69%    Baseline 39%    Status New    Target Date 09/02/20      PT LONG TERM GOAL #2   Title Improve L shoulder AROM to 90% of the uninvolved R.    Status New    Target Date 09/02/20      PT LONG TERM GOAL #3   Title Increase L shoulder strength to 5/5 MMT for all motions to optimize function for home and work activites    Status New    Target Date 09/02/20      PT LONG TERM GOAL #4   Title Pt will reports L shoulder pain of 3/10 or less with daily and work related activities    Baseline 7/10    Status New    Target Date 09/02/20      PT LONG TERM GOAL #5   Title Pt will be Ind in a final HEP to maintain or progress achieved LOF    Status New    Target Date 09/02/20      Additional Long Term Goals   Additional Long Term Goals Yes      PT LONG TERM GOAL #6   Title Set work specific goals for the L shoulder as work requirements  are determined               Plan - 07/17/20 1041      Clinical Impression Statement Patient 13 minutes late for appointment stating he had just gotten out of bed. Overall patient tolerated treatment well. Patient has difficulty relaxing muscles for manual PROM activities. Soft tissue restrictions noted in parascapular muscles and trigger points addressed. Patient educated on self trigger point therapy with tennis ball in a sock against a wall. Inferior joint glides most restricted. Patient complained of pain in area of middle deltoid with PROM into abduction. Continued education on protective phase of left shoulder at this time.    Examination-Activity Limitations Bathing;Dressing;Sleep;Hygiene/Grooming;Bed Mobility;Carry;Reach Overhead;Lift    Examination-Participation Restrictions Occupation;Community Activity    Stability/Clinical Decision Making Stable/Uncomplicated    Rehab Potential Good    PT Frequency 2x / week    PT Duration 8 weeks    PT Treatment/Interventions ADLs/Self Care Home Management;Iontophoresis 68m/ml Dexamethasone;Cryotherapy;Therapeutic activities;Therapeutic exercise;Neuromuscular re-education;Patient/family education;Manual techniques;Dry needling;Vasopneumatic Device;Joint Manipulations;Electrical Stimulation;Moist Heat;Ultrasound;Taping    PT Next Visit Plan AAROM with back against wall    PT Home Exercise Plan [id number]-Written modifications have been added to the text of the pt's program; 07/17/20 - self trigger point against a wall    Consulted and Agree with Plan of Care Patient             Patient will benefit from skilled therapeutic intervention in order to improve the following deficits and impairments:  Decreased endurance,Decreased range of motion,Decreased strength,Increased edema,Impaired UE functional use,Pain  Visit Diagnosis: Muscle weakness (generalized)  Stiffness of left shoulder, not elsewhere classified  Left shoulder pain, unspecified chronicity  Biceps tendonosis of left shoulder  Abnormal  posture  Acute pain of left shoulder     Problem List Patient Active Problem List   Diagnosis Date Noted   History of Abnormal LFTs (liver function tests) 12/30/2019   Abnormal liver ultrasound 12/30/2019   Fatty liver 12/30/2019   Right flank pain 12/30/2019   Abdominal wall pain 12/30/2019   Gastroesophageal reflux disease without esophagitis 12/30/2019   Superior labrum anterior-to-posterior (SLAP) tear of left shoulder 10/01/2019   Carpal tunnel syndrome 09/08/2019   Bursitis of shoulder, right 08/31/2019   Incisional hernia, without obstruction or gangrene 05/20/2018   Opiate dependence ([place] 08/02/2013   BPamala HurryD. Hartnett-Rands, MS, PT Per DAttalla#[phone] BJeannie Done5/04/2020, 10:47 AM  [address]SP[address] [place] [address]Phone: [phone]  Fax:  [phone] Name: MKYLLIAN CLINGERMANMRN: [id number] of Birth: 208-10-1990 PHYSICAL THERAPY DISCHARGE SUMMARY  Visits from Start of Care: 4  Current functional level related to goals / functional outcomes: unknown   Remaining deficits: unknown   Education / Equipment: HEP  Patient agrees to discharge. Patient goals were not met. Patient is being discharged due to not returning since the last visit.  Allen Ralls MS, PT 10/31/20 8:22 AM

## 2020-07-18 ENCOUNTER — Encounter: Payer: No Typology Code available for payment source | Admitting: Physical Therapy

## 2020-07-19 ENCOUNTER — Ambulatory Visit (HOSPITAL_COMMUNITY): Payer: No Typology Code available for payment source

## 2020-07-19 ENCOUNTER — Ambulatory Visit: Payer: No Typology Code available for payment source | Admitting: Family Medicine

## 2020-07-20 ENCOUNTER — Encounter: Payer: No Typology Code available for payment source | Admitting: Physical Therapy

## 2020-07-21 ENCOUNTER — Encounter: Payer: Self-pay | Admitting: Orthopaedic Surgery

## 2020-07-21 ENCOUNTER — Telehealth: Payer: Self-pay

## 2020-07-21 NOTE — Telephone Encounter (Signed)
error 

## 2020-07-24 ENCOUNTER — Ambulatory Visit (HOSPITAL_COMMUNITY): Payer: No Typology Code available for payment source

## 2020-07-24 ENCOUNTER — Encounter: Payer: No Typology Code available for payment source | Admitting: Physical Therapy

## 2020-07-24 ENCOUNTER — Other Ambulatory Visit: Payer: Self-pay | Admitting: Physician Assistant

## 2020-07-24 ENCOUNTER — Encounter: Payer: Self-pay | Admitting: Orthopaedic Surgery

## 2020-07-24 ENCOUNTER — Encounter: Payer: Self-pay | Admitting: Family Medicine

## 2020-07-24 ENCOUNTER — Telehealth: Payer: Self-pay

## 2020-07-24 ENCOUNTER — Telehealth: Payer: Self-pay | Admitting: Orthopaedic Surgery

## 2020-07-24 MED ORDER — METHOCARBAMOL 750 MG PO TABS: 750.0000 mg | ORAL_TABLET | Freq: Three times a day (TID) | ORAL | 0 refills | Status: DC | PRN

## 2020-07-24 MED ORDER — TIZANIDINE HCL 4 MG PO TABS: 4.0000 mg | ORAL_TABLET | Freq: Four times a day (QID) | ORAL | 0 refills | Status: DC | PRN

## 2020-07-24 NOTE — Telephone Encounter (Signed)
Cannot refill narcotic

## 2020-07-24 NOTE — Telephone Encounter (Signed)
Sent in.  Cannot refill narcotic

## 2020-07-24 NOTE — Telephone Encounter (Signed)
If yes, please send into pharm.  

## 2020-07-24 NOTE — Telephone Encounter (Signed)
Patient called he is requesting a rx refill for hydrocodone and methocarbamol patient stated he ran out of pain medication Saturday and since then he is having a great amount of pain and cant move his shoulder  call back:[phone]

## 2020-07-24 NOTE — Telephone Encounter (Signed)
Jimmy [name] spoke to patient.

## 2020-07-24 NOTE — Telephone Encounter (Signed)
Patient called and also requesting a refill of oxycodone. Please call and send medication to pharmacy on file. Patient phone number is [phone].

## 2020-07-24 NOTE — Telephone Encounter (Signed)
Duplicate msg.   Msg already sent to [facility].

## 2020-07-24 NOTE — Telephone Encounter (Signed)
We can refill muscle relaxer if needed.

## 2020-07-25 ENCOUNTER — Other Ambulatory Visit: Payer: Self-pay | Admitting: Physician Assistant

## 2020-07-25 MED ORDER — ACETAMINOPHEN-CODEINE #3 300-30 MG PO TABS: 1.0000 | ORAL_TABLET | Freq: Four times a day (QID) | ORAL | 0 refills | Status: DC | PRN

## 2020-07-25 NOTE — Telephone Encounter (Signed)
Sent in

## 2020-07-26 ENCOUNTER — Encounter: Payer: No Typology Code available for payment source | Admitting: Physical Therapy

## 2020-07-26 ENCOUNTER — Ambulatory Visit (HOSPITAL_COMMUNITY): Payer: Self-pay

## 2020-07-27 ENCOUNTER — Other Ambulatory Visit: Payer: Self-pay | Admitting: Surgical

## 2020-07-28 ENCOUNTER — Ambulatory Visit (INDEPENDENT_AMBULATORY_CARE_PROVIDER_SITE_OTHER): Payer: No Typology Code available for payment source | Admitting: Surgical

## 2020-07-28 ENCOUNTER — Encounter: Payer: Self-pay | Admitting: Surgical

## 2020-07-28 ENCOUNTER — Encounter: Payer: No Typology Code available for payment source | Admitting: Orthopaedic Surgery

## 2020-07-28 DIAGNOSIS — M67814 Other specified disorders of tendon, left shoulder: Secondary | ICD-10-CM

## 2020-07-28 NOTE — Progress Notes (Signed)
Post-Op Visit Note   Patient: Jimmy [name]           Date of Birth: Mar 28, 1990           MRN: [id number] Visit Date: 07/28/2020 PCP: Charlott Rakes, MD   Assessment & Plan:  Chief Complaint:  Chief Complaint  Patient presents with  . Shoulder Pain   Visit Diagnoses:  1. Biceps tendonosis of left shoulder     Plan: Patient is a 30 year old male who presents following revision left open biceps tenodesis.  He states that he has been progressing and has finished physical therapy after 12 sessions.  He is functionally doing well but he does note continued pain in the front of his shoulder.  Pain mostly bothers him in the morning and late at night.  He notes particular difficulty with externally rotating and internally rotating behind his back.  He has not done any strengthening exercises.  He is currently out of work.  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  He is taking Tylenol 3 as well as ibuprofen with some relief.  He is 6 weeks out from his procedure at this point.  On exam he has excellent supination and bicep flexion strength with mild pain.  He has no crepitus that is noted with passive motion of the shoulder.  He has excellent rotator cuff strength.  Left shoulder with 45 degrees external rotation, 90 degrees abduction, 150 degrees forward flexion with internal rotation to the top of the lumbar spine.  Right shoulder with 75 degrees external rotation, 120 degrees abduction, 180 degrees forward flexion with internal rotation to the mid thoracic spine.  Incision is healing well but there was a small suture knot that had perforated through the incision and this was removed without difficulty.  No sign of infection or any dehiscence.  Axillary nerve intact with deltoid firing.  2+ radial pulse.  By his description seems that he is making improvements and continuing to improve as he gets further out from procedure.  Main complaint is lack of strength but he  has not started strengthening yet.  Recommended he continue working on physical therapy range of motion exercises and these were detailed to him with a therapy sheet that he will follow as a home exercise program.  Also recommended he very slowly improve his bicep flexion and supination strength progressively but back off if he experiences any significant pain in the shoulder.  Did not refill opioid prescription today and patient understands that is important to wean off opioids this far out from procedure.  He will take anti-inflammatories as needed.  Discontinue sling entirely.  Follow-up with Dr. Erlinda Hong or Dr. Marlou Sa in 4 weeks for clinical check prior to return to work.  Follow-Up Instructions: No follow-ups on file.   Orders:  No orders of the defined types were placed in this encounter.  No orders of the defined types were placed in this encounter.   Imaging: No results found.  PMFS History: Patient Active Problem List   Diagnosis Date Noted  . History of Abnormal LFTs (liver function tests) 12/30/2019  . Abnormal liver ultrasound 12/30/2019  . Fatty liver 12/30/2019  . Right flank pain 12/30/2019  . Abdominal wall pain 12/30/2019  . Gastroesophageal reflux disease without esophagitis 12/30/2019  . 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 Carpal tunnel syndrome 09/08/2019  . Bursitis of shoulder, right 08/31/2019  . Incisional hernia, without obstruction or gangrene  05/20/2018  . Opiate dependence ([place]) 08/02/2013   Past Medical History:  Diagnosis Date  . ADHD (attention deficit hyperactivity disorder)   . Anxiety   . Arthritis   . Cancer ([place])    skin cancer  . Chest pain 08/2018  . GERD (gastroesophageal reflux disease)    no meds  . History of COVID-19 04/05/2020  . Migraine    migraines due to pinched nerve  . Pinched nerve in neck   . Scoliosis     Family History  Problem Relation Age of Onset  . Diabetes Mother   . Kidney  cancer Mother   . Diabetes Maternal Grandmother   . Cancer Maternal Grandmother        unknown type. doing chemo  . Cancer Maternal Grandfather   . Diabetes Maternal Aunt   . Stomach cancer Neg Hx   . Pancreatic cancer Neg Hx   . Esophageal cancer Neg Hx   . Liver disease Neg Hx   . Colon cancer Neg Hx   . Rectal cancer Neg Hx     Past Surgical History:  Procedure Laterality Date  . BICEPT TENODESIS Left 06/21/2020   Procedure: BICEPS TENODESIS TENOLYSIS;  Surgeon: Leandrew Koyanagi, MD;  Location: [place];  Service: Orthopedics;  Laterality: Left;  . ESOPHAGUS SURGERY     as a baby  . SHOULDER ARTHROSCOPY WITH BICEPS TENDON REPAIR Left 03/01/2020   Procedure: LEFT SHOULDER ARTHROSCOPY WITH BICEPS TENODESIS;  Surgeon: Leandrew Koyanagi, MD;  Location: [place];  Service: Orthopedics;  Laterality: Left;  . SHOULDER ARTHROSCOPY WITH SUBACROMIAL DECOMPRESSION Right 09/01/2019   Procedure: RIGHT SHOULDER ARTHROSCOPY WITH EXTENSIVE DEBRIDEMENT AND SUBACROMIAL DECOMPRESSION;  Surgeon: Leandrew Koyanagi, MD;  Location: [place];  Service: Orthopedics;  Laterality: Right;  . VENTRAL HERNIA REPAIR N/A 08/31/2018   Procedure: HERNIA REPAIR VENTRAL ADULT;  Surgeon: Olean Ree, MD;  Location: [facility];  Service: General;  Laterality: N/A;   Social History   Occupational History  . Not on file  Tobacco Use  . Smoking status: Current Every Day Smoker    Packs/day: 1.00    Years: 15.00    Pack years: 15.00    Types: Cigarettes  . Smokeless tobacco: Former Network engineer  . Vaping Use: Former  Substance and Sexual Activity  . Alcohol use: Not Currently  . Drug use: Not Currently  . Sexual activity: Yes

## 2020-07-31 ENCOUNTER — Encounter: Payer: No Typology Code available for payment source | Admitting: Physical Therapy

## 2020-08-01 ENCOUNTER — Ambulatory Visit: Payer: No Typology Code available for payment source | Admitting: Family Medicine

## 2020-08-01 ENCOUNTER — Ambulatory Visit: Payer: No Typology Code available for payment source | Admitting: Physician Assistant

## 2020-08-02 ENCOUNTER — Ambulatory Visit: Payer: No Typology Code available for payment source | Admitting: Orthopaedic Surgery

## 2020-08-02 ENCOUNTER — Encounter: Payer: No Typology Code available for payment source | Admitting: Physical Therapy

## 2020-08-03 ENCOUNTER — Encounter: Payer: Self-pay | Admitting: Orthopaedic Surgery

## 2020-08-04 ENCOUNTER — Other Ambulatory Visit: Payer: Self-pay | Admitting: Physician Assistant

## 2020-08-04 MED ORDER — ACETAMINOPHEN-CODEINE #3 300-30 MG PO TABS: 1.0000 | ORAL_TABLET | Freq: Four times a day (QID) | ORAL | 0 refills | Status: DC | PRN

## 2020-08-04 NOTE — Telephone Encounter (Signed)
Sent in

## 2020-08-07 ENCOUNTER — Encounter: Payer: Self-pay | Admitting: Family Medicine

## 2020-08-07 ENCOUNTER — Encounter: Payer: No Typology Code available for payment source | Admitting: Physical Therapy

## 2020-08-09 ENCOUNTER — Encounter: Payer: No Typology Code available for payment source | Admitting: Physical Therapy

## 2020-08-15 ENCOUNTER — Encounter: Payer: Self-pay | Admitting: Orthopaedic Surgery

## 2020-08-15 NOTE — Telephone Encounter (Signed)
Can you send in

## 2020-08-16 ENCOUNTER — Other Ambulatory Visit: Payer: Self-pay | Admitting: Physician Assistant

## 2020-08-16 MED ORDER — ACETAMINOPHEN-CODEINE #3 300-30 MG PO TABS: 1.0000 | ORAL_TABLET | Freq: Four times a day (QID) | ORAL | 0 refills | Status: DC | PRN

## 2020-08-16 NOTE — Telephone Encounter (Signed)
Sent in

## 2020-08-17 ENCOUNTER — Encounter: Payer: No Typology Code available for payment source | Admitting: Physical Therapy

## 2020-08-21 ENCOUNTER — Encounter: Payer: No Typology Code available for payment source | Admitting: Physical Therapy

## 2020-08-23 ENCOUNTER — Encounter: Payer: No Typology Code available for payment source | Admitting: Physical Therapy

## 2020-08-24 ENCOUNTER — Encounter: Payer: Self-pay | Admitting: Orthopedic Surgery

## 2020-08-29 ENCOUNTER — Other Ambulatory Visit: Payer: Self-pay

## 2020-08-29 MED ORDER — AIMOVIG 70 MG/ML ~~LOC~~ SOAJ: 70.0000 mg | SUBCUTANEOUS | 1 refills | Status: DC

## 2020-09-04 ENCOUNTER — Encounter: Payer: Self-pay | Admitting: Family Medicine

## 2020-09-04 ENCOUNTER — Other Ambulatory Visit: Payer: Self-pay | Admitting: Family Medicine

## 2020-09-04 MED ORDER — TIZANIDINE HCL 4 MG PO TABS: 4.0000 mg | ORAL_TABLET | Freq: Four times a day (QID) | ORAL | 2 refills | Status: DC | PRN

## 2020-09-05 ENCOUNTER — Ambulatory Visit: Payer: No Typology Code available for payment source | Admitting: Dermatology

## 2020-09-14 ENCOUNTER — Encounter: Payer: Self-pay | Admitting: Orthopedic Surgery

## 2020-09-26 ENCOUNTER — Ambulatory Visit (INDEPENDENT_AMBULATORY_CARE_PROVIDER_SITE_OTHER): Payer: 59 | Admitting: Orthopaedic Surgery

## 2020-09-26 ENCOUNTER — Other Ambulatory Visit: Payer: Self-pay

## 2020-09-26 DIAGNOSIS — M67814 Other specified disorders of tendon, left shoulder: Secondary | ICD-10-CM | POA: Diagnosis not present

## 2020-09-26 NOTE — Progress Notes (Signed)
Post-Op Visit Note   Patient: Jimmy [name]           Date of Birth: 23-Nov-1990           MRN: [id number] Visit Date: 09/26/2020 PCP: Charlott Rakes, MD   Assessment & Plan:  Chief Complaint:  Chief Complaint  Patient presents with   Left Shoulder - Follow-up   Visit Diagnoses:  1. Biceps tendonosis of left shoulder     Plan: Mr. Nichol is 3 months and 6 days status post open left biceps tenodesis.  He has finished physical therapy.  Currently his claim is under review for [employer] and he has a hearing in August.  He has been working during this time.  He reports mild baseline pain that is worse with exertion and activity and use of the left arm.  Left shoulder shows a fully healed surgical scar.  Normal muscular contour.  Internal rotation to L1 for flexion to 170, external rotation to 45.  Manual muscle testing of rotator cuff is strong.  Negative Speed.  From my standpoint the patient is recovering well.  For now he will continue to do his job and he will follow-up with [place] once he receives a decision on whether his claim will be accepted or denied by [employer]  Follow-Up Instructions: No follow-ups on file.   Orders:  No orders of the defined types were placed in this encounter.  No orders of the defined types were placed in this encounter.   Imaging: No results found.  PMFS History: Patient Active Problem List   Diagnosis Date Noted   History of Abnormal LFTs (liver function tests) 12/30/2019   Abnormal liver ultrasound 12/30/2019   Fatty liver 12/30/2019   Right flank pain 12/30/2019   Abdominal wall pain 12/30/2019   Gastroesophageal reflux disease without esophagitis 12/30/2019   Superior labrum anterior-to-posterior (SLAP) tear of left shoulder 10/01/2019   Carpal tunnel syndrome 09/08/2019   Bursitis of shoulder, right 08/31/2019   Incisional hernia, without obstruction or gangrene 05/20/2018   Opiate dependence ([place]) 08/02/2013   Past  Medical History:  Diagnosis Date   ADHD (attention deficit hyperactivity disorder)    Anxiety    Arthritis    Cancer ([place])    skin cancer   Chest pain 08/2018   GERD (gastroesophageal reflux disease)    no meds   History of COVID-19 04/05/2020   Migraine    migraines due to pinched nerve   Pinched nerve in neck    Scoliosis     Family History  Problem Relation Age of Onset   Diabetes Mother    Kidney cancer Mother    Diabetes Maternal Grandmother    Cancer Maternal Grandmother        unknown type. doing chemo   Cancer Maternal Grandfather    Diabetes Maternal Aunt    Stomach cancer Neg Hx    Pancreatic cancer Neg Hx    Esophageal cancer Neg Hx    Liver disease Neg Hx    Colon cancer Neg Hx    Rectal cancer Neg Hx     Past Surgical History:  Procedure Laterality Date   BICEPT TENODESIS Left 06/21/2020   Procedure: BICEPS TENODESIS TENOLYSIS;  Surgeon: Leandrew Koyanagi, MD;  Location: [place];  Service: Orthopedics;  Laterality: Left;   ESOPHAGUS SURGERY     as a baby   SHOULDER ARTHROSCOPY WITH BICEPS TENDON REPAIR Left 03/01/2020   Procedure: LEFT SHOULDER ARTHROSCOPY WITH  BICEPS TENODESIS;  Surgeon: Leandrew Koyanagi, MD;  Location: [place];  Service: Orthopedics;  Laterality: Left;   SHOULDER ARTHROSCOPY WITH SUBACROMIAL DECOMPRESSION Right 09/01/2019   Procedure: RIGHT SHOULDER ARTHROSCOPY WITH EXTENSIVE DEBRIDEMENT AND SUBACROMIAL DECOMPRESSION;  Surgeon: Leandrew Koyanagi, MD;  Location: [place];  Service: Orthopedics;  Laterality: Right;   VENTRAL HERNIA REPAIR N/A 08/31/2018   Procedure: HERNIA REPAIR VENTRAL ADULT;  Surgeon: Olean Ree, MD;  Location: [facility];  Service: General;  Laterality: N/A;   Social History   Occupational History   Not on file  Tobacco Use   Smoking status: Every Day    Packs/day: 1.00    Years: 15.00    Pack years: 15.00    Types: Cigarettes   Smokeless tobacco: Former  Brewing technologist Use: Former  Substance and Sexual Activity   Alcohol use: Not Currently   Drug use: Not Currently   Sexual activity: Yes

## 2020-10-09 ENCOUNTER — Telehealth: Payer: Self-pay | Admitting: Orthopaedic Surgery

## 2020-10-09 ENCOUNTER — Other Ambulatory Visit: Payer: Self-pay | Admitting: Family Medicine

## 2020-10-09 ENCOUNTER — Encounter: Payer: Self-pay | Admitting: Family Medicine

## 2020-10-09 DIAGNOSIS — F3161 Bipolar disorder, current episode mixed, mild: Secondary | ICD-10-CM

## 2020-10-09 MED ORDER — QUETIAPINE FUMARATE 100 MG PO TABS: 100.0000 mg | ORAL_TABLET | Freq: Every day | ORAL | 0 refills | Status: DC

## 2020-10-09 NOTE — Telephone Encounter (Signed)
yes

## 2020-10-09 NOTE — Telephone Encounter (Signed)
Emailed

## 2020-10-09 NOTE — Progress Notes (Unsigned)
Key: [id number] pending approval aimovig '70mg'$ /ml sent clinicals. Fax sent to the plan Your PA has been faxed to the plan as a paper copy. Please contact the plan directly if you haven't received a determination in a typical timeframe.  You will be notified of the determination electronically and via fax. How do I know if the plan approved the PA?  Add Reminder to your Dashboard Remind me in:  5 business days Contact plan to follow up on [facility]

## 2020-10-09 NOTE — Telephone Encounter (Signed)
Pt called asking for a work note releasing him back with no restrictions. Pt would like to have this emailed.   [email or web address]  [phone]

## 2020-10-10 NOTE — Progress Notes (Unsigned)
No prior needed. Covered under his medical. Sent to scan.

## 2020-10-11 ENCOUNTER — Telehealth: Payer: Self-pay

## 2020-10-11 ENCOUNTER — Other Ambulatory Visit: Payer: Self-pay

## 2020-10-11 NOTE — Telephone Encounter (Signed)
Will mail out today

## 2020-10-11 NOTE — Telephone Encounter (Signed)
Patient called he is requesting the note that was written 7/25 to be sent to him via mail call back:[phone]

## 2020-10-16 NOTE — Progress Notes (Signed)
NEUROLOGY FOLLOW UP OFFICE NOTE  Jimmy [name] [id number]  Assessment/Plan:   Migraine without aura, without status migrainosus, not intractable - improved but I think we can optimize efficacy   1. Increase Aimovig to '140mg'$  every month 2.  Limit use of pain relievers to no more than 2 days out of week to prevent risk of rebound or medication-overuse headache. 4.  Keep headache diary 5.  Follow up in 9 months.  Subjective:  Jimmy [name] is a 30 year old male with Bipolar disorder who follows up for migraines.   UPDATE: Intensity:  Mild Duration: a couple of hours Frequency: off and on everyday beginning 7 to 10 days prior to next Aimovig injection    Current NSAIDS:  Unable to take due to GI ulcers Current analgesics:  Tylenol #3 (shoulder) Current triptans: none Current ergotamine:  none Current anti-emetic:  none Current muscle relaxants: tizanidine  Current anti-anxiolytic:  none Current sleep aide:  none Current Antihypertensive medications:  none Current Antidepressant/antipsychotic medications:  Seroquel '100mg'$  at bedtime Current Anticonvulsant medications:  none Current anti-CGRP:  Aimovig '70mg'$  every 28 days Current Vitamins/Herbal/Supplements:  none Current Antihistamines/Decongestants:  none Other therapy:  none Hormone/birth control:  none   Caffeine:  1 cup of [name] daily; no soda Alcohol:  no Smoker:  1 ppd Diet:  No soda.  8 bottles of water daily. Exercise:  No routine exercise Depression:  yes; Anxiety:  yes Other pain:  shoulder pain s/p shoulder surgery - still hurts Sleep:  Good with quetiapine   HISTORY:  He has history of headaches since 30 years old.  They are left or right-sided pounding/pressure/stabbing pain associated with photophobia, phonophobia but not nausea, vomiting, visual disturbance, paresthesias or unilateral numbness or weakness.  He reports that they are triggered by a "pinched nerve in my neck".  He has associated  bilateral neck and shoulder pain.  Cervical X-ray from 05/26/2013 personally reviewed did show mild disc space narrowing at C5-6 but otherwise unremarkable.  No radicular pain, numbness or weakness in the upper extremities.  They are typically persistent, lasting weeks to months.  They may be severe for 2 week periods.  It is usually 4-5/10 intensity but 8/10 when severe.  3 to 4 months he is headache-free between episodes.   Nothing relieves the headache.    01/08/2011 CT BRAIN WO:  No acute intracranial abnormality 07/10/2019 MRI CERVICAL SPINE: right eccentric disc bulge at C3-4 causing mild canal and right C4 foraminal stenosis, as well as mild right C3 and C5 foraminal stenosis.     Past NSAIDS:  Unable to take due to GI ulcers Past analgesics:  Tylenol Past abortive triptans:  Sumatriptan '50mg'$ , rizatriptan '10mg'$  Past abortive ergotamine:  none Past muscle relaxants:  Flexeril, Robaxin Past anti-emetic:  none Past antihypertensive medications:  none Past antidepressant/antipsychotic medications: amitriptyline '10mg'$  (caused metallic taste);  nortriptyline (bad taste); quetiapine '100mg'$  at bedtime Past anticonvulsant medications:  topiramate '50mg'$  twice daily (caused metallic taste), gabapentin '300mg'$  TID Past anti-CGRP:  Ubrelvy (effective but not covered by insurance) Past vitamins/Herbal/Supplements:  none Past antihistamines/decongestants:  none Other past therapies:  none     Family history of headache:  Mother, aunt, sister  PAST MEDICAL HISTORY: Past Medical History:  Diagnosis Date   ADHD (attention deficit hyperactivity disorder)    Anxiety    Arthritis    Cancer ([place])    skin cancer   Chest pain 08/2018   GERD (gastroesophageal reflux disease)    no meds  History of COVID-19 04/05/2020   Migraine    migraines due to pinched nerve   Pinched nerve in neck    Scoliosis     MEDICATIONS: Current Outpatient Medications on File Prior to Visit  Medication Sig Dispense  Refill   acetaminophen-codeine (TYLENOL #3) 300-30 MG tablet Take 1 tablet by mouth every 6 (six) hours as needed for moderate pain. 30 tablet 0   celecoxib (CELEBREX) 100 MG capsule Take 1 capsule (100 mg total) by mouth 2 (two) times daily. 60 capsule 0   cetirizine (ZYRTEC) 10 MG tablet Take 1 tablet (10 mg total) by mouth daily. 30 tablet 1   Erenumab-aooe (AIMOVIG) 70 MG/ML SOAJ Inject 70 mg into the skin every 28 (twenty-eight) days. 1 mL 1   HYDROcodone-acetaminophen (NORCO) 5-325 MG tablet Take 1-2 tablets by mouth 2 (two) times daily as needed. 20 tablet 0   ISOtretinoin (ACCUTANE) 30 MG capsule Take 1 capsule (30 mg total) by mouth daily. 30 capsule 0   oxyCODONE-acetaminophen (PERCOCET) 5-325 MG tablet Take 1-2 tablets by mouth every 8 (eight) hours as needed for severe pain. 30 tablet 0   oxyCODONE-acetaminophen (PERCOCET) 5-325 MG tablet Take 1 tablet by mouth every 8 (eight) hours as needed. 30 tablet 0   oxyCODONE-acetaminophen (PERCOCET) 5-325 MG tablet Take 1 tablet by mouth every 8 (eight) hours as needed. 30 tablet 0   QUEtiapine (SEROQUEL) 100 MG tablet Take 1 tablet (100 mg total) by mouth at bedtime. 30 tablet 0   tiZANidine (ZANAFLEX) 4 MG tablet Take 1 tablet (4 mg total) by mouth every 6 (six) hours as needed for muscle spasms. 30 tablet 2   Ubrogepant (UBRELVY) 100 MG TABS Take 1 tablet by mouth as needed (May repeat 1 tablet in 2 hours.  Maximum 2 tablets in 24 hours.). 10 tablet 5   [DISCONTINUED] famotidine (PEPCID) 40 MG tablet Take 1 tablet (40 mg total) by mouth daily. 90 tablet 1   Current Facility-Administered Medications on File Prior to Visit  Medication Dose Route Frequency Provider Last Rate Last Admin   triamcinolone acetonide (KENALOG) 10 MG/ML injection 20 mg  20 mg Other Once Sheffield, Kelli R, PA-C        ALLERGIES: Allergies  Allergen Reactions   Cyclobenzaprine Hives and Nausea Only   Depakote [Divalproex Sodium] Other (See Comments)    "made him  crazy"   Ritalin [Methylphenidate Hcl] Other (See Comments)    Increased hyper activeness    Naproxen Nausea Only    Acid reflux   Other Other (See Comments)    [facility]-pt states skin became reddened and he tasted metal (08-2018)   Tramadol Other (See Comments)    Acid reflux    FAMILY HISTORY: Family History  Problem Relation Age of Onset   Diabetes Mother    Kidney cancer Mother    Diabetes Maternal Grandmother    Cancer Maternal Grandmother        unknown type. doing chemo   Cancer Maternal Grandfather    Diabetes Maternal Aunt    Stomach cancer Neg Hx    Pancreatic cancer Neg Hx    Esophageal cancer Neg Hx    Liver disease Neg Hx    Colon cancer Neg Hx    Rectal cancer Neg Hx       Objective:  Blood pressure 140/76, pulse 86, weight 176 lb 8 oz (80.1 kg), SpO2 98 %. General: No acute distress.  Patient appears well-groomed.   Head:  Normocephalic/atraumatic Eyes:  Fundi  examined but not visualized Neck: supple, no paraspinal tenderness, full range of motion Heart:  Regular rate and rhythm Lungs:  Clear to auscultation bilaterally Back: No paraspinal tenderness Neurological Exam: alert and oriented to person, place, and time.  Speech fluent and not dysarthric, language intact.  CN II-XII intact. Bulk and tone normal, muscle strength 5/5 throughout.  Sensation to light touch intact.  Deep tendon reflexes 2+ throughout, toes downgoing.  Finger to nose testing intact.  Gait normal, Romberg negative.   Jimmy Clines, DO  CC: Jimmy Rakes, MD

## 2020-10-17 ENCOUNTER — Ambulatory Visit: Payer: No Typology Code available for payment source | Admitting: Family Medicine

## 2020-10-17 ENCOUNTER — Encounter: Payer: Self-pay | Admitting: Neurology

## 2020-10-17 ENCOUNTER — Other Ambulatory Visit: Payer: Self-pay

## 2020-10-17 ENCOUNTER — Ambulatory Visit (INDEPENDENT_AMBULATORY_CARE_PROVIDER_SITE_OTHER): Payer: 59 | Admitting: Neurology

## 2020-10-17 VITALS — BP 140/76 | HR 86 | Wt 176.5 lb

## 2020-10-17 DIAGNOSIS — G43009 Migraine without aura, not intractable, without status migrainosus: Secondary | ICD-10-CM | POA: Diagnosis not present

## 2020-10-17 MED ORDER — AIMOVIG 140 MG/ML ~~LOC~~ SOAJ: 140.0000 mg | SUBCUTANEOUS | 5 refills | Status: DC

## 2020-10-17 NOTE — Patient Instructions (Signed)
Increase Aimovig to '140mg'$  every 28 days Limit use of pain relievers to no more than 2 days out of week to prevent risk of rebound or medication-overuse headache. Keep headache diary Follow up 9 months

## 2020-10-25 ENCOUNTER — Emergency Department (HOSPITAL_COMMUNITY): Payer: 59

## 2020-10-25 ENCOUNTER — Other Ambulatory Visit: Payer: Self-pay

## 2020-10-25 ENCOUNTER — Emergency Department (HOSPITAL_COMMUNITY)
Admission: EM | Admit: 2020-10-25 | Discharge: 2020-10-25 | Disposition: A | Discharge status: Home / Self Care | Payer: 59 | Attending: Emergency Medicine | Admitting: Emergency Medicine

## 2020-10-25 ENCOUNTER — Encounter (HOSPITAL_COMMUNITY): Payer: Self-pay

## 2020-10-25 DIAGNOSIS — M25512 Pain in left shoulder: Secondary | ICD-10-CM | POA: Diagnosis not present

## 2020-10-25 DIAGNOSIS — Z79899 Other long term (current) drug therapy: Secondary | ICD-10-CM | POA: Diagnosis not present

## 2020-10-25 DIAGNOSIS — F17210 Nicotine dependence, cigarettes, uncomplicated: Secondary | ICD-10-CM | POA: Diagnosis not present

## 2020-10-25 DIAGNOSIS — M542 Cervicalgia: Secondary | ICD-10-CM | POA: Diagnosis present

## 2020-10-25 DIAGNOSIS — R519 Headache, unspecified: Secondary | ICD-10-CM | POA: Insufficient documentation

## 2020-10-25 DIAGNOSIS — F909 Attention-deficit hyperactivity disorder, unspecified type: Secondary | ICD-10-CM | POA: Diagnosis not present

## 2020-10-25 DIAGNOSIS — R0781 Pleurodynia: Secondary | ICD-10-CM | POA: Diagnosis not present

## 2020-10-25 IMAGING — DX DG RIBS W/ CHEST 3+V*R*
3 series · 3 of 3 positions shown · non-contrast
Comparison: Chest radiograph [DATE]

CLINICAL DATA: Pain along fifth and sixth ribs common mid axilla

EXAM:
RIGHT RIBS AND CHEST - 3+ VIEW

[chest pa]
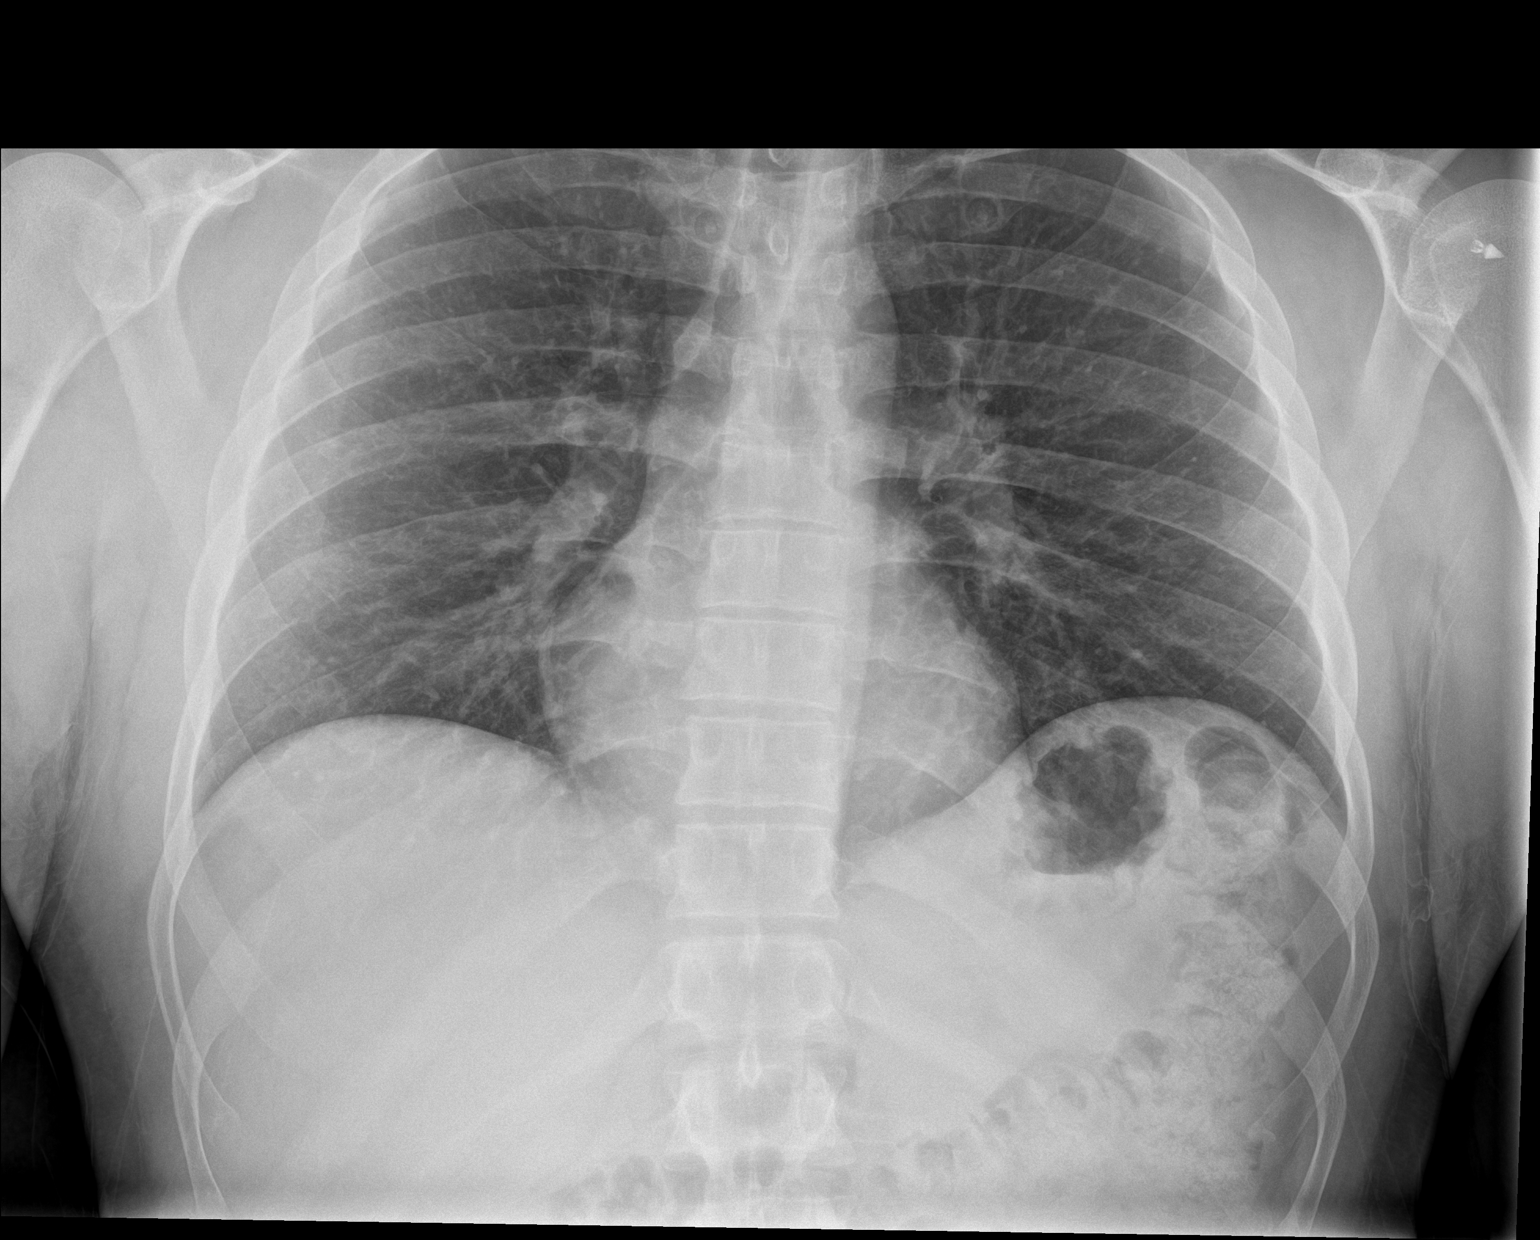

[rib pa]
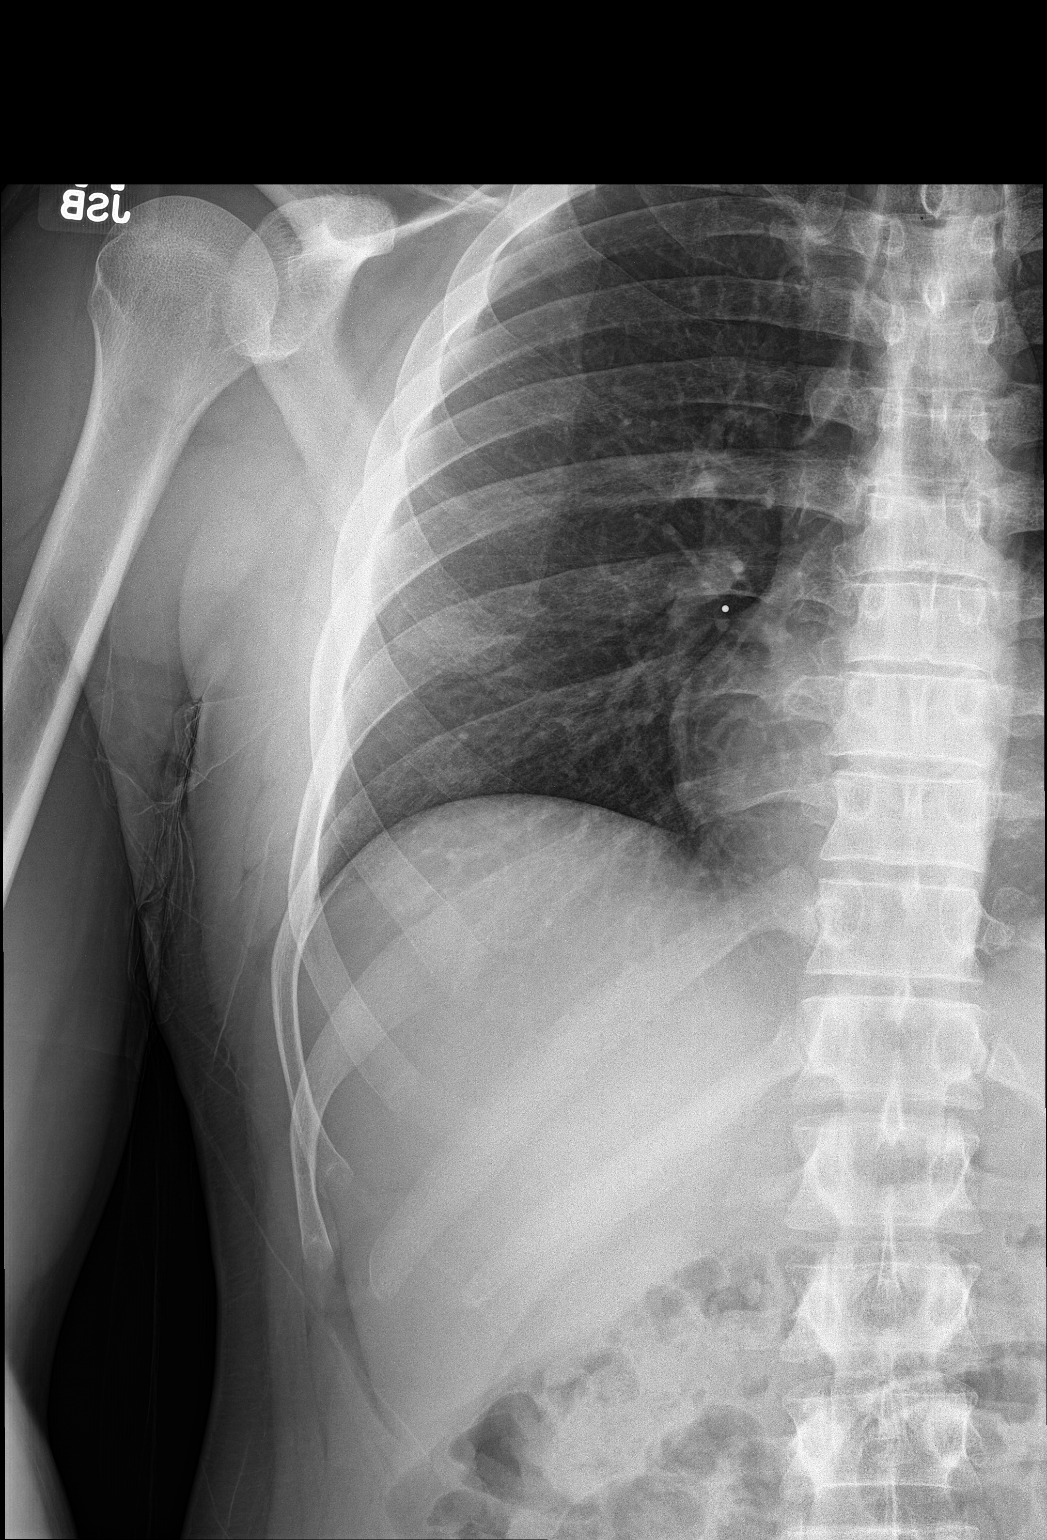

[rib obl]
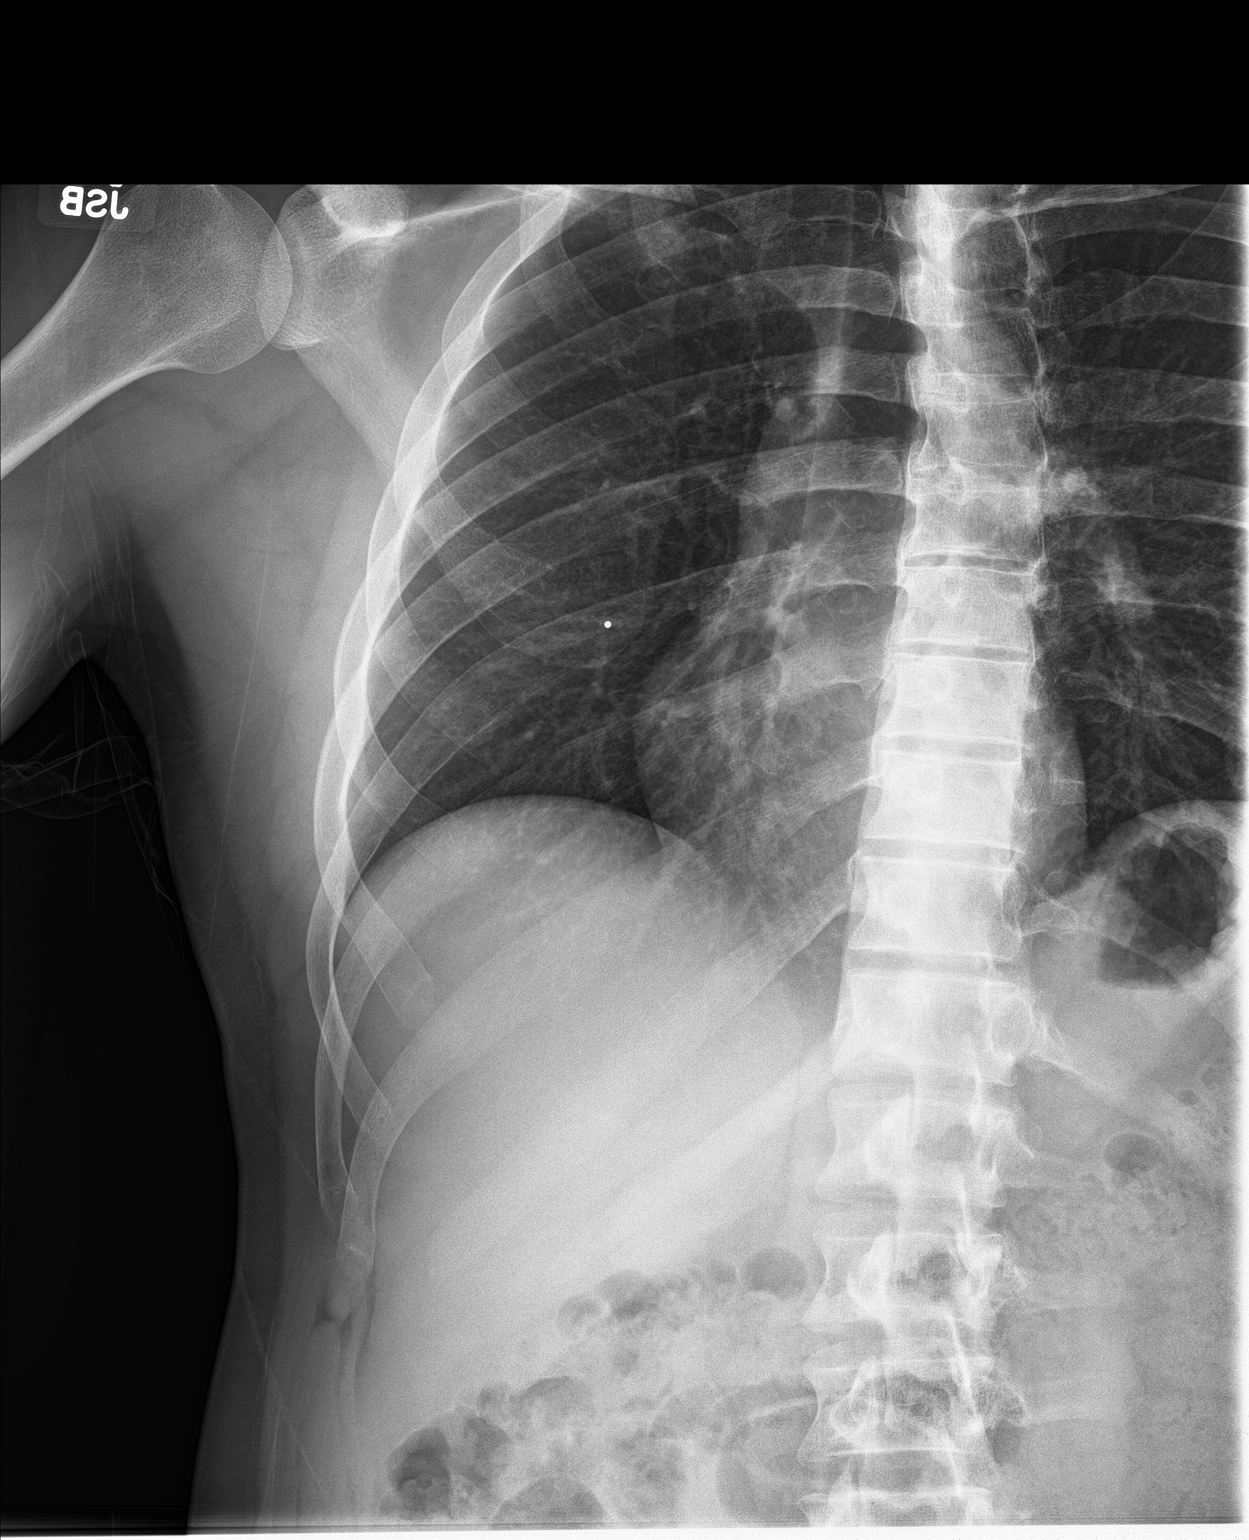

[3 of 3 positions shown; findings below may reference images not displayed]

FINDINGS: The cardiomediastinal silhouette is normal.

The lungs are clear, without focal consolidation or pulmonary edema.
There is no pleural effusion or pneumothorax.

No rib fracture is identified. A suture anchor in the left shoulder
is noted.
IMPRESSION: No rib fracture identified.

## 2020-10-25 IMAGING — CT CT HEAD W/O CM
3 series · 14 of 47 positions shown, 16 images · non-contrast
Comparison: None.

CLINICAL DATA: Head trauma

EXAM:
CT HEAD WITHOUT CONTRAST
TECHNIQUE: Contiguous axial images were obtained from the base of the skull
through the vertex without intravenous contrast.

[Series 2: head w o · axial · 0.43mm/px · z∈[+41,+171]mm · 8 of 32 slices shown, 10 images]
[im 3/32  brain]
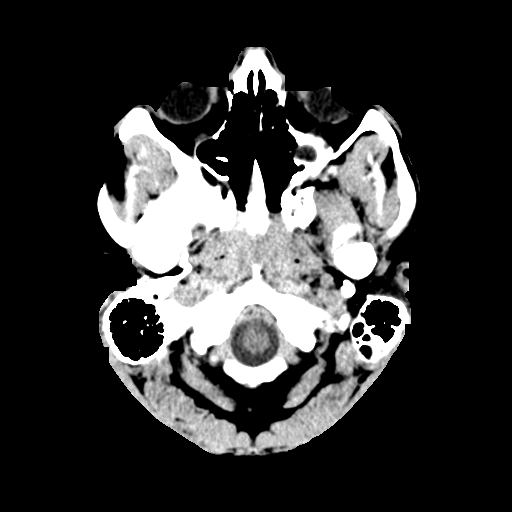
[im 3/32  bone]
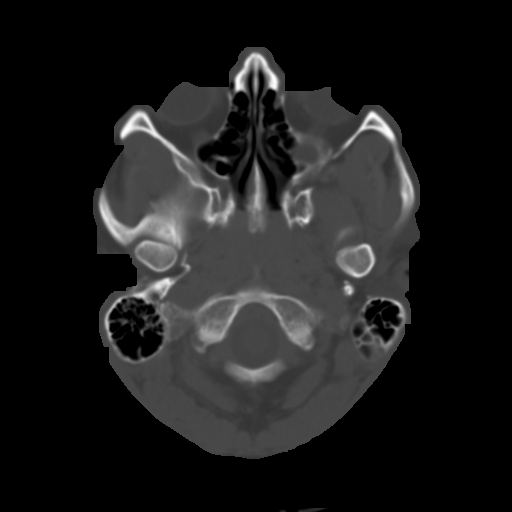
[im 7/32  brain]
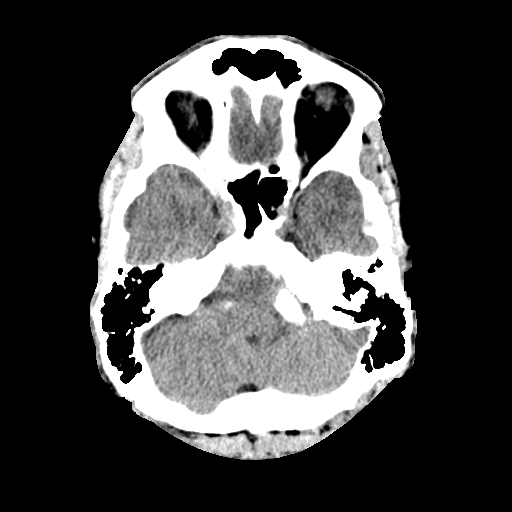
[im 10/32  brain]
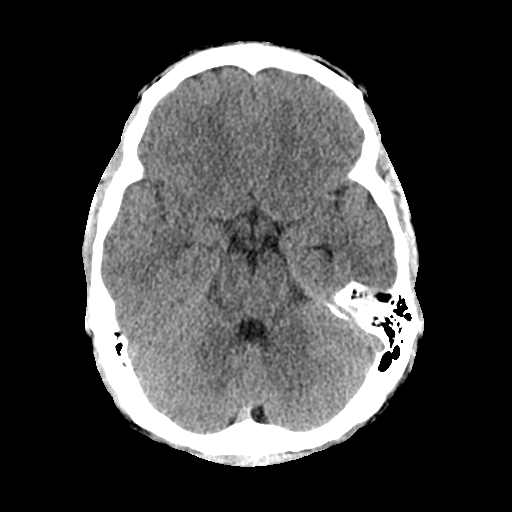
[im 14/32  brain]
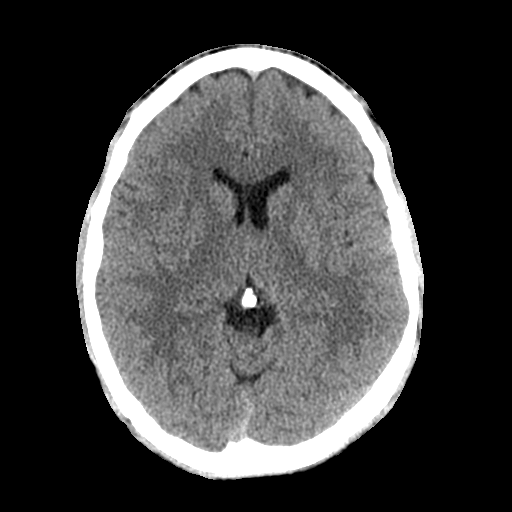
[im 18/32  brain]
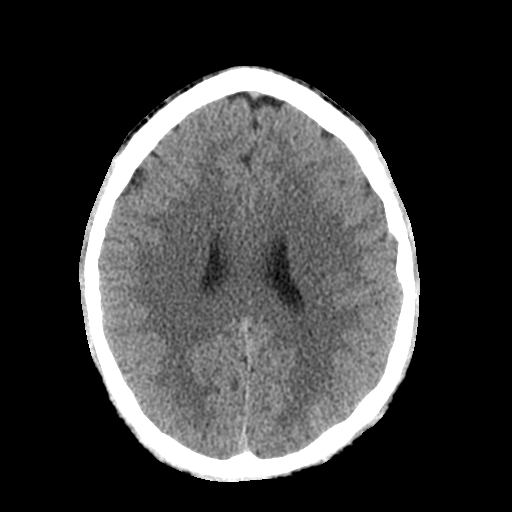
[im 18/32  bone]
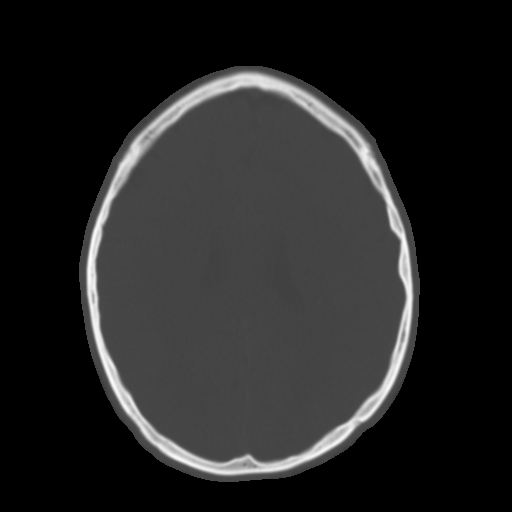
[im 22/32  brain]
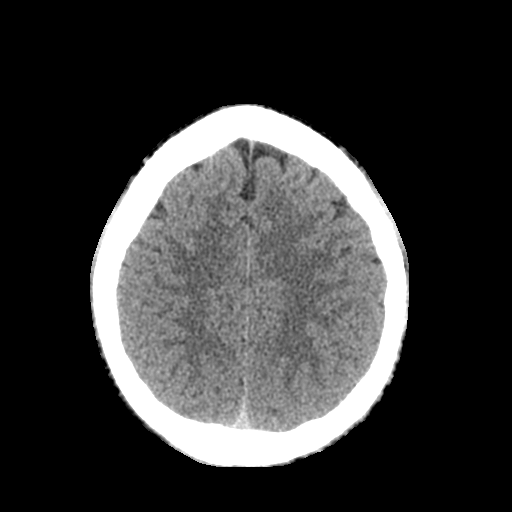
[im 25/32  brain]
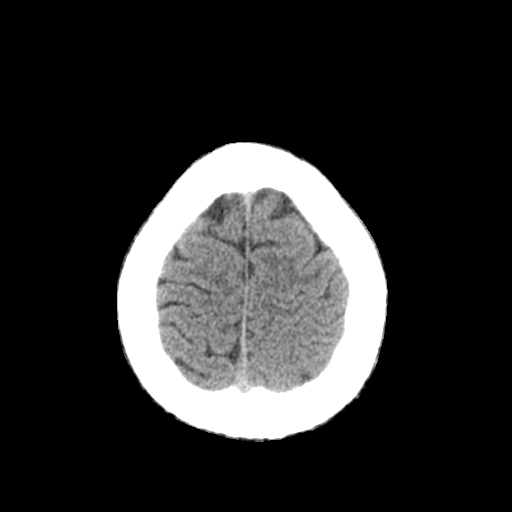
[im 29/32  brain]
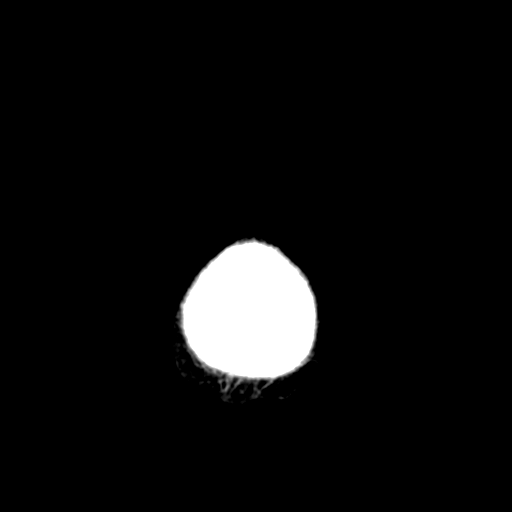

[Series 4: coronal soft · coronal · 0.31mm/px · 3 of 72 slices shown]
[im 24/72  brain]
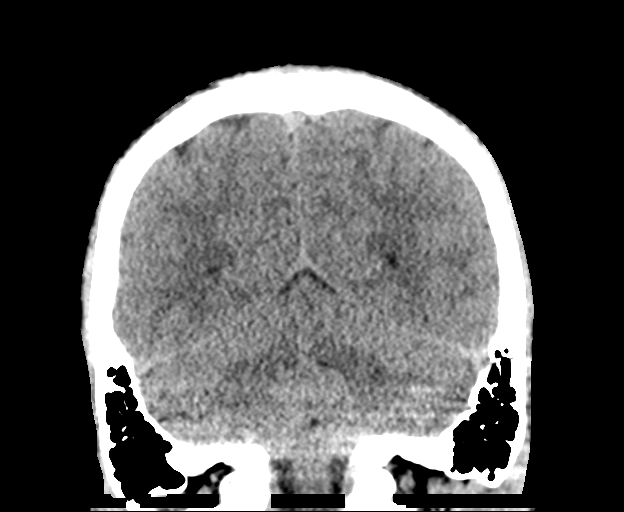
[im 32/72  brain]
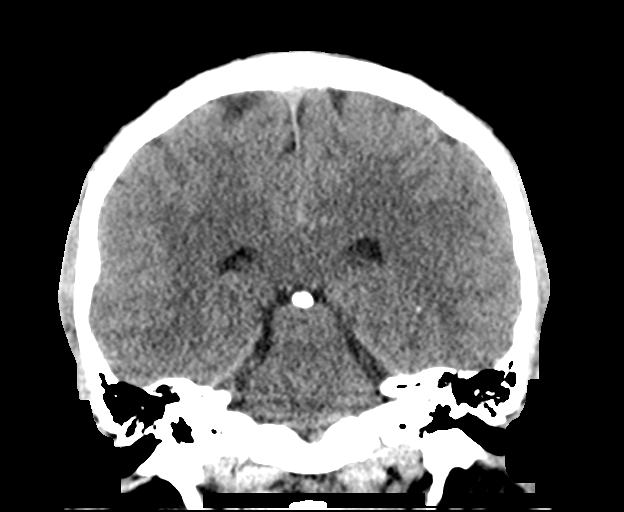
[im 40/72  brain]
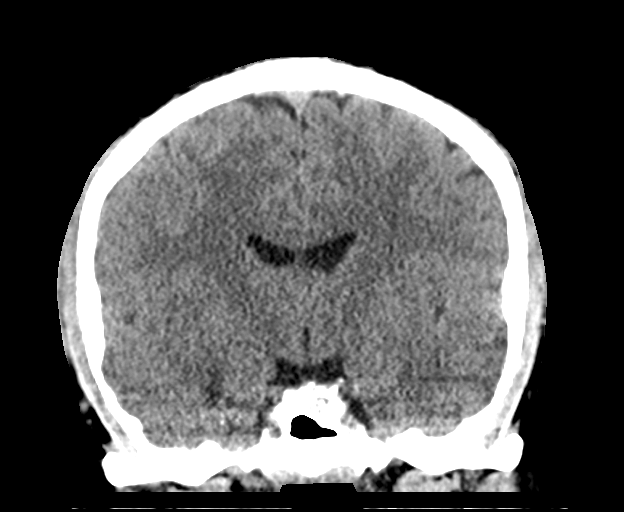

[Series 5: sagittal soft · sagittal · 0.31mm/px · 3 of 58 slices shown]
[im 20/58  brain]
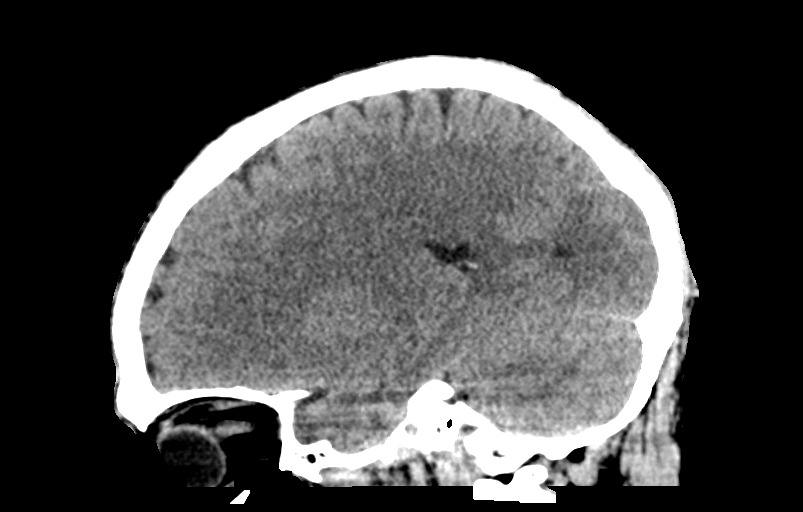
[im 29/58  brain]
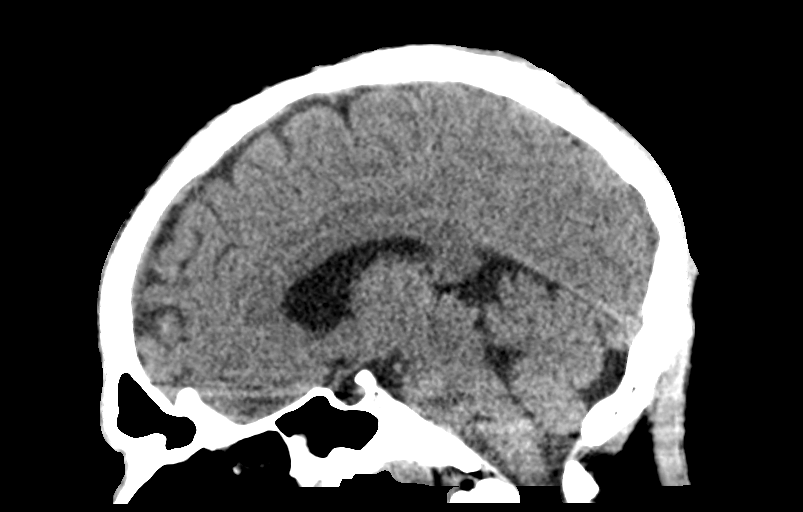
[im 39/58  brain]
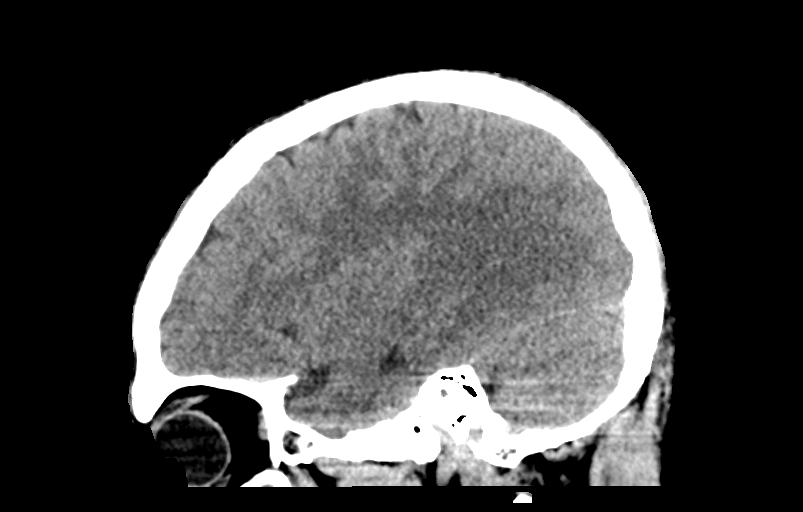

[14 of 47 positions shown; findings below may reference images not displayed]

FINDINGS: Brain: There is no acute intracranial hemorrhage, extra-axial fluid
collection, or infarct. The ventricles are not enlarged. There is no
mass lesion. There is no midline shift.

Vascular: No hyperdense vessel or unexpected calcification.

Skull: There is now calvarial fracture. There is no suspicious
osseous lesion.

Sinuses/Orbits: There is near complete opacification of the imaged
portion of the left maxillary sinus. There is mild mucosal
thickening in the right sphenoid sinus. The mastoid air cells are
clear. The globes and orbits are unremarkable.

Other: None.
IMPRESSION: 1. No acute intracranial hemorrhage or calvarial fracture.
2. Near complete opacification of the left maxillary sinus.

## 2020-10-25 IMAGING — CT CT CERVICAL SPINE W/O CM
3 of 4 series · 11 of 33 positions shown, 13 images · non-contrast
Comparison: None.

CLINICAL DATA: Neck trauma, midline tenderness

EXAM:
CT CERVICAL SPINE WITHOUT CONTRAST
TECHNIQUE: Multidetector CT imaging of the cervical spine was performed without
intravenous contrast. Multiplanar CT image reconstructions were also
generated.

[Series 5: sagittal bone · sagittal · 0.23mm/px · 5 of 61 slices shown, 6 images]
[im 21/61  bone]
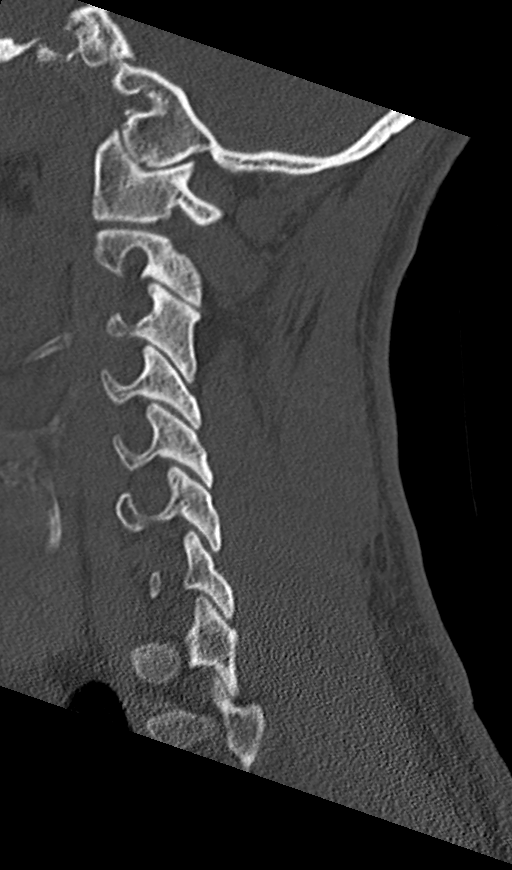
[im 26/61  bone]
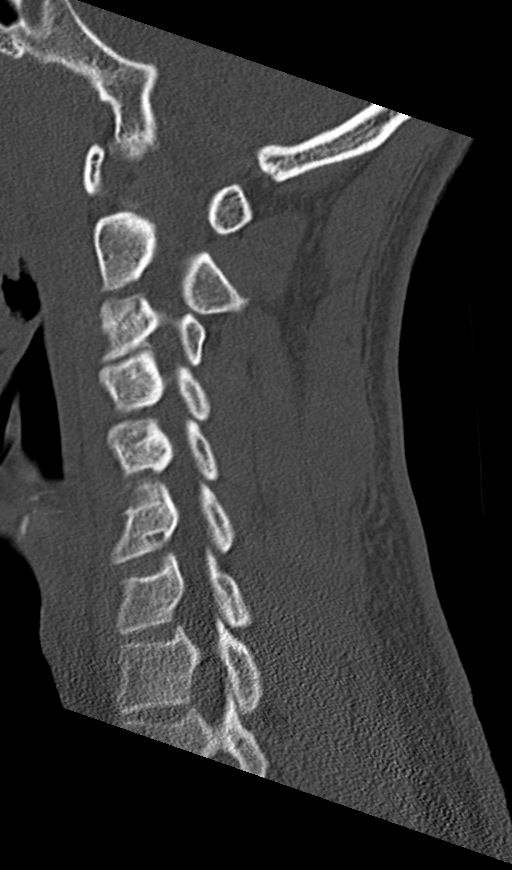
[im 31/61  soft-tissue]
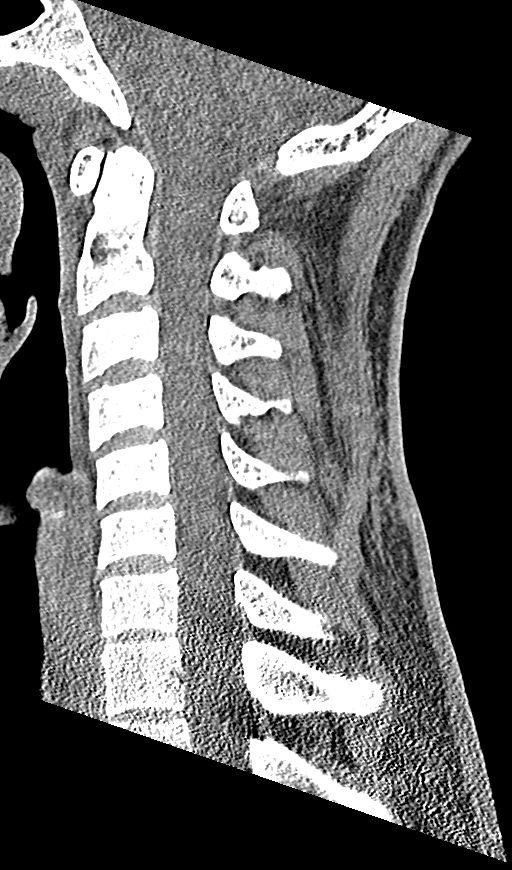
[im 31/61  bone]
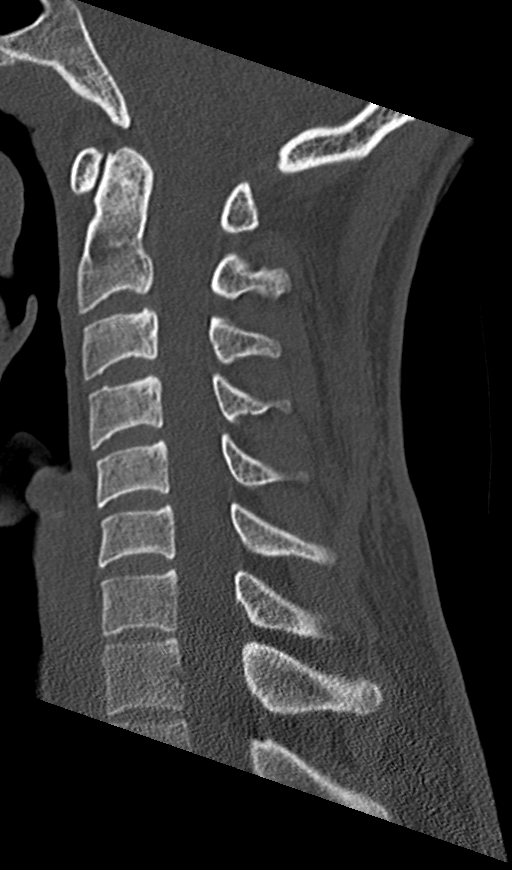
[im 36/61  bone]
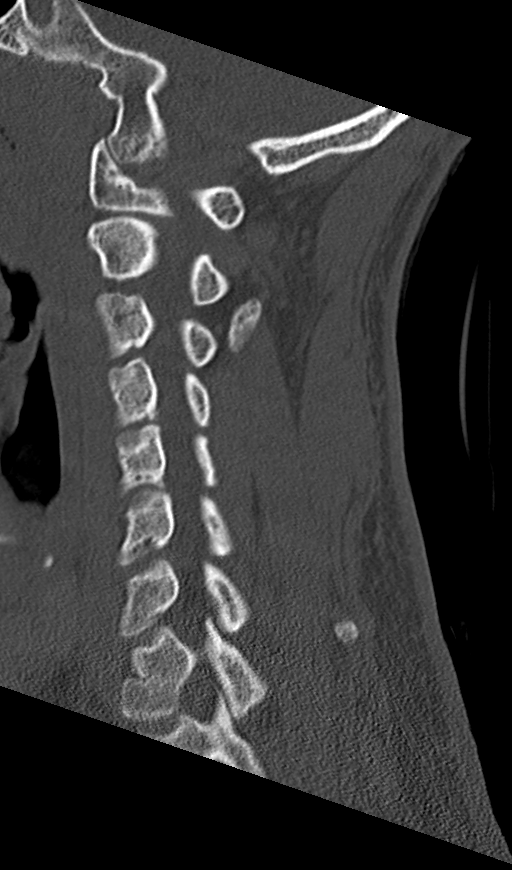
[im 41/61  bone]
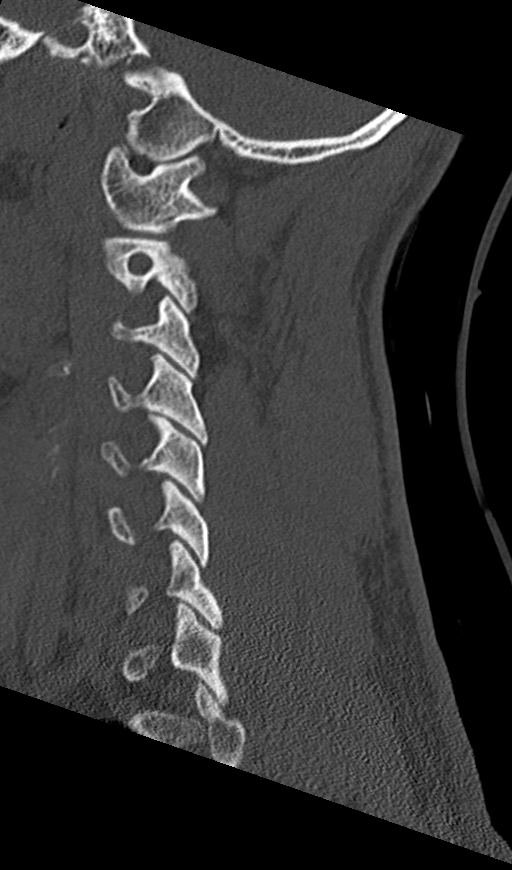

[Series 6: coronal bone · coronal · 0.21mm/px · 3 of 61 slices shown]
[im 13/61  bone]
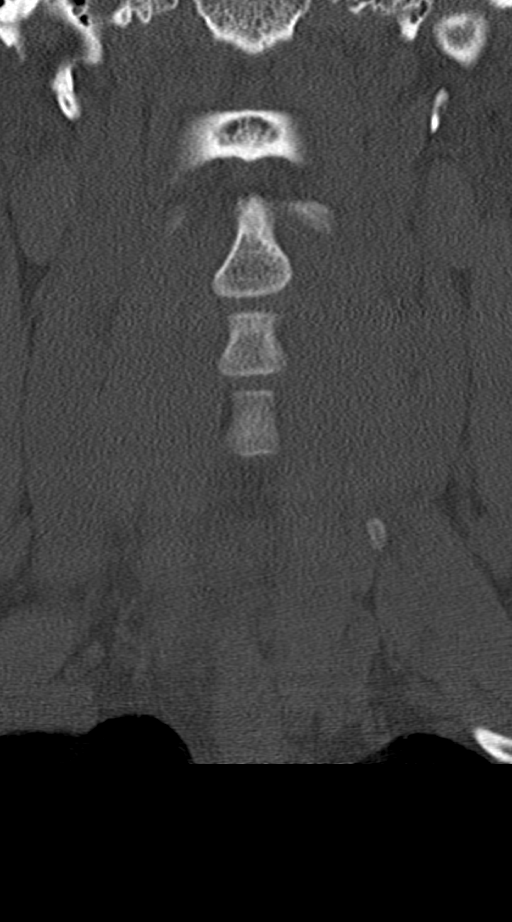
[im 25/61  bone]
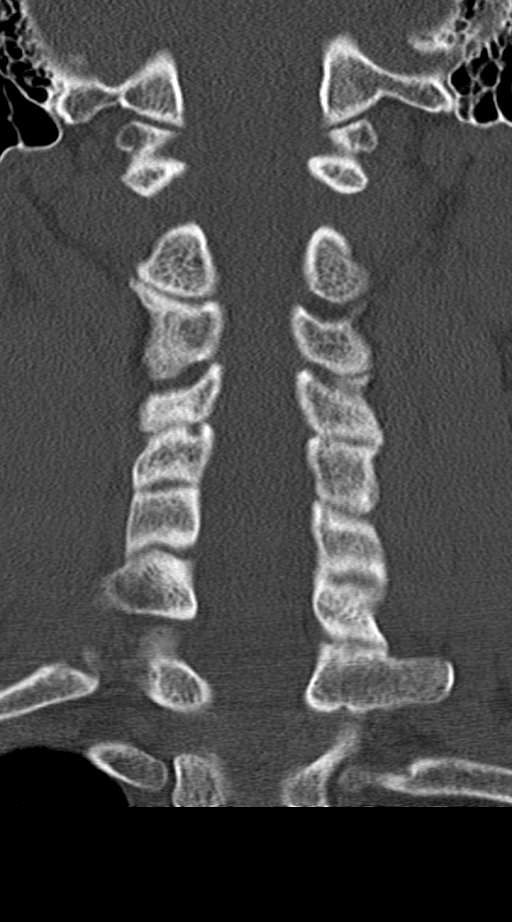
[im 37/61  bone]
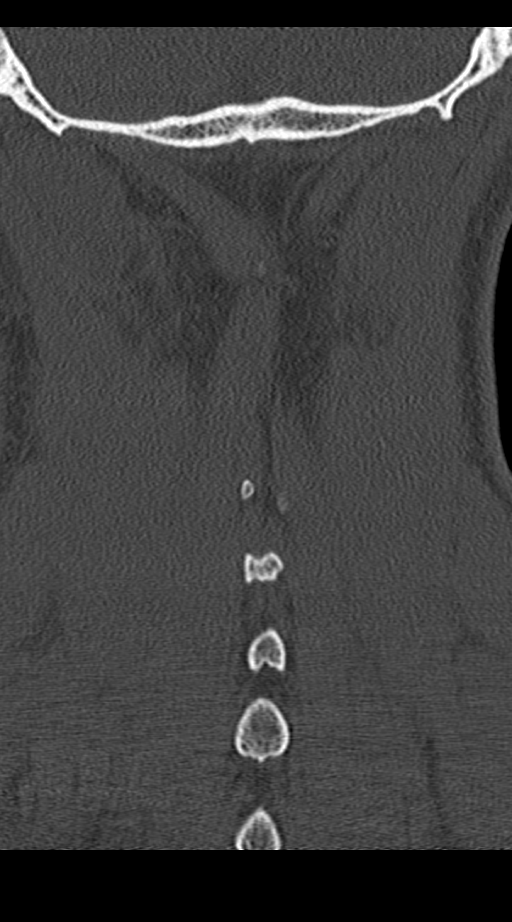

[Series 7: orthogonal axials · axial · 0.21mm/px · z∈[-92,-4]mm · 3 of 76 slices shown, 4 images]
[im 13/76  soft-tissue]
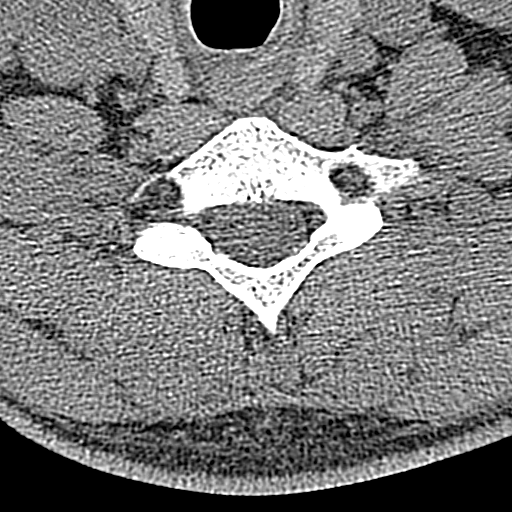
[im 13/76  bone]
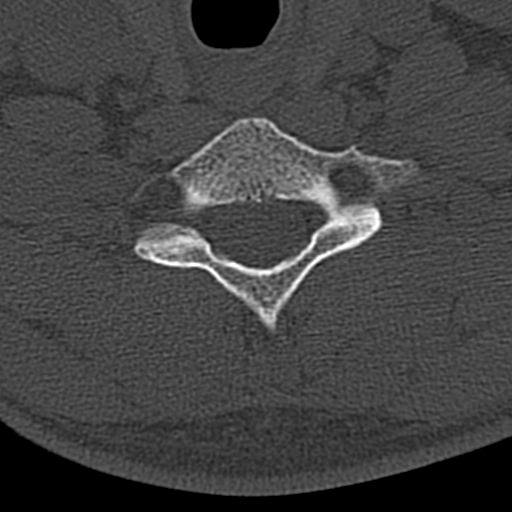
[im 38/76  bone]
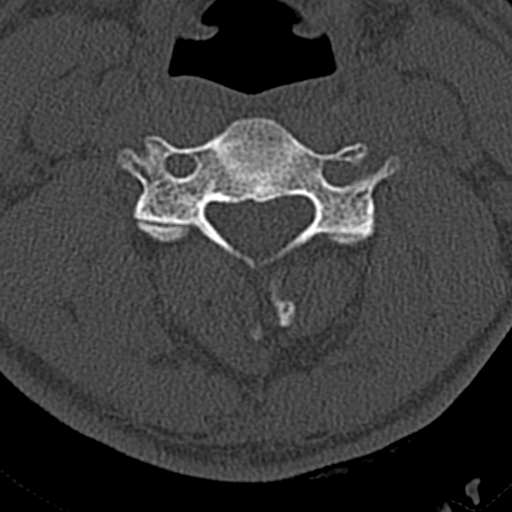
[im 63/76  bone]
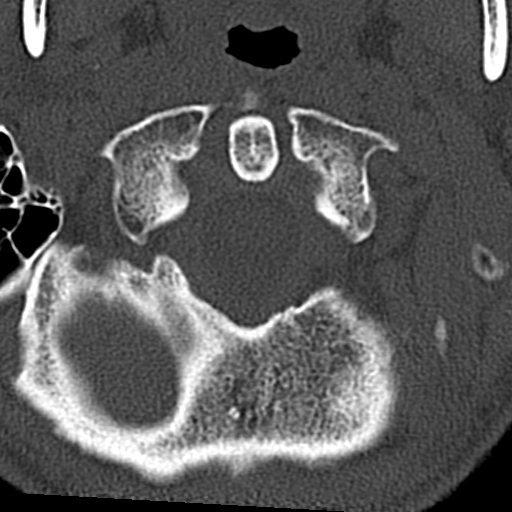

[11 of 33 positions shown; findings below may reference images not displayed]

FINDINGS: Alignment: There is straightening of the normal cervical spine
lordosis. There is no anterior or retrolisthesis.

Skull base and vertebrae: Vertebral body heights are preserved.
There is no evidence of acute fracture.

Soft tissues and spinal canal: No prevertebral fluid or swelling. No
visible canal hematoma.

Disc levels: There is no significant degenerative change in the
cervical spine. The osseous spinal canal and neural foramina are
patent.

Upper chest: The lung apices are clear.

Other: None.
IMPRESSION: No acute fracture or traumatic malalignment of the cervical spine.

## 2020-10-25 IMAGING — DX DG SHOULDER 2+V*L*
3 series · 3 of 3 positions shown · non-contrast
Comparison: Shoulder MRI [DATE], shoulder radiographs
[DATE]

CLINICAL DATA: Pain

EXAM:
LEFT SHOULDER - 2+ VIEW

[shoulder grashey]
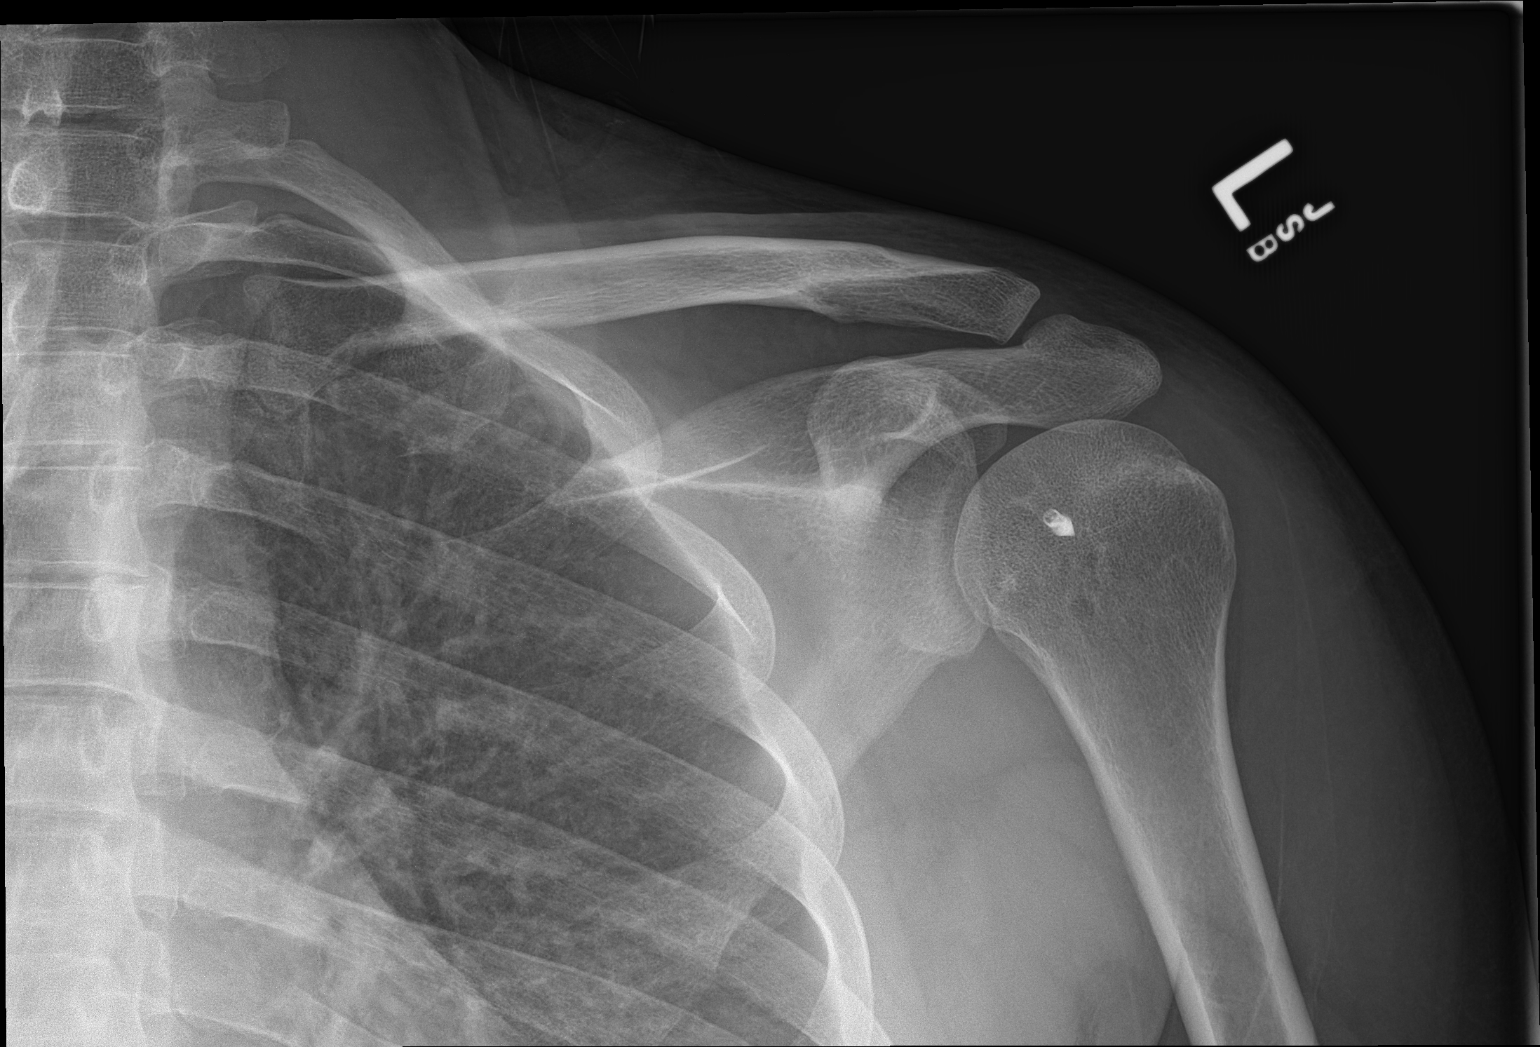

[shoulder y view]
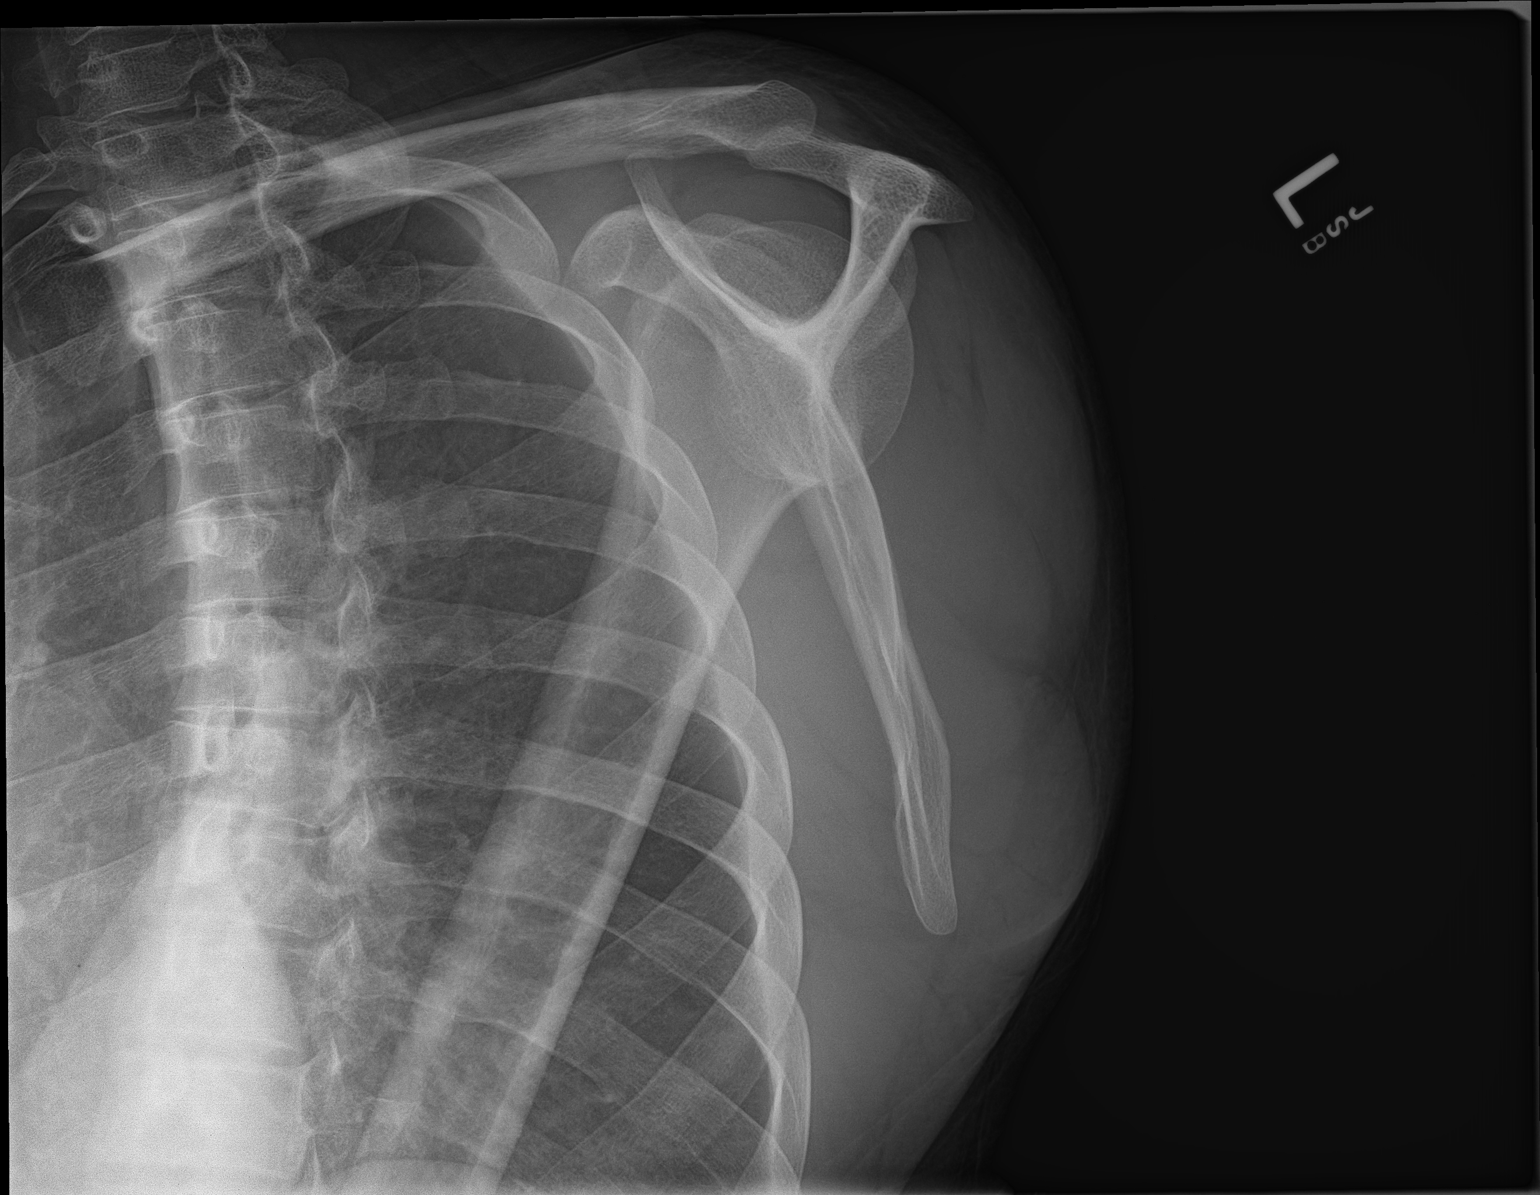

[shoulder axillary]
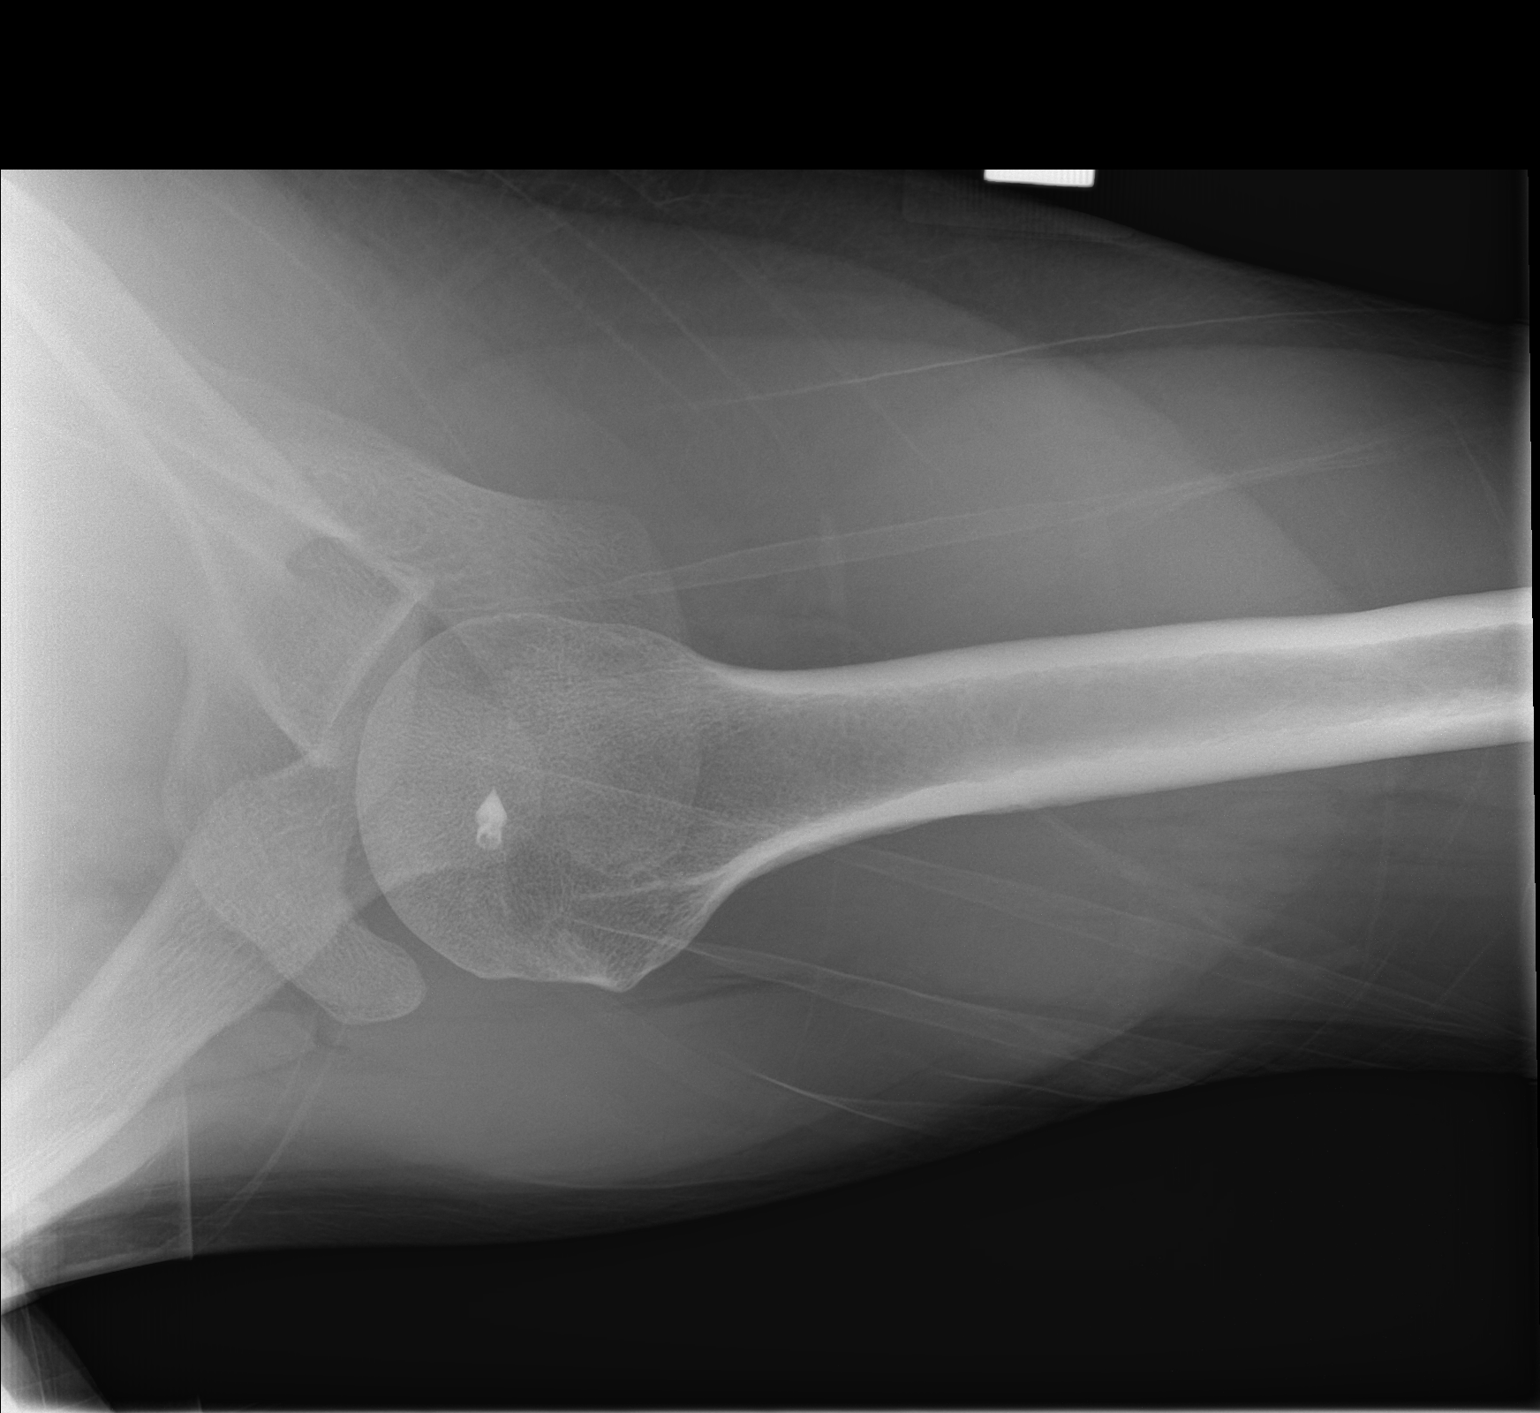

[3 of 3 positions shown; findings below may reference images not displayed]

FINDINGS: There is no acute fracture or dislocation. Alignment is normal. The
glenohumeral and acromioclavicular joint spaces are preserved. A
suture anchor is again noted in the left humeral head.
IMPRESSION: No acute fracture or dislocation.

## 2020-10-25 MED ORDER — OXYCODONE HCL 5 MG PO TABS
5.0000 mg | ORAL_TABLET | Freq: Once | ORAL | Status: AC
  Administered 2020-10-25: 5 mg via ORAL
  Filled 2020-10-25: qty 1

## 2020-10-25 NOTE — ED Notes (Signed)
c-collar removed by provider

## 2020-10-25 NOTE — ED Notes (Signed)
C-collar applied to patient
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
## 2020-10-25 NOTE — Discharge Instructions (Signed)
Imaging was reassuring, recommend over-the-counter pain medications for pain management, also continue with the oxycodone that was prescribed to you.  Please follow-up with PCP for further evaluation.  Come back to the emergency department if you develop chest pain, shortness of breath, severe abdominal pain, uncontrolled nausea, vomiting, diarrhea.
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
## 2020-11-10 ENCOUNTER — Ambulatory Visit: Payer: No Typology Code available for payment source | Admitting: Family Medicine

## 2020-11-10 ENCOUNTER — Other Ambulatory Visit: Payer: Self-pay | Admitting: Family Medicine

## 2020-11-10 DIAGNOSIS — F3161 Bipolar disorder, current episode mixed, mild: Secondary | ICD-10-CM

## 2020-11-10 NOTE — Telephone Encounter (Signed)
Medication Refill - Medication: Seroquel   Has the patient contacted their pharmacy? No. Pt states that he is completely out of medication and is requesting to have a refill until his next appt on 11/14/20. Please advise.  (Agent: If no, request that the patient contact the pharmacy for the refill.) (Agent: If yes, when and what did the pharmacy advise?)  Preferred Pharmacy (with phone number or street name):  [place], [place] [address]  Phone: [phone] Fax: [phone]  Hours: Not open 24 hours    Agent: Please be advised that RX refills may take up to 3 business days. We ask that you follow-up with your pharmacy.

## 2020-11-10 NOTE — Telephone Encounter (Signed)
Pt states he cannot sleep without the  QUEtiapine (SEROQUEL) 100 MG tablet  Wants to know if another dr will fill?  Pt has appt Tues 8/30 with Dr Margarita Rana

## 2020-11-10 NOTE — Telephone Encounter (Signed)
Requested medication (s) are due for refill today: Yes  Requested medication (s) are on the active medication list: Yes  Last refill:  10/09/20  Future visit scheduled: Yes  Notes to clinic:  Unable to refill per protocol, cannot delegate.      Requested Prescriptions  Pending Prescriptions Disp Refills   QUEtiapine (SEROQUEL) 100 MG tablet 30 tablet 0    Sig: Take 1 tablet (100 mg total) by mouth at bedtime.     Not Delegated - Psychiatry:  Antipsychotics - Second Generation (Atypical) - quetiapine Failed - 11/10/2020 10:45 AM      Failed - This refill cannot be delegated      Failed - ALT in normal range and within 180 days    ALT  Date Value Ref Range Status  04/21/2020 49 (H) 9 - 46 U/L Final          Failed - AST in normal range and within 180 days    AST  Date Value Ref Range Status  04/21/2020 15 10 - 40 U/L Final          Failed - Valid encounter within last 6 months    Recent Outpatient Visits           7 months ago [place] [facility], Darrick Penna, MD   1 year ago Right upper quadrant abdominal pain   [place], Enobong, MD   1 year ago Cervical nerve root impingement   [place], Enobong, MD   1 year ago Chronic right shoulder pain    [facility], Gwen Her, MD   1 year ago Sinus congestion   [place], Enobong, MD       Future Appointments             In 4 days Charlott Rakes, MD [place] BP in normal range    BP Readings from Last 1 Encounters:  10/25/20 126/86

## 2020-11-13 ENCOUNTER — Other Ambulatory Visit: Payer: Self-pay | Admitting: Family Medicine

## 2020-11-13 ENCOUNTER — Encounter: Payer: Self-pay | Admitting: Family Medicine

## 2020-11-13 DIAGNOSIS — F3161 Bipolar disorder, current episode mixed, mild: Secondary | ICD-10-CM

## 2020-11-13 NOTE — Telephone Encounter (Signed)
Melissa patient fiance called in to inquire of Dr Margarita Rana about patient getting his refill since he is not sleeping at night from not having this medication. Please advise and call Melissa with an update  [phone]

## 2020-11-13 NOTE — Telephone Encounter (Signed)
Requested medication (s) are due for refill today: yes  Requested medication (s) are on the active medication list: yes  Last refill:  10/09/20 #30  Future visit scheduled: yes  Notes to clinic:  Please review for refill. Refill not delegated per protocol    Requested Prescriptions  Pending Prescriptions Disp Refills   QUEtiapine (SEROQUEL) 100 MG tablet [Pharmacy Med Name: QUEtiapine Fumarate 100 MG Oral Tablet] 30 tablet 0    Sig: TAKE 1 TABLET BY MOUTH AT BEDTIME     Not Delegated - Psychiatry:  Antipsychotics - Second Generation (Atypical) - quetiapine Failed - 11/13/2020  1:29 PM      Failed - This refill cannot be delegated      Failed - ALT in normal range and within 180 days    ALT  Date Value Ref Range Status  04/21/2020 49 (H) 9 - 46 U/L Final          Failed - AST in normal range and within 180 days    AST  Date Value Ref Range Status  04/21/2020 15 10 - 40 U/L Final          Failed - Valid encounter within last 6 months    Recent Outpatient Visits           7 months ago [place] [facility], Darrick Penna, MD   1 year ago Right upper quadrant abdominal pain   [place], Enobong, MD   1 year ago Cervical nerve root impingement   [place], Enobong, MD   1 year ago Chronic right shoulder pain   [facility], Gwen Her, MD   1 year ago Sinus congestion   [place], Enobong, MD       Future Appointments             Tomorrow Charlott Rakes, MD [place] - Last BP in normal range    BP Readings from Last 1 Encounters:  10/25/20 126/86

## 2020-11-14 ENCOUNTER — Encounter: Payer: Self-pay | Admitting: Family Medicine

## 2020-11-14 ENCOUNTER — Telehealth (HOSPITAL_BASED_OUTPATIENT_CLINIC_OR_DEPARTMENT_OTHER): Payer: 59 | Admitting: Family Medicine

## 2020-11-14 DIAGNOSIS — F3161 Bipolar disorder, current episode mixed, mild: Secondary | ICD-10-CM | POA: Diagnosis not present

## 2020-11-14 DIAGNOSIS — G4709 Other insomnia: Secondary | ICD-10-CM

## 2020-11-14 DIAGNOSIS — M542 Cervicalgia: Secondary | ICD-10-CM

## 2020-11-14 MED ORDER — TIZANIDINE HCL 4 MG PO TABS: 4.0000 mg | ORAL_TABLET | Freq: Three times a day (TID) | ORAL | 2 refills | Status: DC | PRN

## 2020-11-14 MED ORDER — QUETIAPINE FUMARATE 100 MG PO TABS: 100.0000 mg | ORAL_TABLET | Freq: Every day | ORAL | 6 refills | Status: DC

## 2020-11-14 MED ORDER — GABAPENTIN 300 MG PO CAPS
300.0000 mg | ORAL_CAPSULE | Freq: Every day | ORAL | 3 refills | Status: DC
Start: 2020-11-14 — End: 2021-03-20
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
## 2020-12-13 ENCOUNTER — Ambulatory Visit
Admission: EM | Admit: 2020-12-13 | Discharge: 2020-12-13 | Disposition: A | Discharge status: Home / Self Care | Payer: 59 | Attending: Physician Assistant | Admitting: Physician Assistant

## 2020-12-13 ENCOUNTER — Other Ambulatory Visit: Payer: Self-pay

## 2020-12-13 ENCOUNTER — Encounter: Payer: Self-pay | Admitting: Emergency Medicine

## 2020-12-13 DIAGNOSIS — Z20822 Contact with and (suspected) exposure to covid-19: Secondary | ICD-10-CM

## 2020-12-13 DIAGNOSIS — J069 Acute upper respiratory infection, unspecified: Secondary | ICD-10-CM

## 2020-12-13 MED ORDER — BENZONATATE 100 MG PO CAPS: 100.0000 mg | ORAL_CAPSULE | Freq: Four times a day (QID) | ORAL | 0 refills | Status: DC | PRN

## 2020-12-13 NOTE — ED Triage Notes (Signed)
Coughing since yesterday.  States he feels hot

## 2020-12-13 NOTE — Discharge Instructions (Addendum)
Covid is pending

## 2020-12-14 ENCOUNTER — Encounter: Payer: Self-pay | Admitting: Family Medicine

## 2020-12-14 ENCOUNTER — Ambulatory Visit: Payer: Self-pay | Admitting: *Deleted

## 2020-12-14 LAB — COVID-19, FLU A+B NAA
Influenza A, NAA: NOT DETECTED
Influenza B, NAA: NOT DETECTED
SARS-CoV-2, NAA: NOT DETECTED

## 2020-12-14 NOTE — Telephone Encounter (Signed)
Pt has sent a mychart message to PCP and PCP will respond to patient via mychart

## 2020-12-14 NOTE — Telephone Encounter (Signed)
Reason for Disposition  MILD difficulty breathing (e.g., minimal/no SOB at rest, SOB with walking, pulse <100)  Answer Assessment - Initial Assessment Questions 1. RESPIRATORY STATUS: "Describe your breathing?" (e.g., wheezing, shortness of breath, unable to speak, severe coughing)      Shortness of breath, difficulty breathing,  coughing.  2. ONSET: "When did this breathing problem begin?"      Monday 12/11/20 or Tuesday 12/12/20 3. PATTERN "Does the difficult breathing come and go, or has it been constant since it started?"      Constant breathing  feels heavy  4. SEVERITY: "How bad is your breathing?" (e.g., mild, moderate, severe)    - MILD: No SOB at rest, mild SOB with walking, speaks normally in sentences, can lie down, no retractions, pulse < 100.    - MODERATE: SOB at rest, SOB with minimal exertion and prefers to sit, cannot lie down flat, speaks in phrases, mild retractions, audible wheezing, pulse 100-120.    - SEVERE: Very SOB at rest, speaks in single words, struggling to breathe, sitting hunched forward, retractions, pulse > 120      Breathing feels heavy  5. RECURRENT SYMPTOM: "Have you had difficulty breathing before?" If Yes, ask: "When was the last time?" and "What happened that time?"      No  6. CARDIAC HISTORY: "Do you have any history of heart disease?" (e.g., heart attack, angina, bypass surgery, angioplasty)      no 7. LUNG HISTORY: "Do you have any history of lung disease?"  (e.g., pulmonary embolus, asthma, emphysema)     no 8. CAUSE: "What do you think is causing the breathing problem?"      Has been checked for covid. Hx at ED [place] yesterday for upper respiratory infection 9. OTHER SYMPTOMS: "Do you have any other symptoms? (e.g., dizziness, runny nose, cough, chest pain, fever)     Chest pain , cough , breathing heavy, wheezing 10. O2 SATURATION MONITOR:  "Do you use an oxygen saturation monitor (pulse oximeter) at home?" If Yes, "What is your reading (oxygen  level) today?" "What is your usual oxygen saturation reading?" (e.g., 95%)       na 11. PREGNANCY: "Is there any chance you are pregnant?" "When was your last menstrual period?"       na 12. TRAVEL: "Have you traveled out of the country in the last month?" (e.g., travel history, exposures)       na  Answer Assessment - Initial Assessment Questions 1. COVID-19 DIAGNOSIS: "Who made your COVID-19 diagnosis?" "Was it confirmed by a positive lab test or self-test?" If not diagnosed by a doctor (or NP/PA), ask "Are there lots of cases (community spread) where you live?" Note: See public health department website, if unsure.     Tested yesterday ED/UC pending results  2. COVID-19 EXPOSURE: "Was there any known exposure to COVID before the symptoms began?" CDC Definition of close contact: within 6 feet (2 meters) for a total of 15 minutes or more over a 24-hour period.      no 3. ONSET: "When did the COVID-19 symptoms start?"      9/26 or 12/12/20 4. WORST SYMPTOM: "What is your worst symptom?" (e.g., cough, fever, shortness of breath, muscle aches)     Difficulty breathing feels heavy, chest pain with coughing. Headache wheezing .  5. COUGH: "Do you have a cough?" If Yes, ask: "How bad is the cough?"       Yes. Dry cough now  6. FEVER: "Do you  have a fever?" If Yes, ask: "What is your temperature, how was it measured, and when did it start?"     denies 7. RESPIRATORY STATUS: "Describe your breathing?" (e.g., shortness of breath, wheezing, unable to speak)      Wheezing, able to speak without difficulty.  8. BETTER-SAME-WORSE: "Are you getting better, staying the same or getting worse compared to yesterday?"  If getting worse, ask, "In what way?"     Getting worse 9. HIGH RISK DISEASE: "Do you have any chronic medical problems?" (e.g., asthma, heart or lung disease, weak immune system, obesity, etc.)     denies 10. VACCINE: "Have you had the COVID-19 vaccine?" If Yes, ask: "Which one, how many  shots, when did you get it?"       X2 Moderna 11. BOOSTER: "Have you received your COVID-19 booster?" If Yes, ask: "Which one and when did you get it?"       no 12. PREGNANCY: "Is there any chance you are pregnant?" "When was your last menstrual period?"       na 13. OTHER SYMPTOMS: "Do you have any other symptoms?"  (e.g., chills, fatigue, headache, loss of smell or taste, muscle pain, sore throat)       Headache, dry cough, chest pain, wheezing, difficulty breathing feels heavy 14. O2 SATURATION MONITOR:  "Do you use an oxygen saturation monitor (pulse oximeter) at home?" If Yes, ask "What is your reading (oxygen level) today?" "What is your usual oxygen saturation reading?" (e.g., 95%)       na  Protocols used: Breathing Difficulty-A-AH, Coronavirus (COVID-19) Diagnosed or Suspected-A-AH

## 2020-12-14 NOTE — Telephone Encounter (Signed)
C/o difficulty breathing feels heavy , chest pain and coughing, headache, wheezing. Paitent went to ED/UC yesterday 12/13/20 for symptoms and covid testing pending results at this time. Dx with upper respiratory infection. Given tesslon for cough. Patient reports breathing feels heavy and headache when coughing. 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 Requesting any medication to treat symptoms. Reviewed with patient need to find out results of covid test and influenza test. No available virtual appt noted with any provider . Please advise . Instructed patient to treat symptoms with OTC medications , drink plenty of fluids and rest . If breathing difficulty is worsening go to ED. Care advise given. Patient verbalized understanding of care advise and to call back or go to [facility] or ED if symptoms worsen.

## 2020-12-15 ENCOUNTER — Other Ambulatory Visit: Payer: Self-pay | Admitting: Family Medicine

## 2020-12-15 ENCOUNTER — Telehealth: Payer: Self-pay | Admitting: Family Medicine

## 2020-12-15 ENCOUNTER — Encounter: Payer: Self-pay | Admitting: Family Medicine

## 2020-12-15 MED ORDER — ALBUTEROL SULFATE HFA 108 (90 BASE) MCG/ACT IN AERS: 2.0000 | INHALATION_SPRAY | Freq: Four times a day (QID) | RESPIRATORY_TRACT | 0 refills | Status: DC | PRN

## 2020-12-15 MED ORDER — ALBUTEROL SULFATE HFA 108 (90 BASE) MCG/ACT IN AERS: 2.0000 | INHALATION_SPRAY | Freq: Four times a day (QID) | RESPIRATORY_TRACT | 2 refills | Status: DC | PRN

## 2020-12-15 NOTE — Telephone Encounter (Signed)
Rx sent for generic Proair to pt's pref pharmacy.

## 2020-12-15 NOTE — Telephone Encounter (Signed)
Mickel Baas, from Pharmacy, calling on behalf of the prescription, Ventolin, that was prescribed for pt. She states that the insurance will not cover this, but will cover the generic pro air. She is requesting to know if this can be changed. Please advise.       [facility] [address] [name] [address]  Phone: [phone] Fax: [phone]  Hours: Not open 24 hours

## 2020-12-19 NOTE — ED Provider Notes (Signed)
[facility]    CSN: [id number] Arrival date & time: 12/13/20  1001      History   Chief Complaint No chief complaint on file.   HPI Jimmy [name] is a 30 y.o. male.   The history is provided by the patient. No language interpreter was used.  Cough Cough characteristics:  Non-productive Sputum characteristics:  Nondescript Severity:  Moderate Duration:  1 day Timing:  Constant Progression:  Worsening Ineffective treatments:  None tried Associated symptoms: chills and myalgias    Past Medical History:  Diagnosis Date   ADHD (attention deficit hyperactivity disorder)    Anxiety    Arthritis    Cancer ([place])    skin cancer   Chest pain 08/2018   GERD (gastroesophageal reflux disease)    no meds   History of COVID-19 04/05/2020   Migraine    migraines due to pinched nerve   Pinched nerve in neck    Scoliosis     Patient Active Problem List   Diagnosis Date Noted   History of Abnormal LFTs (liver function tests) 12/30/2019   Abnormal liver ultrasound 12/30/2019   Fatty liver 12/30/2019   Right flank pain 12/30/2019   Abdominal wall pain 12/30/2019   Gastroesophageal reflux disease without esophagitis 12/30/2019   Superior labrum anterior-to-posterior (SLAP) tear of left shoulder 10/01/2019   Carpal tunnel syndrome 09/08/2019   Bursitis of shoulder, right 08/31/2019   Incisional hernia, without obstruction or gangrene 05/20/2018   Opiate dependence ([place]) 08/02/2013    Past Surgical History:  Procedure Laterality Date   BICEPT TENODESIS Left 06/21/2020   Procedure: BICEPS TENODESIS TENOLYSIS;  Surgeon: Leandrew Koyanagi, MD;  Location: [facility];  Service: Orthopedics;  Laterality: Left;   ESOPHAGUS SURGERY     as a baby   SHOULDER ARTHROSCOPY WITH BICEPS TENDON REPAIR Left 03/01/2020   Procedure: LEFT SHOULDER ARTHROSCOPY WITH BICEPS TENODESIS;  Surgeon: Leandrew Koyanagi, MD;  Location: [facility];  Service: Orthopedics;   Laterality: Left;   SHOULDER ARTHROSCOPY WITH SUBACROMIAL DECOMPRESSION Right 09/01/2019   Procedure: RIGHT SHOULDER ARTHROSCOPY WITH EXTENSIVE DEBRIDEMENT AND SUBACROMIAL DECOMPRESSION;  Surgeon: Leandrew Koyanagi, MD;  Location: [facility];  Service: Orthopedics;  Laterality: Right;   VENTRAL HERNIA REPAIR N/A 08/31/2018   Procedure: HERNIA REPAIR VENTRAL ADULT;  Surgeon: Olean Ree, MD;  Location: [facility];  Service: General;  Laterality: N/A;       Home Medications    Prior to Admission medications   Medication Sig Start Date End Date Taking? Authorizing Provider  benzonatate (TESSALON PERLES) 100 MG capsule Take 1 capsule (100 mg total) by mouth every 6 (six) hours as needed for cough. 12/13/20 12/13/21 Yes Fransico Meadow, PA-C  acetaminophen-codeine (TYLENOL #3) 300-30 MG tablet Take 1 tablet by mouth every 6 (six) hours as needed for moderate pain. 08/16/20   Aundra Dubin, PA-C  albuterol (VENTOLIN HFA) 108 (90 Base) MCG/ACT inhaler Inhale 2 puffs into the lungs every 6 (six) hours as needed for wheezing or shortness of breath. 12/15/20   Charlott Rakes, MD  celecoxib (CELEBREX) 100 MG capsule Take 1 capsule (100 mg total) by mouth 2 (two) times daily. 04/25/20   Magnant, Charles L, PA-C  Erenumab-aooe (AIMOVIG) 140 MG/ML SOAJ Inject 140 mg into the skin every 28 (twenty-eight) days. 10/17/20   Pieter Partridge, DO  gabapentin (NEURONTIN) 300 MG capsule Take 1 capsule (300 mg total) by mouth at bedtime. 11/14/20   Newlin,  Enobong, MD  HYDROcodone-acetaminophen (NORCO) 5-325 MG tablet Take 1-2 tablets by mouth 2 (two) times daily as needed. Patient not taking: Reported on 10/17/2020 07/13/20   Aundra Dubin, PA-C  ISOtretinoin (ACCUTANE) 30 MG capsule Take 1 capsule (30 mg total) by mouth daily. 05/25/20   Sheffield, Ronalee Red, PA-C  oxyCODONE-acetaminophen (PERCOCET) 5-325 MG tablet Take 1-2 tablets by mouth every 8 (eight) hours as needed for severe pain. Patient not taking:  Reported on 10/17/2020 06/21/20   Leandrew Koyanagi, MD  oxyCODONE-acetaminophen (PERCOCET) 5-325 MG tablet Take 1 tablet by mouth every 8 (eight) hours as needed. Patient not taking: Reported on 10/17/2020 06/28/20   Aundra Dubin, PA-C  oxyCODONE-acetaminophen (PERCOCET) 5-325 MG tablet Take 1 tablet by mouth every 8 (eight) hours as needed. Patient not taking: Reported on 10/17/2020 07/06/20   Aundra Dubin, PA-C  QUEtiapine (SEROQUEL) 100 MG tablet Take 1 tablet (100 mg total) by mouth at bedtime. 11/14/20   Charlott Rakes, MD  tiZANidine (ZANAFLEX) 4 MG tablet Take 1 tablet (4 mg total) by mouth every 8 (eight) hours as needed for muscle spasms. 11/14/20   Charlott Rakes, MD  Ubrogepant (UBRELVY) 100 MG TABS Take 1 tablet by mouth as needed (May repeat 1 tablet in 2 hours.  Maximum 2 tablets in 24 hours.). Patient not taking: Reported on 10/17/2020 01/18/20   Pieter Partridge, DO  famotidine (PEPCID) 40 MG tablet Take 1 tablet (40 mg total) by mouth daily. 08/26/18 12/28/19  Robyn Haber, MD    Family History Family History  Problem Relation Age of Onset   Diabetes Mother    Kidney cancer Mother    Diabetes Maternal Grandmother    Cancer Maternal Grandmother        unknown type. doing chemo   Cancer Maternal Grandfather    Diabetes Maternal Aunt    Stomach cancer Neg Hx    Pancreatic cancer Neg Hx    Esophageal cancer Neg Hx    Liver disease Neg Hx    Colon cancer Neg Hx    Rectal cancer Neg Hx     Social History Social History   Tobacco Use   Smoking status: Every Day    Packs/day: 1.00    Years: 15.00    Pack years: 15.00    Types: Cigarettes   Smokeless tobacco: Former  Scientific laboratory technician Use: Former  Substance Use Topics   Alcohol use: Not Currently   Drug use: Not Currently     Allergies   Cyclobenzaprine, Depakote [divalproex sodium], Ritalin [methylphenidate hcl], Naproxen, Other, and Tramadol   Review of Systems Review of Systems  Constitutional:  Positive for  chills.  Respiratory:  Positive for cough.   Musculoskeletal:  Positive for myalgias.  All other systems reviewed and are negative.   Physical Exam Triage Vital Signs ED Triage Vitals  Enc Vitals Group     BP 12/13/20 1039 133/75     Pulse Rate 12/13/20 1039 78     Resp 12/13/20 1039 18     Temp 12/13/20 1039 98 F (36.7 C)     Temp Source 12/13/20 1039 Oral     SpO2 12/13/20 1039 97 %     Weight --      Height --      Head Circumference --      Peak Flow --      Pain Score 12/13/20 1040 6     Pain Loc --  Pain Edu? --      Excl. in [place]? --    No data found.  Updated Vital Signs BP 133/75 (BP Location: Right Arm)   Pulse 78   Temp 98 F (36.7 C) (Oral)   Resp 18   SpO2 97%   Visual Acuity Right Eye Distance:   Left Eye Distance:   Bilateral Distance:    Right Eye Near:   Left Eye Near:    Bilateral Near:     Physical Exam Vitals and nursing note reviewed.  Constitutional:      Appearance: He is well-developed.  HENT:     Head: Normocephalic and atraumatic.  Eyes:     Conjunctiva/sclera: Conjunctivae normal.  Cardiovascular:     Rate and Rhythm: Normal rate and regular rhythm.     Heart sounds: No murmur heard. Pulmonary:     Effort: Pulmonary effort is normal. No respiratory distress.     Breath sounds: Normal breath sounds.  Abdominal:     Palpations: Abdomen is soft.     Tenderness: There is no abdominal tenderness.  Musculoskeletal:     Cervical back: Neck supple.  Skin:    General: Skin is warm and dry.  Neurological:     Mental Status: He is alert.     UC Treatments / Results  Labs (all labs ordered are listed, but only abnormal results are displayed) Labs Reviewed  COVID-19, FLU A+B NAA   Narrative:    Performed at:  [address] [place], [name]  [id number] Lab Director: Rush Farmer MD, Phone:  [phone]    EKG   Radiology No results found.  Procedures Procedures (including critical care  time)  Medications Ordered in UC Medications - No data to display  Initial Impression / Assessment and Plan / UC Course  I have reviewed the triage vital signs and the nursing notes.  Pertinent labs & imaging results that were available during my care of the patient were reviewed by me and considered in my medical decision making (see chart for details).      An After Visit Summary was printed and given to the patient.  Final Clinical Impressions(s) / UC Diagnoses   Final diagnoses:  Viral URI  Exposure to COVID-19 virus     Discharge Instructions      Covid is pending    ED Prescriptions     Medication Sig Dispense Auth. Provider   benzonatate (TESSALON PERLES) 100 MG capsule Take 1 capsule (100 mg total) by mouth every 6 (six) hours as needed for cough. 30 capsule Fransico Meadow, [place]      An After Visit Summary was printed and given to the patient.  PDMP not reviewed this encounter.   Fransico Meadow, [place] 12/19/20 [id number]

## 2021-01-09 ENCOUNTER — Encounter: Payer: Self-pay | Admitting: Orthopaedic Surgery

## 2021-01-09 ENCOUNTER — Encounter: Payer: Self-pay | Admitting: Family Medicine

## 2021-01-09 NOTE — Telephone Encounter (Signed)
Sure that's fine.  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

## 2021-01-11 ENCOUNTER — Encounter: Payer: Self-pay | Admitting: Family Medicine

## 2021-01-17 ENCOUNTER — Telehealth: Payer: Self-pay

## 2021-01-17 NOTE — Telephone Encounter (Signed)
New message   Website CoverMyMeds   Traquan Duarte (Key: [id number]) Aimovig 140MG /ML auto-injectors   Form [facility] Electronic Prior Authorization Form (2017 NCPDP) Created 2 minutes ago Sent to Plan less than a minute ago Plan Response less than a minute ago Submit Clinical Questions Determination   Message from Plan This medication or product is on your plan's list of covered drugs. Prior authorization is not required at this time. If your pharmacy has questions regarding the processing of your prescription, please have them call the [facility] pharmacy help desk at [phone]. **Please note: This request was submitted electronically. Formulary lowering, tiering exception, cost reduction and/or pre-benefit determination review (including prospective Medicare hospice reviews) requests cannot be requested using this method of submission. Providers contact us at [phone] for further assistance.

## 2021-01-17 NOTE — Telephone Encounter (Signed)
New message   Christhoper Busbee (Key: [id number]) Aimovig 140MG /ML auto-injectors   Form [facility] Electronic Prior Authorization Form (2017 NCPDP) Created 2 minutes ago Sent to Plan less than a minute ago Plan Response less than a minute ago Submit Clinical Questions  Determination Message from Plan This medication or product is on your plan's list of covered drugs. Prior authorization is not required at this time. If your pharmacy has questions regarding the processing of your prescription, please have them call the [facility] pharmacy help desk at [phone]. **Please note: This request was submitted electronically. Formulary lowering, tiering exception, cost reduction and/or pre-benefit determination review (including prospective Medicare hospice reviews) requests cannot be requested using this method of submission. Providers contact us at [phone] for further assistance.

## 2021-02-01 ENCOUNTER — Telehealth: Payer: Self-pay | Admitting: Neurology

## 2021-02-01 NOTE — Telephone Encounter (Signed)
F/u   Margarito Courser (Key: [id number]) Aimovig 140MG /ML auto-injectors   Form [facility] Electronic Prior Authorization Form (2017 NCPDP) Created 15 days ago Sent to Plan 15 days ago Plan Response 15 days ago Submit Clinical Questions Determination Message from Plan This medication or product is on your plan's list of covered drugs. Prior authorization is not required at this time. If your pharmacy has questions regarding the processing of your prescription, please have them call the [facility] pharmacy help desk at [phone]. **Please note: This request was submitted electronically. Formulary lowering, tiering exception, cost reduction and/or pre-benefit determination review (including prospective Medicare hospice reviews) requests cannot be requested using this method of submission. Providers contact us at [phone] for further assistance.

## 2021-02-19 ENCOUNTER — Ambulatory Visit: Payer: No Typology Code available for payment source | Admitting: Family Medicine
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
## 2021-03-13 ENCOUNTER — Telehealth: Payer: Self-pay

## 2021-03-13 NOTE — Telephone Encounter (Signed)
New message   Jimmy [name] (Key: Mariea Stable) Rx #: [id number] Aimovig 140MG /ML auto-injectors   Form [employer] [place] [employer] Electronic Request Form (CB) Created 6 hours ago Sent to Plan 6 minutes ago Plan Response 6 minutes ago Submit Clinical Questions less than a minute ago Determination Wait for Determination Please wait for [employer] to return a determination.   Your information has been submitted to [place]. [employer] [place] will review the request and notify you of the determination decision directly, typically within 72 hours of receiving all information.  You will also receive your request decision electronically. To check for an update later, open this request again from your dashboard.  If [employer] has not responded within the specified timeframe or if you have any questions about your PA submission, contact [place] directly at [phone].

## 2021-03-18 ENCOUNTER — Other Ambulatory Visit: Payer: Self-pay | Admitting: Family Medicine

## 2021-03-18 DIAGNOSIS — M542 Cervicalgia: Secondary | ICD-10-CM

## 2021-04-09 NOTE — Telephone Encounter (Signed)
F/u   Received fax from [facility] [name]   Medication Aimovig   Effective date 03/13/2021 to 06/04/21

## 2021-04-20 ENCOUNTER — Ambulatory Visit: Payer: Self-pay

## 2021-04-20 ENCOUNTER — Ambulatory Visit
Admission: EM | Admit: 2021-04-20 | Discharge: 2021-04-20 | Disposition: A | Discharge status: Home / Self Care | Payer: Self-pay | Attending: Family Medicine | Admitting: Family Medicine

## 2021-04-20 ENCOUNTER — Other Ambulatory Visit: Payer: Self-pay

## 2021-04-20 DIAGNOSIS — S46912A Strain of unspecified muscle, fascia and tendon at shoulder and upper arm level, left arm, initial encounter: Secondary | ICD-10-CM

## 2021-04-20 MED ORDER — PREDNISONE 20 MG PO TABS: 40.0000 mg | ORAL_TABLET | Freq: Every day | ORAL | 0 refills | Status: DC

## 2021-04-20 NOTE — Discharge Instructions (Signed)
Follow-up with your orthopedic specialist next week to recheck the area

## 2021-04-20 NOTE — ED Provider Notes (Signed)
[facility]    CSN: [id number] Arrival date & time: 04/20/21  1456      History   Chief Complaint Chief Complaint  Patient presents with   Appointment    1500   Shoulder Pain    HPI Jimmy [name] is a 31 y.o. male.   Patient presenting today with new onset left anterior shoulder pain that started upon waking this morning.  He states he has had 2 surgeries in the past, 1 to repair the bicep tendon and 1 for a labral tear and the pain is near the center of his scar from December 2021.  He denies any known injuries in the last few days or making any sudden movements to cause pain overnight.  Denies swelling, discoloration to the area, numbness, tingling, weakness of the arm, decreased range of motion.  Tried some Tylenol and some hydrocodone with no relief and states he takes methocarbamol daily.   Past Medical History:  Diagnosis Date   ADHD (attention deficit hyperactivity disorder)    Anxiety    Arthritis    Cancer ([place])    skin cancer   Chest pain 08/2018   GERD (gastroesophageal reflux disease)    no meds   History of COVID-19 04/05/2020   Migraine    migraines due to pinched nerve   Pinched nerve in neck    Scoliosis     Patient Active Problem List   Diagnosis Date Noted   History of Abnormal LFTs (liver function tests) 12/30/2019   Abnormal liver ultrasound 12/30/2019   Fatty liver 12/30/2019   Right flank pain 12/30/2019   Abdominal wall pain 12/30/2019   Gastroesophageal reflux disease without esophagitis 12/30/2019   Superior labrum anterior-to-posterior (SLAP) tear of left shoulder 10/01/2019   Carpal tunnel syndrome 09/08/2019   Bursitis of shoulder, right 08/31/2019   Incisional hernia, without obstruction or gangrene 05/20/2018   Opiate dependence ([place]) 08/02/2013    Past Surgical History:  Procedure Laterality Date   BICEPT TENODESIS Left 06/21/2020   Procedure: BICEPS TENODESIS TENOLYSIS;  Surgeon: Leandrew Koyanagi, MD;  Location: [place];  Service: Orthopedics;  Laterality: Left;   ESOPHAGUS SURGERY     as a baby   SHOULDER ARTHROSCOPY WITH BICEPS TENDON REPAIR Left 03/01/2020   Procedure: LEFT SHOULDER ARTHROSCOPY WITH BICEPS TENODESIS;  Surgeon: Leandrew Koyanagi, MD;  Location: [place];  Service: Orthopedics;  Laterality: Left;   SHOULDER ARTHROSCOPY WITH SUBACROMIAL DECOMPRESSION Right 09/01/2019   Procedure: RIGHT SHOULDER ARTHROSCOPY WITH EXTENSIVE DEBRIDEMENT AND SUBACROMIAL DECOMPRESSION;  Surgeon: Leandrew Koyanagi, MD;  Location: [place];  Service: Orthopedics;  Laterality: Right;   VENTRAL HERNIA REPAIR N/A 08/31/2018   Procedure: HERNIA REPAIR VENTRAL ADULT;  Surgeon: Olean Ree, MD;  Location: [facility];  Service: General;  Laterality: N/A;       Home Medications    Prior to Admission medications   Medication Sig Start Date End Date Taking? Authorizing Provider  predniSONE (DELTASONE) 20 MG tablet Take 2 tablets (40 mg total) by mouth daily with breakfast. 04/20/21  Yes Volney American, PA-C  acetaminophen-codeine (TYLENOL #3) 300-30 MG tablet Take 1 tablet by mouth every 6 (six) hours as needed for moderate pain. 08/16/20   Aundra Dubin, PA-C  albuterol (VENTOLIN HFA) 108 (90 Base) MCG/ACT inhaler Inhale 2 puffs into the lungs every 6 (six) hours as needed for wheezing or shortness of breath. 12/15/20   Charlott Rakes, MD  benzonatate (  TESSALON PERLES) 100 MG capsule Take 1 capsule (100 mg total) by mouth every 6 (six) hours as needed for cough. 12/13/20 12/13/21  Fransico Meadow, PA-C  celecoxib (CELEBREX) 100 MG capsule Take 1 capsule (100 mg total) by mouth 2 (two) times daily. 04/25/20   Magnant, Charles L, PA-C  Erenumab-aooe (AIMOVIG) 140 MG/ML SOAJ Inject 140 mg into the skin every 28 (twenty-eight) days. 10/17/20   Pieter Partridge, DO  gabapentin (NEURONTIN) 300 MG capsule Take 1 capsule by mouth at bedtime 03/20/21   Charlott Rakes, MD   HYDROcodone-acetaminophen (NORCO) 5-325 MG tablet Take 1-2 tablets by mouth 2 (two) times daily as needed. 07/13/20   Aundra Dubin, PA-C  ISOtretinoin (ACCUTANE) 30 MG capsule Take 1 capsule (30 mg total) by mouth daily. 05/25/20   Sheffield, Ronalee Red, PA-C  oxyCODONE-acetaminophen (PERCOCET) 5-325 MG tablet Take 1-2 tablets by mouth every 8 (eight) hours as needed for severe pain. Patient not taking: Reported on 10/17/2020 06/21/20   Leandrew Koyanagi, MD  oxyCODONE-acetaminophen (PERCOCET) 5-325 MG tablet Take 1 tablet by mouth every 8 (eight) hours as needed. Patient not taking: Reported on 10/17/2020 06/28/20   Aundra Dubin, PA-C  oxyCODONE-acetaminophen (PERCOCET) 5-325 MG tablet Take 1 tablet by mouth every 8 (eight) hours as needed. Patient not taking: Reported on 10/17/2020 07/06/20   Aundra Dubin, PA-C  QUEtiapine (SEROQUEL) 100 MG tablet Take 1 tablet (100 mg total) by mouth at bedtime. 11/14/20   Charlott Rakes, MD  tiZANidine (ZANAFLEX) 4 MG tablet Take 1 tablet (4 mg total) by mouth every 8 (eight) hours as needed for muscle spasms. 11/14/20   Charlott Rakes, MD  Ubrogepant (UBRELVY) 100 MG TABS Take 1 tablet by mouth as needed (May repeat 1 tablet in 2 hours.  Maximum 2 tablets in 24 hours.). Patient not taking: Reported on 10/17/2020 01/18/20   Pieter Partridge, DO  famotidine (PEPCID) 40 MG tablet Take 1 tablet (40 mg total) by mouth daily. 08/26/18 12/28/19  Robyn Haber, MD    Family History Family History  Problem Relation Age of Onset   Diabetes Mother    Kidney cancer Mother    Diabetes Maternal Grandmother    Cancer Maternal Grandmother        unknown type. doing chemo   Cancer Maternal Grandfather    Diabetes Maternal Aunt    Stomach cancer Neg Hx    Pancreatic cancer Neg Hx    Esophageal cancer Neg Hx    Liver disease Neg Hx    Colon cancer Neg Hx    Rectal cancer Neg Hx     Social History Social History   Tobacco Use   Smoking status: Every Day    Packs/day:  1.00    Years: 15.00    Pack years: 15.00    Types: Cigarettes   Smokeless tobacco: Former  Scientific laboratory technician Use: Former  Substance Use Topics   Alcohol use: Not Currently   Drug use: Not Currently     Allergies   Cyclobenzaprine, Depakote [divalproex sodium], Ritalin [methylphenidate hcl], Naproxen, Other, and Tramadol   Review of Systems Review of Systems Per HPI  Physical Exam Triage Vital Signs ED Triage Vitals  Enc Vitals Group     BP 04/20/21 1553 (!) 148/84     Pulse Rate 04/20/21 1553 90     Resp 04/20/21 1553 18     Temp 04/20/21 1553 98.3 F (36.8 C)     Temp Source 04/20/21  1553 Oral     SpO2 04/20/21 1553 95 %     Weight --      Height --      Head Circumference --      Peak Flow --      Pain Score 04/20/21 1554 9     Pain Loc --      Pain Edu? --      Excl. in [place]? --    No data found.  Updated Vital Signs BP (!) 148/84 (BP Location: Right Arm)    Pulse 90    Temp 98.3 F (36.8 C) (Oral)    Resp 18    SpO2 95%   Visual Acuity Right Eye Distance:   Left Eye Distance:   Bilateral Distance:    Right Eye Near:   Left Eye Near:    Bilateral Near:     Physical Exam Vitals and nursing note reviewed.  Constitutional:      Appearance: Normal appearance.  HENT:     Head: Atraumatic.  Eyes:     Extraocular Movements: Extraocular movements intact.     Conjunctiva/sclera: Conjunctivae normal.  Cardiovascular:     Rate and Rhythm: Normal rate and regular rhythm.  Pulmonary:     Effort: Pulmonary effort is normal.     Breath sounds: Normal breath sounds.  Musculoskeletal:        General: Tenderness present. No swelling, deformity or signs of injury. Normal range of motion.     Cervical back: Normal range of motion and neck supple.     Comments: Localized area of tenderness just below the left shoulder joint over the biceps insertion.  No edema, deformity palpable.  Grip strength full and equal both hands, range of motion full and intact left  upper extremity  Skin:    General: Skin is warm and dry.     Findings: No bruising, erythema or rash.  Neurological:     General: No focal deficit present.     Mental Status: He is oriented to person, place, and time.     Comments: Left upper extremity neurovascularly intact  Psychiatric:        Mood and Affect: Mood normal.        Thought Content: Thought content normal.        Judgment: Judgment normal.   UC Treatments / Results  Labs (all labs ordered are listed, but only abnormal results are displayed) Labs Reviewed - No data to display  EKG  Radiology No results found.  Procedures Procedures (including critical care time)  Medications Ordered in UC Medications - No data to display  Initial Impression / Assessment and Plan / UC Course  I have reviewed the triage vital signs and the nursing notes.  Pertinent labs & imaging results that were available during my care of the patient were reviewed by me and considered in my medical decision making (see chart for details).     Suspect shoulder strain, will try course of prednisone in addition to his methocarbamol, heat, rest, massage.  Follow-up with orthopedics next week for recheck.  Work note given.  Final Clinical Impressions(s) / UC Diagnoses   Final diagnoses:  Strain of left shoulder, initial encounter     Discharge Instructions      Follow-up with your orthopedic specialist next week to recheck the area    ED Prescriptions     Medication Sig Dispense Auth. Provider   predniSONE (DELTASONE) 20 MG tablet Take 2 tablets (40 mg total)  by mouth daily with breakfast. 10 tablet Volney American, [place]      PDMP not reviewed this encounter.   Merrie Roof [place], [place] 04/20/21 [phone]

## 2021-04-20 NOTE — ED Triage Notes (Signed)
Pt reports he woke up with pain in the front part of left shoulder and  left bicep x 1 day. Hydrocodone and Tylenol gives no relief.

## 2021-04-25 ENCOUNTER — Ambulatory Visit (HOSPITAL_COMMUNITY)
Admission: RE | Admit: 2021-04-25 | Discharge: 2021-04-25 | Disposition: A | Discharge status: Home / Self Care | Payer: Self-pay | Source: Ambulatory Visit | Attending: Neurology | Admitting: Neurology

## 2021-04-25 ENCOUNTER — Other Ambulatory Visit (HOSPITAL_COMMUNITY): Payer: Self-pay | Admitting: Neurology

## 2021-04-25 ENCOUNTER — Other Ambulatory Visit: Payer: Self-pay

## 2021-04-25 DIAGNOSIS — M25512 Pain in left shoulder: Secondary | ICD-10-CM | POA: Insufficient documentation

## 2021-05-15 ENCOUNTER — Other Ambulatory Visit: Payer: Self-pay

## 2021-05-15 ENCOUNTER — Ambulatory Visit
Admission: EM | Admit: 2021-05-15 | Discharge: 2021-05-15 | Disposition: A | Discharge status: Home / Self Care | Payer: 59 | Attending: Student | Admitting: Student

## 2021-05-15 DIAGNOSIS — Z112 Encounter for screening for other bacterial diseases: Secondary | ICD-10-CM | POA: Diagnosis present

## 2021-05-15 DIAGNOSIS — B349 Viral infection, unspecified: Secondary | ICD-10-CM

## 2021-05-15 DIAGNOSIS — Z20828 Contact with and (suspected) exposure to other viral communicable diseases: Secondary | ICD-10-CM

## 2021-05-15 DIAGNOSIS — Z9189 Other specified personal risk factors, not elsewhere classified: Secondary | ICD-10-CM

## 2021-05-15 LAB — POCT RAPID STREP A (OFFICE): Rapid Strep A Screen: NEGATIVE

## 2021-05-15 MED ORDER — OSELTAMIVIR PHOSPHATE 75 MG PO CAPS: 75.0000 mg | ORAL_CAPSULE | Freq: Two times a day (BID) | ORAL | 0 refills | Status: DC

## 2021-05-15 NOTE — ED Triage Notes (Signed)
Pt reports on and off cough, sore throat and chest congestion x 3 days. Pt has not taken any OTC meds.

## 2021-05-15 NOTE — ED Provider Notes (Signed)
[facility]    CSN: [id number] Arrival date & time: 05/15/21  1531      History   Chief Complaint Chief Complaint  Patient presents with   Cough   Sore Throat         HPI Jimmy [name] is a 31 y.o. male presenting with sore throat, cough, congestion, myalgias following exposure to influenza.  History noncontributory.  Has not attempted any medications.  Has not monitored his temperature at home. States he needs a work note.  HPI  Past Medical History:  Diagnosis Date   ADHD (attention deficit hyperactivity disorder)    Anxiety    Arthritis    Cancer ([place])    skin cancer   Chest pain 08/2018   GERD (gastroesophageal reflux disease)    no meds   History of COVID-19 04/05/2020   Migraine    migraines due to pinched nerve   Pinched nerve in neck    Scoliosis     Patient Active Problem List   Diagnosis Date Noted   History of Abnormal LFTs (liver function tests) 12/30/2019   Abnormal liver ultrasound 12/30/2019   Fatty liver 12/30/2019   Right flank pain 12/30/2019   Abdominal wall pain 12/30/2019   Gastroesophageal reflux disease without esophagitis 12/30/2019   Superior labrum anterior-to-posterior (SLAP) tear of left shoulder 10/01/2019   Carpal tunnel syndrome 09/08/2019   Bursitis of shoulder, right 08/31/2019   Incisional hernia, without obstruction or gangrene 05/20/2018   Opiate dependence ([place]) 08/02/2013    Past Surgical History:  Procedure Laterality Date   BICEPT TENODESIS Left 06/21/2020   Procedure: BICEPS TENODESIS TENOLYSIS;  Surgeon: Leandrew Koyanagi, MD;  Location: [place];  Service: Orthopedics;  Laterality: Left;   ESOPHAGUS SURGERY     as a baby   SHOULDER ARTHROSCOPY WITH BICEPS TENDON REPAIR Left 03/01/2020   Procedure: LEFT SHOULDER ARTHROSCOPY WITH BICEPS TENODESIS;  Surgeon: Leandrew Koyanagi, MD;  Location: [place];  Service: Orthopedics;  Laterality: Left;   SHOULDER ARTHROSCOPY WITH  SUBACROMIAL DECOMPRESSION Right 09/01/2019   Procedure: RIGHT SHOULDER ARTHROSCOPY WITH EXTENSIVE DEBRIDEMENT AND SUBACROMIAL DECOMPRESSION;  Surgeon: Leandrew Koyanagi, MD;  Location: [place];  Service: Orthopedics;  Laterality: Right;   VENTRAL HERNIA REPAIR N/A 08/31/2018   Procedure: HERNIA REPAIR VENTRAL ADULT;  Surgeon: Olean Ree, MD;  Location: [facility];  Service: General;  Laterality: N/A;       Home Medications    Prior to Admission medications   Medication Sig Start Date End Date Taking? Authorizing Provider  oseltamivir (TAMIFLU) 75 MG capsule Take 1 capsule (75 mg total) by mouth every 12 (twelve) hours. 05/15/21  Yes Hazel Sams, PA-C  ISOtretinoin (ACCUTANE) 30 MG capsule Take 1 capsule (30 mg total) by mouth daily. 05/25/20   Sheffield, Ronalee Red, PA-C  QUEtiapine (SEROQUEL) 100 MG tablet Take 1 tablet (100 mg total) by mouth at bedtime. 11/14/20   Charlott Rakes, MD  famotidine (PEPCID) 40 MG tablet Take 1 tablet (40 mg total) by mouth daily. 08/26/18 12/28/19  Robyn Haber, MD    Family History Family History  Problem Relation Age of Onset   Diabetes Mother    Kidney cancer Mother    Diabetes Maternal Grandmother    Cancer Maternal Grandmother        unknown type. doing chemo   Cancer Maternal Grandfather    Diabetes Maternal Aunt    Stomach cancer Neg Hx    Pancreatic  cancer Neg Hx    Esophageal cancer Neg Hx    Liver disease Neg Hx    Colon cancer Neg Hx    Rectal cancer Neg Hx     Social History Social History   Tobacco Use   Smoking status: Every Day    Packs/day: 1.00    Years: 15.00    Pack years: 15.00    Types: Cigarettes   Smokeless tobacco: Former  Scientific laboratory technician Use: Former  Substance Use Topics   Alcohol use: Not Currently   Drug use: Not Currently     Allergies   Cyclobenzaprine, Depakote [divalproex sodium], Ritalin [methylphenidate hcl], Naproxen, Other, and Tramadol   Review of Systems Review of  Systems  Constitutional:  Negative for appetite change, chills and fever.  HENT:  Positive for congestion and sore throat. Negative for ear pain, rhinorrhea, sinus pressure and sinus pain.   Eyes:  Negative for redness and visual disturbance.  Respiratory:  Positive for cough. Negative for chest tightness, shortness of breath and wheezing.   Cardiovascular:  Negative for chest pain and palpitations.  Gastrointestinal:  Negative for abdominal pain, constipation, diarrhea, nausea and vomiting.  Genitourinary:  Negative for dysuria, frequency and urgency.  Musculoskeletal:  Negative for myalgias.  Neurological:  Negative for dizziness, weakness and headaches.  Psychiatric/Behavioral:  Negative for confusion.   All other systems reviewed and are negative.   Physical Exam Triage Vital Signs ED Triage Vitals  Enc Vitals Group     BP 05/15/21 1540 (!) 149/79     Pulse Rate 05/15/21 1540 98     Resp 05/15/21 1540 18     Temp 05/15/21 1540 98.2 F (36.8 C)     Temp Source 05/15/21 1540 Oral     SpO2 05/15/21 1540 97 %     Weight --      Height --      Head Circumference --      Peak Flow --      Pain Score 05/15/21 1541 8     Pain Loc --      Pain Edu? --      Excl. in [place]? --    No data found.  Updated Vital Signs BP (!) 149/79 (BP Location: Right Arm)    Pulse 98    Temp 98.2 F (36.8 C) (Oral)    Resp 18    SpO2 97%   Visual Acuity Right Eye Distance:   Left Eye Distance:   Bilateral Distance:    Right Eye Near:   Left Eye Near:    Bilateral Near:     Physical Exam Vitals reviewed.  Constitutional:      General: He is not in acute distress.    Appearance: Normal appearance. He is not ill-appearing.  HENT:     Head: Normocephalic and atraumatic.     Right Ear: Tympanic membrane, ear canal and external ear normal. No tenderness. No middle ear effusion. There is no impacted cerumen. Tympanic membrane is not perforated, erythematous, retracted or bulging.     Left Ear:  Tympanic membrane, ear canal and external ear normal. No tenderness.  No middle ear effusion. There is no impacted cerumen. Tympanic membrane is not perforated, erythematous, retracted or bulging.     Nose: Nose normal. No congestion.     Mouth/Throat:     Mouth: Mucous membranes are moist.     Pharynx: Uvula midline. Posterior oropharyngeal erythema present. No oropharyngeal exudate.     Tonsils:  1+ on the right. 1+ on the left.     Comments: Smooth erythema posterior pharynx. Uvula midline. Normal phonation. Eyes:     Extraocular Movements: Extraocular movements intact.     Pupils: Pupils are equal, round, and reactive to light.  Cardiovascular:     Rate and Rhythm: Normal rate and regular rhythm.     Heart sounds: Normal heart sounds.  Pulmonary:     Effort: Pulmonary effort is normal.     Breath sounds: Normal breath sounds. No decreased breath sounds, wheezing, rhonchi or rales.  Abdominal:     Palpations: Abdomen is soft.     Tenderness: There is no abdominal tenderness. There is no guarding or rebound.  Lymphadenopathy:     Cervical: No cervical adenopathy.     Right cervical: No superficial cervical adenopathy.    Left cervical: No superficial cervical adenopathy.  Neurological:     General: No focal deficit present.     Mental Status: He is alert and oriented to person, place, and time.  Psychiatric:        Mood and Affect: Mood normal.        Behavior: Behavior normal.        Thought Content: Thought content normal.        Judgment: Judgment normal.     UC Treatments / Results  Labs (all labs ordered are listed, but only abnormal results are displayed) Labs Reviewed  COVID-19, FLU A+B NAA  CULTURE, GROUP A STREP [facility])  POCT RAPID STREP A (OFFICE)    EKG   Radiology No results found.  Procedures Procedures (including critical care time)  Medications Ordered in UC Medications - No data to display  Initial Impression / Assessment and Plan / UC Course  I  have reviewed the triage vital signs and the nursing notes.  Pertinent labs & imaging results that were available during my care of the patient were reviewed by me and considered in my medical decision making (see chart for details).     This patient is a very pleasant 31 y.o. year old male presenting with viral syndrome following exposure to influenza. Today this pt is afebrile nontachycardic nontachypneic, oxygenating well on room air, no wheezes rhonchi or rales. Has not attempted any medications.  Rapid strep negative, culture sent.  Covid and influenza PCR sent.   Given exposure to influenza, tamiflu sent.   Work note provided. ED return precautions discussed. Patient verbalizes understanding and agreement.     Final Clinical Impressions(s) / UC Diagnoses   Final diagnoses:  At increased risk of exposure to COVID-19 virus  Screening for streptococcal infection  Exposure to influenza  Viral syndrome     Discharge Instructions      -Your strep test was negative  -Tamiflu twice daily x5 days.  -For fevers/chills, bodyaches, headaches- You can take Tylenol up to 1000 mg 3 times daily, and ibuprofen up to 600 mg 3 times daily with food.  You can take these together, or alternate every 3-4 hours. -Drink plenty of water/gatorade and get plenty of rest -With a virus, you're typically contagious for 5-7 days, or as long as you're having fevers.  -Come back and see [place] if things are getting worse instead of better, like shortness of breath, chest pain, fevers and chills that are getting higher instead of lower and do not come down with Tylenol or ibuprofen, etc.    ED Prescriptions     Medication Sig Dispense Auth. Provider   oseltamivir (  TAMIFLU) 75 MG capsule Take 1 capsule (75 mg total) by mouth every 12 (twelve) hours. 10 capsule Hazel Sams, PA-C      PDMP not reviewed this encounter.   Hazel Sams, PA-C 05/15/21 1610

## 2021-05-15 NOTE — Discharge Instructions (Addendum)
-  Your strep test was negative  -Tamiflu twice daily x5 days.  -For fevers/chills, bodyaches, headaches- You can take Tylenol up to 1000 mg 3 times daily, and ibuprofen up to 600 mg 3 times daily with food.  You can take these together, or alternate every 3-4 hours. -Drink plenty of water/gatorade and get plenty of rest -With a virus, you're typically contagious for 5-7 days, or as long as you're having fevers.  -Come back and see [place] if things are getting worse instead of better, like shortness of breath, chest pain, fevers and chills that are getting higher instead of lower and do not come down with Tylenol or ibuprofen, etc.

## 2021-05-16 ENCOUNTER — Telehealth: Payer: Self-pay | Admitting: Neurology

## 2021-05-16 ENCOUNTER — Telehealth: Payer: Self-pay

## 2021-05-16 LAB — COVID-19, FLU A+B NAA
Influenza A, NAA: NOT DETECTED
Influenza B, NAA: NOT DETECTED
SARS-CoV-2, NAA: NOT DETECTED

## 2021-05-16 NOTE — Telephone Encounter (Signed)
New message  . Jimmy [name] (Key: [id number]) Aimovig 140MG /ML auto-injectors   Form [employer] [place] [employer] Electronic Request Form (CB) Created 1 month ago Sent to Plan 3 minutes ago Plan Response 3 minutes ago Submit Clinical Questions less than a minute ago Determination Wait for Determination Please wait for [employer] to return a determination.   our information has been submitted to [place]. [employer] [place] will review the request and notify you of the determination decision directly, typically within 72 hours of receiving all information.  You will also receive your request decision electronically. To check for an update later, open this request again from your dashboard.  If [employer] has not responded within the specified timeframe or if you have any questions about your PA submission, contact Rupert Oatman directly at [phone].

## 2021-05-16 NOTE — Telephone Encounter (Signed)
Pt called in and gave his new insurance information. He needs a prior auth for his Aimovig ?

## 2021-05-18 LAB — CULTURE, GROUP A STREP (THRC)

## 2021-05-22 ENCOUNTER — Ambulatory Visit: Payer: Self-pay | Admitting: Family Medicine
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
## 2021-05-22 NOTE — Telephone Encounter (Signed)
F/u  Paperwork needs Dr. Tomi Likens signature fax back to [employer]

## 2021-05-26 ENCOUNTER — Encounter: Payer: Self-pay | Admitting: Neurology

## 2021-05-29 DIAGNOSIS — M542 Cervicalgia: Secondary | ICD-10-CM | POA: Diagnosis not present

## 2021-05-29 DIAGNOSIS — Z79891 Long term (current) use of opiate analgesic: Secondary | ICD-10-CM | POA: Diagnosis not present

## 2021-05-29 DIAGNOSIS — M25512 Pain in left shoulder: Secondary | ICD-10-CM | POA: Diagnosis not present

## 2021-05-29 DIAGNOSIS — M545 Low back pain, unspecified: Secondary | ICD-10-CM | POA: Diagnosis not present

## 2021-05-29 DIAGNOSIS — M79602 Pain in left arm: Secondary | ICD-10-CM | POA: Diagnosis not present

## 2021-05-29 DIAGNOSIS — M25511 Pain in right shoulder: Secondary | ICD-10-CM | POA: Diagnosis not present

## 2021-05-31 NOTE — Progress Notes (Signed)
Forms filled out for [facility] for Aimovig 140 mg. ?

## 2021-06-05 ENCOUNTER — Other Ambulatory Visit: Payer: Self-pay

## 2021-06-05 ENCOUNTER — Ambulatory Visit
Admission: EM | Admit: 2021-06-05 | Discharge: 2021-06-05 | Disposition: A | Discharge status: Home / Self Care | Payer: 59 | Attending: Family Medicine | Admitting: Family Medicine

## 2021-06-05 DIAGNOSIS — M545 Low back pain, unspecified: Secondary | ICD-10-CM | POA: Diagnosis not present

## 2021-06-05 DIAGNOSIS — Z20822 Contact with and (suspected) exposure to covid-19: Secondary | ICD-10-CM | POA: Diagnosis not present

## 2021-06-05 DIAGNOSIS — G8929 Other chronic pain: Secondary | ICD-10-CM | POA: Diagnosis not present

## 2021-06-05 LAB — POCT URINALYSIS DIP (MANUAL ENTRY)
Bilirubin, UA: NEGATIVE
Blood, UA: NEGATIVE
Glucose, UA: NEGATIVE mg/dL
Ketones, POC UA: NEGATIVE mg/dL
Leukocytes, UA: NEGATIVE
Nitrite, UA: NEGATIVE
Protein Ur, POC: NEGATIVE mg/dL
Spec Grav, UA: 1.02 (ref 1.010–1.025)
Urobilinogen, UA: 1 E.U./dL
pH, UA: 7 (ref 5.0–8.0)

## 2021-06-05 MED ORDER — PREDNISONE 20 MG PO TABS: 40.0000 mg | ORAL_TABLET | Freq: Every day | ORAL | 0 refills | Status: DC

## 2021-06-05 NOTE — ED Provider Notes (Signed)
?[place] ? ? ?[id number] ?06/05/21 Arrival Time: 2831 ? ?ASSESSMENT & PLAN: ? ?1. Chronic right-sided low back pain without sciatica   ?2. Exposure to COVID-19 virus   ? ?Acute on chronic. No trauma. ? ?Labs Reviewed  ?COVID-19, FLU A+B NAA  ?POCT URINALYSIS DIP (MANUAL ENTRY)  ?U/A normal. Viral testing pending. ? ?Able to ambulate here and hemodynamically stable. ?No indication for imaging of back at this time given no trauma and normal neurological exam. Discussed. ?Has muscle relaxer at home to take as directed. ?Begin trial of: ?Meds ordered this encounter  ?Medications  ? predniSONE (DELTASONE) 20 MG tablet  ?  Sig: Take 2 tablets (40 mg total) by mouth daily.  ?  Dispense:  10 tablet  ?  Refill:  0  ? ?Work note provided. ?Encourage ROM/movement as tolerated. ? ?May end up needing PT. Discussed. ?Recommend: ? Follow-up Information   ? ? Schedule an appointment as soon as possible for a visit  with Charlott Rakes, MD.   ?Specialty: Family Medicine ?Contact information: ?[address] ?[address] ?[name] [address] ?[phone] ? ? ?  ?  ? ?  ?  ? ?  ? ? ?Reviewed expectations re: course of current medical issues. Questions answered. ?Outlined signs and symptoms indicating need for more acute intervention. ?Patient verbalized understanding. ?After Visit Summary given. ? ? ?SUBJECTIVE: ?History from: patient. ? ?Jimmy [name] is a 31 y.o. male who presents with complaint of fairly persistent right sided lower back discomfort. Onset gradual. First noted  a few days ago; acute on chronic; pain on/off over past two years; same location . Injury/trama: no. History of back problems requiring medical care: occasional. Pain described as aching and without radiation. ?Aggravating factors: certain movements. Alleviating factors: have not been identified. Progressive LE weakness or saddle anesthesia: none. Extremity sensation changes or weakness: none. Ambulatory without difficulty. Normal  bowel/bladder habits: yes; without urinary retention. Normal PO intake without n/v. No associated abdominal pain/n/v. Self treatment: has muscle relaxer with minimal help ? ?Reports no chronic steroid use, fevers, IV drug use, or recent back surgeries or procedures. ? ?OBJECTIVE: ? ?Vitals:  ? 06/05/21 1039  ?BP: 130/89  ?Pulse: 86  ?Resp: 20  ?Temp: 98.2 ?F (36.8 ?C)  ?TempSrc: Oral  ?SpO2: 96%  ?  ?General appearance: alert; no distress ?HEENT: [place]; AT ?Neck: supple with FROM; without midline tenderness ?CV: regular ?Lungs: unlabored respirations; speaks full sentences without difficulty ?Abdomen: soft, non-tender; non-distended ?Back: moderate and poorly localized tenderness to palpation over R paraspinal musculature ; FROM at waist; bruising: none; without midline tenderness ?Extremities: without edema; symmetrical without gross deformities; normal ROM of bilateral LE ?Skin: warm and dry ?Neurologic: normal gait; normal sensation and strength of bilateral LE ?Psychological: alert and cooperative; normal mood and affect ? ?Labs: ?Results for orders placed or performed during the hospital encounter of 06/05/21  ?POCT urinalysis dipstick  ?Result Value Ref Range  ? Color, UA yellow yellow  ? Clarity, UA clear clear  ? Glucose, UA negative negative mg/dL  ? Bilirubin, UA negative negative  ? Ketones, POC UA negative negative mg/dL  ? Spec Grav, UA 1.020 1.010 - 1.025  ? Blood, UA negative negative  ? pH, UA 7.0 5.0 - 8.0  ? Protein Ur, POC negative negative mg/dL  ? Urobilinogen, UA 1.0 0.2 or 1.0 E.U./dL  ? Nitrite, UA Negative Negative  ? Leukocytes, UA Negative Negative  ? ?Labs Reviewed  ?COVID-19, FLU A+B NAA  ?POCT  URINALYSIS DIP (MANUAL ENTRY)  ? ? ? ?Allergies  ?Allergen Reactions  ? Cyclobenzaprine Hives and Nausea Only  ? Depakote [Divalproex Sodium] Other (See Comments)  ?  "made him crazy"  ? Ritalin [Methylphenidate Hcl] Other (See Comments)  ?  Increased hyper activeness   ? Naproxen Nausea Only  ?   Acid reflux  ? Other Other (See Comments)  ?  [facility]-pt states skin became reddened and he tasted metal (08-2018)  ? Tramadol Other (See Comments)  ?  Acid reflux  ? ? ?Past Medical History:  ?Diagnosis Date  ? ADHD (attention deficit hyperactivity disorder)   ? Anxiety   ? Arthritis   ? Cancer [facility])   ? skin cancer  ? Chest pain 08/2018  ? GERD (gastroesophageal reflux disease)   ? no meds  ? History of COVID-19 04/05/2020  ? Migraine   ? migraines due to pinched nerve  ? Pinched nerve in neck   ? Scoliosis   ? ?Social History  ? ?Socioeconomic History  ? Marital status: Married  ?  Spouse name: Not on file  ? Number of children: Not on file  ? Years of education: Not on file  ? Highest education level: Not on file  ?Occupational History  ? Not on file  ?Tobacco Use  ? Smoking status: Every Day  ?  Packs/day: 1.00  ?  Years: 15.00  ?  Pack years: 15.00  ?  Types: Cigarettes  ?  Passive exposure: Never  ? Smokeless tobacco: Former  ?Vaping Use  ? Vaping Use: Never used  ?Substance and Sexual Activity  ? Alcohol use: Not Currently  ? Drug use: Not Currently  ? Sexual activity: Yes  ?Other Topics Concern  ? Not on file  ?Social History Narrative  ? Right handed  ? One story home   ? Drinks caffeine   ? ?Social Determinants of Health  ? ?Financial Resource Strain: Not on file  ?Food Insecurity: Not on file  ?Transportation Needs: Not on file  ?Physical Activity: Not on file  ?Stress: Not on file  ?Social Connections: Not on file  ?Intimate Partner Violence: Not on file  ? ?Family History  ?Problem Relation Age of Onset  ? Diabetes Mother   ? Kidney cancer Mother   ? Diabetes Maternal Grandmother   ? Cancer Maternal Grandmother   ?     unknown type. doing chemo  ? Cancer Maternal Grandfather   ? Diabetes Maternal Aunt   ? Stomach cancer Neg Hx   ? Pancreatic cancer Neg Hx   ? Esophageal cancer Neg Hx   ? Liver disease Neg Hx   ? Colon cancer Neg Hx   ? Rectal cancer Neg Hx   ? ?Past Surgical History:  ?Procedure  Laterality Date  ? BICEPT TENODESIS Left 06/21/2020  ? Procedure: BICEPS TENODESIS TENOLYSIS;  Surgeon: Leandrew Koyanagi, MD;  Location: [place];  Service: Orthopedics;  Laterality: Left;  ? ESOPHAGUS SURGERY    ? as a baby  ? SHOULDER ARTHROSCOPY WITH BICEPS TENDON REPAIR Left 03/01/2020  ? Procedure: LEFT SHOULDER ARTHROSCOPY WITH BICEPS TENODESIS;  Surgeon: Leandrew Koyanagi, MD;  Location: [place];  Service: Orthopedics;  Laterality: Left;  ? SHOULDER ARTHROSCOPY WITH SUBACROMIAL DECOMPRESSION Right 09/01/2019  ? Procedure: RIGHT SHOULDER ARTHROSCOPY WITH EXTENSIVE DEBRIDEMENT AND SUBACROMIAL DECOMPRESSION;  Surgeon: Leandrew Koyanagi, MD;  Location: [place];  Service: Orthopedics;  Laterality: Right;  ? VENTRAL HERNIA REPAIR N/A  08/31/2018  ? Procedure: HERNIA REPAIR VENTRAL ADULT;  Surgeon: Olean Ree, MD;  Location: [facility];  Service: General;  Laterality: N/A;  ? ? ?  ?Vanessa Kick, MD ?06/05/21 1247 ? ?

## 2021-06-05 NOTE — Discharge Instructions (Signed)

## 2021-06-05 NOTE — ED Triage Notes (Signed)
Pt states he has been having pain in his right side going into the right lower back started about a month ago ? ?Pt states he is also having a cough and nose congestion since Friday ? ?Denies Fever ?

## 2021-06-06 LAB — COVID-19, FLU A+B NAA
Influenza A, NAA: NOT DETECTED
Influenza B, NAA: NOT DETECTED
SARS-CoV-2, NAA: NOT DETECTED

## 2021-06-13 ENCOUNTER — Ambulatory Visit: Payer: Self-pay

## 2021-06-13 NOTE — Telephone Encounter (Signed)
?  Chief Complaint: lice ?Symptoms: head lice ?Frequency: several weeks ?Pertinent Negatives: Patient denies scalp rash ?Disposition: '[]'$ ED /'[]'$ Urgent Care (no appt availability in office) / '[]'$ Appointment(In office/virtual)/ '[]'$  [facility] [facility]/ '[]'$ Home Care/ '[]'$ Refused Recommended Disposition /'[]'$  Mobile Bus/ '[x]'$  Follow-up with PCP ?Additional Notes: attempted to schedule video visit but pt didn't want to do that. He states he works all the time and is on a piece of equipment so its hard for him to do an appt. Is wanting [employer] Shampoo sent if possible.  ? ?Summary: lice  ? Pt called in stating he has been trying multiple lice shampoos and needed to speak with a nurse about other medical shampoos he can use, and to get more information about [employer]ba Shampoo, please advise.  ?  ? ? ?Reason for Disposition ? [0] New-onset head lice AND [0] much resistance to over-the-counter meds in community ? ?Answer Assessment - Initial Assessment Questions ?1. LICE APPEARANCE: "Have you seen any lice?" If Yes, ask: "What do they look like?" (Correct answer: A gray bug that's 1/16 inch or 2 millimeters long)  ?    yes ?2. NITS APPEARANCE: "Have you seen any eggs (nits) in the hair?" If Yes, ask: "What do they look like?" (Correct answer: white or tan eggs attached to hair shafts) ?    yes ?3. ONSET: "How long have the eggs been present?"  ?    Over a couple weeks  ?4. ITCH: "Is the scalp itchy?" If Yes, ask: "How bad is the itch?"  ?    yes ?5. RASH: "Is there a rash?" If Yes, ask: "What does it look like?"  ?    No ?6. TREATMENT:  "What treatment have you tried?" "What happened?" ?    OTC shampoos 4 different ones. ? ?Protocols used: Head Lice-A-AH ? ?

## 2021-06-14 NOTE — Telephone Encounter (Signed)
Please place on my schedule for virtual visit today. ?

## 2021-06-14 NOTE — Telephone Encounter (Signed)
Call placed to patient and no VM is set up to leave a VM. ?

## 2021-06-28 DIAGNOSIS — M25511 Pain in right shoulder: Secondary | ICD-10-CM | POA: Diagnosis not present

## 2021-06-28 DIAGNOSIS — Z79891 Long term (current) use of opiate analgesic: Secondary | ICD-10-CM | POA: Diagnosis not present

## 2021-06-28 DIAGNOSIS — M25512 Pain in left shoulder: Secondary | ICD-10-CM | POA: Diagnosis not present

## 2021-06-28 DIAGNOSIS — M545 Low back pain, unspecified: Secondary | ICD-10-CM | POA: Diagnosis not present

## 2021-06-28 DIAGNOSIS — M79602 Pain in left arm: Secondary | ICD-10-CM | POA: Diagnosis not present

## 2021-06-28 DIAGNOSIS — M542 Cervicalgia: Secondary | ICD-10-CM | POA: Diagnosis not present

## 2021-07-17 NOTE — Progress Notes (Deleted)
NEUROLOGY FOLLOW UP OFFICE NOTE  Jimmy [name] [id number]  Assessment/Plan:   Migraine without aura, without status migrainosus, not intractable - improved but I think we can optimize efficacy   1. Aimovig to '140mg'$  2.  Limit use of pain relievers to no more than 2 days out of week to prevent risk of rebound or medication-overuse headache. 4.  Keep headache diary 5.  Follow up in 9 months.   Subjective:  Jimmy [name] is a 31 year old male with Bipolar disorder who follows up for migraines.   UPDATE: Increased Aimovig to '140mg'$ . Intensity:  Mild Duration: a couple of hours Frequency: off and on everyday beginning 7 to 10 days prior to next Aimovig injection     Current NSAIDS:  Unable to take due to GI ulcers Current analgesics:  Tylenol #3 (shoulder) Current triptans: none Current ergotamine:  none Current anti-emetic:  none Current muscle relaxants: tizanidine  Current anti-anxiolytic:  none Current sleep aide:  none Current Antihypertensive medications:  none Current Antidepressant/antipsychotic medications:  Seroquel '100mg'$  at bedtime Current Anticonvulsant medications:  none Current anti-CGRP:  Aimovig '140mg'$  every 28 days Current Vitamins/Herbal/Supplements:  none Current Antihistamines/Decongestants:  none Other therapy:  none Hormone/birth control:  none   Caffeine:  1 cup of coffee daily; no soda Alcohol:  no Smoker:  1 ppd Diet:  No soda.  8 bottles of water daily. Exercise:  No routine exercise Depression:  yes; Anxiety:  yes Other pain:  shoulder pain s/p shoulder surgery - still hurts Sleep:  Good with quetiapine   HISTORY:  He has history of headaches since 31 years old.  They are left or right-sided pounding/pressure/stabbing pain associated with photophobia, phonophobia but not nausea, vomiting, visual disturbance, paresthesias or unilateral numbness or weakness.  He reports that they are triggered by a "pinched nerve in my neck".  He has  associated bilateral neck and shoulder pain.  Cervical X-ray from 05/26/2013 personally reviewed did show mild disc space narrowing at C5-6 but otherwise unremarkable.  No radicular pain, numbness or weakness in the upper extremities.  They are typically persistent, lasting weeks to months.  They may be severe for 2 week periods.  It is usually 4-5/10 intensity but 8/10 when severe.  3 to 4 months he is headache-free between episodes.   Nothing relieves the headache.    01/08/2011 CT BRAIN WO:  No acute intracranial abnormality 07/10/2019 MRI CERVICAL SPINE: right eccentric disc bulge at C3-4 causing mild canal and right C4 foraminal stenosis, as well as mild right C3 and C5 foraminal stenosis.     Past NSAIDS:  Unable to take due to GI ulcers Past analgesics:  Tylenol Past abortive triptans:  Sumatriptan '50mg'$ , rizatriptan '10mg'$  Past abortive ergotamine:  none Past muscle relaxants:  Flexeril, Robaxin Past anti-emetic:  none Past antihypertensive medications:  none Past antidepressant/antipsychotic medications: amitriptyline '10mg'$  (caused metallic taste);  nortriptyline (bad taste); quetiapine '100mg'$  at bedtime Past anticonvulsant medications:  topiramate '50mg'$  twice daily (caused metallic taste), gabapentin '300mg'$  TID Past anti-CGRP:  Ubrelvy (effective but not covered by insurance) Past vitamins/Herbal/Supplements:  none Past antihistamines/decongestants:  none Other past therapies:  none     Family history of headache:  Mother, aunt, sister  PAST MEDICAL HISTORY: Past Medical History:  Diagnosis Date   ADHD (attention deficit hyperactivity disorder)    Anxiety    Arthritis    Cancer ([place])    skin cancer   Chest pain 08/2018   GERD (gastroesophageal reflux disease)  no meds   History of COVID-19 04/05/2020   Migraine    migraines due to pinched nerve   Pinched nerve in neck    Scoliosis     MEDICATIONS: Current Outpatient Medications on File Prior to Visit  Medication Sig  Dispense Refill   predniSONE (DELTASONE) 20 MG tablet Take 2 tablets (40 mg total) by mouth daily. 10 tablet 0   QUEtiapine (SEROQUEL) 100 MG tablet Take 1 tablet (100 mg total) by mouth at bedtime. 30 tablet 6   [DISCONTINUED] famotidine (PEPCID) 40 MG tablet Take 1 tablet (40 mg total) by mouth daily. 90 tablet 1   Current Facility-Administered Medications on File Prior to Visit  Medication Dose Route Frequency Provider Last Rate Last Admin   triamcinolone acetonide (KENALOG) 10 MG/ML injection 20 mg  20 mg Other Once Sheffield, Kelli R, PA-C        ALLERGIES: Allergies  Allergen Reactions   Cyclobenzaprine Hives and Nausea Only   Depakote [Divalproex Sodium] Other (See Comments)    "made him crazy"   Ritalin [Methylphenidate Hcl] Other (See Comments)    Increased hyper activeness    Naproxen Nausea Only    Acid reflux   Other Other (See Comments)    Staples-pt states skin became reddened and he tasted metal (08-2018)   Tramadol Other (See Comments)    Acid reflux    FAMILY HISTORY: Family History  Problem Relation Age of Onset   Diabetes Mother    Kidney cancer Mother    Diabetes Maternal Grandmother    Cancer Maternal Grandmother        unknown type. doing chemo   Cancer Maternal Grandfather    Diabetes Maternal Aunt    Stomach cancer Neg Hx    Pancreatic cancer Neg Hx    Esophageal cancer Neg Hx    Liver disease Neg Hx    Colon cancer Neg Hx    Rectal cancer Neg Hx       Objective:  *** General: No acute distress.  Patient appears ***-groomed.   Head:  Normocephalic/atraumatic Eyes:  Fundi examined but not visualized Neck: supple, no paraspinal tenderness, full range of motion Heart:  Regular rate and rhythm Lungs:  Clear to auscultation bilaterally Back: No paraspinal tenderness Neurological Exam: alert and oriented to person, place, and time.  Speech fluent and not dysarthric, language intact.  CN II-XII intact. Bulk and tone normal, muscle strength 5/5  throughout.  Sensation to light touch intact.  Deep tendon reflexes 2+ throughout, toes downgoing.  Finger to nose testing intact.  Gait normal, Romberg negative.   Metta Clines, DO  CC: ***

## 2021-07-18 ENCOUNTER — Encounter: Payer: Self-pay | Admitting: Neurology

## 2021-07-18 ENCOUNTER — Ambulatory Visit: Payer: Self-pay | Admitting: Neurology

## 2021-07-18 ENCOUNTER — Ambulatory Visit: Payer: Self-pay | Admitting: Orthopaedic Surgery

## 2021-07-18 DIAGNOSIS — Z029 Encounter for administrative examinations, unspecified: Secondary | ICD-10-CM

## 2021-07-19 ENCOUNTER — Ambulatory Visit: Payer: Self-pay | Admitting: Orthopaedic Surgery

## 2021-07-26 ENCOUNTER — Ambulatory Visit: Payer: 59 | Admitting: Physician Assistant

## 2021-07-27 ENCOUNTER — Telehealth: Payer: Self-pay | Admitting: Family Medicine

## 2021-07-27 DIAGNOSIS — Z79891 Long term (current) use of opiate analgesic: Secondary | ICD-10-CM | POA: Diagnosis not present

## 2021-07-27 DIAGNOSIS — M25512 Pain in left shoulder: Secondary | ICD-10-CM | POA: Diagnosis not present

## 2021-07-27 NOTE — Telephone Encounter (Signed)
Patient has been scheduled for a virtual appointment. ?

## 2021-07-27 NOTE — Telephone Encounter (Signed)
Copied from [place] [phone]. Topic: Referral - Question ?>> Jul 27, 2021 10:42 AM Antonieta Iba C wrote: ?Reason for CRM: pt called in to request a different pain management provider. Please assist pt further.  ? ?[phone] ?

## 2021-07-30 ENCOUNTER — Encounter: Payer: Self-pay | Admitting: Family Medicine

## 2021-07-30 ENCOUNTER — Ambulatory Visit: Payer: 59 | Attending: Family Medicine | Admitting: Family Medicine

## 2021-07-30 DIAGNOSIS — G8929 Other chronic pain: Secondary | ICD-10-CM | POA: Diagnosis not present

## 2021-07-30 DIAGNOSIS — M25512 Pain in left shoulder: Secondary | ICD-10-CM | POA: Diagnosis not present

## 2021-07-30 NOTE — Progress Notes (Signed)
? ?Virtual Visit via Telephone Note ? ?I connected with Jimmy [name], on 07/30/2021 at 8:13 AM by telephone and verified that I am speaking with the correct person using two identifiers. ?  ?Consent: ?I discussed the limitations, risks, security and privacy concerns of performing an evaluation and management service by telephone and the availability of in person appointments. I also discussed with the patient that there may be a patient responsible charge related to this service. The patient expressed understanding and agreed to proceed. ? ? ?Location of Patient: ?Home ? ?Location of Provider: ?Clinic ? ? ?Persons participating in Telemedicine visit: ?RENLEY BANWART ?Dr. Margarita Rana ? ? ? ? ?History of Present Illness: ?Jimmy [name] is a 31 y.o. year old male with history of migraines, bipolar disorder, insomnia, chronic left shoulder pain (status post left shoulder arthroscopy, biceps tenodesis tenolysis in 2021 and 2022) ?  ?Requests referral to Pain management due to L shoulder pain which she rates as a 9/10 as this has been uncontrolled despite using his gabapentin, muscle relaxant, Tylenol. ?Past Medical History:  ?Diagnosis Date  ? ADHD (attention deficit hyperactivity disorder)   ? Anxiety   ? Arthritis   ? Cancer [facility])   ? skin cancer  ? Chest pain 08/2018  ? GERD (gastroesophageal reflux disease)   ? no meds  ? History of COVID-19 04/05/2020  ? Migraine   ? migraines due to pinched nerve  ? Pinched nerve in neck   ? Scoliosis   ? ?Allergies  ?Allergen Reactions  ? Cyclobenzaprine Hives and Nausea Only  ? Depakote [Divalproex Sodium] Other (See Comments)  ?  "made him crazy"  ? Ritalin [Methylphenidate Hcl] Other (See Comments)  ?  Increased hyper activeness   ? Naproxen Nausea Only  ?  Acid reflux  ? Other Other (See Comments)  ?  [facility]-pt states skin became reddened and he tasted metal (08-2018)  ? Tramadol Other (See Comments)  ?  Acid reflux  ? ? ?Current Outpatient Medications on File Prior to  Visit  ?Medication Sig Dispense Refill  ? predniSONE (DELTASONE) 20 MG tablet Take 2 tablets (40 mg total) by mouth daily. 10 tablet 0  ? QUEtiapine (SEROQUEL) 100 MG tablet Take 1 tablet (100 mg total) by mouth at bedtime. 30 tablet 6  ? [DISCONTINUED] famotidine (PEPCID) 40 MG tablet Take 1 tablet (40 mg total) by mouth daily. 90 tablet 1  ? ?Current Facility-Administered Medications on File Prior to Visit  ?Medication Dose Route Frequency Provider Last Rate Last Admin  ? triamcinolone acetonide (KENALOG) 10 MG/ML injection 20 mg  20 mg Other Once Sheffield, Kelli R, PA-C      ? ? ?ROS: ?See HPI ? ?Observations/Objective: ?Awake, alert, oriented x3 ?Not in acute distress ?Normal mood ? ? ? ?  Latest Ref Rng & Units 04/21/2020  ? 10:51 AM 03/22/2020  ?  4:12 PM 12/28/2019  ?  9:44 AM  ?CMP  ?Glucose 65 - 99 mg/dL 96   86   80    ?BUN 7 - 25 mg/dL '18   13   13    '$ ?Creatinine 0.60 - 1.35 mg/dL 0.82   0.81   0.73    ?Sodium 135 - 146 mmol/L 142   137   138    ?Potassium 3.5 - 5.3 mmol/L 4.3   4.4   4.0    ?Chloride 98 - 110 mmol/L 110   105   108    ?CO2 20 - 32 mmol/L  $'23   25   24    'c$ ?Calcium 8.6 - 10.3 mg/dL 9.4   9.4   9.2    ?Total Protein 6.1 - 8.1 g/dL 6.7   7.0   7.3    ?Total Bilirubin 0.2 - 1.2 mg/dL 0.4   0.3   0.4    ?Alkaline Phos 39 - 117 U/L   69    ?AST 10 - 40 U/L '15   16   13    '$ ?ALT 9 - 46 U/L 49   27   21    ? ? ?Lipid Panel  ?   ?Component Value Date/Time  ? CHOL 198 04/21/2020 1051  ? CHOL 194 02/04/2019 0838  ? TRIG 89 04/21/2020 1051  ? HDL 42 04/21/2020 1051  ? HDL 45 02/04/2019 0838  ? CHOLHDL 4.7 04/21/2020 1051  ? LDLCALC 137 (H) 04/21/2020 1051  ? LABVLDL 18 02/04/2019 0838  ? ? ?Lab Results  ?Component Value Date  ? HGBA1C 5.3 02/04/2019  ? ? ?Assessment and Plan: ?1. Other chronic pain ?Uncontrolled on gabapentin, muscle relaxants ?We will refer to pain clinic per request ?- Ambulatory referral to Pain Clinic ? ?2. Chronic left shoulder pain ?See #1 above ?- Ambulatory referral to Pain  Clinic ? ? ?Follow Up Instructions: ?Keep previously scheduled appointment ?  ?I discussed the assessment and treatment plan with the patient. The patient was provided an opportunity to ask questions and all were answered. The patient agreed with the plan and demonstrated an understanding of the instructions. ?  ?The patient was advised to call back or seek an in-person evaluation if the symptoms worsen or if the condition fails to improve as anticipated. ? ? ? ? ?I provided 7 minutes total of non-face-to-face time during this encounter. ? ? ?Charlott Rakes, MD, FAAFP. ?[place] ?[place], [name] ?[phone]   ?07/30/2021, 8:13 AM ? ?

## 2021-08-09 ENCOUNTER — Encounter: Payer: Self-pay | Admitting: Family Medicine

## 2021-08-09 ENCOUNTER — Telehealth: Payer: Self-pay | Admitting: Family Medicine

## 2021-08-09 NOTE — Telephone Encounter (Signed)
Copied from [place] [phone]. Topic: Referral - Status >> Aug 09, 2021  3:29 PM Yvette Rack wrote: Reason for CRM: Pt stated he has not heard from anyone regarding referral for pain management.

## 2021-08-27 ENCOUNTER — Encounter: Payer: Self-pay | Admitting: Family Medicine

## 2021-08-27 DIAGNOSIS — R404 Transient alteration of awareness: Secondary | ICD-10-CM | POA: Diagnosis not present

## 2021-08-27 DIAGNOSIS — G8929 Other chronic pain: Secondary | ICD-10-CM | POA: Diagnosis not present

## 2021-08-27 DIAGNOSIS — Z885 Allergy status to narcotic agent status: Secondary | ICD-10-CM | POA: Diagnosis not present

## 2021-08-27 DIAGNOSIS — I4510 Unspecified right bundle-branch block: Secondary | ICD-10-CM | POA: Diagnosis not present

## 2021-08-27 DIAGNOSIS — T50994A Poisoning by other drugs, medicaments and biological substances, undetermined, initial encounter: Secondary | ICD-10-CM | POA: Diagnosis not present

## 2021-08-27 DIAGNOSIS — R000 Tachycardia, unspecified: Secondary | ICD-10-CM | POA: Diagnosis not present

## 2021-08-27 DIAGNOSIS — R61 Generalized hyperhidrosis: Secondary | ICD-10-CM | POA: Diagnosis not present

## 2021-08-27 DIAGNOSIS — Z888 Allergy status to other drugs, medicaments and biological substances status: Secondary | ICD-10-CM | POA: Diagnosis not present

## 2021-08-27 DIAGNOSIS — R69 Illness, unspecified: Secondary | ICD-10-CM | POA: Diagnosis not present

## 2021-08-28 ENCOUNTER — Emergency Department (HOSPITAL_COMMUNITY): Payer: 59

## 2021-08-28 ENCOUNTER — Emergency Department (HOSPITAL_COMMUNITY)
Admission: EM | Admit: 2021-08-28 | Discharge: 2021-08-28 | Disposition: A | Discharge status: Home / Self Care | Payer: 59 | Attending: Emergency Medicine | Admitting: Emergency Medicine

## 2021-08-28 ENCOUNTER — Encounter: Payer: Self-pay | Admitting: Family Medicine

## 2021-08-28 ENCOUNTER — Encounter: Payer: Self-pay | Admitting: Emergency Medicine

## 2021-08-28 ENCOUNTER — Encounter (HOSPITAL_COMMUNITY): Payer: Self-pay | Admitting: *Deleted

## 2021-08-28 ENCOUNTER — Other Ambulatory Visit: Payer: Self-pay

## 2021-08-28 ENCOUNTER — Telehealth: Payer: Self-pay

## 2021-08-28 ENCOUNTER — Ambulatory Visit
Admission: EM | Admit: 2021-08-28 | Discharge: 2021-08-28 | Disposition: A | Discharge status: Nursing Home (Includes ICF) | Payer: Self-pay

## 2021-08-28 DIAGNOSIS — R569 Unspecified convulsions: Secondary | ICD-10-CM | POA: Insufficient documentation

## 2021-08-28 DIAGNOSIS — R55 Syncope and collapse: Secondary | ICD-10-CM | POA: Diagnosis not present

## 2021-08-28 DIAGNOSIS — H538 Other visual disturbances: Secondary | ICD-10-CM | POA: Diagnosis not present

## 2021-08-28 LAB — COMPREHENSIVE METABOLIC PANEL
ALT: 19 U/L (ref 0–44)
AST: 18 U/L (ref 15–41)
Albumin: 4.1 g/dL (ref 3.5–5.0)
Alkaline Phosphatase: 73 U/L (ref 38–126)
Anion gap: 4 — ABNORMAL LOW (ref 5–15)
BUN: 5 mg/dL — ABNORMAL LOW (ref 6–20)
CO2: 26 mmol/L (ref 22–32)
Calcium: 8.8 mg/dL — ABNORMAL LOW (ref 8.9–10.3)
Chloride: 106 mmol/L (ref 98–111)
Creatinine, Ser: 0.58 mg/dL — ABNORMAL LOW (ref 0.61–1.24)
GFR, Estimated: >60 mL/min (ref >60)
Glucose, Bld: 135 mg/dL — ABNORMAL HIGH (ref 70–99)
Potassium: 3.6 mmol/L (ref 3.5–5.1)
Sodium: 136 mmol/L (ref 135–145)
Total Bilirubin: 0.4 mg/dL (ref 0.3–1.2)
Total Protein: 6.9 g/dL (ref 6.5–8.1)

## 2021-08-28 LAB — CBC WITH DIFFERENTIAL/PLATELET
Abs Immature Granulocytes: 0.01 10*3/uL (ref 0.00–0.07)
Basophils Absolute: 0 10*3/uL (ref 0.0–0.1)
Basophils Relative: 0 %
Eosinophils Absolute: 0.4 10*3/uL (ref 0.0–0.5)
Eosinophils Relative: 5 %
HCT: 39.6 % (ref 39.0–52.0)
Hemoglobin: 13.1 g/dL (ref 13.0–17.0)
Immature Granulocytes: 0 %
Lymphocytes Relative: 29 %
Lymphs Abs: 2.4 10*3/uL (ref 0.7–4.0)
MCH: 30.4 pg (ref 26.0–34.0)
MCHC: 33.1 g/dL (ref 30.0–36.0)
MCV: 91.9 fL (ref 80.0–100.0)
Monocytes Absolute: 0.8 10*3/uL (ref 0.1–1.0)
Monocytes Relative: 10 %
Neutro Abs: 4.7 10*3/uL (ref 1.7–7.7)
Neutrophils Relative %: 56 %
Platelets: 271 10*3/uL (ref 150–400)
RBC: 4.31 MIL/uL (ref 4.22–5.81)
RDW: 12.7 % (ref 11.5–15.5)
WBC: 8.3 10*3/uL (ref 4.0–10.5)
nRBC: 0 % (ref 0.0–0.2)

## 2021-08-28 LAB — RAPID URINE DRUG SCREEN, HOSP PERFORMED
Amphetamines: NOT DETECTED
Barbiturates: NOT DETECTED
Benzodiazepines: NOT DETECTED
Cocaine: NOT DETECTED
Opiates: NOT DETECTED
Tetrahydrocannabinol: NOT DETECTED

## 2021-08-28 LAB — MAGNESIUM: Magnesium: 2.2 mg/dL (ref 1.7–2.4)

## 2021-08-28 LAB — CBG MONITORING, ED: Glucose-Capillary: 121 mg/dL — ABNORMAL HIGH (ref 70–99)

## 2021-08-28 IMAGING — CT CT HEAD W/O CM
3 of 4 series · 15 of 47 positions shown, 18 images · non-contrast
Comparison: [DATE]

CLINICAL DATA: Seizures



[Series 3: head w o · axial · 0.44mm/px · z∈[+65,+190]mm · 9 of 31 slices shown, 12 images]
[im 3/31  brain]
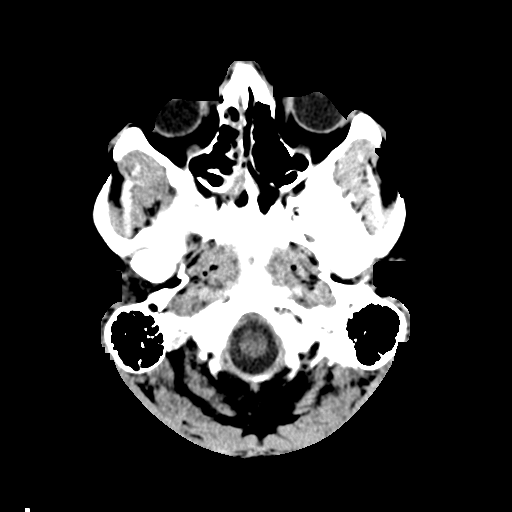
[im 3/31  bone]
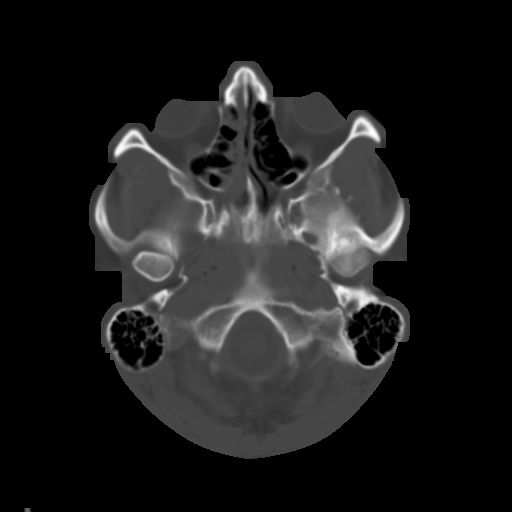
[im 7/31  brain]
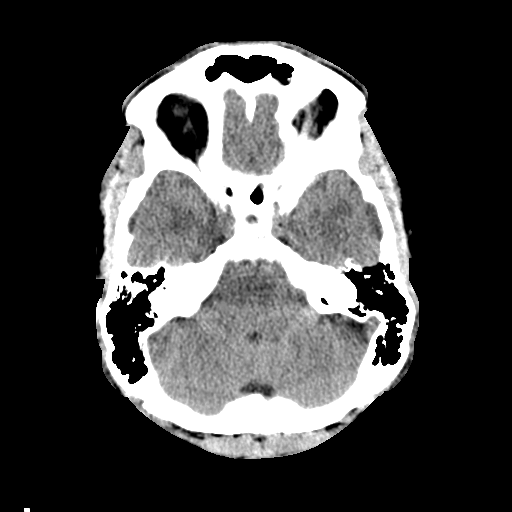
[im 9/31  brain]
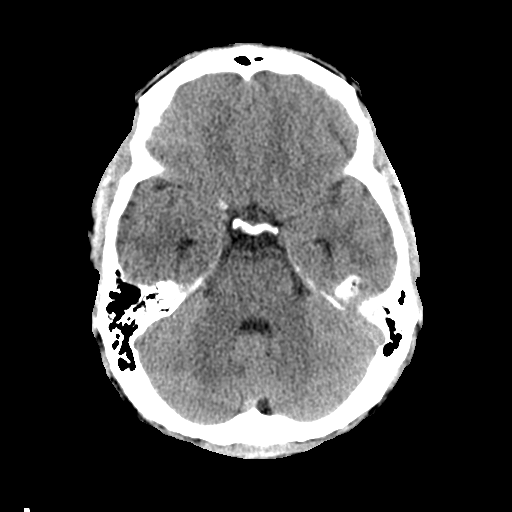
[im 13/31  brain]
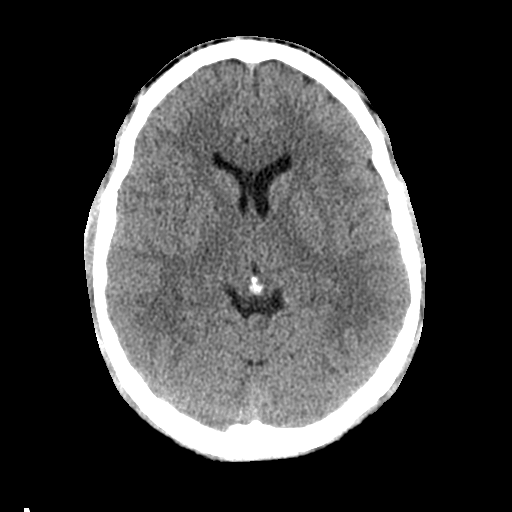
[im 16/31  brain]
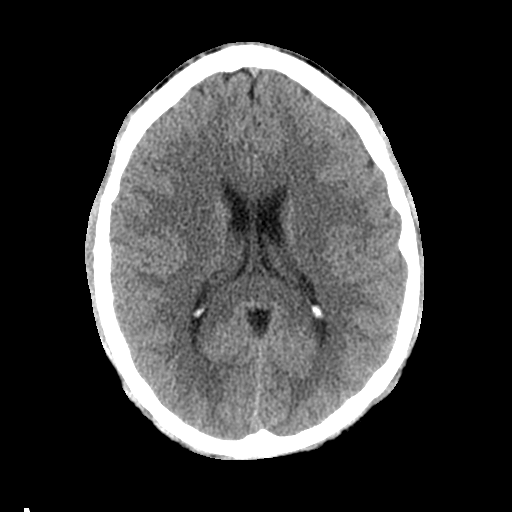
[im 16/31  bone]
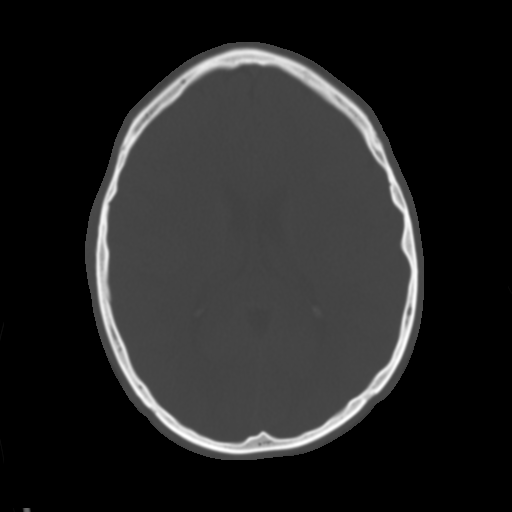
[im 18/31  brain]
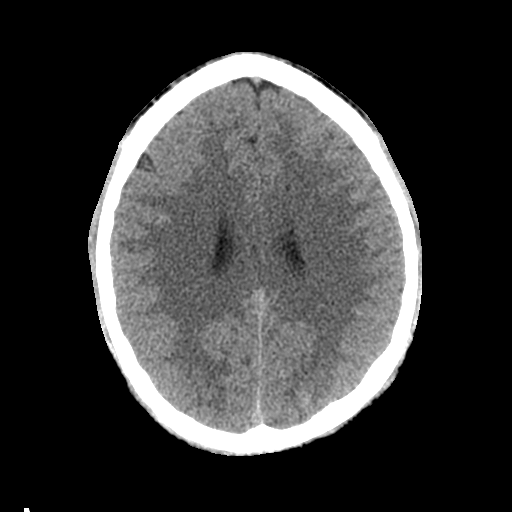
[im 22/31  brain]
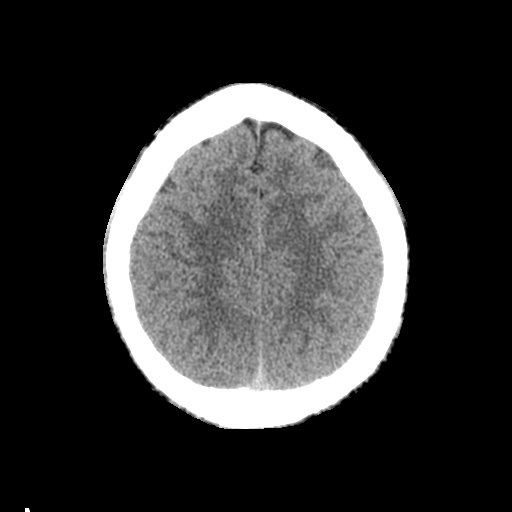
[im 24/31  brain]
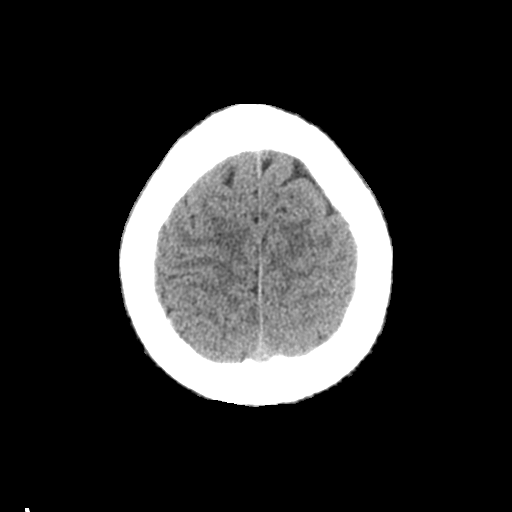
[im 28/31  brain]
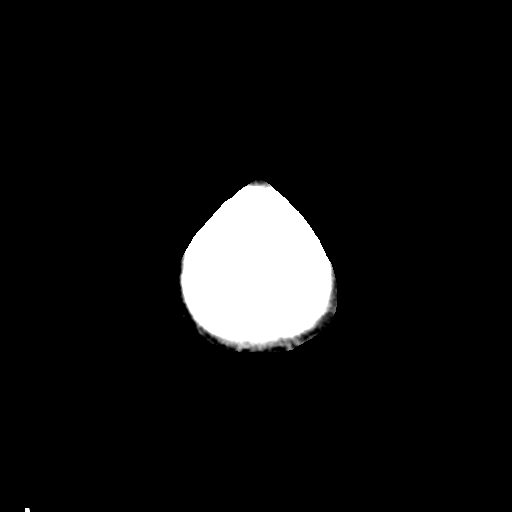
[im 28/31  bone]
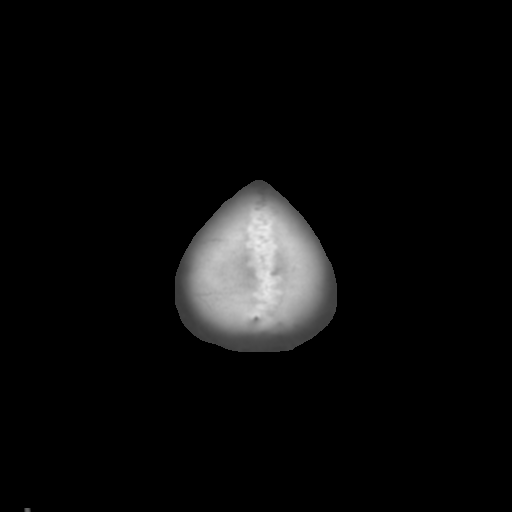

[Series 5: coronal soft · coronal · 0.34mm/px · 3 of 74 slices shown]
[im 25/74  brain]
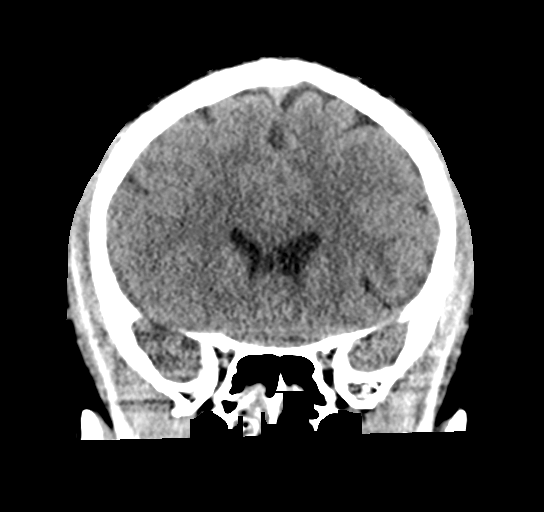
[im 33/74  brain]
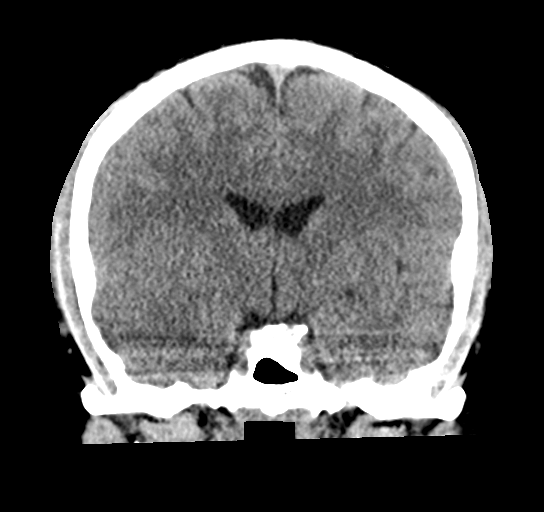
[im 41/74  brain]
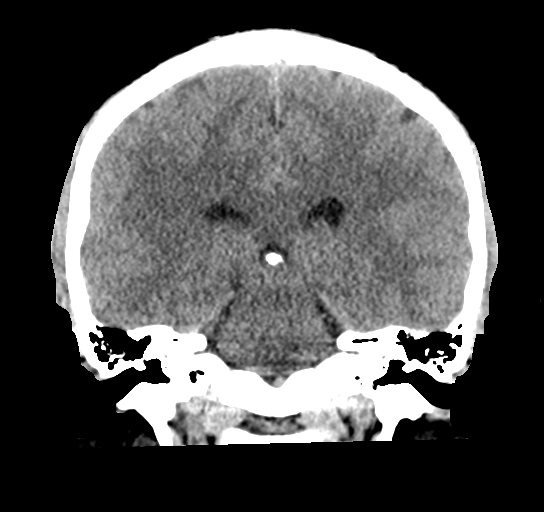

[Series 6: sagittal soft · sagittal · 0.32mm/px · 3 of 60 slices shown]
[im 20/60  brain]
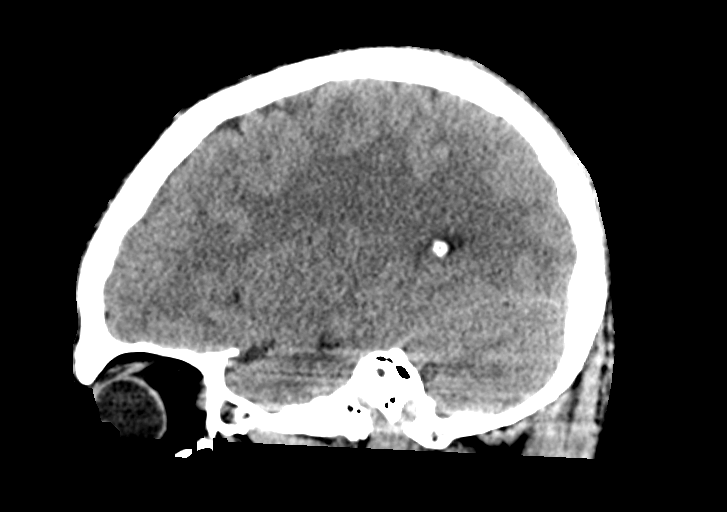
[im 30/60  brain]
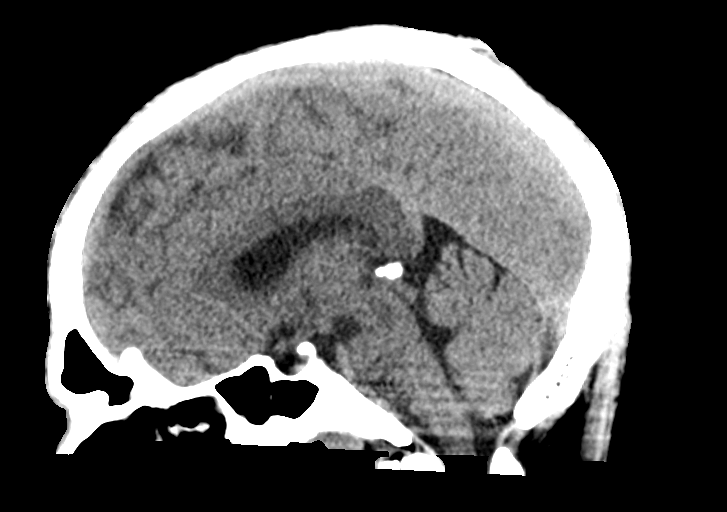
[im 40/60  brain]
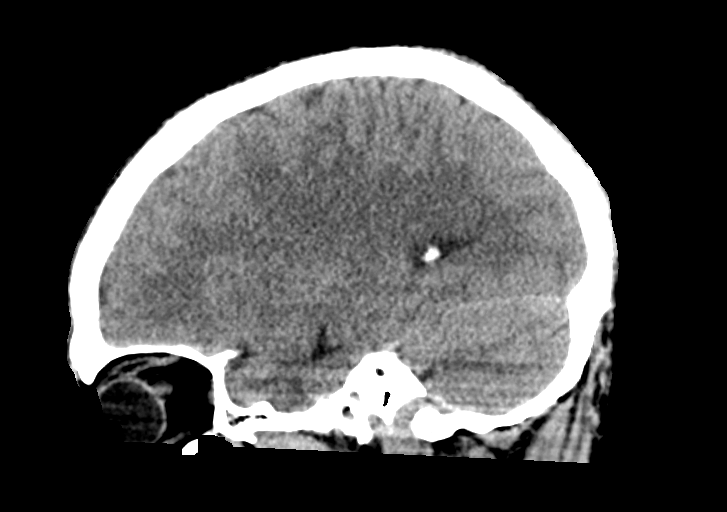

[15 of 47 positions shown; findings below may reference images not displayed]

FINDINGS: Brain: No acute intracranial findings are seen. There are no signs
of bleeding within the cranium. Ventricles are not dilated. There is
no focal edema or mass effect.

Vascular: Unremarkable.

Skull: Unremarkable.

Sinuses/Orbits: There is mucosal thickening in the ethmoid sphenoid
and maxillary sinuses.

Other: None.
IMPRESSION: No acute intracranial findings are seen in noncontrast CT brain.

Chronic sinusitis.

## 2021-08-28 NOTE — ED Notes (Addendum)
Went into pt's room with d/c instructions/paperwork. They (pt and girlfriend) immediately started asking me questions before I even started going over instructions. They said that they want to speak to the Dr, Quintella Baton has talked to them about test results. Said that they are not leaving until they get to the bottom of what is going on. Pt states needs to speak with the Dr.  Ree Kida the provider that the pt has some questions, wants to see him. Pt got up to the bathroom; Dr came in while pt gone, but the girlfriend started escalating- getting loud with Dr Reather Converse, saying that they have been to the hospital so many times, and there are no answers. Then pt came in from the bathroom and he started escalating- getting loud/irrational, saying that the Dr did not do his job, he wants answers. Dr Reather Converse tried to tell pt that the instructions state that he needs to follow up with Neurology for an EEG, but we can do a Neuro consult now (tele) if he wants. Pt and girlfriend were no longer listening to reason at this time. Saying that they were just gonna leave now, give them the papers and they are leaving! Yelling to take IV out and give him his damn papers. Saying that he will go to [facility] and they will figure out what is going on and that when that happens, he will "have his (Dr's) medical license"  They never even let anyone go over d/c instructions with them. They would not listen.  RN took out IV, handed them the papers and the pt and girlfriend left- still speaking very rudely and loud.
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
## 2021-08-28 NOTE — ED Triage Notes (Signed)
Pt states he is hurting in his chest, blurred vision and dizzy  Pt states he is also experiencing a headache
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
## 2021-08-28 NOTE — Discharge Instructions (Addendum)
Follow-up with primary doctor and neurologist outpatient. No driving or operating machinery until you are cleared by neurology. Return for weakness in one-sided body, chest pain, difficulty breathing, slurred speech or new concerns.

## 2021-08-28 NOTE — ED Notes (Signed)
Patient is being discharged from the Urgent Care and sent to the Emergency Department via POV. Per NP, patient is in need of higher level of care due to blurred vision/chest pain/headache post syncope yesterday. Patient is aware and verbalizes understanding of plan of care.  Vitals:   08/28/21 0934  BP: (!) 147/97  Pulse: 80  Resp: 20  Temp: 98.1 F (36.7 C)  SpO2: 94%
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
## 2021-08-29 ENCOUNTER — Telehealth: Payer: Self-pay
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
## 2021-08-29 NOTE — Progress Notes (Signed)
Cardiology Office Note:   Date:  08/30/2021  NAME:  Jimmy [name]    MRN: [id number] DOB:  01-25-1991   PCP:  Charlott Rakes, MD  Cardiologist:  None  Electrophysiologist:  None   Referring MD: Barton Fanny, NP   Chief Complaint  Patient presents with   Loss of Consciousness        History of Present Illness:   Jimmy [name] is a 31 y.o. male with a hx of anxiety who is being seen today for the evaluation of syncope at the request of Charlott Rakes, MD. he presents with his fiance.  He apparently was driving on Monday afternoon and had a syncopal episode.  He reports feeling nauseated and dizzy.  He was able to pull off to the median and then get into a parking lot.  His fiance reports seizure-like activity for several minutes.  He reports feeling foggy after and did not feel quite back to normal.  He reports feeling poorly since that time.  He reports no chest pain or trouble breathing.  He was seen in the ED in the emergency room but they did not do much for him.  He then checked out and went to [facility].  Blood work was unremarkable except for elevated blood sugar.  He reports blood sugars have been elevated at home.  We discussed checking an A1c.  He reports low activity.  His fiance also reports poor sleep patterns.  She reports he may stop breathing at his sleep.  I do wonder if he has sleep apnea.  He mentions a history of seizure disorder in the past.  He is not on seizure medication.  He has plans to see neurology on Monday.  He reports no strong family history of heart disease.  No sudden history of cardiac death.  His EKG is normal.  His cardiovascular exam is normal.  UDS was negative in the hospital.  He reports no drug use.  He does smoke.  No alcohol use either.  He works in Architect and operates Midwife.  I did inform him that he cannot drive for 6 months given his history of seizure disorder and passing out episode.  He also probably should not  return to work given that he operates heavy machinery.  We will give him a note.  He will need to be evaluated by [place] with an echocardiogram as well as neurology before he can return to work or drive.  He reports he did have very little to eat or drink that day.  It is a bit unusual for him to pass out while driving.  Symptoms to me sound more like seizure.  Past Medical History: Past Medical History:  Diagnosis Date   ADHD (attention deficit hyperactivity disorder)    Anxiety    Arthritis    Cancer ([place])    skin cancer   Chest pain 08/2018   GERD (gastroesophageal reflux disease)    no meds   History of COVID-19 04/05/2020   Migraine    migraines due to pinched nerve   Pinched nerve in neck    Scoliosis    Seizure [facility])     Past Surgical History: Past Surgical History:  Procedure Laterality Date   BICEPT TENODESIS Left 06/21/2020   Procedure: BICEPS TENODESIS TENOLYSIS;  Surgeon: Leandrew Koyanagi, MD;  Location: [place];  Service: Orthopedics;  Laterality: Left;   ESOPHAGUS SURGERY     as a baby  SHOULDER ARTHROSCOPY WITH BICEPS TENDON REPAIR Left 03/01/2020   Procedure: LEFT SHOULDER ARTHROSCOPY WITH BICEPS TENODESIS;  Surgeon: Leandrew Koyanagi, MD;  Location: [place];  Service: Orthopedics;  Laterality: Left;   SHOULDER ARTHROSCOPY WITH SUBACROMIAL DECOMPRESSION Right 09/01/2019   Procedure: RIGHT SHOULDER ARTHROSCOPY WITH EXTENSIVE DEBRIDEMENT AND SUBACROMIAL DECOMPRESSION;  Surgeon: Leandrew Koyanagi, MD;  Location: [place];  Service: Orthopedics;  Laterality: Right;   VENTRAL HERNIA REPAIR N/A 08/31/2018   Procedure: HERNIA REPAIR VENTRAL ADULT;  Surgeon: Olean Ree, MD;  Location: [facility];  Service: General;  Laterality: N/A;    Current Medications: Current Meds  Medication Sig   AIMOVIG 140 MG/ML SOAJ Inject into the skin every 30 (thirty) days.   DULoxetine (CYMBALTA) 60 MG capsule Take 60 mg by mouth daily in the afternoon.    gabapentin (NEURONTIN) 400 MG capsule Take 400 mg by mouth 2 (two) times daily.   methocarbamol (ROBAXIN) 500 MG tablet Take 500 mg by mouth in the morning, at noon, in the evening, and at bedtime.   QUEtiapine (SEROQUEL) 100 MG tablet Take 1 tablet (100 mg total) by mouth at bedtime.   Current Facility-Administered Medications for the 08/30/21 encounter (Office Visit) with Geralynn Rile, MD  Medication   triamcinolone acetonide (KENALOG) 10 MG/ML injection 20 mg     Allergies:    Cyclobenzaprine, Depakote [divalproex sodium], Ritalin [methylphenidate hcl], Naproxen, Other, and Tramadol   Social History: Social History   Socioeconomic History   Marital status: Married    Spouse name: Not on file   Number of children: 3   Years of education: Not on file   Highest education level: Not on file  Occupational History   Occupation: Architect work  Tobacco Use   Smoking status: Every Day    Packs/day: 0.75    Years: 15.00    Total pack years: 11.25    Types: Cigarettes    Passive exposure: Never   Smokeless tobacco: Former  Scientific laboratory technician Use: Never used  Substance and Sexual Activity   Alcohol use: Not Currently   Drug use: Not Currently   Sexual activity: Yes  Other Topics Concern   Not on file  Social History Narrative   Right handed   One story home    Drinks caffeine    Social Determinants of Health   Financial Resource Strain: Not on file  Food Insecurity: Not on file  Transportation Needs: Not on file  Physical Activity: Not on file  Stress: Not on file  Social Connections: Not on file     Family History: The patient's family history includes Cancer in his maternal grandfather and maternal grandmother; Diabetes in his maternal aunt, maternal grandmother, and mother; Hypertension in his mother; Kidney cancer in his mother. There is no history of Stomach cancer, Pancreatic cancer, Esophageal cancer, Liver disease, Colon cancer, or Rectal  cancer.  ROS:   All other ROS reviewed and negative. Pertinent positives noted in the HPI.     EKGs/Labs/Other Studies Reviewed:   The following studies were personally reviewed by me today:  EKG:  EKG is ordered today.  The ekg ordered today demonstrates normal sinus rhythm heart rate 70, no acute ischemic changes or evidence of infarction, and was personally reviewed by me.   Recent Labs: 08/28/2021: ALT 19; BUN 5; Creatinine, Ser 0.58; Hemoglobin 13.1; Magnesium 2.2; Platelets 271; Potassium 3.6; Sodium 136   Recent Lipid Panel    Component Value Date/Time  CHOL 198 04/21/2020 1051   CHOL 194 02/04/2019 0838   TRIG 89 04/21/2020 1051   HDL 42 04/21/2020 1051   HDL 45 02/04/2019 0838   CHOLHDL 4.7 04/21/2020 1051   LDLCALC 137 (H) 04/21/2020 1051    Physical Exam:   VS:  BP 100/70 (BP Location: Left Arm, Patient Position: Sitting, Cuff Size: Large)   Pulse 70   Ht '5\' 4"'$  (1.626 m)   Wt 170 lb 9.6 oz (77.4 kg)   SpO2 96%   BMI 29.28 kg/m    Wt Readings from Last 3 Encounters:  08/30/21 170 lb 9.6 oz (77.4 kg)  08/28/21 171 lb (77.6 kg)  08/28/21 170 lb 3.2 oz (77.2 kg)    General: Well nourished, well developed, in no acute distress Head: Atraumatic, normal size  Eyes: PEERLA, EOMI  Neck: Supple, no JVD Endocrine: No thryomegaly Cardiac: Normal S1, S2; RRR; no murmurs, rubs, or gallops Lungs: Clear to auscultation bilaterally, no wheezing, rhonchi or rales  Abd: Soft, nontender, no hepatomegaly  Ext: No edema, pulses 2+ Musculoskeletal: No deformities, BUE and BLE strength normal and equal Skin: Warm and dry, no rashes   Neuro: Alert and oriented to person, place, time, and situation, CNII-XII grossly intact, no focal deficits  Psych: Normal mood and affect   ASSESSMENT:   Jimmy [name] is a 31 y.o. male who presents for the following: 1. Syncope and collapse     PLAN:   1. Syncope and collapse -Syncopal episode while driving.  UDS negative.  No drug use  reported.  CT head negative in the hospital.  His EKG is normal.  He really had no prodrome of chest pain or trouble breathing or rapid heartbeat to suggest a cardiac etiology.  He reports history of seizure disorder as a child.  He does report some snoring at night and is fiance reports he may stop breathing.  I suspect sleep apnea could be an issue.  He has plans to see neurology on Monday.  I would like to check a TSH and A1c.  He mentions high blood sugars.  He may have diabetes and this could explain the trigger for the episode.  All of his glucose values in [place] and at [facility] were borderline.  I would also like for him to get an echocardiogram.  Given that his EKG is normal and suspect his echo will be normal I have a low suspicion for cardiac etiology.  He has normal intervals.  Given the history of seizure disorder is a youth I believe the working diagnosis is seizure.  I did inform that he cannot drive for 6 months per [facility].  He will need further evaluation by neurology which will take place on Monday.  I also believe it is not a good idea for him to return to work.  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  Given this episode I do not want to expose him or any of his coworkers to any unnecessary trauma.  We will take him out of work until cardiology work-up is complete and then have neurology further evaluate him on Monday.  He will see me back as needed.  Again symptoms are more consistent with seizure in my opinion.  Disposition: Return if symptoms worsen or fail to improve.  Medication Adjustments/Labs and Tests Ordered: Current medicines are reviewed at length with the patient today.  Concerns regarding medicines are outlined above.  Orders Placed This Encounter  Procedures  TSH   Hemoglobin A1c   EKG 12-Lead   ECHOCARDIOGRAM COMPLETE   No orders of the defined types were placed in this encounter.   Patient Instructions  Medication  Instructions:  The current medical regimen is effective;  continue present plan and medications.  *If you need a refill on your cardiac medications before your next appointment, please call your pharmacy*   Lab Work: TSH, A1C today   If you have labs (blood work) drawn today and your tests are completely normal, you will receive your results only by: [place] (if you have MyChart) OR A paper copy in the mail If you have any lab test that is abnormal or we need to change your treatment, we will call you to review the results.   Testing/Procedures: Echocardiogram - Your physician has requested that you have an echocardiogram. Echocardiography is a painless test that uses sound waves to create images of your heart. It provides your doctor with information about the size and shape of your heart and how well your heart's chambers and valves are working. This procedure takes approximately one hour. There are no restrictions for this procedure.    Follow-Up: At [facility], you and your health needs are our priority.  As part of our continuing mission to provide you with exceptional heart care, we have created designated Provider Care Teams.  These Care Teams include your primary Cardiologist (physician) and Advanced Practice Providers (APPs -  Physician Assistants and Nurse Practitioners) who all work together to provide you with the care you need, when you need it.  We recommend signing up for the patient portal called "MyChart".  Sign up information is provided on this After Visit Summary.  MyChart is used to connect with patients for Virtual Visits (Telemedicine).  Patients are able to view lab/test results, encounter notes, upcoming appointments, etc.  Non-urgent messages can be sent to your provider as well.   To learn more about what you can do with MyChart, go to [email or web address].    Your next appointment:   As needed  The format for your next appointment:   In  Person  Provider:   Eleonore Chiquito, MD              Signed, Addison Naegeli. Audie Box, MD, [facility]  [address] [place], [place] [address] [phone]  08/30/2021 9:53 AM

## 2021-08-30 ENCOUNTER — Telehealth: Payer: Self-pay | Admitting: Family Medicine

## 2021-08-30 ENCOUNTER — Ambulatory Visit (INDEPENDENT_AMBULATORY_CARE_PROVIDER_SITE_OTHER): Payer: 59 | Admitting: Cardiovascular Disease

## 2021-08-30 ENCOUNTER — Encounter: Payer: Self-pay | Admitting: Cardiovascular Disease

## 2021-08-30 VITALS — BP 100/70 | HR 70 | Ht 64.0 in | Wt 170.6 lb

## 2021-08-30 DIAGNOSIS — R55 Syncope and collapse: Secondary | ICD-10-CM | POA: Diagnosis not present

## 2021-08-30 LAB — HEMOGLOBIN A1C
Est. average glucose Bld gHb Est-mCnc: 108 mg/dL
Hgb A1c MFr Bld: 5.4 % (ref 4.8–5.6)

## 2021-08-30 LAB — TSH: TSH: 0.449 u[IU]/mL — ABNORMAL LOW (ref 0.450–4.500)

## 2021-08-30 NOTE — Patient Instructions (Addendum)
Medication Instructions:  The current medical regimen is effective;  continue present plan and medications.  *If you need a refill on your cardiac medications before your next appointment, please call your pharmacy*   Lab Work: TSH, A1C today   If you have labs (blood work) drawn today and your tests are completely normal, you will receive your results only by: [place] (if you have MyChart) OR A paper copy in the mail If you have any lab test that is abnormal or we need to change your treatment, we will call you to review the results.   Testing/Procedures: Echocardiogram - Your physician has requested that you have an echocardiogram. Echocardiography is a painless test that uses sound waves to create images of your heart. It provides your doctor with information about the size and shape of your heart and how well your heart's chambers and valves are working. This procedure takes approximately one hour. There are no restrictions for this procedure.    Follow-Up: At [facility], you and your health needs are our priority.  As part of our continuing mission to provide you with exceptional heart care, we have created designated Provider Care Teams.  These Care Teams include your primary Cardiologist (physician) and Advanced Practice Providers (APPs -  Physician Assistants and Nurse Practitioners) who all work together to provide you with the care you need, when you need it.  We recommend signing up for the patient portal called "MyChart".  Sign up information is provided on this After Visit Summary.  MyChart is used to connect with patients for Virtual Visits (Telemedicine).  Patients are able to view lab/test results, encounter notes, upcoming appointments, etc.  Non-urgent messages can be sent to your provider as well.   To learn more about what you can do with MyChart, go to [email or web address].    Your next appointment:   As needed  The format for your next appointment:    In Person  Provider:   Eleonore Chiquito, MD

## 2021-08-30 NOTE — Telephone Encounter (Signed)
Copied from [place] [phone]. Topic: General - Other >> Aug 29, 2021 12:30 PM Everette C wrote: Reason for CRM: Winnie with [facility] has additional questions and concerns regarding the patient's financial status as well as their previous requests for referrals for neurology and cardiology   Please contact Winnie further when possible   Hulan Amato would also like for the patient to be sen for financial counseling to review their orange and blue cards if they have not already been seen   Please contact further when possible

## 2021-08-31 ENCOUNTER — Encounter: Payer: Self-pay | Admitting: Cardiovascular Disease

## 2021-08-31 ENCOUNTER — Other Ambulatory Visit: Payer: Self-pay

## 2021-08-31 DIAGNOSIS — R7989 Other specified abnormal findings of blood chemistry: Secondary | ICD-10-CM

## 2021-08-31 DIAGNOSIS — R55 Syncope and collapse: Secondary | ICD-10-CM

## 2021-09-03 DIAGNOSIS — R5383 Other fatigue: Secondary | ICD-10-CM | POA: Diagnosis not present

## 2021-09-03 DIAGNOSIS — M79602 Pain in left arm: Secondary | ICD-10-CM | POA: Diagnosis not present

## 2021-09-03 DIAGNOSIS — M25511 Pain in right shoulder: Secondary | ICD-10-CM | POA: Diagnosis not present

## 2021-09-03 DIAGNOSIS — M542 Cervicalgia: Secondary | ICD-10-CM | POA: Diagnosis not present

## 2021-09-03 DIAGNOSIS — I639 Cerebral infarction, unspecified: Secondary | ICD-10-CM | POA: Diagnosis not present

## 2021-09-03 DIAGNOSIS — Z79891 Long term (current) use of opiate analgesic: Secondary | ICD-10-CM | POA: Diagnosis not present

## 2021-09-03 DIAGNOSIS — R569 Unspecified convulsions: Secondary | ICD-10-CM | POA: Diagnosis not present

## 2021-09-03 DIAGNOSIS — M25512 Pain in left shoulder: Secondary | ICD-10-CM | POA: Diagnosis not present

## 2021-09-03 DIAGNOSIS — G4733 Obstructive sleep apnea (adult) (pediatric): Secondary | ICD-10-CM | POA: Diagnosis not present

## 2021-09-03 DIAGNOSIS — M545 Low back pain, unspecified: Secondary | ICD-10-CM | POA: Diagnosis not present

## 2021-09-03 NOTE — Progress Notes (Deleted)
   I, Wendy Poet, LAT, ATC, am serving as scribe for Dr. Lynne Leader.   Jimmy [name] is a 31 y.o. male who presents to [place] [facility] today for f/u of L shoulder/arm weakness.  He has most recently been seen/followed by Dr. Erlinda Hong for his L shoulder after having an open L biceps tenodesis w/ Dr. Erlinda Hong on 06/21/20 and prior L shoulder labral repair w/ biceps tenodesis on 03/01/20.  He was last seen by Dr. Georgina Snell on 06/05/20.  Today, pt reports   Diagnostic testing: L shoulder XR- 04/25/21; CT cervical spine and L shoulder XR- 10/25/20; L shoulder MRI- 06/01/20 and 09/21/19; C-spine MRI- 07/10/19; C-spine XR- 05/24/19 Pertinent review of systems: ***  Relevant historical information: ***   Exam:  There were no vitals taken for this visit. General: Well Developed, well nourished, and in no acute distress.   MSK: ***    Lab and Radiology Results No results found for this or any previous visit (from the past 72 hour(s)). No results found.     Assessment and Plan: 31 y.o. male with ***   PDMP not reviewed this encounter. No orders of the defined types were placed in this encounter.  No orders of the defined types were placed in this encounter.    Discussed warning signs or symptoms. Please see discharge instructions. Patient expresses understanding.   ***

## 2021-09-04 ENCOUNTER — Ambulatory Visit: Payer: 59 | Admitting: Family Medicine

## 2021-09-05 ENCOUNTER — Encounter: Payer: Self-pay | Admitting: Family Medicine

## 2021-09-05 ENCOUNTER — Ambulatory Visit: Payer: 59 | Attending: Family Medicine | Admitting: Family Medicine

## 2021-09-05 VITALS — BP 131/73 | HR 81 | Temp 98.3°F | Ht 64.0 in | Wt 171.0 lb

## 2021-09-05 DIAGNOSIS — M5412 Radiculopathy, cervical region: Secondary | ICD-10-CM | POA: Diagnosis not present

## 2021-09-05 DIAGNOSIS — R29818 Other symptoms and signs involving the nervous system: Secondary | ICD-10-CM

## 2021-09-05 DIAGNOSIS — R569 Unspecified convulsions: Secondary | ICD-10-CM

## 2021-09-05 NOTE — Progress Notes (Signed)
Subjective:  Patient ID: Jimmy [name], male    DOB: Aug 03, 1990  Age: 31 y.o. MRN: [id number]  CC: Hospitalization Follow-up   HPI Jimmy [name] is a 31 y.o. year old male with a history of migraines, bipolar disorder, insomnia, chronic left shoulder pain (status post left shoulder arthroscopy, biceps tenodesis tenolysis in 2021 and 2022)  Interval History: Seen at the ED on 08/28/2021 for seizure-like activity and associated syncope while driving. CT head revealed no acute intracranial findings, chronic sinusitis.  He had a visit with cardiology last week and notes reviewed, echocardiogram scheduled for tomorrow.  Per notes he did have a history of seizure disorder as a child.  Cardiology had also taken him out of work until cardiac work-up is complete.  He had appointment with neurology 2 days ago in [place] and no medication initiated but he is currently being worked up with plans for EEG.  He complains of pain in his head, left shoulder and neck. Headache started 2 days prior to episode.  Of note he does have a history of migraines. Shoulder pain is chronic (and he is status post left shoulder surgery) but he recently lost strength in L arm. He is R hand dominant.  Endorses presence of numbness that radiates from his neck down his left arm.  I had referred him to pain management previously but he was informed by [facility] pain management they did not accept his insurance. He has had fatigue, decreased appetite.  Accompanied by fianc who states that he stops breathing in his sleep, snores as well and she has a video to this effect. Past Medical History:  Diagnosis Date   ADHD (attention deficit hyperactivity disorder)    Anxiety    Arthritis    Cancer ([place])    skin cancer   Chest pain 08/2018   GERD (gastroesophageal reflux disease)    no meds   History of COVID-19 04/05/2020   Migraine    migraines due to pinched nerve   Pinched nerve in neck    Scoliosis    Seizure  [facility])     Past Surgical History:  Procedure Laterality Date   BICEPT TENODESIS Left 06/21/2020   Procedure: BICEPS TENODESIS TENOLYSIS;  Surgeon: Leandrew Koyanagi, MD;  Location: [place];  Service: Orthopedics;  Laterality: Left;   ESOPHAGUS SURGERY     as a baby   SHOULDER ARTHROSCOPY WITH BICEPS TENDON REPAIR Left 03/01/2020   Procedure: LEFT SHOULDER ARTHROSCOPY WITH BICEPS TENODESIS;  Surgeon: Leandrew Koyanagi, MD;  Location: [place];  Service: Orthopedics;  Laterality: Left;   SHOULDER ARTHROSCOPY WITH SUBACROMIAL DECOMPRESSION Right 09/01/2019   Procedure: RIGHT SHOULDER ARTHROSCOPY WITH EXTENSIVE DEBRIDEMENT AND SUBACROMIAL DECOMPRESSION;  Surgeon: Leandrew Koyanagi, MD;  Location: [place];  Service: Orthopedics;  Laterality: Right;   VENTRAL HERNIA REPAIR N/A 08/31/2018   Procedure: HERNIA REPAIR VENTRAL ADULT;  Surgeon: Olean Ree, MD;  Location: [facility];  Service: General;  Laterality: N/A;    Family History  Problem Relation Age of Onset   Hypertension Mother    Diabetes Mother    Kidney cancer Mother    Diabetes Maternal Aunt    Diabetes Maternal Grandmother    Cancer Maternal Grandmother        unknown type. doing chemo   Cancer Maternal Grandfather    Stomach cancer Neg Hx    Pancreatic cancer Neg Hx    Esophageal cancer Neg Hx    Liver  disease Neg Hx    Colon cancer Neg Hx    Rectal cancer Neg Hx     Social History   Socioeconomic History   Marital status: Married    Spouse name: Not on file   Number of children: 3   Years of education: Not on file   Highest education level: Not on file  Occupational History   Occupation: Architect work  Tobacco Use   Smoking status: Every Day    Packs/day: 0.75    Years: 15.00    Total pack years: 11.25    Types: Cigarettes    Passive exposure: Never   Smokeless tobacco: Former  Scientific laboratory technician Use: Never used  Substance and Sexual Activity   Alcohol use: Not  Currently   Drug use: Not Currently   Sexual activity: Yes  Other Topics Concern   Not on file  Social History Narrative   Right handed   One story home    Drinks caffeine    Social Determinants of Health   Financial Resource Strain: Not on file  Food Insecurity: Not on file  Transportation Needs: Not on file  Physical Activity: Not on file  Stress: Not on file  Social Connections: Not on file    Allergies  Allergen Reactions   Cyclobenzaprine Hives and Nausea Only   Depakote [Divalproex Sodium] Other (See Comments)    "made him crazy"   Ritalin [Methylphenidate Hcl] Other (See Comments)    Increased hyper activeness    Naproxen Nausea Only    Acid reflux   Other Other (See Comments)    [facility]-pt states skin became reddened and he tasted metal (08-2018)   Tramadol Other (See Comments)    Acid reflux    Outpatient Medications Prior to Visit  Medication Sig Dispense Refill   AIMOVIG 140 MG/ML SOAJ Inject into the skin every 30 (thirty) days.     gabapentin (NEURONTIN) 400 MG capsule Take 400 mg by mouth 2 (two) times daily.     methocarbamol (ROBAXIN) 500 MG tablet Take 500 mg by mouth in the morning, at noon, in the evening, and at bedtime.     QUEtiapine (SEROQUEL) 100 MG tablet Take 1 tablet (100 mg total) by mouth at bedtime. 30 tablet 6   DULoxetine (CYMBALTA) 60 MG capsule Take 60 mg by mouth daily in the afternoon. (Patient not taking: Reported on 09/05/2021)     Facility-Administered Medications Prior to Visit  Medication Dose Route Frequency Provider Last Rate Last Admin   triamcinolone acetonide (KENALOG) 10 MG/ML injection 20 mg  20 mg Other Once Sheffield, Kelli R, PA-C         ROS Review of Systems  Constitutional:  Positive for fatigue. Negative for activity change and appetite change.  HENT:  Negative for sinus pressure and sore throat.   Eyes:  Negative for visual disturbance.  Respiratory:  Negative for cough, chest tightness and shortness of  breath.   Cardiovascular:  Negative for chest pain and leg swelling.  Gastrointestinal:  Negative for abdominal distention, abdominal pain, constipation and diarrhea.  Endocrine: Negative.   Genitourinary:  Negative for dysuria.  Musculoskeletal:        See HPI  Skin:  Negative for rash.  Allergic/Immunologic: Negative.   Neurological:  Positive for numbness and headaches. Negative for weakness and light-headedness.  Psychiatric/Behavioral:  Negative for dysphoric mood and suicidal ideas.     Objective:  BP 131/73   Pulse 81   Temp 98.3 F (  36.8 C) (Oral)   Ht '5\' 4"'$  (1.626 m)   Wt 171 lb (77.6 kg)   SpO2 97%   BMI 29.35 kg/m      09/05/2021    9:43 AM 08/30/2021    8:48 AM 08/28/2021    2:00 PM  BP/Weight  Systolic BP 229 798 921  Diastolic BP 73 70 75  Wt. (Lbs) 171 170.6   BMI 29.35 kg/m2 29.28 kg/m2       Physical Exam Constitutional:      Appearance: He is well-developed.  Cardiovascular:     Rate and Rhythm: Normal rate.     Heart sounds: Normal heart sounds. No murmur heard. Pulmonary:     Effort: Pulmonary effort is normal.     Breath sounds: Normal breath sounds. No wheezing or rales.  Chest:     Chest wall: No tenderness.  Abdominal:     General: Bowel sounds are normal. There is no distension.     Palpations: Abdomen is soft. There is no mass.     Tenderness: There is no abdominal tenderness.  Musculoskeletal:        General: Normal range of motion.     Cervical back: Normal range of motion. No rigidity or tenderness.     Right lower leg: No edema.     Left lower leg: No edema.  Neurological:     Mental Status: He is alert and oriented to person, place, and time.     Comments: Handgrip: Right hand-5/5 Left hand-4/5  Psychiatric:        Mood and Affect: Mood normal.        Latest Ref Rng & Units 08/28/2021   11:46 AM 04/21/2020   10:51 AM 03/22/2020    4:12 PM  CMP  Glucose 70 - 99 mg/dL 135  96  86   BUN 6 - 20 mg/dL '5  18  13   '$ Creatinine  0.61 - 1.24 mg/dL 0.58  0.82  0.81   Sodium 135 - 145 mmol/L 136  142  137   Potassium 3.5 - 5.1 mmol/L 3.6  4.3  4.4   Chloride 98 - 111 mmol/L 106  110  105   CO2 22 - 32 mmol/L '26  23  25   '$ Calcium 8.9 - 10.3 mg/dL 8.8  9.4  9.4   Total Protein 6.5 - 8.1 g/dL 6.9  6.7  7.0   Total Bilirubin 0.3 - 1.2 mg/dL 0.4  0.4  0.3   Alkaline Phos 38 - 126 U/L 73     AST 15 - 41 U/L '18  15  16   '$ ALT 0 - 44 U/L 19  49  27     Lipid Panel     Component Value Date/Time   CHOL 198 04/21/2020 1051   CHOL 194 02/04/2019 0838   TRIG 89 04/21/2020 1051   HDL 42 04/21/2020 1051   HDL 45 02/04/2019 0838   CHOLHDL 4.7 04/21/2020 1051   LDLCALC 137 (H) 04/21/2020 1051    CBC    Component Value Date/Time   WBC 8.3 08/28/2021 1146   RBC 4.31 08/28/2021 1146   HGB 13.1 08/28/2021 1146   HCT 39.6 08/28/2021 1146   PLT 271 08/28/2021 1146   MCV 91.9 08/28/2021 1146   MCH 30.4 08/28/2021 1146   MCHC 33.1 08/28/2021 1146   RDW 12.7 08/28/2021 1146   LYMPHSABS 2.4 08/28/2021 1146   MONOABS 0.8 08/28/2021 1146   EOSABS 0.4 08/28/2021 1146  BASOSABS 0.0 08/28/2021 1146    Lab Results  Component Value Date   HGBA1C 5.4 08/30/2021    Assessment & Plan:  1. Suspected sleep apnea - Split night study; Future  2. Cervical radiculopathy Uncontrolled CT cervical spine from 10/2020 revealed no acute fracture or traumatic malalignment of cervical spine We will need to evaluate for herniated disc versus nerve compression He is currently on duloxetine, Robaxin and gabapentin - Ambulatory referral to Physical Therapy - MR Cervical Spine Wo Contrast; Future  3. Seizure-like activity ([place]) No recent seizure-like activity since presentation to the ED 2 weeks ago Not driving according to [place] laws Currently being worked up by neurology Scheduled for cardiac evaluation as well    No orders of the defined types were placed in this encounter.   Follow-up: Return in about 6 months (around  03/07/2022) for Chronic medical conditions.       Charlott Rakes, MD, FAAFP. [facility], [place]   09/05/2021, 10:24 AM

## 2021-09-05 NOTE — Progress Notes (Signed)
Having pain in head left shoulder and neck.

## 2021-09-05 NOTE — Patient Instructions (Signed)
Cervical Radiculopathy  Cervical radiculopathy means that a nerve in the neck (a cervical nerve) is pinched or bruised. This can happen because of an injury to the cervical spine (vertebrae) in the neck, or as a normal part of getting older. This condition can cause pain or loss of feeling (numbness) that runs from your neck all the way down to your arm and fingers. Often, this condition gets better with rest. Treatment may be needed if the condition does not get better. What are the causes? A neck injury. A bulging disk in your spine. Sudden muscle tightening (muscle spasms). Tight muscles in your neck due to overuse. Arthritis. Breakdown in the bones and joints of the spine (spondylosis) due to getting older. Bone spurs that form near the nerves in the neck. What are the signs or symptoms? Pain. The pain may: Run from the neck to the arm and hand. Be very bad or irritating. Get worse when you move your neck. Loss of feeling or tingling in your arm or hand. Weakness in your arm or hand, in very bad cases. How is this treated? In many cases, treatment is not needed for this condition. With rest, the condition often gets better over time. If treatment is needed, options may include: Wearing a soft neck collar (cervical collar) for short periods of time. Doing exercises (physical therapy) to strengthen your neck muscles. Taking medicines. Having shots (injections) in your spine, in very bad cases. Having surgery. This may be needed if other treatments do not help. The type of surgery that is used will depend on the cause of your condition. Follow these instructions at home: If you have a soft neck collar: Wear it as told by your doctor. Take it off only as told by your doctor. Ask your doctor if you can take the collar off for cleaning and bathing. If you are allowed to take the collar off for cleaning or bathing: Follow instructions from your doctor about how to take off the collar  safely. Clean the collar by wiping it with mild soap and water and drying it completely. Take out any removable pads in the collar every 1-2 days. Wash them by hand with soap and water. Let them air-dry completely before you put them back in the collar. Check your skin under the collar for redness or sores. If you see any, tell your doctor. Managing pain     Take over-the-counter and prescription medicines only as told by your doctor. If told, put ice on the painful area. To do this: If you have a soft neck collar, take if off as told by your doctor. Put ice in a plastic bag. Place a towel between your skin and the bag. Leave the ice on for 20 minutes, 2-3 times a day. Take off the ice if your skin turns bright red. This is very important. If you cannot feel pain, heat, or cold, you have a greater risk of damage to the area. If using ice does not help, you can try using heat. Use the heat source that your doctor recommends, such as a moist heat pack or a heating pad. Place a towel between your skin and the heat source. Leave the heat on for 20-30 minutes. Take off the heat if your skin turns bright red. This is very important. If you cannot feel pain, heat, or cold, you have a greater risk of getting burned. You may try a gentle neck and shoulder rub (massage). Activity Rest as needed. Return   to your normal activities when your doctor says that it is safe. Do exercises as told by your doctor or physical therapist. You may have to avoid lifting. Ask your doctor how much you can safely lift. General instructions Use a flat pillow when you sleep. Do not drive while wearing a soft neck collar. If you do not have a soft neck collar, ask your doctor if it is safe to drive while your neck heals. Ask your doctor if you should avoid driving or using machines while you are taking your medicine. Do not smoke or use any products that contain nicotine or tobacco. If you need help quitting, ask your  doctor. Keep all follow-up visits. Contact a doctor if: Your condition does not get better with treatment. Get help right away if: Your pain gets worse and medicine does not help. You lose feeling or feel weak in your hand, arm, face, or leg. You have a high fever. Your neck is stiff. You cannot control when you poop or pee (have incontinence). You have trouble with walking, balance, or talking. Summary Cervical radiculopathy means that a nerve in the neck is pinched or bruised. A nerve can get pinched from a bulging disk, arthritis, an injury to the neck, or other causes. Symptoms include pain, tingling, or loss of feeling that goes from the neck to the arm or hand. Weakness in your arm or hand can happen in very bad cases. Treatment may include resting, wearing a soft neck collar, and doing exercises. You might need to take medicines for pain. In very bad cases, shots or surgery may be needed. This information is not intended to replace advice given to you by your health care provider. Make sure you discuss any questions you have with your health care provider. Document Revised: 09/07/2020 Document Reviewed: 09/07/2020 Elsevier Patient Education  2023 Elsevier Inc.  

## 2021-09-06 ENCOUNTER — Other Ambulatory Visit (HOSPITAL_COMMUNITY)
Admission: RE | Admit: 2021-09-06 | Discharge: 2021-09-06 | Disposition: A | Discharge status: Home / Self Care | Payer: 59 | Source: Ambulatory Visit | Attending: Cardiovascular Disease | Admitting: Cardiovascular Disease

## 2021-09-06 ENCOUNTER — Encounter: Payer: Self-pay | Admitting: Cardiovascular Disease

## 2021-09-06 ENCOUNTER — Ambulatory Visit (HOSPITAL_COMMUNITY)
Admission: RE | Admit: 2021-09-06 | Discharge: 2021-09-06 | Disposition: A | Discharge status: Home / Self Care | Payer: 59 | Source: Ambulatory Visit | Attending: Cardiovascular Disease | Admitting: Cardiovascular Disease

## 2021-09-06 ENCOUNTER — Encounter: Payer: Self-pay | Admitting: Neurology

## 2021-09-06 ENCOUNTER — Encounter: Payer: Self-pay | Admitting: Family Medicine

## 2021-09-06 DIAGNOSIS — M5412 Radiculopathy, cervical region: Secondary | ICD-10-CM | POA: Diagnosis not present

## 2021-09-06 DIAGNOSIS — R55 Syncope and collapse: Secondary | ICD-10-CM

## 2021-09-06 DIAGNOSIS — R7989 Other specified abnormal findings of blood chemistry: Secondary | ICD-10-CM

## 2021-09-06 LAB — T4, FREE: Free T4: 0.52 ng/dL — ABNORMAL LOW (ref 0.61–1.12)

## 2021-09-06 LAB — ECHOCARDIOGRAM COMPLETE
Area-P 1/2: 4.68 cm2
S' Lateral: 3.6 cm

## 2021-09-07 ENCOUNTER — Telehealth: Payer: Self-pay

## 2021-09-07 ENCOUNTER — Telehealth: Payer: Self-pay | Admitting: Family Medicine

## 2021-09-07 DIAGNOSIS — R55 Syncope and collapse: Secondary | ICD-10-CM

## 2021-09-07 NOTE — Progress Notes (Deleted)
NEUROLOGY FOLLOW UP OFFICE NOTE  LAURA RADILLA [id number]  Assessment/Plan:   Migraine without aura, without status migrainosus, not intractable - improved but I think we can optimize efficacy   1. Increase Aimovig to '140mg'$  every month 2.  Limit use of pain relievers to no more than 2 days out of week to prevent risk of rebound or medication-overuse headache. 4.  Keep headache diary 5.  Follow up in 9 months.   Subjective:  Jimmy [name] is a 31 year old male with Bipolar disorder whom I see for migraines presents today for seizure-like episodes   UPDATE: On 08/27/2021, he was driving with his girlfriend when he suddenly felt dizzy and nauseous.  He pulled over and then had witnessed seizure-like acativity lasting several minutes.  ***.  He felt foggy afterwards.  He was poorly responsive when EMS arrived.  He had been taking oxycodone for shoulder pain.  Overdose suspected but said he only took one '10mg'$  tablet.  Seen in the ED at [facility] blood draws and left AMA and instead went to [facility] ED the following day.  EKG was normal.  Blood work including CBC and CMP was unremarkable.  UDS was negative ***.  CT head personally reviewed was negative.  Admission was recommended but patient again left.  Saw outpatient cardiology.  2D echocardiogram on 6/22 was unremarkable with EF 60-65%   Increased Aimovig to '140mg'$  last year.  Current NSAIDS:  Unable to take due to GI ulcers Current analgesics:  Tylenol #3 (shoulder) Current triptans: none Current ergotamine:  none Current anti-emetic:  none Current muscle relaxants: tizanidine  Current anti-anxiolytic:  none Current sleep aide:  none Current Antihypertensive medications:  none Current Antidepressant/antipsychotic medications:  Seroquel '100mg'$  at bedtime Current Anticonvulsant medications:  none Current anti-CGRP:  Aimovig '140mg'$  every 28 days Current Vitamins/Herbal/Supplements:  none Current  Antihistamines/Decongestants:  none Other therapy:  none Hormone/birth control:  none   Caffeine:  1 cup of coffee daily; no soda Alcohol:  no Smoker:  1 ppd Diet:  No soda.  8 bottles of water daily. Exercise:  No routine exercise Depression:  yes; Anxiety:  yes Other pain:  shoulder pain s/p shoulder surgery - still hurts Sleep:  Good with quetiapine   HISTORY:  He has history of headaches since 31 years old.  They are left or right-sided pounding/pressure/stabbing pain associated with photophobia, phonophobia but not nausea, vomiting, visual disturbance, paresthesias or unilateral numbness or weakness.  He reports that they are triggered by a "pinched nerve in my neck".  He has associated bilateral neck and shoulder pain.  Cervical X-ray from 05/26/2013 personally reviewed did show mild disc space narrowing at C5-6 but otherwise unremarkable.  No radicular pain, numbness or weakness in the upper extremities.  They are typically persistent, lasting weeks to months.  They may be severe for 2 week periods.  It is usually 4-5/10 intensity but 8/10 when severe.  3 to 4 months he is headache-free between episodes.   Nothing relieves the headache.    01/08/2011 CT BRAIN WO:  No acute intracranial abnormality 07/10/2019 MRI CERVICAL SPINE: right eccentric disc bulge at C3-4 causing mild canal and right C4 foraminal stenosis, as well as mild right C3 and C5 foraminal stenosis.     Past NSAIDS:  Unable to take due to GI ulcers Past analgesics:  Tylenol Past abortive triptans:  Sumatriptan '50mg'$ , rizatriptan '10mg'$  Past abortive ergotamine:  none Past muscle relaxants:  Flexeril, Robaxin  Past anti-emetic:  none Past antihypertensive medications:  none Past antidepressant/antipsychotic medications: amitriptyline '10mg'$  (caused metallic taste);  nortriptyline (bad taste); quetiapine '100mg'$  at bedtime Past anticonvulsant medications:  topiramate '50mg'$  twice daily (caused metallic taste), gabapentin '300mg'$   TID Past anti-CGRP:  Ubrelvy (effective but not covered by insurance) Past vitamins/Herbal/Supplements:  none Past antihistamines/decongestants:  none Other past therapies:  none     Family history of headache:  Mother, aunt, sister    PAST MEDICAL HISTORY: Past Medical History:  Diagnosis Date   ADHD (attention deficit hyperactivity disorder)    Anxiety    Arthritis    Cancer ([place])    skin cancer   Chest pain 08/2018   GERD (gastroesophageal reflux disease)    no meds   History of COVID-19 04/05/2020   Migraine    migraines due to pinched nerve   Pinched nerve in neck    Scoliosis    Seizure ([place])     MEDICATIONS: Current Outpatient Medications on File Prior to Visit  Medication Sig Dispense Refill   AIMOVIG 140 MG/ML SOAJ Inject into the skin every 30 (thirty) days.     DULoxetine (CYMBALTA) 60 MG capsule Take 60 mg by mouth daily in the afternoon. (Patient not taking: Reported on 09/05/2021)     gabapentin (NEURONTIN) 400 MG capsule Take 400 mg by mouth 2 (two) times daily.     methocarbamol (ROBAXIN) 500 MG tablet Take 500 mg by mouth in the morning, at noon, in the evening, and at bedtime.     QUEtiapine (SEROQUEL) 100 MG tablet Take 1 tablet (100 mg total) by mouth at bedtime. 30 tablet 6   [DISCONTINUED] famotidine (PEPCID) 40 MG tablet Take 1 tablet (40 mg total) by mouth daily. 90 tablet 1   Current Facility-Administered Medications on File Prior to Visit  Medication Dose Route Frequency Provider Last Rate Last Admin   triamcinolone acetonide (KENALOG) 10 MG/ML injection 20 mg  20 mg Other Once Sheffield, Kelli R, PA-C        ALLERGIES: Allergies  Allergen Reactions   Cyclobenzaprine Hives and Nausea Only   Depakote [Divalproex Sodium] Other (See Comments)    "made him crazy"   Ritalin [Methylphenidate Hcl] Other (See Comments)    Increased hyper activeness    Naproxen Nausea Only    Acid reflux   Other Other (See Comments)    [facility]-pt states skin  became reddened and he tasted metal (08-2018)   Tramadol Other (See Comments)    Acid reflux    FAMILY HISTORY: Family History  Problem Relation Age of Onset   Hypertension Mother    Diabetes Mother    Kidney cancer Mother    Diabetes Maternal Aunt    Diabetes Maternal Grandmother    Cancer Maternal Grandmother        unknown type. doing chemo   Cancer Maternal Grandfather    Stomach cancer Neg Hx    Pancreatic cancer Neg Hx    Esophageal cancer Neg Hx    Liver disease Neg Hx    Colon cancer Neg Hx    Rectal cancer Neg Hx       Objective:  *** General: No acute distress.  Patient appears well-groomed.   Head:  Normocephalic/atraumatic Eyes:  Fundi examined but not visualized Neck: supple, no paraspinal tenderness, full range of motion Heart:  Regular rate and rhythm Lungs:  Clear to auscultation bilaterally Back: No paraspinal tenderness Neurological Exam: alert and oriented to person, place, and time.  Speech fluent  and not dysarthric, language intact.  CN II-XII intact. Bulk and tone normal, muscle strength 5/5 throughout.  Sensation to light touch intact.  Deep tendon reflexes 2+ throughout, toes downgoing.  Finger to nose testing intact.  Gait normal, Romberg negative.   Metta Clines, DO  CC: Charlott Rakes, MD

## 2021-09-08 LAB — T3: T3, Total: 90 ng/dL (ref 71–180)

## 2021-09-10 ENCOUNTER — Other Ambulatory Visit (HOSPITAL_COMMUNITY): Payer: Self-pay | Admitting: Neurology

## 2021-09-10 ENCOUNTER — Encounter: Payer: Self-pay | Admitting: Neurology

## 2021-09-10 ENCOUNTER — Other Ambulatory Visit: Payer: Self-pay | Admitting: Neurology

## 2021-09-10 ENCOUNTER — Ambulatory Visit: Payer: 59 | Admitting: Neurology

## 2021-09-10 DIAGNOSIS — Z029 Encounter for administrative examinations, unspecified: Secondary | ICD-10-CM

## 2021-09-10 DIAGNOSIS — I639 Cerebral infarction, unspecified: Secondary | ICD-10-CM

## 2021-09-10 NOTE — Telephone Encounter (Signed)
Routing to pcp for review  Another cardiologist for 2nd opinion

## 2021-09-10 NOTE — Telephone Encounter (Signed)
Called pt and unable to leave vm

## 2021-09-13 ENCOUNTER — Encounter: Payer: Self-pay | Admitting: Emergency Medicine

## 2021-09-13 ENCOUNTER — Ambulatory Visit (HOSPITAL_COMMUNITY): Payer: 59

## 2021-09-13 ENCOUNTER — Other Ambulatory Visit: Payer: Self-pay

## 2021-09-13 ENCOUNTER — Ambulatory Visit
Admission: EM | Admit: 2021-09-13 | Discharge: 2021-09-13 | Disposition: A | Discharge status: Home / Self Care | Payer: 59 | Attending: Nurse Practitioner | Admitting: Nurse Practitioner

## 2021-09-13 DIAGNOSIS — G8929 Other chronic pain: Secondary | ICD-10-CM | POA: Insufficient documentation

## 2021-09-13 DIAGNOSIS — L0501 Pilonidal cyst with abscess: Secondary | ICD-10-CM | POA: Diagnosis not present

## 2021-09-13 MED ORDER — DOXYCYCLINE HYCLATE 100 MG PO TABS: 100.0000 mg | ORAL_TABLET | Freq: Two times a day (BID) | ORAL | 0 refills | Status: DC

## 2021-09-13 MED ORDER — OXYCODONE-ACETAMINOPHEN 5-325 MG PO TABS: 1.0000 | ORAL_TABLET | Freq: Three times a day (TID) | ORAL | 0 refills | Status: DC | PRN

## 2021-09-13 MED ORDER — KETOROLAC TROMETHAMINE 60 MG/2ML IM SOLN
60.0000 mg | Freq: Once | INTRAMUSCULAR | Status: AC
  Administered 2021-09-13: 60 mg via INTRAMUSCULAR

## 2021-09-13 NOTE — ED Notes (Signed)
Site dressed with telfa and secured with medipore tape. Pt tolerated well. Site management and infection prevention education provided.

## 2021-09-13 NOTE — Progress Notes (Signed)
Jimmy [name], am serving as a Education administrator for Dr. Lynne Leader.  Jimmy [name] is a 31 y.o. male who presents to [place] at [facility] today for f/u of L shoulder pain.  He was last seen by Dr. Georgina Snell on 06/05/20 after having a L shoulder labral repair and biceps tenodesis w/ Dr. Erlinda Hong on 03/01/20.  He then had another L shoulder surgery (L open biceps tenodesis) in April 2022.  He was seen at the [facility] on 04/20/21 w/ c/o acute-onset L ant shoulder pain.  Today, pt reports that the strength is still gone and now it is getting to the point that he cannot lay on it. Patient saw his surgeon and he acted like nothing was wrong.    In the interim Jimmy [name] had either a syncopal event or a seizure.  He presented to the emergency room and is currently in the process of being evaluated more thoroughly by neurology.  He has a MRI scheduled of his brain and cervical spine for July 12.  Additionally he has an EEG coming up in the near future with neurology.  He reports that he get a checkup with cardiology who thinks that his symptoms were less likely to be a cardiogenic cause.  Diagnostic testing: L shoulder XR- 04/25/21 and 10/25/20; L shoulder MRI arthrogram- 06/01/20 and 09/21/19; L shoulder and c-spine XR- 04/25/20;   Pertinent review of systems: No fevers or chills  Relevant historical information: History of a SLAP tear.  History of opioid dependency.  Of note urine drug screen was negative recently in the emergency room.   Exam:  BP 130/90   Pulse 84   Ht '5\' 4"'$  (1.626 m)   Wt 171 lb (77.6 kg)   SpO2 97%   BMI 29.35 kg/m  General: Well Developed, well nourished, and in no acute distress.   MSK: Left shoulder: Normal-appearing Normal motion some pain with abduction. Positive Hawkins and Neer's test. Strength 4+/5 abduction 5/5 external and internal rotation. Negative O'Brien's test    Lab and Radiology Results  Procedure: Real-time Ultrasound Guided Injection of left  shoulder subacromial bursa Device: Philips Affiniti 50G Images permanently stored and available for review in PACS Ultrasound evaluation prior to injection reveals mild subacromial bursitis. Verbal informed consent obtained.  Discussed risks and benefits of procedure. Warned about infection, bleeding, hyperglycemia damage to structures among others. Patient expresses understanding and agreement Time-out conducted.   Noted no overlying erythema, induration, or other signs of local infection.   Skin prepped in a sterile fashion.   Local anesthesia: Topical Ethyl chloride.   With sterile technique and under real time ultrasound guidance: 40 mg of Kenalog and 2 mL of Marcaine injected into subacromial bursa. Fluid seen entering the bursa.   Completed without difficulty   Pain immediately resolved suggesting accurate placement of the medication.   Advised to call if fevers/chills, erythema, induration, drainage, or persistent bleeding.   Images permanently stored and available for review in the ultrasound unit.  Impression: Technically successful ultrasound guided injection.        Assessment and Plan: 31 y.o. male with chronic and persistent left shoulder pain with some pain radiating all the way down his arm to his hand.  Pain is thought to be multifactorial.  He has had prior left shoulder injury and now 2 surgeries to the left shoulder.  It is possible that he has had an of injury to the shoulder now that he has chronic pain despite  having good surgical outcome and good functional shoulder.  Ultrasound today does show subacromial bursitis which I did treat with a subacromial injection.  I hope this will provide good lasting relief.  Additionally he could have cervical radiculopathy to explain some of his pain.  He has an upcoming cervical spine MRI scheduled for July 12 which should be revealing.  We will send me an update once he gets his MRI done and we can check on the results of his  cervical spine MRI to evaluate for potential cervical radiculopathy.   PDMP not reviewed this encounter. Orders Placed This Encounter  Procedures   [place] LIMITED JOINT SPACE STRUCTURES UP RIGHT(NO LINKED CHARGES)    Standing Status:   Future    Number of Occurrences:   1    Standing Expiration Date:   03/16/2022    Order Specific Question:   Reason for Exam (SYMPTOM  OR DIAGNOSIS REQUIRED)    Answer:   Right shoulder pain    Order Specific Question:   Preferred imaging location?    Answer:   [place]   No orders of the defined types were placed in this encounter.    Discussed warning signs or symptoms. Please see discharge instructions. Patient expresses understanding.   The above documentation has been reviewed and is accurate and complete Lynne Leader, M.D.

## 2021-09-13 NOTE — Discharge Instructions (Addendum)
Take medication as prescribed. Leave the packing in place until you are seen for recheck in 48 hours. Warm compresses to the affected area 3-4 times daily until symptoms improve. Clean the area twice daily with Dial Gold bar soap.  This is an antibacterial soap to help with healing. Avoid getting stool inside of the incision. Go to the emergency department if you develop fever, chills, increased or worsening drainage, chest pain, abdominal pain, or other concerns. Follow-up in 48 hours for recheck.

## 2021-09-13 NOTE — ED Provider Notes (Signed)
[facility]    CSN: [id number] Arrival date & time: 09/13/21  1100      History   Chief Complaint Chief Complaint  Patient presents with   Skin Problem    HPI Jimmy [name] is a 31 y.o. male.   HPI  Presents with a bump to the top of his left buttocks have been present for the past 1-1/2 weeks.  Patient states area has increased in size, become more painful, and started draining last evening.  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  He states his last 1 was approximately 2 years ago.  Patient was advised at that time that he should follow-up with rheumatology/surgery for intervention.  He has been taking over-the-counter Tylenol and ibuprofen with minimal relief of his pain.  He denies fever, chills, chest pain, abdominal pain, nausea, vomiting, or diarrhea.  Past Medical History:  Diagnosis Date   ADHD (attention deficit hyperactivity disorder)    Anxiety    Arthritis    Cancer ([place])    skin cancer   Chest pain 08/2018   GERD (gastroesophageal reflux disease)    no meds   History of COVID-19 04/05/2020   Migraine    migraines due to pinched nerve   Pinched nerve in neck    Scoliosis    Seizure [facility])     Patient Active Problem List   Diagnosis Date Noted   History of Abnormal LFTs (liver function tests) 12/30/2019   Abnormal liver ultrasound 12/30/2019   Fatty liver 12/30/2019   Right flank pain 12/30/2019   Abdominal wall pain 12/30/2019   Gastroesophageal reflux disease without esophagitis 12/30/2019   Superior labrum anterior-to-posterior (SLAP) tear of left shoulder 10/01/2019   Carpal tunnel syndrome 09/08/2019   Bursitis of shoulder, right 08/31/2019   Incisional hernia, without obstruction or gangrene 05/20/2018   Opiate dependence ([place]) 08/02/2013    Past Surgical History:  Procedure Laterality Date   BICEPT TENODESIS Left 06/21/2020   Procedure: BICEPS TENODESIS TENOLYSIS;  Surgeon: Leandrew Koyanagi, MD;  Location: [place];  Service: Orthopedics;  Laterality: Left;   ESOPHAGUS SURGERY     as a baby   SHOULDER ARTHROSCOPY WITH BICEPS TENDON REPAIR Left 03/01/2020   Procedure: LEFT SHOULDER ARTHROSCOPY WITH BICEPS TENODESIS;  Surgeon: Leandrew Koyanagi, MD;  Location: [place];  Service: Orthopedics;  Laterality: Left;   SHOULDER ARTHROSCOPY WITH SUBACROMIAL DECOMPRESSION Right 09/01/2019   Procedure: RIGHT SHOULDER ARTHROSCOPY WITH EXTENSIVE DEBRIDEMENT AND SUBACROMIAL DECOMPRESSION;  Surgeon: Leandrew Koyanagi, MD;  Location: [place];  Service: Orthopedics;  Laterality: Right;   VENTRAL HERNIA REPAIR N/A 08/31/2018   Procedure: HERNIA REPAIR VENTRAL ADULT;  Surgeon: Olean Ree, MD;  Location: [facility];  Service: General;  Laterality: N/A;       Home Medications    Prior to Admission medications   Medication Sig Start Date End Date Taking? Authorizing Provider  doxycycline (VIBRA-TABS) 100 MG tablet Take 1 tablet (100 mg total) by mouth 2 (two) times daily. 09/13/21  Yes Shahil Speegle-Warren, Alda Lea, NP  oxyCODONE-acetaminophen (PERCOCET/ROXICET) 5-325 MG tablet Take 1 tablet by mouth every 8 (eight) hours as needed for severe pain. 09/13/21  Yes Artur Winningham-Warren, Alda Lea, NP  AIMOVIG 140 MG/ML SOAJ Inject into the skin every 30 (thirty) days. 08/09/21   [provider]  DULoxetine (CYMBALTA) 60 MG capsule Take 60 mg by mouth daily in the afternoon. Patient not taking: Reported on 09/05/2021    [provider]  gabapentin (NEURONTIN) 400 MG capsule Take 400 mg by mouth 2 (two) times daily. 07/18/21   [provider]  methocarbamol (ROBAXIN) 500 MG tablet Take 500 mg by mouth in the morning, at noon, in the evening, and at bedtime.    [provider]  QUEtiapine (SEROQUEL) 100 MG tablet Take 1 tablet (100 mg total) by mouth at bedtime. 11/14/20   Charlott Rakes, MD  famotidine (PEPCID) 40 MG tablet Take 1 tablet (40 mg total) by mouth daily.  08/26/18 12/28/19  Robyn Haber, MD    Family History Family History  Problem Relation Age of Onset   Hypertension Mother    Diabetes Mother    Kidney cancer Mother    Diabetes Maternal Aunt    Diabetes Maternal Grandmother    Cancer Maternal Grandmother        unknown type. doing chemo   Cancer Maternal Grandfather    Stomach cancer Neg Hx    Pancreatic cancer Neg Hx    Esophageal cancer Neg Hx    Liver disease Neg Hx    Colon cancer Neg Hx    Rectal cancer Neg Hx     Social History Social History   Tobacco Use   Smoking status: Every Day    Packs/day: 0.75    Years: 15.00    Total pack years: 11.25    Types: Cigarettes    Passive exposure: Never   Smokeless tobacco: Former  Scientific laboratory technician Use: Never used  Substance Use Topics   Alcohol use: Not Currently   Drug use: Not Currently     Allergies   Cyclobenzaprine, Depakote [divalproex sodium], Ritalin [methylphenidate hcl], Naproxen, Other, and Tramadol   Review of Systems Review of Systems Per HPI  Physical Exam Triage Vital Signs ED Triage Vitals [09/13/21 1111]  Enc Vitals Group     BP 129/80     Pulse Rate 80     Resp 18     Temp 98.2 F (36.8 C)     Temp Source Oral     SpO2 97 %     Weight      Height      Head Circumference      Peak Flow      Pain Score 10     Pain Loc      Pain Edu?      Excl. in [place]?    No data found.  Updated Vital Signs BP 129/80 (BP Location: Right Arm)   Pulse 80   Temp 98.2 F (36.8 C) (Oral)   Resp 18   SpO2 97%   Visual Acuity Right Eye Distance:   Left Eye Distance:   Bilateral Distance:    Right Eye Near:   Left Eye Near:    Bilateral Near:     Physical Exam Vitals and nursing note reviewed.  Constitutional:      Appearance: Normal appearance.  HENT:     Head: Normocephalic.     Nose: Nose normal.  Eyes:     Extraocular Movements: Extraocular movements intact.     Pupils: Pupils are equal, round, and reactive to light.   Pulmonary:     Effort: Pulmonary effort is normal.     Breath sounds: Normal breath sounds.  Abdominal:     General: Bowel sounds are normal.     Palpations: Abdomen is soft.  Musculoskeletal:     Cervical back: Normal range of motion.  Skin:    General:  Skin is warm and dry.     Capillary Refill: Capillary refill takes less than 2 seconds.     Findings: Abscess present.     Comments: Pilonidal cyst located to the left buttock.  Central area of cyst has crusting with surrounding fluctuance.  Area is tender to palpation and warm.  Neurological:     General: No focal deficit present.     Mental Status: He is alert and oriented to person, place, and time.  Psychiatric:        Mood and Affect: Mood normal.        Behavior: Behavior normal.      UC Treatments / Results  Labs (all labs ordered are listed, but only abnormal results are displayed) Labs Reviewed  AEROBIC CULTURE W GRAM STAIN (SUPERFICIAL SPECIMEN)    EKG   Radiology No results found.  Procedures Incision and Drainage  Date/Time: 09/13/2021 12:20 PM  Performed by: Tish Men, NP Authorized by: Tish Men, NP   Consent:    Consent obtained:  Verbal   Consent given by:  Patient   Risks discussed:  Bleeding, incomplete drainage, pain and damage to other organs   Alternatives discussed:  No treatment Universal protocol:    Procedure explained and questions answered to patient or proxy's satisfaction: yes     Test results available : no     Imaging studies available: no     Patient identity confirmed:  Verbally with patient and arm band Location:    Type:  Pilonidal cyst   Location: intergluteal cleft. Pre-procedure details:    Skin preparation:  Betadine and povidone-iodine Sedation:    Sedation type:  None Anesthesia:    Anesthesia method:  Local infiltration   Local anesthetic:  Lidocaine 2% w/o epi Procedure type:    Complexity:  Complex Procedure details:     Ultrasound guidance: no     Needle aspiration: no     Incision types:  Single straight   Incision depth:  Subcutaneous   Wound management:  Probed and deloculated, irrigated with saline and extensive cleaning   Drainage:  Purulent and serosanguinous   Drainage amount:  Moderate   Wound treatment:  Wound left open   Packing materials:  1/2 in iodoform gauze Post-procedure details:    Procedure completion:  Tolerated well, no immediate complications Comments:     1 inch iodoform gauze was cut in half for packing.  Difficult to place gauze at time of procedure due to patient inability to tolerate packing.  Approximately 2" iodoform gauze placed.  (including critical care time)  Medications Ordered in UC Medications  ketorolac (TORADOL) injection 60 mg (60 mg Intramuscular Given 09/13/21 1223)    Initial Impression / Assessment and Plan / UC Course  I have reviewed the triage vital signs and the nursing notes.  Pertinent labs & imaging results that were available during my care of the patient were reviewed by me and considered in my medical decision making (see chart for details).  Patient presents for pilonidal cyst has been present for the past week and a half.  I&D was performed of the cyst located on the intergluteal cleft.  Moderate serosanguineous drainage was returned, Gram stain was also ordered.  The patient tolerated the procedure well.  Doxycycline was initiated.  Patient was advised to return in 48 hours for wound check and for possible repacking.  Difficulty packing the area today as patient was unable to tolerate.  Patient was advised that he  should consider following up with dermatology/general surgery for further evaluation to prevent fissure or there are complications.  Patient was given strict indications of when to follow-up versus going to the emergency department. Final Clinical Impressions(s) / UC Diagnoses   Final diagnoses:  Pilonidal cyst with abscess      Discharge Instructions      Take medication as prescribed. Leave the packing in place until you are seen for recheck in 48 hours. Warm compresses to the affected area 3-4 times daily until symptoms improve. Clean the area twice daily with Dial Gold bar soap.  This is an antibacterial soap to help with healing. Avoid getting stool inside of the incision. Go to the emergency department if you develop fever, chills, increased or worsening drainage, chest pain, abdominal pain, or other concerns. Follow-up in 48 hours for recheck.       ED Prescriptions     Medication Sig Dispense Auth. Provider   doxycycline (VIBRA-TABS) 100 MG tablet Take 1 tablet (100 mg total) by mouth 2 (two) times daily. 20 tablet Dejane Scheibe-Warren, Alda Lea, NP   oxyCODONE-acetaminophen (PERCOCET/ROXICET) 5-325 MG tablet Take 1 tablet by mouth every 8 (eight) hours as needed for severe pain. 10 tablet Katricia Prehn-Warren, Alda Lea, NP      I have reviewed the PDMP during this encounter.   Tish Men, NP 09/13/21 1312

## 2021-09-13 NOTE — Progress Notes (Signed)
Office Visit Note   Patient: Jimmy [name]           Date of Birth: 25-Jul-1990           MRN: [id number] Visit Date: 09/14/2021              Requested by: Charlott Rakes, MD [address],  [place] [id number] PCP: Charlott Rakes, MD   Assessment & Plan: Visit Diagnoses:  1. Chronic left shoulder pain     Plan: Impression is dysesthesias and subjective weakness left upper extremity status post seizures.  I think this is consistent with postictal recovery.  There is no focal motor or sensory deficits on exam and evaluation today.  His surgical site looks fully healed and without any acute problems.  I feel that his symptoms should fully resolve over time.  Follow-up as needed.  Follow-Up Instructions: No follow-ups on file.   Orders:  No orders of the defined types were placed in this encounter.  No orders of the defined types were placed in this encounter.     Procedures: No procedures performed   Clinical Data: No additional findings.   Subjective: No chief complaint on file.   HPI Patient comes in today for evaluation of subjective weakness and mainly grip strength in his left hand status post seizure about 2 weeks ago.  Denies injury to the shoulder.  He is about 14 months status post left proximal biceps tendon tenodesis.  Review of Systems  Constitutional: Negative.   All other systems reviewed and are negative.   Objective: Vital Signs: There were no vitals taken for this visit.  Physical Exam Vitals and nursing note reviewed.  Constitutional:      Appearance: He is well-developed.  Pulmonary:     Effort: Pulmonary effort is normal.  Abdominal:     Palpations: Abdomen is soft.  Skin:    General: Skin is warm.  Neurological:     Mental Status: He is alert and oriented to person, place, and time.  Psychiatric:        Behavior: Behavior normal.        Thought Content: Thought content normal.        Judgment: Judgment normal.    Ortho Exam Examination left shoulder shows full active and passive range of motion that is symmetric.  No focal motor deficits of C5-T1.  Slight Popeye deformity that is stable.  Subjective decrease sensation in all fingertips.  No significant weakness in grip strength or finger abduction compared to contralateral side.  Normal capillary refill.  Specialty Comments:  No specialty comments available.  Imaging: No results found.   PMFS History: Patient Active Problem List   Diagnosis Date Noted   Chronic left shoulder pain 09/13/2021   History of Abnormal LFTs (liver function tests) 12/30/2019   Abnormal liver ultrasound 12/30/2019   Fatty liver 12/30/2019   Right flank pain 12/30/2019   Abdominal wall pain 12/30/2019   Gastroesophageal reflux disease without esophagitis 12/30/2019   Superior labrum anterior-to-posterior (SLAP) tear of left shoulder 10/01/2019   Carpal tunnel syndrome 09/08/2019   Bursitis of shoulder, right 08/31/2019   Incisional hernia, without obstruction or gangrene 05/20/2018   Opiate dependence ([place]) 08/02/2013   Past Medical History:  Diagnosis Date   ADHD (attention deficit hyperactivity disorder)    Anxiety    Arthritis    Cancer ([place])    skin cancer   Chest pain 08/2018   GERD (gastroesophageal reflux disease)  no meds   History of COVID-19 04/05/2020   Migraine    migraines due to pinched nerve   Pinched nerve in neck    Scoliosis    Seizure ([place])     Family History  Problem Relation Age of Onset   Hypertension Mother    Diabetes Mother    Kidney cancer Mother    Diabetes Maternal Aunt    Diabetes Maternal Grandmother    Cancer Maternal Grandmother        unknown type. doing chemo   Cancer Maternal Grandfather    Stomach cancer Neg Hx    Pancreatic cancer Neg Hx    Esophageal cancer Neg Hx    Liver disease Neg Hx    Colon cancer Neg Hx    Rectal cancer Neg Hx     Past Surgical History:  Procedure Laterality Date   BICEPT  TENODESIS Left 06/21/2020   Procedure: BICEPS TENODESIS TENOLYSIS;  Surgeon: Leandrew Koyanagi, MD;  Location: [place];  Service: Orthopedics;  Laterality: Left;   ESOPHAGUS SURGERY     as a baby   SHOULDER ARTHROSCOPY WITH BICEPS TENDON REPAIR Left 03/01/2020   Procedure: LEFT SHOULDER ARTHROSCOPY WITH BICEPS TENODESIS;  Surgeon: Leandrew Koyanagi, MD;  Location: [place];  Service: Orthopedics;  Laterality: Left;   SHOULDER ARTHROSCOPY WITH SUBACROMIAL DECOMPRESSION Right 09/01/2019   Procedure: RIGHT SHOULDER ARTHROSCOPY WITH EXTENSIVE DEBRIDEMENT AND SUBACROMIAL DECOMPRESSION;  Surgeon: Leandrew Koyanagi, MD;  Location: [place];  Service: Orthopedics;  Laterality: Right;   VENTRAL HERNIA REPAIR N/A 08/31/2018   Procedure: HERNIA REPAIR VENTRAL ADULT;  Surgeon: Olean Ree, MD;  Location: [facility];  Service: General;  Laterality: N/A;   Social History   Occupational History   Occupation: Architect work  Tobacco Use   Smoking status: Every Day    Packs/day: 0.75    Years: 15.00    Total pack years: 11.25    Types: Cigarettes    Passive exposure: Never   Smokeless tobacco: Former  Scientific laboratory technician Use: Never used  Substance and Sexual Activity   Alcohol use: Not Currently   Drug use: Not Currently   Sexual activity: Yes

## 2021-09-13 NOTE — ED Triage Notes (Signed)
Pt reports "painful bump" to top of buttocks x1.5 weeks. Pt reports has taken otc tylenol and ibuprofen and reports does not help with pain.pt reports history of similar. Denies fever, chills, nausea.

## 2021-09-14 ENCOUNTER — Ambulatory Visit (INDEPENDENT_AMBULATORY_CARE_PROVIDER_SITE_OTHER): Payer: 59 | Admitting: Family Medicine

## 2021-09-14 ENCOUNTER — Ambulatory Visit: Payer: Self-pay

## 2021-09-14 ENCOUNTER — Ambulatory Visit (INDEPENDENT_AMBULATORY_CARE_PROVIDER_SITE_OTHER): Payer: 59 | Admitting: Orthopaedic Surgery

## 2021-09-14 VITALS — BP 130/90 | HR 84 | Ht 64.0 in | Wt 171.0 lb

## 2021-09-14 DIAGNOSIS — M25512 Pain in left shoulder: Secondary | ICD-10-CM

## 2021-09-14 DIAGNOSIS — G8929 Other chronic pain: Secondary | ICD-10-CM | POA: Diagnosis not present

## 2021-09-14 NOTE — Patient Instructions (Addendum)
Good to see you  Injection given in left shoulder today Call or go to the ER if you develop a large red swollen joint with extreme pain or oozing puss.   Let me know when you get your neck MRI is done We will follow up after that

## 2021-09-15 ENCOUNTER — Ambulatory Visit
Admission: EM | Admit: 2021-09-15 | Discharge: 2021-09-15 | Disposition: A | Discharge status: Home / Self Care | Payer: 59

## 2021-09-15 DIAGNOSIS — L0591 Pilonidal cyst without abscess: Secondary | ICD-10-CM

## 2021-09-15 NOTE — ED Triage Notes (Signed)
Pt reports pain and yellow drainage top of buttocks x 2 days after abscess was drained.

## 2021-09-15 NOTE — ED Notes (Signed)
Non-adherent pad applied and secured with perforated tape. Pt tolerated well.   Site education, infection prevention, and need for follow-up reviewed with pt.

## 2021-09-16 NOTE — ED Provider Notes (Signed)
[facility]    CSN: [id number] Arrival date & time: 09/15/21  1503      History   Chief Complaint Chief Complaint  Patient presents with   Abscess    HPI Jimmy [name] is a 31 y.o. male.   Presenting today following up on bilateral abscess which was drained 2 days ago at this clinic.  He has been on doxycycline since this appointment, states the area has not necessarily worsened but is still draining thick yellow drainage and very painful.  He states that a small piece of packing material came out in the shower today.  Denies fever, chills, body aches, sweats.  Has not yet been able to follow-up with general surgery for recheck.    Past Medical History:  Diagnosis Date   ADHD (attention deficit hyperactivity disorder)    Anxiety    Arthritis    Cancer ([place])    skin cancer   Chest pain 08/2018   GERD (gastroesophageal reflux disease)    no meds   History of COVID-19 04/05/2020   Migraine    migraines due to pinched nerve   Pinched nerve in neck    Scoliosis    Seizure [facility])     Patient Active Problem List   Diagnosis Date Noted   Chronic left shoulder pain 09/13/2021   History of Abnormal LFTs (liver function tests) 12/30/2019   Abnormal liver ultrasound 12/30/2019   Fatty liver 12/30/2019   Right flank pain 12/30/2019   Abdominal wall pain 12/30/2019   Gastroesophageal reflux disease without esophagitis 12/30/2019   Superior labrum anterior-to-posterior (SLAP) tear of left shoulder 10/01/2019   Carpal tunnel syndrome 09/08/2019   Bursitis of shoulder, right 08/31/2019   Incisional hernia, without obstruction or gangrene 05/20/2018   Opiate dependence ([place]) 08/02/2013    Past Surgical History:  Procedure Laterality Date   BICEPT TENODESIS Left 06/21/2020   Procedure: BICEPS TENODESIS TENOLYSIS;  Surgeon: Leandrew Koyanagi, MD;  Location: [place];  Service: Orthopedics;  Laterality: Left;   ESOPHAGUS SURGERY     as a baby    SHOULDER ARTHROSCOPY WITH BICEPS TENDON REPAIR Left 03/01/2020   Procedure: LEFT SHOULDER ARTHROSCOPY WITH BICEPS TENODESIS;  Surgeon: Leandrew Koyanagi, MD;  Location: [place];  Service: Orthopedics;  Laterality: Left;   SHOULDER ARTHROSCOPY WITH SUBACROMIAL DECOMPRESSION Right 09/01/2019   Procedure: RIGHT SHOULDER ARTHROSCOPY WITH EXTENSIVE DEBRIDEMENT AND SUBACROMIAL DECOMPRESSION;  Surgeon: Leandrew Koyanagi, MD;  Location: [place];  Service: Orthopedics;  Laterality: Right;   VENTRAL HERNIA REPAIR N/A 08/31/2018   Procedure: HERNIA REPAIR VENTRAL ADULT;  Surgeon: Olean Ree, MD;  Location: [facility];  Service: General;  Laterality: N/A;       Home Medications    Prior to Admission medications   Medication Sig Start Date End Date Taking? Authorizing Provider  AIMOVIG 140 MG/ML SOAJ Inject into the skin every 30 (thirty) days. 08/09/21   [provider]  doxycycline (VIBRA-TABS) 100 MG tablet Take 1 tablet (100 mg total) by mouth 2 (two) times daily. 09/13/21   Leath-Warren, Alda Lea, NP  DULoxetine (CYMBALTA) 60 MG capsule Take 60 mg by mouth daily in the afternoon.    [provider]  gabapentin (NEURONTIN) 400 MG capsule Take 400 mg by mouth 2 (two) times daily. 07/18/21   [provider]  methocarbamol (ROBAXIN) 500 MG tablet Take 500 mg by mouth in the morning, at noon, in the evening, and at bedtime.  [provider]  oxyCODONE-acetaminophen (PERCOCET/ROXICET) 5-325 MG tablet Take 1 tablet by mouth every 8 (eight) hours as needed for severe pain. 09/13/21   Leath-Warren, Alda Lea, NP  QUEtiapine (SEROQUEL) 100 MG tablet Take 1 tablet (100 mg total) by mouth at bedtime. 11/14/20   Charlott Rakes, MD  famotidine (PEPCID) 40 MG tablet Take 1 tablet (40 mg total) by mouth daily. 08/26/18 12/28/19  Robyn Haber, MD    Family History Family History  Problem Relation Age of Onset   Hypertension Mother    Diabetes  Mother    Kidney cancer Mother    Diabetes Maternal Aunt    Diabetes Maternal Grandmother    Cancer Maternal Grandmother        unknown type. doing chemo   Cancer Maternal Grandfather    Stomach cancer Neg Hx    Pancreatic cancer Neg Hx    Esophageal cancer Neg Hx    Liver disease Neg Hx    Colon cancer Neg Hx    Rectal cancer Neg Hx     Social History Social History   Tobacco Use   Smoking status: Every Day    Packs/day: 0.75    Years: 15.00    Total pack years: 11.25    Types: Cigarettes    Passive exposure: Never   Smokeless tobacco: Former  Scientific laboratory technician Use: Never used  Substance Use Topics   Alcohol use: Not Currently   Drug use: Not Currently     Allergies   Cyclobenzaprine, Depakote [divalproex sodium], Ritalin [methylphenidate hcl], Naproxen, Other, and Tramadol   Review of Systems Review of Systems Per HPI  Physical Exam Triage Vital Signs ED Triage Vitals  Enc Vitals Group     BP 09/15/21 1542 135/76     Pulse Rate 09/15/21 1542 79     Resp 09/15/21 1542 16     Temp 09/15/21 1542 98 F (36.7 C)     Temp Source 09/15/21 1542 Oral     SpO2 09/15/21 1542 97 %     Weight --      Height --      Head Circumference --      Peak Flow --      Pain Score 09/15/21 1541 8     Pain Loc --      Pain Edu? --      Excl. in [place]? --    No data found.  Updated Vital Signs BP 135/76 (BP Location: Right Arm)   Pulse 79   Temp 98 F (36.7 C) (Oral)   Resp 16   SpO2 97%   Visual Acuity Right Eye Distance:   Left Eye Distance:   Bilateral Distance:    Right Eye Near:   Left Eye Near:    Bilateral Near:     Physical Exam Vitals and nursing note reviewed.  Constitutional:      Appearance: Normal appearance.  HENT:     Head: Atraumatic.  Eyes:     Extraocular Movements: Extraocular movements intact.     Conjunctiva/sclera: Conjunctivae normal.  Cardiovascular:     Rate and Rhythm: Normal rate and regular rhythm.  Pulmonary:     Effort:  Pulmonary effort is normal.     Breath sounds: Normal breath sounds.  Musculoskeletal:        General: Normal range of motion.     Cervical back: Normal range of motion and neck supple.  Skin:    General: Skin is warm.  Comments: Very small incision at gluteal cleft off to the left over portion of pilonidal abscess, minimal drainage present at this time.  Explored cavity with forceps, no remaining packing materials discovered.  No significant erythema, edema in the area  Neurological:     General: No focal deficit present.     Mental Status: He is oriented to person, place, and time.  Psychiatric:        Mood and Affect: Mood normal.        Thought Content: Thought content normal.        Judgment: Judgment normal.      UC Treatments / Results  Labs (all labs ordered are listed, but only abnormal results are displayed) Labs Reviewed - No data to display  EKG   Radiology No results found.  Procedures Procedures (including critical care time)  Medications Ordered in UC Medications - No data to display  Initial Impression / Assessment and Plan / UC Course  I have reviewed the triage vital signs and the nursing notes.  Pertinent labs & imaging results that were available during my care of the patient were reviewed by me and considered in my medical decision making (see chart for details).     Patient insistent that there is still packing material in the cavity, however on exam there is no evidence of residual packing material in the wound.  Per providers note 2 days ago, only about 2 inches of 1 strip of packing material was placed and then the packing was discontinued due to patient discomfort.  He states that a small piece came out in the shower so this should be the only packing material that was placed.  Continue course of antibiotics, close follow-up with general surgery.  40 minutes spent today in direct patient care, exploration of the wound and discussion regarding  plan  Final Clinical Impressions(s) / UC Diagnoses   Final diagnoses:  Pilonidal cyst   Discharge Instructions   None    ED Prescriptions   None    PDMP not reviewed this encounter.   Volney American, [place] 09/16/21 1534

## 2021-09-17 LAB — AEROBIC CULTURE W GRAM STAIN (SUPERFICIAL SPECIMEN): Gram Stain: NONE SEEN

## 2021-09-17 NOTE — Telephone Encounter (Signed)
PCP placed referral for Cardiology second opinion.   Thanks!

## 2021-09-23 ENCOUNTER — Encounter: Payer: Self-pay | Admitting: Family Medicine

## 2021-09-23 ENCOUNTER — Other Ambulatory Visit: Payer: Self-pay | Admitting: Family Medicine

## 2021-09-24 ENCOUNTER — Other Ambulatory Visit: Payer: Self-pay | Admitting: Family Medicine

## 2021-09-24 ENCOUNTER — Telehealth: Payer: Self-pay | Admitting: Emergency Medicine

## 2021-09-24 MED ORDER — METHOCARBAMOL 500 MG PO TABS: 500.0000 mg | ORAL_TABLET | Freq: Three times a day (TID) | ORAL | 3 refills | Status: DC | PRN

## 2021-09-24 MED ORDER — GABAPENTIN 400 MG PO CAPS
400.0000 mg | ORAL_CAPSULE | Freq: Two times a day (BID) | ORAL | 3 refills | Status: DC
Start: 2021-09-24 — End: 2022-03-26

## 2021-09-24 MED ORDER — DULOXETINE HCL 60 MG PO CPEP
60.0000 mg | ORAL_CAPSULE | Freq: Every day | ORAL | 3 refills | Status: DC
Start: 2021-09-24 — End: 2021-10-25

## 2021-09-24 NOTE — Telephone Encounter (Signed)
Copied from [place]. Topic: Referral - Question >> Sep 24, 2021 10:30 AM Erskine Squibb wrote: Reason for CRM: Dani with the pre service center called in stating patients insurance is requiring a prior authorization to get his MRI that is scheduled on Wednesday at [facility]

## 2021-09-24 NOTE — Telephone Encounter (Signed)
Pt is calling to check on the status of the PA. Pt is wanting to speak to the nurse. Pt states he can not wait until the 12th to receive a response. CB- [phone]

## 2021-09-26 ENCOUNTER — Telehealth: Payer: Self-pay | Admitting: *Deleted

## 2021-09-26 ENCOUNTER — Ambulatory Visit (HOSPITAL_COMMUNITY)
Admission: RE | Admit: 2021-09-26 | Discharge: 2021-09-26 | Disposition: A | Discharge status: Home / Self Care | Payer: 59 | Source: Ambulatory Visit | Attending: Family Medicine | Admitting: Family Medicine

## 2021-09-26 ENCOUNTER — Ambulatory Visit (HOSPITAL_COMMUNITY)
Admission: RE | Admit: 2021-09-26 | Discharge: 2021-09-26 | Disposition: A | Discharge status: Home / Self Care | Payer: 59 | Source: Ambulatory Visit | Attending: Neurology | Admitting: Neurology

## 2021-09-26 DIAGNOSIS — I639 Cerebral infarction, unspecified: Secondary | ICD-10-CM | POA: Insufficient documentation

## 2021-09-26 DIAGNOSIS — M5412 Radiculopathy, cervical region: Secondary | ICD-10-CM | POA: Diagnosis present

## 2021-09-26 DIAGNOSIS — R519 Headache, unspecified: Secondary | ICD-10-CM | POA: Diagnosis not present

## 2021-09-26 DIAGNOSIS — M542 Cervicalgia: Secondary | ICD-10-CM | POA: Diagnosis not present

## 2021-09-26 MED ORDER — GADOBUTROL 1 MMOL/ML IV SOLN
7.0000 mL | Freq: Once | INTRAVENOUS | Status: AC | PRN
  Administered 2021-09-26: 7 mL via INTRAVENOUS

## 2021-09-26 NOTE — Telephone Encounter (Signed)
'  Jimmy [name]' from [facility] states pt is there for scheduled MRI. States needs prior authorization. During call, Jimmy [name] stated "Looks like they are going ahead with MRI and PCP will just need to back date it." Assured NT would route to practice for PCPs review.

## 2021-09-27 ENCOUNTER — Other Ambulatory Visit: Payer: Self-pay | Admitting: Family Medicine

## 2021-09-27 ENCOUNTER — Encounter: Payer: Self-pay | Admitting: Family Medicine

## 2021-09-27 DIAGNOSIS — M5412 Radiculopathy, cervical region: Secondary | ICD-10-CM

## 2021-09-28 ENCOUNTER — Telehealth: Payer: Self-pay | Admitting: Family Medicine

## 2021-09-28 ENCOUNTER — Other Ambulatory Visit: Payer: Self-pay | Admitting: Family Medicine

## 2021-09-28 DIAGNOSIS — R29818 Other symptoms and signs involving the nervous system: Secondary | ICD-10-CM

## 2021-09-28 DIAGNOSIS — R55 Syncope and collapse: Secondary | ICD-10-CM

## 2021-09-28 NOTE — Telephone Encounter (Signed)
Pt called, brain and c-spine MRI results are available and he would like Dr. Clovis Riley opinion on them.  Also, pt mentioned he found a back xray  done in 2012 at [place] where he was diagnosed with DDD. Wants to know if Dr. Georgina Snell would order a back xray for him to follow up on this diagnosis.

## 2021-10-01 NOTE — Telephone Encounter (Signed)
Patient is scheduled for a phone visit on Wednesday.

## 2021-10-01 NOTE — Telephone Encounter (Signed)
Phone visit set up for 10/03/2021.

## 2021-10-02 NOTE — Progress Notes (Unsigned)
    Virtual Visit  via phone note   I connected with Jimmy [name] today by a phone enabled telemedicine application and verified that I am speaking with the correct person using two identifiers.  ? Location of the provider office ? Location of the patient Home ? The names and roles of all persons participating in the visit.  Patient and myself   I discussed the limitations, risks, security and privacy concerns of performing an evaluation and management service by telephone and the availability of in person appointments. I also discussed with the patient that there may be a patient responsible charge related to this service. The patient expressed understanding and agreed to proceed.    I discussed the limitations of evaluation and management by telemedicine and the availability of in person appointments. The patient expressed understanding and agreed to proceed.  History of Present Illness: Jimmy [name] is a 31 y.o. male who would like to discuss his brain and c-spine MRI results from 09/26/21.  He has continued left shoulder pain and pain radiating down his left arm.  Brain MRI was normal and MRI C-spine did not show any significant neural impingement left arm. Review of his medical information does show a mildly positive nerve conduction study from 2021 for carpal tunnel syndrome.     Assessment and Plan: 31 y.o. male with left arm paresthesias.  At this point the most likely explanation is combination of shoulder pain from the cumulative injury to his shoulder with his various shoulder injuries as well as probable carpal tunnel syndrome.  He does have a positive nerve conduction study from 2021.  Plan for night splints for carpal tunnel syndrome and recheck in 2 months.  Consider injection at some point.  PDMP not reviewed this encounter. No orders of the defined types were placed in this encounter.  No orders of the defined types were placed in this encounter.   Follow Up  Instructions:    I discussed the assessment and treatment plan with the patient. The patient was provided an opportunity to ask questions and all were answered. The patient agreed with the plan and demonstrated an understanding of the instructions.   The patient was advised to call back or seek an in-person evaluation if the symptoms worsen or if the condition fails to improve as anticipated.  Time: 11 minutes

## 2021-10-03 ENCOUNTER — Encounter: Payer: Self-pay | Admitting: Family Medicine

## 2021-10-03 ENCOUNTER — Ambulatory Visit (INDEPENDENT_AMBULATORY_CARE_PROVIDER_SITE_OTHER): Payer: 59 | Admitting: Family Medicine

## 2021-10-03 DIAGNOSIS — G5603 Carpal tunnel syndrome, bilateral upper limbs: Secondary | ICD-10-CM

## 2021-10-25 ENCOUNTER — Encounter: Payer: Self-pay | Admitting: Family Medicine

## 2021-10-25 ENCOUNTER — Other Ambulatory Visit: Payer: Self-pay | Admitting: Family Medicine

## 2021-10-25 DIAGNOSIS — F3161 Bipolar disorder, current episode mixed, mild: Secondary | ICD-10-CM

## 2021-10-25 DIAGNOSIS — G4709 Other insomnia: Secondary | ICD-10-CM

## 2021-10-25 MED ORDER — DULOXETINE HCL 60 MG PO CPEP: 60.0000 mg | ORAL_CAPSULE | Freq: Every day | ORAL | 6 refills | Status: DC

## 2021-10-25 MED ORDER — QUETIAPINE FUMARATE 100 MG PO TABS: 100.0000 mg | ORAL_TABLET | Freq: Every day | ORAL | 6 refills | Status: DC

## 2021-10-29 ENCOUNTER — Encounter: Payer: Self-pay | Admitting: Family Medicine

## 2021-11-09 ENCOUNTER — Ambulatory Visit (HOSPITAL_COMMUNITY): Payer: 59

## 2021-11-10 ENCOUNTER — Encounter: Payer: Self-pay | Admitting: Family Medicine

## 2021-11-10 DIAGNOSIS — R569 Unspecified convulsions: Secondary | ICD-10-CM | POA: Diagnosis not present

## 2021-11-10 DIAGNOSIS — R42 Dizziness and giddiness: Secondary | ICD-10-CM | POA: Diagnosis not present

## 2021-11-12 ENCOUNTER — Ambulatory Visit (INDEPENDENT_AMBULATORY_CARE_PROVIDER_SITE_OTHER): Payer: 59

## 2021-11-12 ENCOUNTER — Ambulatory Visit
Admission: EM | Admit: 2021-11-12 | Discharge: 2021-11-12 | Disposition: A | Discharge status: Home / Self Care | Payer: 59 | Attending: Nurse Practitioner | Admitting: Nurse Practitioner

## 2021-11-12 DIAGNOSIS — M542 Cervicalgia: Secondary | ICD-10-CM

## 2021-11-12 DIAGNOSIS — T148XXA Other injury of unspecified body region, initial encounter: Secondary | ICD-10-CM | POA: Diagnosis not present

## 2021-11-12 MED ORDER — IBUPROFEN 800 MG PO TABS: 800.0000 mg | ORAL_TABLET | Freq: Three times a day (TID) | ORAL | 0 refills | Status: DC | PRN
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
## 2021-11-12 NOTE — Discharge Instructions (Addendum)
-   The neck x-ray today is negative for any acute bony fractures - Pain you are having is likely muscular and should improved over the next week or so. -Continue ibuprofen 800 mg every 8 hours as needed for muscular pain. -In addition, you can also use warm compresses or cool compresses to help with pain -Recommend light stretching to help loosen muscles -Seek care if symptoms worsen
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
## 2021-11-14 ENCOUNTER — Ambulatory Visit: Payer: 59 | Attending: Family Medicine | Admitting: Neurology

## 2021-11-14 DIAGNOSIS — R55 Syncope and collapse: Secondary | ICD-10-CM | POA: Diagnosis not present

## 2021-11-14 DIAGNOSIS — R29818 Other symptoms and signs involving the nervous system: Secondary | ICD-10-CM | POA: Diagnosis not present

## 2021-11-14 DIAGNOSIS — G4733 Obstructive sleep apnea (adult) (pediatric): Secondary | ICD-10-CM | POA: Diagnosis not present

## 2021-11-15 ENCOUNTER — Emergency Department (HOSPITAL_COMMUNITY)
Admission: EM | Admit: 2021-11-15 | Discharge: 2021-11-15 | Disposition: A | Discharge status: Home / Self Care | Payer: 59 | Attending: Emergency Medicine | Admitting: Emergency Medicine

## 2021-11-15 ENCOUNTER — Emergency Department (HOSPITAL_COMMUNITY): Payer: 59

## 2021-11-15 ENCOUNTER — Encounter (HOSPITAL_COMMUNITY): Payer: Self-pay | Admitting: Emergency Medicine

## 2021-11-15 ENCOUNTER — Other Ambulatory Visit: Payer: Self-pay

## 2021-11-15 DIAGNOSIS — S161XXD Strain of muscle, fascia and tendon at neck level, subsequent encounter: Secondary | ICD-10-CM | POA: Diagnosis not present

## 2021-11-15 DIAGNOSIS — S169XXA Unspecified injury of muscle, fascia and tendon at neck level, initial encounter: Secondary | ICD-10-CM | POA: Diagnosis not present

## 2021-11-15 DIAGNOSIS — S161XXA Strain of muscle, fascia and tendon at neck level, initial encounter: Secondary | ICD-10-CM | POA: Diagnosis not present

## 2021-11-15 DIAGNOSIS — X58XXXA Exposure to other specified factors, initial encounter: Secondary | ICD-10-CM | POA: Insufficient documentation

## 2021-11-15 DIAGNOSIS — D72829 Elevated white blood cell count, unspecified: Secondary | ICD-10-CM | POA: Insufficient documentation

## 2021-11-15 DIAGNOSIS — M542 Cervicalgia: Secondary | ICD-10-CM | POA: Diagnosis not present

## 2021-11-15 LAB — CBC WITH DIFFERENTIAL/PLATELET
Abs Immature Granulocytes: 0.07 10*3/uL (ref 0.00–0.07)
Basophils Absolute: 0.1 10*3/uL (ref 0.0–0.1)
Basophils Relative: 0 %
Eosinophils Absolute: 0.1 10*3/uL (ref 0.0–0.5)
Eosinophils Relative: 0 %
HCT: 42.3 % (ref 39.0–52.0)
Hemoglobin: 14.2 g/dL (ref 13.0–17.0)
Immature Granulocytes: 1 %
Lymphocytes Relative: 14 %
Lymphs Abs: 2.1 10*3/uL (ref 0.7–4.0)
MCH: 30.6 pg (ref 26.0–34.0)
MCHC: 33.6 g/dL (ref 30.0–36.0)
MCV: 91.2 fL (ref 80.0–100.0)
Monocytes Absolute: 0.8 10*3/uL (ref 0.1–1.0)
Monocytes Relative: 5 %
Neutro Abs: 11.8 10*3/uL — ABNORMAL HIGH (ref 1.7–7.7)
Neutrophils Relative %: 80 %
Platelets: 268 10*3/uL (ref 150–400)
RBC: 4.64 MIL/uL (ref 4.22–5.81)
RDW: 13 % (ref 11.5–15.5)
WBC: 14.9 10*3/uL — ABNORMAL HIGH (ref 4.0–10.5)
nRBC: 0 % (ref 0.0–0.2)

## 2021-11-15 LAB — BASIC METABOLIC PANEL
Anion gap: 4 — ABNORMAL LOW (ref 5–15)
BUN: 13 mg/dL (ref 6–20)
CO2: 25 mmol/L (ref 22–32)
Calcium: 9.2 mg/dL (ref 8.9–10.3)
Chloride: 110 mmol/L (ref 98–111)
Creatinine, Ser: 0.69 mg/dL (ref 0.61–1.24)
GFR, Estimated: >60 mL/min (ref >60)
Glucose, Bld: 107 mg/dL — ABNORMAL HIGH (ref 70–99)
Potassium: 4.7 mmol/L (ref 3.5–5.1)
Sodium: 139 mmol/L (ref 135–145)

## 2021-11-15 MED ORDER — IOHEXOL 350 MG/ML SOLN
75.0000 mL | Freq: Once | INTRAVENOUS | Status: AC | PRN
  Administered 2021-11-15: 75 mL via INTRAVENOUS

## 2021-11-15 MED ORDER — OXYCODONE-ACETAMINOPHEN 5-325 MG PO TABS: 2.0000 | ORAL_TABLET | Freq: Once | ORAL | Status: DC

## 2021-11-15 MED ORDER — DIAZEPAM 5 MG PO TABS
5.0000 mg | ORAL_TABLET | Freq: Once | ORAL | Status: AC
  Administered 2021-11-15: 5 mg via ORAL
  Filled 2021-11-15: qty 1
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
## 2021-11-15 NOTE — Discharge Instructions (Signed)
This is likely a muscular spasm.  Please increase your Robaxin to 1000 mg twice daily for the next 7 days.  In addition, take your gabapentin 3 times per day for the next 7 days.  After 7 days return to your normal dose of the 2 medications.  Follow-up with your primary care doctor.  Return to the emergency room if any worsening symptoms.
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
## 2021-11-17 NOTE — Procedures (Signed)
  [place] A. Merlene Laughter, MD     [email or web address]               HOME SLEEP STUDY  LOCATION: [place]   Patient Name: Jimmy [name], Jimmy [name] Date: 11/14/2021 Gender: Male D.O.B: [birth year] Age (years): 31 Referring Provider: Arnoldo Morale Height (inches): 64 Interpreting Physician: Phillips Odor MD, ABSM Weight (lbs): 171 RPSGT: Rosebud Poles BMI: 29 MRN: [id number] Neck Size: CLINICAL INFORMATION Sleep Study Type: HST     Indication for sleep study: N/A     Epworth Sleepiness Score: N/A  SLEEP STUDY TECHNIQUE A multi-channel overnight portable sleep study was performed. The channels recorded were: nasal airflow, thoracic respiratory movement, and oxygen saturation with a pulse oximetry. Snoring was also monitored.  MEDICATIONS Patient self administered medications include: N/A.  Current Outpatient Medications:    AIMOVIG 140 MG/ML SOAJ, Inject into the skin every 30 (thirty) days., Disp: , Rfl:    DULoxetine (CYMBALTA) 60 MG capsule, Take 1 capsule (60 mg total) by mouth daily in the afternoon., Disp: 30 capsule, Rfl: 6   gabapentin (NEURONTIN) 400 MG capsule, Take 1 capsule (400 mg total) by mouth 2 (two) times daily., Disp: 60 capsule, Rfl: 3   methocarbamol (ROBAXIN) 500 MG tablet, Take 2 tablets (1,000 mg total) by mouth 2 (two) times daily., Disp: 120 tablet, Rfl: 1   QUEtiapine (SEROQUEL) 100 MG tablet, Take 1 tablet (100 mg total) by mouth at bedtime., Disp: 30 tablet, Rfl: 6  Current Facility-Administered Medications:    triamcinolone acetonide (KENALOG) 10 MG/ML injection 20 mg, 20 mg, Other, Once, Sheffield, Jefferson R, PA-C   SLEEP ARCHITECTURE Patient was studied for 451 minutes. The sleep efficiency was 94.0 % and the patient was supine for 0%. The arousal index was 0.0 per hour.  RESPIRATORY PARAMETERS The overall AHI was 3.9 per hour, with a central apnea index of 0 per hour.  The oxygen nadir was 86% during sleep.      CARDIAC DATA Mean heart rate during sleep was 64.5 bpm.  IMPRESSIONS No significant obstructive sleep apnea occurred during this study (AHI = 3.9/h).  Delano Metz, MD Diplomate, American Board of Sleep Medicine.   ELECTRONICALLY SIGNED ON:  11/17/2021, 9:22 AM [place] PH: [phone]   FX: [phone] [place]

## 2021-11-22 ENCOUNTER — Other Ambulatory Visit: Payer: Self-pay | Admitting: Family Medicine

## 2021-11-22 ENCOUNTER — Encounter: Payer: Self-pay | Admitting: Family Medicine

## 2021-11-22 DIAGNOSIS — R569 Unspecified convulsions: Secondary | ICD-10-CM
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
## 2021-11-28 ENCOUNTER — Ambulatory Visit: Payer: 59 | Admitting: Neurology

## 2021-11-29 ENCOUNTER — Ambulatory Visit: Payer: Self-pay
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
## 2021-11-30 NOTE — Telephone Encounter (Signed)
Will need an eval. Last OV was June, 2023 for sleep concerns. Patient has an appt already scheduled 12/03/2021.

## 2021-12-03 ENCOUNTER — Encounter: Payer: Self-pay | Admitting: Nurse Practitioner

## 2021-12-03 ENCOUNTER — Ambulatory Visit: Payer: 59 | Attending: Nurse Practitioner | Admitting: Nurse Practitioner

## 2021-12-03 VITALS — BP 132/79 | HR 78 | Ht 64.0 in | Wt 168.0 lb

## 2021-12-03 DIAGNOSIS — M25511 Pain in right shoulder: Secondary | ICD-10-CM | POA: Diagnosis not present

## 2021-12-03 DIAGNOSIS — M5412 Radiculopathy, cervical region: Secondary | ICD-10-CM

## 2021-12-03 MED ORDER — METHOCARBAMOL 500 MG PO TABS
1000.0000 mg | ORAL_TABLET | Freq: Two times a day (BID) | ORAL | 1 refills | Status: DC
Start: 2021-12-03 — End: 2022-03-26

## 2021-12-03 MED ORDER — METHOCARBAMOL 500 MG PO TABS: 1000.0000 mg | ORAL_TABLET | Freq: Two times a day (BID) | ORAL | 2 refills | Status: DC
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
## 2021-12-11 ENCOUNTER — Ambulatory Visit: Payer: 59 | Admitting: Neurology

## 2021-12-11 ENCOUNTER — Encounter: Payer: Self-pay | Admitting: Neurology

## 2021-12-12 ENCOUNTER — Encounter: Payer: Self-pay | Admitting: Neurology

## 2021-12-13 DIAGNOSIS — Z029 Encounter for administrative examinations, unspecified: Secondary | ICD-10-CM

## 2021-12-14 ENCOUNTER — Ambulatory Visit (HOSPITAL_COMMUNITY)
Admission: RE | Admit: 2021-12-14 | Discharge: 2021-12-14 | Disposition: A | Discharge status: Home / Self Care | Payer: 59 | Source: Ambulatory Visit | Attending: Neurology | Admitting: Neurology

## 2021-12-14 ENCOUNTER — Telehealth: Payer: Self-pay | Admitting: Neurology

## 2021-12-14 DIAGNOSIS — R569 Unspecified convulsions: Secondary | ICD-10-CM | POA: Diagnosis not present

## 2021-12-14 DIAGNOSIS — Z79899 Other long term (current) drug therapy: Secondary | ICD-10-CM | POA: Insufficient documentation

## 2021-12-14 NOTE — Telephone Encounter (Signed)
Patient dismissed from [facility] by Metta Clines, DO, effective 12/12/21. Dismissal Letter sent out by 1st class mail. [employer]

## 2021-12-14 NOTE — Progress Notes (Cosign Needed)
Outpatient EEG complete - results pending.  

## 2021-12-17 NOTE — Procedures (Signed)
  [place] A. Merlene Laughter, MD     [email or web address]           HISTORY: The patient is a 31 year old male who has a history of seizures.  MEDICATIONS:  Current Outpatient Medications:    AIMOVIG 140 MG/ML SOAJ, Inject into the skin every 30 (thirty) days., Disp: , Rfl:    DULoxetine (CYMBALTA) 60 MG capsule, Take 1 capsule (60 mg total) by mouth daily in the afternoon., Disp: 30 capsule, Rfl: 6   gabapentin (NEURONTIN) 400 MG capsule, Take 1 capsule (400 mg total) by mouth 2 (two) times daily., Disp: 60 capsule, Rfl: 3   methocarbamol (ROBAXIN) 500 MG tablet, Take 2 tablets (1,000 mg total) by mouth 2 (two) times daily., Disp: 120 tablet, Rfl: 1   QUEtiapine (SEROQUEL) 100 MG tablet, Take 1 tablet (100 mg total) by mouth at bedtime., Disp: 30 tablet, Rfl: 6  Current Facility-Administered Medications:    triamcinolone acetonide (KENALOG) 10 MG/ML injection 20 mg, 20 mg, Other, Once, Sheffield, Kelli R, PA-C      ANALYSIS: A 16 channel recording using standard 10 20 measurements is conducted for 32 minutes. There is a well-formed posterior dominant rhythm of 11-12 hertz which attenuates with eye opening. There is beta activity observed in the frontal areas.  Awake and drowsy architecture are observed. Photic stimulation is carried out without abnormal changes in the background activity.  There is no focal or lateral slowing. There is no epileptiform activities noted.   IMPRESSION: 1. This is a normal recording of awake and drowsy states.      Elen Acero A. Merlene Laughter, M.D.  Diplomate, Tax adviser of Psychiatry and Neurology ( Neurology).

## 2021-12-18 ENCOUNTER — Ambulatory Visit (INDEPENDENT_AMBULATORY_CARE_PROVIDER_SITE_OTHER): Payer: 59 | Admitting: Family Medicine

## 2021-12-18 ENCOUNTER — Ambulatory Visit: Payer: Self-pay

## 2021-12-18 VITALS — BP 174/88 | HR 76 | Ht 64.0 in | Wt 165.0 lb

## 2021-12-18 DIAGNOSIS — M25511 Pain in right shoulder: Secondary | ICD-10-CM | POA: Diagnosis not present

## 2021-12-18 DIAGNOSIS — M542 Cervicalgia: Secondary | ICD-10-CM | POA: Diagnosis not present

## 2021-12-18 NOTE — Patient Instructions (Addendum)
Thank you for coming in today.   I've referred you to Physical Therapy.  Let us know if you don't hear from them in one week.   Continue current medicines.   Try heating pad.   Recheck once PT is done especially if you still hurt.
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
## 2021-12-19 NOTE — Addendum Note (Signed)
Addended by: Judy Pimple R on: 12/19/2021 12:59 PM   Modules accepted: Orders

## 2021-12-25 DIAGNOSIS — L988 Other specified disorders of the skin and subcutaneous tissue: Secondary | ICD-10-CM | POA: Diagnosis not present

## 2021-12-27 ENCOUNTER — Encounter (HOSPITAL_BASED_OUTPATIENT_CLINIC_OR_DEPARTMENT_OTHER): Payer: Self-pay | Admitting: Surgery

## 2021-12-27 ENCOUNTER — Other Ambulatory Visit: Payer: Self-pay

## 2021-12-27 NOTE — Progress Notes (Signed)
   12/27/21 1154  PAT Phone Screen  Do You Have Diabetes? No  Do You Have Hypertension? No  Have You Ever Been to the ER for Asthma? No  Have You Taken Oral Steroids in the Past 3 Months? No  Do you Take Phenteramine or any Other Diet Drugs? No  Recent  Lab Work, EKG, CXR? Yes  Where was this test performed? EKG NSR 08/30/21 OV with Dr Farris Has for evaluation following a syncopal episode. Normal Echo EF 60-65%. Not felt to be related to cardiac factors, referred to Neuro. Pt underwent an EEG 12/14/21 WNL and had a negative sleep study 11/14/21. Reviewed with Dr Smith Robert- ok to proceed with surgery on 01/04/22.  Do you have a history of heart problems? No  Any Recent Hospitalizations? No  Height '5\' 4"'$  (1.626 m)  Weight 74.8 kg  Pat Appointment Scheduled No  Reason for No Appointment Not Needed

## 2022-01-02 ENCOUNTER — Ambulatory Visit: Payer: Self-pay | Admitting: Surgery

## 2022-01-04 ENCOUNTER — Encounter (HOSPITAL_BASED_OUTPATIENT_CLINIC_OR_DEPARTMENT_OTHER): Payer: Self-pay | Admitting: Surgery

## 2022-01-04 ENCOUNTER — Ambulatory Visit (HOSPITAL_BASED_OUTPATIENT_CLINIC_OR_DEPARTMENT_OTHER)
Admission: RE | Admit: 2022-01-04 | Discharge: 2022-01-04 | Disposition: A | Discharge status: Home / Self Care | Payer: 59 | Source: Ambulatory Visit | Attending: Surgery | Admitting: Surgery

## 2022-01-04 ENCOUNTER — Other Ambulatory Visit: Payer: Self-pay

## 2022-01-04 ENCOUNTER — Encounter (HOSPITAL_BASED_OUTPATIENT_CLINIC_OR_DEPARTMENT_OTHER)
Admission: RE | Disposition: A | Discharge status: Home / Self Care | Payer: Self-pay | Source: Ambulatory Visit | Attending: Surgery

## 2022-01-04 ENCOUNTER — Ambulatory Visit (HOSPITAL_BASED_OUTPATIENT_CLINIC_OR_DEPARTMENT_OTHER): Payer: 59 | Admitting: Certified Registered"

## 2022-01-04 DIAGNOSIS — L0591 Pilonidal cyst without abscess: Secondary | ICD-10-CM

## 2022-01-04 DIAGNOSIS — M1990 Unspecified osteoarthritis, unspecified site: Secondary | ICD-10-CM | POA: Insufficient documentation

## 2022-01-04 DIAGNOSIS — R69 Illness, unspecified: Secondary | ICD-10-CM | POA: Diagnosis not present

## 2022-01-04 DIAGNOSIS — F17210 Nicotine dependence, cigarettes, uncomplicated: Secondary | ICD-10-CM | POA: Insufficient documentation

## 2022-01-04 DIAGNOSIS — K219 Gastro-esophageal reflux disease without esophagitis: Secondary | ICD-10-CM | POA: Insufficient documentation

## 2022-01-04 DIAGNOSIS — F419 Anxiety disorder, unspecified: Secondary | ICD-10-CM | POA: Insufficient documentation

## 2022-01-04 DIAGNOSIS — R569 Unspecified convulsions: Secondary | ICD-10-CM | POA: Diagnosis not present

## 2022-01-04 HISTORY — PX: PILONIDAL CYST EXCISION: SHX744

## 2022-01-04 SURGERY — EXCISION, SIMPLE PILONIDAL CYST
Anesthesia: General | Site: Buttocks

## 2022-01-04 MED ORDER — ONDANSETRON HCL 4 MG/2ML IJ SOLN
INTRAMUSCULAR | Status: DC | PRN
  Administered 2022-01-04: 4 mg via INTRAVENOUS

## 2022-01-04 MED ORDER — OXYCODONE HCL 5 MG PO TABS
ORAL_TABLET | ORAL | Status: AC
  Filled 2022-01-04: qty 1

## 2022-01-04 MED ORDER — PROPOFOL 10 MG/ML IV BOLUS
INTRAVENOUS | Status: DC | PRN
  Administered 2022-01-04: 200 mg via INTRAVENOUS

## 2022-01-04 MED ORDER — ACETAMINOPHEN 10 MG/ML IV SOLN
INTRAVENOUS | Status: DC | PRN
  Administered 2022-01-04: 1000 mg via INTRAVENOUS

## 2022-01-04 MED ORDER — OXYCODONE HCL 5 MG/5ML PO SOLN: 5.0000 mg | Freq: Once | ORAL | Status: AC | PRN

## 2022-01-04 MED ORDER — KETOROLAC TROMETHAMINE 30 MG/ML IJ SOLN
INTRAMUSCULAR | Status: AC
  Filled 2022-01-04: qty 1

## 2022-01-04 MED ORDER — FENTANYL CITRATE (PF) 100 MCG/2ML IJ SOLN
INTRAMUSCULAR | Status: DC | PRN
  Administered 2022-01-04 (×4): 50 ug via INTRAVENOUS

## 2022-01-04 MED ORDER — BUPIVACAINE LIPOSOME 1.3 % IJ SUSP
INTRAMUSCULAR | Status: DC | PRN
  Administered 2022-01-04: 20 mL

## 2022-01-04 MED ORDER — LIDOCAINE 2% (20 MG/ML) 5 ML SYRINGE
INTRAMUSCULAR | Status: AC
  Filled 2022-01-04: qty 5

## 2022-01-04 MED ORDER — BUPIVACAINE-EPINEPHRINE (PF) 0.25% -1:200000 IJ SOLN
INTRAMUSCULAR | Status: AC
  Filled 2022-01-04: qty 30

## 2022-01-04 MED ORDER — ONDANSETRON HCL 4 MG/2ML IJ SOLN
INTRAMUSCULAR | Status: AC
  Filled 2022-01-04: qty 2

## 2022-01-04 MED ORDER — DEXAMETHASONE SODIUM PHOSPHATE 10 MG/ML IJ SOLN
INTRAMUSCULAR | Status: AC
  Filled 2022-01-04: qty 1

## 2022-01-04 MED ORDER — "GAUZE DRESSING 4""X4"" PADS": 4.0000 | MEDICATED_PAD | Freq: Every day | 1 refills | Status: DC

## 2022-01-04 MED ORDER — CEFAZOLIN SODIUM-DEXTROSE 2-4 GM/100ML-% IV SOLN
2.0000 g | INTRAVENOUS | Status: AC
  Administered 2022-01-04: 2 g via INTRAVENOUS

## 2022-01-04 MED ORDER — KETOROLAC TROMETHAMINE 30 MG/ML IJ SOLN
INTRAMUSCULAR | Status: DC | PRN
  Administered 2022-01-04: 30 mg via INTRAVENOUS

## 2022-01-04 MED ORDER — OXYCODONE HCL 5 MG PO TABS
5.0000 mg | ORAL_TABLET | Freq: Once | ORAL | Status: AC | PRN
  Administered 2022-01-04: 5 mg via ORAL

## 2022-01-04 MED ORDER — PROMETHAZINE HCL 25 MG/ML IJ SOLN: 6.2500 mg | INTRAMUSCULAR | Status: DC | PRN

## 2022-01-04 MED ORDER — MEPERIDINE HCL 25 MG/ML IJ SOLN: 6.2500 mg | INTRAMUSCULAR | Status: DC | PRN

## 2022-01-04 MED ORDER — FENTANYL CITRATE (PF) 100 MCG/2ML IJ SOLN
INTRAMUSCULAR | Status: AC
  Filled 2022-01-04: qty 2

## 2022-01-04 MED ORDER — ROCURONIUM BROMIDE 100 MG/10ML IV SOLN
INTRAVENOUS | Status: DC | PRN
  Administered 2022-01-04: 60 mg via INTRAVENOUS

## 2022-01-04 MED ORDER — PROPOFOL 10 MG/ML IV BOLUS
INTRAVENOUS | Status: AC
  Filled 2022-01-04: qty 20

## 2022-01-04 MED ORDER — 0.9 % SODIUM CHLORIDE (POUR BTL) OPTIME
TOPICAL | Status: DC | PRN
  Administered 2022-01-04: 50 mL

## 2022-01-04 MED ORDER — HYDROCODONE-ACETAMINOPHEN 5-325 MG PO TABS: 1.0000 | ORAL_TABLET | Freq: Three times a day (TID) | ORAL | 0 refills | Status: AC | PRN

## 2022-01-04 MED ORDER — MIDAZOLAM HCL 2 MG/2ML IJ SOLN
INTRAMUSCULAR | Status: AC
  Filled 2022-01-04: qty 2

## 2022-01-04 MED ORDER — LACTATED RINGERS IV SOLN: INTRAVENOUS | Status: DC

## 2022-01-04 MED ORDER — DEXMEDETOMIDINE HCL IN NACL 80 MCG/20ML IV SOLN
INTRAVENOUS | Status: DC | PRN
  Administered 2022-01-04: 4 ug via BUCCAL
  Administered 2022-01-04 (×2): 8 ug via BUCCAL

## 2022-01-04 MED ORDER — HYDROMORPHONE HCL 1 MG/ML IJ SOLN: 0.2500 mg | INTRAMUSCULAR | Status: DC | PRN

## 2022-01-04 MED ORDER — SUGAMMADEX SODIUM 200 MG/2ML IV SOLN
INTRAVENOUS | Status: DC | PRN
  Administered 2022-01-04: 298.4 mg via INTRAVENOUS

## 2022-01-04 MED ORDER — BUPIVACAINE LIPOSOME 1.3 % IJ SUSP
INTRAMUSCULAR | Status: AC
  Filled 2022-01-04: qty 20

## 2022-01-04 MED ORDER — MIDAZOLAM HCL 5 MG/5ML IJ SOLN
INTRAMUSCULAR | Status: DC | PRN
  Administered 2022-01-04: 2 mg via INTRAVENOUS

## 2022-01-04 MED ORDER — LIDOCAINE HCL (CARDIAC) PF 100 MG/5ML IV SOSY
PREFILLED_SYRINGE | INTRAVENOUS | Status: DC | PRN
  Administered 2022-01-04: 60 mg via INTRAVENOUS

## 2022-01-04 MED ORDER — CEFAZOLIN SODIUM-DEXTROSE 2-4 GM/100ML-% IV SOLN
INTRAVENOUS | Status: AC
  Filled 2022-01-04: qty 100

## 2022-01-04 MED ORDER — AMISULPRIDE (ANTIEMETIC) 5 MG/2ML IV SOLN: 10.0000 mg | Freq: Once | INTRAVENOUS | Status: DC | PRN

## 2022-01-04 MED ORDER — LIDOCAINE HCL (PF) 1 % IJ SOLN
INTRAMUSCULAR | Status: AC
  Filled 2022-01-04: qty 30

## 2022-01-04 MED ORDER — ACETAMINOPHEN 10 MG/ML IV SOLN
INTRAVENOUS | Status: AC
  Filled 2022-01-04: qty 100

## 2022-01-04 MED ORDER — DEXAMETHASONE SODIUM PHOSPHATE 4 MG/ML IJ SOLN
INTRAMUSCULAR | Status: DC | PRN
  Administered 2022-01-04: 10 mg via INTRAVENOUS

## 2022-01-04 SURGICAL SUPPLY — 45 items
APL SKNCLS STERI-STRIP NONHPOA (GAUZE/BANDAGES/DRESSINGS) ×1
BENZOIN TINCTURE PRP APPL 2/3 (GAUZE/BANDAGES/DRESSINGS) ×1 IMPLANT
BLADE SURG 10 STRL SS (BLADE) IMPLANT
BLADE SURG 15 STRL LF DISP TIS (BLADE) ×1 IMPLANT
BLADE SURG 15 STRL SS (BLADE) ×1
BRIEF MESH DISP LRG (UNDERPADS AND DIAPERS) ×1 IMPLANT
COVER BACK TABLE 60X90IN (DRAPES) ×1 IMPLANT
COVER MAYO STAND STRL (DRAPES) ×1 IMPLANT
DRAIN PENROSE 12X.25 LTX STRL (MISCELLANEOUS) ×1 IMPLANT
DRAPE LAPAROTOMY 100X72 PEDS (DRAPES) ×1 IMPLANT
DRAPE UTILITY XL STRL (DRAPES) ×1 IMPLANT
ELECT BLADE 4.0 EZ CLEAN MEGAD (MISCELLANEOUS) ×1
ELECT BLADE 6.5 EXT (BLADE) ×1 IMPLANT
ELECT BLADE TIP CTD 4 INCH (ELECTRODE) IMPLANT
ELECT NDL BLADE 2-5/6 (NEEDLE) ×1 IMPLANT
ELECT NEEDLE BLADE 2-5/6 (NEEDLE) ×1 IMPLANT
ELECT REM PT RETURN 9FT ADLT (ELECTROSURGICAL) ×1
ELECTRODE BLDE 4.0 EZ CLN MEGD (MISCELLANEOUS) ×1 IMPLANT
ELECTRODE REM PT RTRN 9FT ADLT (ELECTROSURGICAL) ×1 IMPLANT
GAUZE 4X4 16PLY ~~LOC~~+RFID DBL (SPONGE) ×1 IMPLANT
GAUZE PAD ABD 8X10 STRL (GAUZE/BANDAGES/DRESSINGS) ×1 IMPLANT
GAUZE SPONGE 4X4 12PLY STRL (GAUZE/BANDAGES/DRESSINGS) ×1 IMPLANT
GAUZE VASELINE 3X9 (GAUZE/BANDAGES/DRESSINGS) IMPLANT
GLOVE BIO SURGEON STRL SZ 6 (GLOVE) ×1 IMPLANT
GLOVE BIOGEL PI IND STRL 6 (GLOVE) ×1 IMPLANT
GLOVE BIOGEL PI IND STRL 6.5 (GLOVE) ×1 IMPLANT
GLOVE SURG POLY MICRO LF SZ5.5 (GLOVE) ×1 IMPLANT
GOWN STRL REUS W/TWL LRG LVL3 (GOWN DISPOSABLE) ×1 IMPLANT
KIT TURNOVER KIT B (KITS) ×1 IMPLANT
NDL HYPO 25X1 1.5 SAFETY (NEEDLE) ×1 IMPLANT
NDL SAFETY ECLIP 18X1.5 (MISCELLANEOUS) IMPLANT
NEEDLE HYPO 25X1 1.5 SAFETY (NEEDLE) ×1 IMPLANT
NS IRRIG 500ML POUR BTL (IV SOLUTION) ×1 IMPLANT
PACK BASIN DAY SURGERY FS (CUSTOM PROCEDURE TRAY) ×1 IMPLANT
PAD ARMBOARD 7.5X6 YLW CONV (MISCELLANEOUS) IMPLANT
PENCIL SMOKE EVACUATOR (MISCELLANEOUS) ×1 IMPLANT
SPONGE SURGIFOAM ABS GEL 12-7 (HEMOSTASIS) IMPLANT
SUT CHROMIC 3 0 PS 2 (SUTURE) IMPLANT
SUT CHROMIC 3 0 SH 27 (SUTURE) ×1 IMPLANT
SUT VIC AB 2-0 SH 27 (SUTURE)
SUT VIC AB 2-0 SH 27XBRD (SUTURE) IMPLANT
SYR CONTROL 10ML LL (SYRINGE) ×1 IMPLANT
TRAY DSU PREP LF (CUSTOM PROCEDURE TRAY) ×1 IMPLANT
TUBE CONNECTING 20X1/4 (TUBING) ×1 IMPLANT
YANKAUER SUCT BULB TIP NO VENT (SUCTIONS) ×1 IMPLANT

## 2022-01-04 NOTE — Anesthesia Procedure Notes (Signed)
Procedure Name: Intubation Date/Time: 01/04/2022 9:26 AM  Performed by: Blocker, Ernesta Amble, CRNAPre-anesthesia Checklist: Patient identified, Emergency Drugs available, Suction available and Patient being monitored Patient Re-evaluated:Patient Re-evaluated prior to induction Oxygen Delivery Method: Circle System Utilized Preoxygenation: Pre-oxygenation with 100% oxygen Induction Type: IV induction Ventilation: Mask ventilation without difficulty Laryngoscope Size: Mac and 3 Tube type: Oral Tube size: 7.0 mm Number of attempts: 1 Airway Equipment and Method: Stylet and Oral airway Placement Confirmation: ETT inserted through vocal cords under direct vision, positive ETCO2 and breath sounds checked- equal and bilateral Tube secured with: Tape Dental Injury: Teeth and Oropharynx as per pre-operative assessment

## 2022-01-04 NOTE — Anesthesia Postprocedure Evaluation (Signed)
Anesthesia Post Note  Patient: Jimmy [name]  Procedure(s) Performed: TREPHINATION OF PILONIDAL CYST (Buttocks)     Patient location during evaluation: PACU Anesthesia Type: General Level of consciousness: sedated and patient cooperative Pain management: pain level controlled Vital Signs Assessment: post-procedure vital signs reviewed and stable Respiratory status: spontaneous breathing Cardiovascular status: stable Anesthetic complications: no   No notable events documented.  Last Vitals:  Vitals:   01/04/22 1030 01/04/22 1045  BP: 117/79 115/71  Pulse: 77 74  Resp: 19 16  Temp:  36.7 C  SpO2: 100% 99%    Last Pain:  Vitals:   01/04/22 1045  TempSrc:   PainSc: [place]

## 2022-01-04 NOTE — Anesthesia Preprocedure Evaluation (Addendum)
Anesthesia Evaluation  Patient identified by MRN, date of birth, ID band Patient awake    Reviewed: Allergy & Precautions, NPO status , Patient's Chart, lab work & pertinent test results  Airway Mallampati: II  TM Distance: >3 FB Neck ROM: Full    Dental  (+) Dental Advisory Given, Chipped,    Pulmonary Current Smoker and Patient abstained from smoking.,    Pulmonary exam normal breath sounds clear to auscultation       Cardiovascular negative cardio ROS Normal cardiovascular exam Rhythm:Regular Rate:Normal     Neuro/Psych  Headaches, Seizures -,  PSYCHIATRIC DISORDERS Anxiety    GI/Hepatic GERD  ,(+)     substance abuse  ,   Endo/Other  negative endocrine ROS  Renal/GU negative Renal ROS     Musculoskeletal  (+) Arthritis , left biceps tear   Abdominal   Peds  (+) ADHD Hematology negative hematology ROS (+)   Anesthesia Other Findings Day of surgery medications reviewed with the patient.  Reproductive/Obstetrics                           Anesthesia Physical  Anesthesia Plan  ASA: 2  Anesthesia Plan: General   Post-op Pain Management:  Regional for Post-op pain and Toradol IV (intra-op)* and Ofirmev IV (intra-op)*   Induction: Intravenous  PONV Risk Score and Plan: 2 and Midazolam, Dexamethasone, Ondansetron and Treatment may vary due to age or medical condition  Airway Management Planned: Oral ETT  Additional Equipment:   Intra-op Plan:   Post-operative Plan: Extubation in OR  Informed Consent: I have reviewed the patients History and Physical, chart, labs and discussed the procedure including the risks, benefits and alternatives for the proposed anesthesia with the patient or authorized representative who has indicated his/her understanding and acceptance.     Dental advisory given  Plan Discussed with: CRNA  Anesthesia Plan Comments:         Anesthesia Quick  Evaluation

## 2022-01-04 NOTE — Transfer of Care (Signed)
Immediate Anesthesia Transfer of Care Note  Patient: Jimmy [name]  Procedure(s) Performed: TREPHINATION OF PILONIDAL CYST (Buttocks)  Patient Location: PACU  Anesthesia Type:General  Level of Consciousness: drowsy  Airway & Oxygen Therapy: Patient Spontanous Breathing and Patient connected to face mask oxygen  Post-op Assessment: Report given to RN and Post -op Vital signs reviewed and stable  Post vital signs: Reviewed and stable  Last Vitals:  Vitals Value Taken Time  BP 117/67 01/04/22 1005  Temp    Pulse 80 01/04/22 1011  Resp 15 01/04/22 1011  SpO2 99 % 01/04/22 1011  Vitals shown include unvalidated device data.  Last Pain:  Vitals:   01/04/22 0815  TempSrc: Oral  PainSc: 6          Complications: No notable events documented.

## 2022-01-04 NOTE — Op Note (Signed)
Date: 01/04/22  Patient: Jimmy [name] MRN: [id number]  Preoperative Diagnosis: Pilonidal disease Postoperative Diagnosis: Same  Procedure: Trephination of pilonidal cyst  Surgeon: Michaelle Birks, MD Assistant: Denton Lank, MD (Resident)  EBL: Minimal  Anesthesia: General  Specimens: None  Indications: [name] is a 31 yo male who was referred with pilonidal disease. He has had intermittent flares every 2-3 months, with pain and drained. He recently had an I&D of an abscess. After a discussion of the risks and benefits of surgery, he agreed to proceed.  Findings: Multiple pits in the gluteal cleft consistent with pilonidal sinuses, no signs of infection or acute inflammation.  Procedure details: Informed consent was obtained in the preoperative area prior to the procedure. The patient was brought to the operating room, general anesthesia was induced and appropriate lines and drains were placed for intraoperative monitoring. The patient was placed in the prone position. Perioperative antibiotics were administered per SCIP guidelines. The gluteal area was clipped, prepped and draped in the usual sterile fashion. A pre-procedure timeout was taken verifying patient identity, surgical site and procedure to be performed.  There were two small pits in the superior gluteal cleft, with no erythema or induration. There was a very small scar on the left gluteus, and on probing with a lacrimal duct probe this appeared to communicate with the cyst at midline. A 36m punch biopsy was used to punch out the skin at the two sinus openings at midline. The opening at the more inferior sinus was extended superiorly slightly using cautery. There was no debris in the underlying tissue. The cavity was curretted and irrigated with a diluted hydrogen peroxide solution. The wound was infiltrated with Exparel. A clean gauze dressing was applied.  The patient tolerated the procedure well with no apparent  complications. All counts were correct x2 at the end of the procedure. The patient was extubated and taken to PACU in stable condition.  SMichaelle Birks MD 01/04/22 10:19 AM

## 2022-01-04 NOTE — Discharge Instructions (Addendum)
[facility] DISCHARGE INSTRUCTIONS   Activity Do not drive while taking narcotic pain medication. Do not drive the day of surgery. You may otherwise resume your usual activities as tolerated.  Pain It is normal to have some pain and swelling at your incisions. Take Tylenol and ibuprofen for pain. If you have more severe pain that is not controlled with these medications, you have been prescribed Norco (hydrocodone-acetaminophen). Take this only as needed for the most severe pain. Norco has '325mg'$  of Tylenol in each tablet - do NOT take any more than '4000mg'$  total of Tylenol in a 24-hour period.   Wound Care Keep clean, dry gauze over your wounds. Change the gauze at least every day, and more often as needed if the gauze is saturated or becomes soiled. You should shower daily and allow warm soapy water to run over your wounds. Gently pat dry. Do not submerge your wounds underwater. It is important to keep your wound as clean and dry as possible. It is normal to have drainage from the wounds as they are healing. Monitor your wounds for any new redness, tenderness, or foul-smelling drainage.   When to Call us: Fever greater than 100.5 New redness, drainage, or swelling at incision site Severe pain, nausea, or vomiting     Follow-up You have an appointment scheduled with Dr. Zenia Resides on January 22, 2022 at 10:20am. This will be at the [facility] office at [address] [place], [place], [name]. Please arrive at least 15 minutes prior to your scheduled appointment time.   For questions or concerns, please call the office at [phone].  No tylenol until 4:00 p.m.  Post Anesthesia Home Care Instructions  Activity: Get plenty of rest for the remainder of the day. A responsible individual must stay with you for 24 hours following the procedure.  For the next 24 hours, DO NOT: -Drive a car -Paediatric nurse -Drink alcoholic beverages -Take any  medication unless instructed by your physician -Make any legal decisions or sign important papers.  Meals: Start with liquid foods such as gelatin or soup. Progress to regular foods as tolerated. Avoid greasy, spicy, heavy foods. If nausea and/or vomiting occur, drink only clear liquids until the nausea and/or vomiting subsides. Call your physician if vomiting continues.  Special Instructions/Symptoms: Your throat may feel dry or sore from the anesthesia or the breathing tube placed in your throat during surgery. If this causes discomfort, gargle with warm salt water. The discomfort should disappear within 24 hours.  If you had a scopolamine patch placed behind your ear for the management of post- operative nausea and/or vomiting:  1. The medication in the patch is effective for 72 hours, after which it should be removed.  Wrap patch in a tissue and discard in the trash. Wash hands thoroughly with soap and water. 2. You may remove the patch earlier than 72 hours if you experience unpleasant side effects which may include dry mouth, dizziness or visual disturbances. 3. Avoid touching the patch. Wash your hands with soap and water after contact with the patch.    Information for Discharge Teaching: EXPAREL (bupivacaine liposome injectable suspension)   Your surgeon or anesthesiologist gave you EXPAREL(bupivacaine) to help control your pain after surgery.  EXPAREL is a local anesthetic that provides pain relief by numbing the tissue around the surgical site. EXPAREL is designed to release pain medication over time and can control pain for up to 72 hours. Depending on how you respond to  EXPAREL, you may require less pain medication during your recovery.  Possible side effects: Temporary loss of sensation or ability to move in the area where bupivacaine was injected. Nausea, vomiting, constipation Rarely, numbness and tingling in your mouth or lips, lightheadedness, or anxiety may occur. Call  your doctor right away if you think you may be experiencing any of these sensations, or if you have other questions regarding possible side effects.  Follow all other discharge instructions given to you by your surgeon or nurse. Eat a healthy diet and drink plenty of water or other fluids.  If you return to the hospital for any reason within 96 hours following the administration of EXPAREL, it is important for health care providers to know that you have received this anesthetic. A teal colored band has been placed on your arm with the date, time and amount of EXPAREL you have received in order to alert and inform your health care providers. Please leave this armband in place for the full 96 hours following administration, and then you may remove the band.
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
## 2022-01-11 ENCOUNTER — Ambulatory Visit (HOSPITAL_COMMUNITY): Payer: Medicaid Other | Admitting: Occupational Therapy

## 2022-01-15 ENCOUNTER — Encounter (HOSPITAL_BASED_OUTPATIENT_CLINIC_OR_DEPARTMENT_OTHER): Payer: Self-pay | Admitting: Surgery

## 2022-01-16 ENCOUNTER — Telehealth: Payer: Self-pay | Admitting: Emergency Medicine

## 2022-01-16 NOTE — Telephone Encounter (Signed)
Copied from [place] [id number]. Topic: General - Other >> Jan 16, 2022  9:20 AM Everette C wrote: Reason for CRM: The patient has called to request completion of a prior authorization for their AIMOVIG 140 MG/ML SOAJ [[id number]] injection   Please contact the patient further when possible

## 2022-01-16 NOTE — Telephone Encounter (Signed)
Aimovig PA must be completed by patient's Neurologist.

## 2022-01-17 ENCOUNTER — Encounter (HOSPITAL_COMMUNITY): Payer: 59 | Admitting: Occupational Therapy

## 2022-02-09 ENCOUNTER — Ambulatory Visit (INDEPENDENT_AMBULATORY_CARE_PROVIDER_SITE_OTHER): Payer: Medicaid Other

## 2022-02-09 ENCOUNTER — Ambulatory Visit
Admission: EM | Admit: 2022-02-09 | Discharge: 2022-02-09 | Disposition: A | Discharge status: Home / Self Care | Payer: Medicaid Other | Attending: Nurse Practitioner | Admitting: Nurse Practitioner

## 2022-02-09 ENCOUNTER — Encounter: Payer: Self-pay | Admitting: Emergency Medicine

## 2022-02-09 DIAGNOSIS — M25522 Pain in left elbow: Secondary | ICD-10-CM | POA: Diagnosis not present

## 2022-02-09 DIAGNOSIS — M25571 Pain in right ankle and joints of right foot: Secondary | ICD-10-CM | POA: Diagnosis not present

## 2022-02-09 DIAGNOSIS — W19XXXA Unspecified fall, initial encounter: Secondary | ICD-10-CM

## 2022-02-09 DIAGNOSIS — S86911A Strain of unspecified muscle(s) and tendon(s) at lower leg level, right leg, initial encounter: Secondary | ICD-10-CM | POA: Diagnosis not present

## 2022-02-09 DIAGNOSIS — S46912A Strain of unspecified muscle, fascia and tendon at shoulder and upper arm level, left arm, initial encounter: Secondary | ICD-10-CM

## 2022-02-09 DIAGNOSIS — M25561 Pain in right knee: Secondary | ICD-10-CM

## 2022-02-09 DIAGNOSIS — S96911A Strain of unspecified muscle and tendon at ankle and foot level, right foot, initial encounter: Secondary | ICD-10-CM | POA: Diagnosis not present

## 2022-02-09 NOTE — ED Triage Notes (Signed)
[place] off a 12 ft roof yesterday.  Right ankle and knee pain and left elbow pain.  Has been taking ibuprofen with little relief.

## 2022-02-09 NOTE — ED Provider Notes (Signed)
[facility]    CSN: [id number] Arrival date & time: 02/09/22  1116      History   Chief Complaint No chief complaint on file.   HPI Jimmy [name] is a 31 y.o. male.   The history is provided by the patient.   Patient reports for complaints of right knee pain, right ankle pain, and left elbow pain after he fell approximately 12 feet from a building.  States that he was pressure washing a building that had set up on the roof, states that he stepped back and landed on his feet and then fell to his buttocks.  He denies back pain, inability to ambulate, numbness, tingling, or loss of consciousness.  He states that the pain in the right leg radiates throughout the entire leg.  He denies any previous injury or trauma to the right leg.  States he has been taking ibuprofen for his symptoms.  Past Medical History:  Diagnosis Date   ADHD (attention deficit hyperactivity disorder)    Anxiety    Arthritis    Cancer ([place])    skin cancer   Chest pain 08/2018   GERD (gastroesophageal reflux disease)    no meds   History of COVID-19 04/05/2020   Migraine    migraines due to pinched nerve   Pinched nerve in neck    Scoliosis    Seizure [facility])     Patient Active Problem List   Diagnosis Date Noted   Chronic left shoulder pain 09/13/2021   History of Abnormal LFTs (liver function tests) 12/30/2019   Abnormal liver ultrasound 12/30/2019   Fatty liver 12/30/2019   Right flank pain 12/30/2019   Abdominal wall pain 12/30/2019   Gastroesophageal reflux disease without esophagitis 12/30/2019   Superior labrum anterior-to-posterior (SLAP) tear of left shoulder 10/01/2019   Carpal tunnel syndrome 09/08/2019   Bursitis of shoulder, right 08/31/2019   Incisional hernia, without obstruction or gangrene 05/20/2018   Opiate dependence ([place]) 08/02/2013    Past Surgical History:  Procedure Laterality Date   BICEPT TENODESIS Left 06/21/2020   Procedure: BICEPS TENODESIS TENOLYSIS;   Surgeon: Leandrew Koyanagi, MD;  Location: [place];  Service: Orthopedics;  Laterality: Left;   ESOPHAGUS SURGERY     as a baby   PILONIDAL CYST EXCISION N/A 01/04/2022   Procedure: TREPHINATION OF PILONIDAL CYST;  Surgeon: Dwan Bolt, MD;  Location: [place];  Service: General;  Laterality: N/A;  sacrum   SHOULDER ARTHROSCOPY WITH BICEPS TENDON REPAIR Left 03/01/2020   Procedure: LEFT SHOULDER ARTHROSCOPY WITH BICEPS TENODESIS;  Surgeon: Leandrew Koyanagi, MD;  Location: [place];  Service: Orthopedics;  Laterality: Left;   SHOULDER ARTHROSCOPY WITH SUBACROMIAL DECOMPRESSION Right 09/01/2019   Procedure: RIGHT SHOULDER ARTHROSCOPY WITH EXTENSIVE DEBRIDEMENT AND SUBACROMIAL DECOMPRESSION;  Surgeon: Leandrew Koyanagi, MD;  Location: [place];  Service: Orthopedics;  Laterality: Right;   VENTRAL HERNIA REPAIR N/A 08/31/2018   Procedure: HERNIA REPAIR VENTRAL ADULT;  Surgeon: Olean Ree, MD;  Location: [facility];  Service: General;  Laterality: N/A;       Home Medications    Prior to Admission medications   Medication Sig Start Date End Date Taking? Authorizing Provider  AIMOVIG 140 MG/ML SOAJ Inject into the skin every 30 (thirty) days. 08/09/21   [provider]  DULoxetine (CYMBALTA) 60 MG capsule Take 1 capsule (60 mg total) by mouth daily in the afternoon. 10/25/21   Charlott Rakes, MD  gabapentin (NEURONTIN) 400 MG capsule Take 1 capsule (400 mg total) by mouth 2 (two) times daily. 09/24/21   Charlott Rakes, MD  Gauze Pads & Dressings (GAUZE DRESSING) 4"X4" PADS 4 each by Does not apply route daily at 6 (six) AM. 01/04/22   Dwan Bolt, MD  methocarbamol (ROBAXIN) 500 MG tablet Take 2 tablets (1,000 mg total) by mouth 2 (two) times daily. 12/03/21   Gildardo Pounds, NP  QUEtiapine (SEROQUEL) 100 MG tablet Take 1 tablet (100 mg total) by mouth at bedtime. 10/25/21   Charlott Rakes, MD  famotidine (PEPCID) 40 MG  tablet Take 1 tablet (40 mg total) by mouth daily. 08/26/18 12/28/19  Robyn Haber, MD    Family History Family History  Problem Relation Age of Onset   Hypertension Mother    Diabetes Mother    Kidney cancer Mother    Diabetes Maternal Aunt    Diabetes Maternal Grandmother    Cancer Maternal Grandmother        unknown type. doing chemo   Cancer Maternal Grandfather    Stomach cancer Neg Hx    Pancreatic cancer Neg Hx    Esophageal cancer Neg Hx    Liver disease Neg Hx    Colon cancer Neg Hx    Rectal cancer Neg Hx     Social History Social History   Tobacco Use   Smoking status: Every Day    Packs/day: 0.75    Years: 15.00    Total pack years: 11.25    Types: Cigarettes    Passive exposure: Never   Smokeless tobacco: Former  Scientific laboratory technician Use: Never used  Substance Use Topics   Alcohol use: Not Currently   Drug use: Not Currently     Allergies   Cyclobenzaprine, Depakote [divalproex sodium], Ritalin [methylphenidate hcl], Naproxen, Other, and Tramadol   Review of Systems Review of Systems   Physical Exam Triage Vital Signs ED Triage Vitals  Enc Vitals Group     BP 02/09/22 1250 127/79     Pulse Rate 02/09/22 1250 81     Resp 02/09/22 1250 18     Temp 02/09/22 1250 98 F (36.7 C)     Temp Source 02/09/22 1250 Oral     SpO2 02/09/22 1250 97 %     Weight --      Height --      Head Circumference --      Peak Flow --      Pain Score 02/09/22 1252 10     Pain Loc --      Pain Edu? --      Excl. in [place]? --    No data found.  Updated Vital Signs BP 127/79 (BP Location: Right Arm)   Pulse 81   Temp 98 F (36.7 C) (Oral)   Resp 18   SpO2 97%   Visual Acuity Right Eye Distance:   Left Eye Distance:   Bilateral Distance:    Right Eye Near:   Left Eye Near:    Bilateral Near:     Physical Exam Vitals and nursing note reviewed.  Constitutional:      General: He is not in acute distress.    Appearance: Normal appearance.  Eyes:      Extraocular Movements: Extraocular movements intact.     Pupils: Pupils are equal, round, and reactive to light.  Cardiovascular:     Rate and Rhythm: Normal rate and regular rhythm.     Pulses:  Normal pulses.     Heart sounds: Normal heart sounds.  Pulmonary:     Effort: Pulmonary effort is normal. No respiratory distress.     Breath sounds: Normal breath sounds. No stridor. No wheezing, rhonchi or rales.  Abdominal:     General: Bowel sounds are normal.     Palpations: Abdomen is soft.     Tenderness: There is no abdominal tenderness.  Musculoskeletal:     Left elbow: No swelling or deformity. Tenderness present in olecranon process.     Cervical back: Normal range of motion.     Right knee: No swelling, deformity or crepitus. Decreased range of motion. Tenderness present over the medial joint line and lateral joint line. Normal pulse.     Right ankle: Swelling present. No deformity. Tenderness present over the lateral malleolus. Decreased range of motion.  Lymphadenopathy:     Cervical: No cervical adenopathy.  Skin:    General: Skin is warm and dry.  Neurological:     General: No focal deficit present.     Mental Status: He is alert and oriented to person, place, and time.  Psychiatric:        Mood and Affect: Mood normal.      UC Treatments / Results  Labs (all labs ordered are listed, but only abnormal results are displayed) Labs Reviewed - No data to display  EKG   Radiology DG Elbow Complete Left  Result Date: 02/09/2022 CLINICAL DATA:  Left elbow pain after fall yesterday. EXAM: LEFT ELBOW - COMPLETE 3+ VIEW COMPARISON:  None Available. FINDINGS: There is no evidence of fracture, dislocation, or joint effusion. There is no evidence of arthropathy or other focal bone abnormality. Soft tissues are unremarkable. IMPRESSION: Negative. Electronically Signed   By: Marijo Conception M.D.   On: 02/09/2022 13:24   DG Knee Complete 4 Views Right  Result Date:  02/09/2022 CLINICAL DATA:  Right knee pain after fall. EXAM: RIGHT KNEE - COMPLETE 4+ VIEW COMPARISON:  None Available. FINDINGS: No evidence of fracture, dislocation, or joint effusion. No evidence of arthropathy or other focal bone abnormality. Soft tissues are unremarkable. IMPRESSION: Negative. Electronically Signed   By: Marijo Conception M.D.   On: 02/09/2022 13:23   DG Ankle Complete Right  Result Date: 02/09/2022 CLINICAL DATA:  Right ankle pain after fall yesterday. EXAM: RIGHT ANKLE - COMPLETE 3+ VIEW COMPARISON:  None Available. FINDINGS: There is no evidence of fracture, dislocation, or joint effusion. There is no evidence of arthropathy or other focal bone abnormality. Soft tissues are unremarkable. IMPRESSION: Negative. Electronically Signed   By: Marijo Conception M.D.   On: 02/09/2022 13:22    Procedures Procedures (including critical care time)  Medications Ordered in UC Medications - No data to display  Initial Impression / Assessment and Plan / UC Course  I have reviewed the triage vital signs and the nursing notes.  Pertinent labs & imaging results that were available during my care of the patient were reviewed by me and considered in my medical decision making (see chart for details).  Patient presents after he fell from a 12 foot Bldg. 1 day ago.  On exam, he is well-appearing, he is in no acute distress, vital signs are stable.  He complains of pain to the right knee, right ankle, and left elbow.  X-rays were completed, all of which were negative for fracture or dislocation.  Symptoms are consistent with a right ankle strain, left ankle strain, and right knee  strain based on the mechanism of injury.  Ace wrap was provided for the right ankle and a knee brace was provided for the right knee.  Supportive care recommendations were provided to the patient to include continuing current medications he is taking including ibuprofen, methocarbamol, and gabapentin.  RICE therapy, rest,  ice, compression, and elevation and range of motion and stretching exercises.  Patient was advised to follow-up with orthopedics if symptoms fail to improve over the next 2 to 4 weeks.  Patient verbalizes understanding.  All questions were answered.  Patient is stable for discharge. Final Clinical Impressions(s) / UC Diagnoses   Final diagnoses:  Knee strain, right, initial encounter  Right ankle strain, initial encounter  Elbow strain, left, initial encounter  Fall, initial encounter     Discharge Instructions      The x-rays of your right knee, right ankle, and left elbow are negative for fracture or dislocation. Continue ibuprofen and methocarbamol, which you are currently taking for pain or discomfort. Use the Ace wrap and the knee brace to provide compression and support.  Recommend the use of ice or heat as needed.  Apply ice for pain or swelling, heat for spasm or stiffness.  Apply for 20 minutes, remove for 1 hour, then repeat is much as possible. Gentle stretching and range of motion exercises while symptoms persist. If you develop new symptoms such as numbness, tingling, weakness, inability to walk, or other concerns, please go to the emergency department immediately for further evaluation. If you continue to experience symptoms over the next 2 to 4 weeks, recommend that you follow-up with orthopedics for further evaluation.  You can follow-up with [facility] at [phone] or with [facility] at [phone]. Follow-up as needed.     ED Prescriptions   None    PDMP not reviewed this encounter.   Tish Men, NP 02/09/22 1335

## 2022-02-09 NOTE — Discharge Instructions (Addendum)
The x-rays of your right knee, right ankle, and left elbow are negative for fracture or dislocation. Continue ibuprofen and methocarbamol, which you are currently taking for pain or discomfort. Use the Ace wrap and the knee brace to provide compression and support.  Recommend the use of ice or heat as needed.  Apply ice for pain or swelling, heat for spasm or stiffness.  Apply for 20 minutes, remove for 1 hour, then repeat is much as possible. Gentle stretching and range of motion exercises while symptoms persist. If you develop new symptoms such as numbness, tingling, weakness, inability to walk, or other concerns, please go to the emergency department immediately for further evaluation. If you continue to experience symptoms over the next 2 to 4 weeks, recommend that you follow-up with orthopedics for further evaluation.  You can follow-up with [facility] [place] at [phone] or with [facility] at [phone]. Follow-up as needed.

## 2022-02-12 ENCOUNTER — Encounter: Payer: 59 | Admitting: Family Medicine

## 2022-02-14 NOTE — Progress Notes (Deleted)
   I, Peterson Lombard, LAT, ATC acting as a scribe for Lynne Leader, MD.  AVEN CHRISTEN is a 31 y.o. male who presents to [place] at [facility] today for R knee, R ankle, and R elbow pain. Pt was seen at the [facility] on 02/09/22 after falling approx 63f from ladder while he was pressure washing a building. Pt locates pain to   R Knee swelling: Mechanical symptoms: Radiates: Aggravates: Treatments tried:  Dx imaging: 02/09/22 R knee XR  Pertinent review of systems: ***  Relevant historical information: ***   Exam:  There were no vitals taken for this visit. General: Well Developed, well nourished, and in no acute distress.   MSK: ***    Lab and Radiology Results No results found for this or any previous visit (from the past 72 hour(s)). No results found.     Assessment and Plan: 31y.o. male with ***   PDMP not reviewed this encounter. No orders of the defined types were placed in this encounter.  No orders of the defined types were placed in this encounter.    Discussed warning signs or symptoms. Please see discharge instructions. Patient expresses understanding.   ***

## 2022-02-15 ENCOUNTER — Ambulatory Visit: Payer: Medicaid Other | Admitting: Family Medicine

## 2022-02-15 DIAGNOSIS — M25561 Pain in right knee: Secondary | ICD-10-CM

## 2022-02-19 ENCOUNTER — Telehealth: Payer: Self-pay | Admitting: Family Medicine

## 2022-02-19 DIAGNOSIS — G43809 Other migraine, not intractable, without status migrainosus: Secondary | ICD-10-CM

## 2022-02-19 DIAGNOSIS — R569 Unspecified convulsions: Secondary | ICD-10-CM

## 2022-02-19 NOTE — Telephone Encounter (Addendum)
Looks as though Neurology had made an apt with patient for a 40 minute new patient apt on 12/11/2021. Patient no showed apt and was dismissed.   Rx patient requesting was not prescribed by PCP. States a historical provider. This message will be  forwarded.

## 2022-02-19 NOTE — Telephone Encounter (Signed)
I have placed an urgent new neurology referral

## 2022-02-19 NOTE — Telephone Encounter (Signed)
Pt called back.  Unable to reach anyone.

## 2022-02-19 NOTE — Telephone Encounter (Signed)
Pt has been waiting on [facility] for almost 2 months and havent heard anything from them at all/ pt needs refill for AIMOVIG 140 MG/ML SOAJ /  [facility] sent him this medication to the pharmacy but it needs a PA and that office is closed down at the moment / pt needs new script for Aimovig and it will need PA / please advise asap / pt asked if Dr. Margarita Rana or her nurse can please call asap

## 2022-02-19 NOTE — Telephone Encounter (Signed)
Mailbox is full. Unable to leave voicemail.  Unable to reach patient regarding referral being placed and to be aware that Neurology can call to schedule apt.   Called twice.

## 2022-02-20 NOTE — Telephone Encounter (Signed)
Patient aware that an Urgent Neurology referral was placed in [place]. He was encouraged to clear voicemail to allow a message to be left and try to answer phone call from unusual number.   Given address and phone number to Neurology office. He was able to take information (phone number). Patient verbalized understanding.

## 2022-02-21 ENCOUNTER — Telehealth: Payer: Self-pay

## 2022-02-21 NOTE — Telephone Encounter (Signed)
Seroquel prior authorization has been approved until 02/21/2023

## 2022-03-05 ENCOUNTER — Telehealth: Payer: Self-pay | Admitting: Emergency Medicine
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
## 2022-03-05 NOTE — Telephone Encounter (Signed)
Pt called and given contact information for referral.

## 2022-03-05 NOTE — Telephone Encounter (Signed)
Copied from [place] [id number]. Topic: Referral - Status >> Mar 05, 2022  2:51 PM Cyndi Bender wrote: Reason for CRM: Pt stated he received a referral for neurology in [place] but he has not heard from anyone. Pt requests return call to discuss. Cb# [phone]

## 2022-03-06 ENCOUNTER — Ambulatory Visit (HOSPITAL_COMMUNITY): Payer: Medicaid Other | Admitting: Occupational Therapy

## 2022-03-06 ENCOUNTER — Ambulatory Visit (HOSPITAL_COMMUNITY): Payer: Medicaid Other | Attending: Family Medicine

## 2022-03-06 DIAGNOSIS — M25511 Pain in right shoulder: Secondary | ICD-10-CM | POA: Insufficient documentation

## 2022-03-06 DIAGNOSIS — M5412 Radiculopathy, cervical region: Secondary | ICD-10-CM | POA: Insufficient documentation

## 2022-03-06 DIAGNOSIS — M542 Cervicalgia: Secondary | ICD-10-CM | POA: Insufficient documentation

## 2022-03-06 DIAGNOSIS — G8929 Other chronic pain: Secondary | ICD-10-CM | POA: Insufficient documentation

## 2022-03-06 DIAGNOSIS — M25512 Pain in left shoulder: Secondary | ICD-10-CM | POA: Diagnosis present

## 2022-03-06 NOTE — Therapy (Signed)
Patient was screened for OT due to PT covering all needed deficits of neck and shoulder. Pt informed and agreeable to only working with PT.   Paulita Fujita, OTR/L [facility] [phone]

## 2022-03-07 ENCOUNTER — Encounter: Payer: Self-pay | Admitting: Family Medicine

## 2022-03-07 ENCOUNTER — Ambulatory Visit: Payer: Medicaid Other | Attending: Family Medicine | Admitting: Family Medicine

## 2022-03-07 VITALS — BP 142/83 | HR 82 | Ht 64.0 in | Wt 165.0 lb

## 2022-03-07 DIAGNOSIS — Z23 Encounter for immunization: Secondary | ICD-10-CM | POA: Diagnosis not present

## 2022-03-07 DIAGNOSIS — Z0000 Encounter for general adult medical examination without abnormal findings: Secondary | ICD-10-CM

## 2022-03-07 DIAGNOSIS — Z1159 Encounter for screening for other viral diseases: Secondary | ICD-10-CM

## 2022-03-07 DIAGNOSIS — Z13220 Encounter for screening for lipoid disorders: Secondary | ICD-10-CM

## 2022-03-07 NOTE — Progress Notes (Signed)
Subjective:  Patient ID: Jimmy [name], male    DOB: [birth year]  Age: 31 y.o. MRN: [id number]  CC: Annual Exam   HPI JASON FRISBEE is a 31 y.o. year old male with a history of  migraines, bipolar disorder, insomnia, chronic left shoulder pain (status post left shoulder arthroscopy, biceps tenodesis tenolysis in 2021 and 2022)   Interval History:  He goes to see a dentist regularly and Ophthalmologist regularly. Admits to not ingesting adequate fruits and veggies. Smokes half a pack of Cigarettes /day and has smoked since he was 11 and is not ready to quit. He walks a lot for exercise Past Medical History:  Diagnosis Date   ADHD (attention deficit hyperactivity disorder)    Anxiety    Arthritis    Cancer ([place])    skin cancer   Chest pain 08/2018   GERD (gastroesophageal reflux disease)    no meds   History of COVID-19 04/05/2020   Migraine    migraines due to pinched nerve   Pinched nerve in neck    Scoliosis    Seizure [facility])     Past Surgical History:  Procedure Laterality Date   BICEPT TENODESIS Left 06/21/2020   Procedure: BICEPS TENODESIS TENOLYSIS;  Surgeon: Leandrew Koyanagi, MD;  Location: [place];  Service: Orthopedics;  Laterality: Left;   ESOPHAGUS SURGERY     as a baby   PILONIDAL CYST EXCISION N/A 01/04/2022   Procedure: TREPHINATION OF PILONIDAL CYST;  Surgeon: Dwan Bolt, MD;  Location: [place];  Service: General;  Laterality: N/A;  sacrum   SHOULDER ARTHROSCOPY WITH BICEPS TENDON REPAIR Left 03/01/2020   Procedure: LEFT SHOULDER ARTHROSCOPY WITH BICEPS TENODESIS;  Surgeon: Leandrew Koyanagi, MD;  Location: [place];  Service: Orthopedics;  Laterality: Left;   SHOULDER ARTHROSCOPY WITH SUBACROMIAL DECOMPRESSION Right 09/01/2019   Procedure: RIGHT SHOULDER ARTHROSCOPY WITH EXTENSIVE DEBRIDEMENT AND SUBACROMIAL DECOMPRESSION;  Surgeon: Leandrew Koyanagi, MD;  Location: [place];  Service:  Orthopedics;  Laterality: Right;   VENTRAL HERNIA REPAIR N/A 08/31/2018   Procedure: HERNIA REPAIR VENTRAL ADULT;  Surgeon: Olean Ree, MD;  Location: [facility];  Service: General;  Laterality: N/A;    Family History  Problem Relation Age of Onset   Hypertension Mother    Diabetes Mother    Kidney cancer Mother    Diabetes Maternal Aunt    Diabetes Maternal Grandmother    Cancer Maternal Grandmother        unknown type. doing chemo   Cancer Maternal Grandfather    Stomach cancer Neg Hx    Pancreatic cancer Neg Hx    Esophageal cancer Neg Hx    Liver disease Neg Hx    Colon cancer Neg Hx    Rectal cancer Neg Hx     Social History   Socioeconomic History   Marital status: Married    Spouse name: Not on file   Number of children: 3   Years of education: Not on file   Highest education level: Not on file  Occupational History   Occupation: Architect work  Tobacco Use   Smoking status: Every Day    Packs/day: 0.75    Years: 15.00    Total pack years: 11.25    Types: Cigarettes    Passive exposure: Never   Smokeless tobacco: Former  Scientific laboratory technician Use: Never used  Substance and Sexual Activity   Alcohol use: Not Currently  Drug use: Not Currently   Sexual activity: Yes  Other Topics Concern   Not on file  Social History Narrative   Right handed   One story home    Drinks caffeine    Social Determinants of Health   Financial Resource Strain: Not on file  Food Insecurity: Not on file  Transportation Needs: Not on file  Physical Activity: Not on file  Stress: Not on file  Social Connections: Not on file    Allergies  Allergen Reactions   Cyclobenzaprine Hives and Nausea Only   Depakote [Divalproex Sodium] Other (See Comments)    "made him crazy"   Ritalin [Methylphenidate Hcl] Other (See Comments)    Increased hyper activeness    Naproxen Nausea Only    Acid reflux   Other Other (See Comments)    [facility]-pt states skin became reddened and he  tasted metal (08-2018)   Tramadol Other (See Comments)    Acid reflux    Outpatient Medications Prior to Visit  Medication Sig Dispense Refill   AIMOVIG 140 MG/ML SOAJ Inject into the skin every 30 (thirty) days.     DULoxetine (CYMBALTA) 60 MG capsule Take 1 capsule (60 mg total) by mouth daily in the afternoon. 30 capsule 6   gabapentin (NEURONTIN) 400 MG capsule Take 1 capsule (400 mg total) by mouth 2 (two) times daily. 60 capsule 3   Gauze Pads & Dressings (GAUZE DRESSING) 4"X4" PADS 4 each by Does not apply route daily at 6 (six) AM. 24 each 1   methocarbamol (ROBAXIN) 500 MG tablet Take 2 tablets (1,000 mg total) by mouth 2 (two) times daily. 120 tablet 1   QUEtiapine (SEROQUEL) 100 MG tablet Take 1 tablet (100 mg total) by mouth at bedtime. 30 tablet 6   Facility-Administered Medications Prior to Visit  Medication Dose Route Frequency Provider Last Rate Last Admin   triamcinolone acetonide (KENALOG) 10 MG/ML injection 20 mg  20 mg Other Once [place], Kelli R, PA-C         ROS Review of Systems  Constitutional:  Negative for activity change and appetite change.  HENT:  Negative for sinus pressure and sore throat.   Eyes:  Negative for visual disturbance.  Respiratory:  Negative for cough, chest tightness and shortness of breath.   Cardiovascular:  Negative for chest pain and leg swelling.  Gastrointestinal:  Negative for abdominal distention, abdominal pain, constipation and diarrhea.  Endocrine: Negative.   Genitourinary:  Negative for dysuria.  Musculoskeletal:  Negative for joint swelling and myalgias.  Skin:  Negative for rash.  Allergic/Immunologic: Negative.   Neurological:  Negative for weakness, light-headedness and numbness.  Psychiatric/Behavioral:  Negative for dysphoric mood and suicidal ideas.     Objective:  BP (!) 142/83   Pulse 82   Ht _0  (1.626 m)   Wt 165 lb (74.8 kg)   SpO2 98%   BMI 28.32 kg/m      03/07/2022    8:35 AM 02/09/2022    12:50 PM 01/04/2022   10:45 AM  BP/Weight  Systolic BP 390 300 923  Diastolic BP 83 79 71  Wt. (Lbs) 165    BMI 28.32 kg/m2        Physical Exam Constitutional:      Appearance: He is well-developed.  HENT:     Head: Normocephalic and atraumatic.     Right Ear: External ear normal.     Left Ear: External ear normal.  Eyes:     Conjunctiva/sclera: Conjunctivae  normal.     Pupils: Pupils are equal, round, and reactive to light.  Neck:     Trachea: No tracheal deviation.  Cardiovascular:     Rate and Rhythm: Normal rate and regular rhythm.     Heart sounds: Normal heart sounds. No murmur heard. Pulmonary:     Effort: Pulmonary effort is normal. No respiratory distress.     Breath sounds: Normal breath sounds. No wheezing.  Chest:     Chest wall: No tenderness.  Abdominal:     General: Bowel sounds are normal.     Palpations: Abdomen is soft. There is no mass.     Tenderness: There is no abdominal tenderness.  Musculoskeletal:        General: No tenderness. Normal range of motion.     Cervical back: Normal range of motion and neck supple.  Skin:    General: Skin is warm and dry.  Neurological:     Mental Status: He is alert and oriented to person, place, and time.        Latest Ref Rng & Units 11/15/2021    9:54 AM 08/28/2021   11:46 AM 04/21/2020   10:51 AM  CMP  Glucose 70 - 99 mg/dL 107  135  96   BUN 6 - 20 mg/dL _0 Creatinine 0.61 - 1.24 mg/dL 0.69  0.58  0.82   Sodium 135 - 145 mmol/L 139  136  142   Potassium 3.5 - 5.1 mmol/L 4.7  3.6  4.3   Chloride 98 - 111 mmol/L 110  106  110   CO2 22 - 32 mmol/L _1 Calcium 8.9 - 10.3 mg/dL 9.2  8.8  9.4   Total Protein 6.5 - 8.1 g/dL  6.9  6.7   Total Bilirubin 0.3 - 1.2 mg/dL  0.4  0.4   Alkaline Phos 38 - 126 U/L  73    AST 15 - 41 U/L  18  15   ALT 0 - 44 U/L  19  49     Lipid Panel     Component Value Date/Time   CHOL 198 04/21/2020 1051   CHOL 194 02/04/2019 0838   TRIG 89 04/21/2020  1051   HDL 42 04/21/2020 1051   HDL 45 02/04/2019 0838   CHOLHDL 4.7 04/21/2020 1051   LDLCALC 137 (H) 04/21/2020 1051    CBC    Component Value Date/Time   WBC 14.9 (H) 11/15/2021 0954   RBC 4.64 11/15/2021 0954   HGB 14.2 11/15/2021 0954   HCT 42.3 11/15/2021 0954   PLT 268 11/15/2021 0954   MCV 91.2 11/15/2021 0954   MCH 30.6 11/15/2021 0954   MCHC 33.6 11/15/2021 0954   RDW 13.0 11/15/2021 0954   LYMPHSABS 2.1 11/15/2021 0954   MONOABS 0.8 11/15/2021 0954   EOSABS 0.1 11/15/2021 0954   BASOSABS 0.1 11/15/2021 0954    Lab Results  Component Value Date   HGBA1C 5.4 08/30/2021    Assessment & Plan:  1. Annual physical exam Counseled on 150 minutes of exercise per week, healthy eating (including decreased daily intake of saturated fats, cholesterol, added sugars, sodium), STI prevention, routine healthcare maintenance.   2. Need for hepatitis C screening test - HCV Ab w Reflex to Quant PCR  3. Encounter for lipid screening for cardiovascular disease - CMP14+EGFR - LP+Non-HDL Cholesterol  4. Need for Tdap vaccination - Tdap vaccine greater than or equal to 7yo  IM    No orders of the defined types were placed in this encounter.   Follow-up: Return in about 6 months (around 09/06/2022) for Chronic medical conditions.       Charlott Rakes, MD, FAAFP. [facility] [place], [place]   03/07/2022, 9:29 AM

## 2022-03-07 NOTE — Progress Notes (Signed)
Wants 90 day supply of Seroquel.

## 2022-03-07 NOTE — Patient Instructions (Signed)
Health Maintenance, Male Adopting a healthy lifestyle and getting preventive care are important in promoting health and wellness. Ask your health care provider about: The right schedule for you to have regular tests and exams. Things you can do on your own to prevent diseases and keep yourself healthy. What should I know about diet, weight, and exercise? Eat a healthy diet  Eat a diet that includes plenty of vegetables, fruits, low-fat dairy products, and lean protein. Do not eat a lot of foods that are high in solid fats, added sugars, or sodium. Maintain a healthy weight Body mass index (BMI) is a measurement that can be used to identify possible weight problems. It estimates body fat based on height and weight. Your health care provider can help determine your BMI and help you achieve or maintain a healthy weight. Get regular exercise Get regular exercise. This is one of the most important things you can do for your health. Most adults should: Exercise for at least 150 minutes each week. The exercise should increase your heart rate and make you sweat (moderate-intensity exercise). Do strengthening exercises at least twice a week. This is in addition to the moderate-intensity exercise. Spend less time sitting. Even light physical activity can be beneficial. Watch cholesterol and blood lipids Have your blood tested for lipids and cholesterol at 32 years of age, then have this test every 5 years. You may need to have your cholesterol levels checked more often if: Your lipid or cholesterol levels are high. You are older than 32 years of age. You are at high risk for heart disease. What should I know about cancer screening? Many types of cancers can be detected early and may often be prevented. Depending on your health history and family history, you may need to have cancer screening at various ages. This may include screening for: Colorectal cancer. Prostate cancer. Skin cancer. Lung  cancer. What should I know about heart disease, diabetes, and high blood pressure? Blood pressure and heart disease High blood pressure causes heart disease and increases the risk of stroke. This is more likely to develop in people who have high blood pressure readings or are overweight. Talk with your health care provider about your target blood pressure readings. Have your blood pressure checked: Every 3-5 years if you are 18-39 years of age. Every year if you are 40 years old or older. If you are between the ages of 65 and 75 and are a current or former smoker, ask your health care provider if you should have a one-time screening for abdominal aortic aneurysm (AAA). Diabetes Have regular diabetes screenings. This checks your fasting blood sugar level. Have the screening done: Once every three years after age 45 if you are at a normal weight and have a low risk for diabetes. More often and at a younger age if you are overweight or have a high risk for diabetes. What should I know about preventing infection? Hepatitis B If you have a higher risk for hepatitis B, you should be screened for this virus. Talk with your health care provider to find out if you are at risk for hepatitis B infection. Hepatitis C Blood testing is recommended for: Everyone born from 1945 through 1965. Anyone with known risk factors for hepatitis C. Sexually transmitted infections (STIs) You should be screened each year for STIs, including gonorrhea and chlamydia, if: You are sexually active and are younger than 32 years of age. You are older than 32 years of age and your   health care provider tells you that you are at risk for this type of infection. Your sexual activity has changed since you were last screened, and you are at increased risk for chlamydia or gonorrhea. Ask your health care provider if you are at risk. Ask your health care provider about whether you are at high risk for HIV. Your health care provider  may recommend a prescription medicine to help prevent HIV infection. If you choose to take medicine to prevent HIV, you should first get tested for HIV. You should then be tested every 3 months for as long as you are taking the medicine. Follow these instructions at home: Alcohol use Do not drink alcohol if your health care provider tells you not to drink. If you drink alcohol: Limit how much you have to 0-2 drinks a day. Know how much alcohol is in your drink. In the [place] one drink equals one 12 oz bottle of beer (355 mL), one 5 oz glass of wine (148 mL), or one 1 oz glass of hard liquor (44 mL). Lifestyle Do not use any products that contain nicotine or tobacco. These products include cigarettes, chewing tobacco, and vaping devices, such as e-cigarettes. If you need help quitting, ask your health care provider. Do not use street drugs. Do not share needles. Ask your health care provider for help if you need support or information about quitting drugs. General instructions Schedule regular health, dental, and eye exams. Stay current with your vaccines. Tell your health care provider if: You often feel depressed. You have ever been abused or do not feel safe at home. Summary Adopting a healthy lifestyle and getting preventive care are important in promoting health and wellness. Follow your health care provider's instructions about healthy diet, exercising, and getting tested or screened for diseases. Follow your health care provider's instructions on monitoring your cholesterol and blood pressure. This information is not intended to replace advice given to you by your health care provider. Make sure you discuss any questions you have with your health care provider. Document Revised: 07/24/2020 Document Reviewed: 07/24/2020 Elsevier Patient Education  2023 Elsevier Inc.  

## 2022-03-08 ENCOUNTER — Telehealth (HOSPITAL_COMMUNITY): Payer: Self-pay

## 2022-03-08 ENCOUNTER — Ambulatory Visit (HOSPITAL_COMMUNITY): Payer: Medicaid Other

## 2022-03-08 ENCOUNTER — Encounter: Payer: Self-pay | Admitting: Family Medicine

## 2022-03-08 LAB — CMP14+EGFR
ALT: 17 IU/L (ref 0–44)
AST: 14 IU/L (ref 0–40)
Albumin/Globulin Ratio: 1.9 (ref 1.2–2.2)
Albumin: 4.6 g/dL (ref 4.1–5.1)
Alkaline Phosphatase: 97 IU/L (ref 44–121)
BUN/Creatinine Ratio: 19 (ref 9–20)
BUN: 16 mg/dL (ref 6–20)
Bilirubin Total: <0.2 mg/dL (ref 0.0–1.2)
CO2: 21 mmol/L (ref 20–29)
Calcium: 9.6 mg/dL (ref 8.7–10.2)
Chloride: 101 mmol/L (ref 96–106)
Creatinine, Ser: 0.83 mg/dL (ref 0.76–1.27)
Globulin, Total: 2.4 g/dL (ref 1.5–4.5)
Glucose: 90 mg/dL (ref 70–99)
Potassium: 4.4 mmol/L (ref 3.5–5.2)
Sodium: 137 mmol/L (ref 134–144)
Total Protein: 7 g/dL (ref 6.0–8.5)
eGFR: 119 mL/min/{1.73_m2} (ref >59)

## 2022-03-08 LAB — LP+NON-HDL CHOLESTEROL
Cholesterol, Total: 181 mg/dL (ref 100–199)
HDL: 45 mg/dL (ref >39)
LDL Chol Calc (NIH): 108 mg/dL — ABNORMAL HIGH (ref 0–99)
Total Non-HDL-Chol (LDL+VLDL): 136 mg/dL — ABNORMAL HIGH (ref 0–129)
Triglycerides: 158 mg/dL — ABNORMAL HIGH (ref 0–149)
VLDL Cholesterol Cal: 28 mg/dL (ref 5–40)

## 2022-03-08 LAB — HCV INTERPRETATION

## 2022-03-08 LAB — HCV AB W REFLEX TO QUANT PCR: HCV Ab: NONREACTIVE

## 2022-03-08 NOTE — Telephone Encounter (Signed)
No show #1, called concerning missed apt with no answer and mailbox full so unable to leave message.   Ihor Austin, LPTA/CLT; Delana Meyer [phone]

## 2022-03-15 ENCOUNTER — Encounter (HOSPITAL_COMMUNITY): Payer: Medicaid Other

## 2022-03-19 ENCOUNTER — Ambulatory Visit (HOSPITAL_COMMUNITY): Payer: Medicaid Other | Attending: Family Medicine

## 2022-03-19 ENCOUNTER — Telehealth (HOSPITAL_COMMUNITY): Payer: Self-pay

## 2022-03-19 NOTE — Telephone Encounter (Signed)
No show #2, called and spoke to pt who stated he has began symptoms following his kids being sick for the last couple weeks. Reminded next apt date and time and requested pt to call and cancel/reschedule apts in the future if unable to make it.   Ihor Austin, LPTA/CLT; Delana Meyer [phone]

## 2022-03-20 ENCOUNTER — Encounter (HOSPITAL_COMMUNITY): Payer: Medicaid Other

## 2022-03-22 ENCOUNTER — Encounter (HOSPITAL_COMMUNITY): Payer: Medicaid Other

## 2022-03-22 ENCOUNTER — Encounter (HOSPITAL_COMMUNITY): Payer: Self-pay

## 2022-03-22 NOTE — Therapy (Signed)
PHYSICAL THERAPY DISCHARGE SUMMARY  Visits from Start of Care: 1  Current functional level related to goals / functional outcomes: NA   Remaining deficits: NA   Education / Equipment: NA   Patient agrees to discharge. Patient goals were not met. Patient is being discharged due to not returning since the last visit.   

## 2022-03-25 ENCOUNTER — Encounter: Payer: Self-pay | Admitting: Family Medicine

## 2022-03-25 ENCOUNTER — Encounter (HOSPITAL_COMMUNITY): Payer: Medicaid Other

## 2022-03-26 ENCOUNTER — Other Ambulatory Visit: Payer: Self-pay | Admitting: Family Medicine

## 2022-03-26 DIAGNOSIS — F3161 Bipolar disorder, current episode mixed, mild: Secondary | ICD-10-CM

## 2022-03-26 DIAGNOSIS — M5412 Radiculopathy, cervical region: Secondary | ICD-10-CM

## 2022-03-26 DIAGNOSIS — M25511 Pain in right shoulder: Secondary | ICD-10-CM

## 2022-03-26 DIAGNOSIS — G4709 Other insomnia: Secondary | ICD-10-CM

## 2022-03-26 MED ORDER — QUETIAPINE FUMARATE 100 MG PO TABS: 100.0000 mg | ORAL_TABLET | Freq: Every day | ORAL | 1 refills | Status: DC

## 2022-03-26 MED ORDER — METHOCARBAMOL 500 MG PO TABS: 1000.0000 mg | ORAL_TABLET | Freq: Two times a day (BID) | ORAL | 1 refills | Status: DC

## 2022-03-26 MED ORDER — DULOXETINE HCL 60 MG PO CPEP: 60.0000 mg | ORAL_CAPSULE | Freq: Every day | ORAL | 1 refills | Status: DC

## 2022-03-26 MED ORDER — GABAPENTIN 400 MG PO CAPS: 400.0000 mg | ORAL_CAPSULE | Freq: Two times a day (BID) | ORAL | 1 refills | Status: DC

## 2022-03-27 ENCOUNTER — Encounter: Payer: Self-pay | Admitting: Family Medicine

## 2022-03-29 ENCOUNTER — Encounter (HOSPITAL_COMMUNITY): Payer: Medicaid Other

## 2022-04-03 ENCOUNTER — Encounter: Payer: 59 | Admitting: Family Medicine
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
## 2022-04-08 ENCOUNTER — Ambulatory Visit (INDEPENDENT_AMBULATORY_CARE_PROVIDER_SITE_OTHER): Payer: Medicaid Other | Admitting: Sports Medicine

## 2022-04-08 ENCOUNTER — Ambulatory Visit (INDEPENDENT_AMBULATORY_CARE_PROVIDER_SITE_OTHER): Payer: Medicaid Other

## 2022-04-08 VITALS — HR 96 | Ht 64.0 in | Wt 163.0 lb

## 2022-04-08 DIAGNOSIS — M25512 Pain in left shoulder: Secondary | ICD-10-CM

## 2022-04-08 DIAGNOSIS — M25511 Pain in right shoulder: Secondary | ICD-10-CM

## 2022-04-08 DIAGNOSIS — M542 Cervicalgia: Secondary | ICD-10-CM | POA: Diagnosis not present

## 2022-04-08 DIAGNOSIS — M545 Low back pain, unspecified: Secondary | ICD-10-CM | POA: Diagnosis not present

## 2022-04-08 DIAGNOSIS — G8929 Other chronic pain: Secondary | ICD-10-CM

## 2022-04-08 MED ORDER — MELOXICAM 15 MG PO TABS: 15.0000 mg | ORAL_TABLET | Freq: Every day | ORAL | 0 refills | Status: DC

## 2022-04-08 NOTE — Patient Instructions (Addendum)
Good to see you  - Start meloxicam 15 mg daily x2 weeks.  If still having pain after 2 weeks, complete 3rd-week of meloxicam. May use remaining meloxicam as needed once daily for pain control.  Do not to use additional NSAIDs while taking meloxicam.  May use Tylenol [phone] mg 2 to 3 times a day for breakthrough pain. Low back HEP 4-6 week follow up
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
## 2022-05-06 ENCOUNTER — Ambulatory Visit: Payer: Medicaid Other | Admitting: Sports Medicine

## 2022-05-06 ENCOUNTER — Telehealth: Payer: Self-pay | Admitting: Emergency Medicine

## 2022-05-06 ENCOUNTER — Encounter: Payer: Self-pay | Admitting: *Deleted

## 2022-05-06 DIAGNOSIS — F319 Bipolar disorder, unspecified: Secondary | ICD-10-CM

## 2022-05-06 NOTE — Telephone Encounter (Signed)
Copied from [place] [id number]. Topic: Referral - Request for Referral >> May 06, 2022 12:37 PM Everette C wrote: Has patient seen PCP for this complaint? Yes.   *If NO, is insurance requiring patient see PCP for this issue before PCP can refer them? Referral for which specialty: Behavioral Health  Preferred provider/office: Patient has no preference  Reason for referral: Patient would like to address their behavioral health

## 2022-05-07 DIAGNOSIS — F319 Bipolar disorder, unspecified: Secondary | ICD-10-CM | POA: Insufficient documentation

## 2022-05-07 NOTE — Telephone Encounter (Signed)
Patient is requesting referral.

## 2022-05-07 NOTE — Telephone Encounter (Signed)
Referral has been placed. 

## 2022-05-07 NOTE — Addendum Note (Signed)
Addended by: Charlott Rakes on: 05/07/2022 06:16 PM   Modules accepted: Orders

## 2022-05-10 NOTE — Telephone Encounter (Signed)
Noted  

## 2022-05-27 ENCOUNTER — Ambulatory Visit: Payer: Medicaid Other | Admitting: *Deleted
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
## 2022-05-27 NOTE — Telephone Encounter (Signed)
  Chief Complaint: stress, anxiety, depression Symptoms: crying, lonely, states his family is gone Frequency: patient's wife left him Pertinent Negatives: Patient denies suicidal thought- but states he does not want to be alone. Patient states his mother is with him now Disposition: [x] ED /[] Urgent Care (no appt availability in office) / [] Appointment(In office/virtual)/ []  [facility]/ [] Home Care/ [] Refused Recommended Disposition /[] [facility]/ []  Follow-up with PCP Additional Notes: Patient advised ED for help with his depression

## 2022-05-27 NOTE — Telephone Encounter (Signed)
FYI

## 2022-06-05 ENCOUNTER — Encounter: Payer: Self-pay | Admitting: Family Medicine

## 2022-06-06 ENCOUNTER — Telehealth: Payer: Self-pay
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
## 2022-06-06 NOTE — Telephone Encounter (Signed)
Call placed to patient. Patient sounds distressed, patient crying and voice is shaky. Introduced myself as the nurse  with his PCP office. Patient made aware that we did receive the message that was sent to his MD on yesterday. Asked patient to tell me what his going on. Patient voiced that I need help  and he felt like flipping his car. Patient asked if he felt hi was going to hurt himself. Questioned patient  to where he was now. Patient voiced that he was driving he car. Patient said he was not to far from home. Been sleeping in his truck because he just could not go home. Asked patient what was the reason he could go home . Patient voiced to nurse he just could not go home. Patient voiced that he just feels alone , his wife left him , he wants to see his children and he can't and he just can't take it more. Patient voiced I need help now or I going to kill myself. Nurse asked patient how far was he away for the hospital he said that he was about 10 min away.  Nurse asked how far was he away for home patient voiced that he was about 2 min away. Nurse asked  if he could he pull over and call 911. Patient voiced he was almost home he did want to leave his car. Nurse told the patient that she would stay on the phone with him until got home once he was home we would call  the 911. Patient voiced to the nurse the police was behind him now. Nurse could hear police officer asking patient why was he going so fast. Patient voiced to the officer that  he did realize he was going that fast. He was on the phone with his doctor's office and they were trying to get  him help. Nurse asked patient to let her speak with police officer . Engineer, structural on the phone nurse voiced to the officer that patient needs to be taken to ER evaluation.  Patient Voiced   to nurse that he felt that he could hurt himself and he had a plan. Nurse could hear the the patient crying  and the police officer asked patient if he was trying to hurt  himself. Patient voices no, he just  feels like he could just flip this car. Police voiced that sounds like you want to hurt yourself. Nurse voiced to the police officer that the patient should not drive . He  should seek help at the ED. Police officer voiced to patient , Man looks like you are having a Bad day I just want to get you some help. Take off your seat belt off  Not arresting you I  am gonna take you to the hospital to get you some help. Officer asked patient if he needed  help. Patient answered yes. Nurse asked what hospital he was taking to. Officer voiced [employer]. Call ended

## 2022-06-12 ENCOUNTER — Telehealth: Payer: Self-pay | Admitting: Family Medicine

## 2022-06-12 NOTE — Telephone Encounter (Signed)
Routing to PCP for review.

## 2022-06-12 NOTE — Telephone Encounter (Signed)
Copied from [place] [id number]. Topic: General - Other >> Jun 12, 2022  1:53 PM Oley Balm A wrote: Reason for CRM: Vivi Martens from [facility] in [place] states they received a referral for this pt. Based off the providers notes the pt is needing to be seen in person. The pt will need hospitalization before he does out patient care. After that he can be referred to [facility].  Please advise.

## 2022-06-12 NOTE — Telephone Encounter (Signed)
I had referred him to the ED on 06/06/2022 but I have no control over whether they hospitalize him or not.  Can you please follow-up with this practice so that patient can receive the care he needs?  Thank you.

## 2022-06-17 NOTE — Telephone Encounter (Signed)
Call placed to [facility] to discuss patient and  patients'  referral status.

## 2022-06-19 ENCOUNTER — Telehealth: Payer: Self-pay | Admitting: Family Medicine

## 2022-06-19 ENCOUNTER — Telehealth: Payer: Self-pay

## 2022-06-19 ENCOUNTER — Other Ambulatory Visit: Payer: Self-pay

## 2022-06-19 ENCOUNTER — Encounter (HOSPITAL_COMMUNITY): Payer: Self-pay | Admitting: Emergency Medicine

## 2022-06-19 ENCOUNTER — Emergency Department (HOSPITAL_COMMUNITY)
Admission: EM | Admit: 2022-06-19 | Discharge: 2022-06-19 | Discharge status: Against Medical Advice | Payer: Medicaid Other | Attending: Emergency Medicine | Admitting: Emergency Medicine

## 2022-06-19 ENCOUNTER — Ambulatory Visit: Payer: Self-pay | Admitting: *Deleted

## 2022-06-19 DIAGNOSIS — R197 Diarrhea, unspecified: Secondary | ICD-10-CM | POA: Insufficient documentation

## 2022-06-19 DIAGNOSIS — Z5321 Procedure and treatment not carried out due to patient leaving prior to being seen by health care provider: Secondary | ICD-10-CM | POA: Diagnosis not present

## 2022-06-19 DIAGNOSIS — R42 Dizziness and giddiness: Secondary | ICD-10-CM | POA: Diagnosis not present

## 2022-06-19 NOTE — ED Notes (Signed)
Pt no longer in room, checked bathroom and pt not there.

## 2022-06-19 NOTE — Telephone Encounter (Signed)
2nd attempt, Patient called, left VM to return the call to the office to discuss symptoms with a nurse.  

## 2022-06-19 NOTE — Telephone Encounter (Signed)
Copied from [place] [phone]. Topic: Referral - Status >> Jun 19, 2022  1:27 PM Cyndi Bender wrote: Reason for CRM: Pt requests a referral for hernia surgeon

## 2022-06-19 NOTE — ED Triage Notes (Signed)
Pt states about 5 days ago his hernia started poking out. Pt also c/o dizziness and diarrhea.

## 2022-06-19 NOTE — Telephone Encounter (Signed)
3rd attempt, Patient called, left VM to return the call to the office to discuss symptoms with a nurse.. Unable to reach patient after 3 attempts by [facility] NT, routing to the provider for resolution per protocol.  Summary: Pt reports that he has been experiencing being dizzy   Pt reports that he has been experiencing being dizzy off an on for the past 5 days. Cb# [phone]

## 2022-06-19 NOTE — Telephone Encounter (Signed)
Summary: Pt reports that he has been experiencing being dizzy   Pt reports that he has been experiencing being dizzy off an on for the past 5 days. Cb# [phone]     Attempted to call patient regarding his symptoms- left message on VM to call office.

## 2022-06-19 NOTE — ED Provider Notes (Signed)
Went back in the room x2 and patient was not present. Attempted to call patient on his cell phone x2 without answer. Eloped.    Achille RichRansom, Julien Berryman, PA-C 06/19/22 1425    Cathren LaineSteinl, Kevin, MD 06/21/22 1150

## 2022-06-20 NOTE — Telephone Encounter (Signed)
Routing to PCP for review.

## 2022-06-20 NOTE — Telephone Encounter (Signed)
Patient seen at [facility] ED on 06/19/2022 for hernia started poking out and c/o dizziness and diarrhea. Patient eloped for the ED and assessment not completed . Call to follow- up with patient unable to reach. Noted multiple attempts to contact patient . Letter mailed to current address requesting that he contact office ASAP.

## 2022-06-21 NOTE — Telephone Encounter (Signed)
Unable to contact patient . Letter sent to call office

## 2022-06-21 NOTE — Telephone Encounter (Signed)
Attempted to call patient and LVM for patient to return phone call.

## 2022-06-21 NOTE — Telephone Encounter (Signed)
I do not have any information to place the referral.  He eloped from the ER and there are no notes.  He needs a visit to discuss.

## 2022-06-22 ENCOUNTER — Ambulatory Visit
Admission: EM | Admit: 2022-06-22 | Discharge: 2022-06-22 | Disposition: A | Discharge status: Home / Self Care | Payer: Medicaid Other | Attending: Physician Assistant | Admitting: Physician Assistant

## 2022-06-22 DIAGNOSIS — Z113 Encounter for screening for infections with a predominantly sexual mode of transmission: Secondary | ICD-10-CM

## 2022-06-22 NOTE — Discharge Instructions (Signed)
Will call with test results if positive and treat if indicated Abstain from sexual activity until test results are back and you have completed any necessary treatment.

## 2022-06-22 NOTE — ED Triage Notes (Addendum)
Pt reports he wants STD testing (HIV+ Syphilis) to ensure that his partner didn't give him anything. Pt denies having sxs.

## 2022-06-22 NOTE — ED Provider Notes (Signed)
[facility]    CSN: [id number] Arrival date & time: 06/22/22  [id number]      History   Chief Complaint No chief complaint on file.   HPI Jimmy [name] is a 32 y.o. male.   Patient presents for STD screening.  He reports he is asymptomatic at this time.  He denies sexual partners with positive test results.    Past Medical History:  Diagnosis Date   ADHD (attention deficit hyperactivity disorder)    Anxiety    Arthritis    Cancer    skin cancer   Chest pain 08/2018   GERD (gastroesophageal reflux disease)    no meds   History of COVID-19 04/05/2020   Migraine    migraines due to pinched nerve   Pinched nerve in neck    Scoliosis    Seizure     Patient Active Problem List   Diagnosis Date Noted   Bipolar 1 disorder 05/07/2022   Chronic left shoulder pain 09/13/2021   History of Abnormal LFTs (liver function tests) 12/30/2019   Abnormal liver ultrasound 12/30/2019   Fatty liver 12/30/2019   Right flank pain 12/30/2019   Abdominal wall pain 12/30/2019   Gastroesophageal reflux disease without esophagitis 12/30/2019   Superior labrum anterior-to-posterior (SLAP) tear of left shoulder 10/01/2019   Carpal tunnel syndrome 09/08/2019   Bursitis of shoulder, right 08/31/2019   Incisional hernia, without obstruction or gangrene 05/20/2018   Opiate dependence 08/02/2013    Past Surgical History:  Procedure Laterality Date   BICEPT TENODESIS Left 06/21/2020   Procedure: BICEPS TENODESIS TENOLYSIS;  Surgeon: Tarry KosXu, Naiping M, MD;  Location: [facility];  Service: Orthopedics;  Laterality: Left;   ESOPHAGUS SURGERY     as a baby   PILONIDAL CYST EXCISION N/A 01/04/2022   Procedure: TREPHINATION OF PILONIDAL CYST;  Surgeon: Fritzi MandesAllen, Shelby L, MD;  Location: [facility];  Service: General;  Laterality: N/A;  sacrum   SHOULDER ARTHROSCOPY WITH BICEPS TENDON REPAIR Left 03/01/2020   Procedure: LEFT SHOULDER ARTHROSCOPY WITH BICEPS TENODESIS;   Surgeon: Tarry KosXu, Naiping M, MD;  Location: [facility];  Service: Orthopedics;  Laterality: Left;   SHOULDER ARTHROSCOPY WITH SUBACROMIAL DECOMPRESSION Right 09/01/2019   Procedure: RIGHT SHOULDER ARTHROSCOPY WITH EXTENSIVE DEBRIDEMENT AND SUBACROMIAL DECOMPRESSION;  Surgeon: Tarry KosXu, Naiping M, MD;  Location: [facility];  Service: Orthopedics;  Laterality: Right;   VENTRAL HERNIA REPAIR N/A 08/31/2018   Procedure: HERNIA REPAIR VENTRAL ADULT;  Surgeon: Henrene DodgePiscoya, Jose, MD;  Location: [facility];  Service: General;  Laterality: N/A;       Home Medications    Prior to Admission medications   Medication Sig Start Date End Date Taking? Authorizing Provider  AIMOVIG 140 MG/ML SOAJ Inject into the skin every 30 (thirty) days. 08/09/21   [provider]  DULoxetine (CYMBALTA) 60 MG capsule Take 1 capsule (60 mg total) by mouth daily in the afternoon. 03/26/22   Hoy RegisterNewlin, Enobong, MD  gabapentin (NEURONTIN) 400 MG capsule Take 1 capsule (400 mg total) by mouth 2 (two) times daily. 03/26/22   Hoy RegisterNewlin, Enobong, MD  Gauze Pads & Dressings (GAUZE DRESSING) 4"X4" PADS 4 each by Does not apply route daily at 6 (six) AM. 01/04/22   Fritzi MandesAllen, Shelby L, MD  meloxicam (MOBIC) 15 MG tablet Take 1 tablet (15 mg total) by mouth daily. 04/08/22   Richardean SaleJackson, Benjamin, DO  methocarbamol (ROBAXIN) 500 MG tablet Take 2 tablets (1,000 mg total) by mouth 2 (two)  times daily. 03/26/22   Hoy Register, MD  QUEtiapine (SEROQUEL) 100 MG tablet Take 1 tablet (100 mg total) by mouth at bedtime. 03/26/22   Hoy Register, MD  famotidine (PEPCID) 40 MG tablet Take 1 tablet (40 mg total) by mouth daily. 08/26/18 12/28/19  Elvina Sidle, MD    Family History Family History  Problem Relation Age of Onset   Hypertension Mother    Diabetes Mother    Kidney cancer Mother    Diabetes Maternal Aunt    Diabetes Maternal Grandmother    Cancer Maternal Grandmother        unknown type. doing chemo   Cancer Maternal  Grandfather    Stomach cancer Neg Hx    Pancreatic cancer Neg Hx    Esophageal cancer Neg Hx    Liver disease Neg Hx    Colon cancer Neg Hx    Rectal cancer Neg Hx     Social History Social History   Tobacco Use   Smoking status: Every Day    Packs/day: 0.75    Years: 15.00    Additional pack years: 0.00    Total pack years: 11.25    Types: Cigarettes    Passive exposure: Never   Smokeless tobacco: Former  Building services engineer Use: Some days  Substance Use Topics   Alcohol use: Not Currently   Drug use: Not Currently     Allergies   Cyclobenzaprine, Depakote [divalproex sodium], Ritalin [methylphenidate hcl], Naproxen, Other, and Tramadol   Review of Systems Review of Systems  Constitutional:  Negative for chills and fever.  HENT:  Negative for ear pain and sore throat.   Eyes:  Negative for pain and visual disturbance.  Respiratory:  Negative for cough and shortness of breath.   Cardiovascular:  Negative for chest pain and palpitations.  Gastrointestinal:  Negative for abdominal pain and vomiting.  Genitourinary:  Negative for dysuria and hematuria.  Musculoskeletal:  Negative for arthralgias and back pain.  Skin:  Negative for color change and rash.  Neurological:  Negative for seizures and syncope.  All other systems reviewed and are negative.    Physical Exam Triage Vital Signs ED Triage Vitals  Enc Vitals Group     BP 06/22/22 0815 117/64     Pulse Rate 06/22/22 0815 90     Resp 06/22/22 0815 20     Temp 06/22/22 0815 98.1 F (36.7 C)     Temp Source 06/22/22 0815 Oral     SpO2 06/22/22 0815 98 %     Weight --      Height --      Head Circumference --      Peak Flow --      Pain Score 06/22/22 0819 0     Pain Loc --      Pain Edu? --      Excl. in GC? --    No data found.  Updated Vital Signs BP 117/64 (BP Location: Right Arm)   Pulse 90   Temp 98.1 F (36.7 C) (Oral)   Resp 20   SpO2 98%   Visual Acuity Right Eye Distance:   Left  Eye Distance:   Bilateral Distance:    Right Eye Near:   Left Eye Near:    Bilateral Near:     Physical Exam Vitals and nursing note reviewed.  Constitutional:      General: He is not in acute distress.    Appearance: He is well-developed.  HENT:  Head: Normocephalic and atraumatic.  Eyes:     Conjunctiva/sclera: Conjunctivae normal.  Cardiovascular:     Rate and Rhythm: Normal rate and regular rhythm.     Heart sounds: No murmur heard. Pulmonary:     Effort: Pulmonary effort is normal. No respiratory distress.     Breath sounds: Normal breath sounds.  Abdominal:     Palpations: Abdomen is soft.     Tenderness: There is no abdominal tenderness.  Musculoskeletal:        General: No swelling.     Cervical back: Neck supple.  Skin:    General: Skin is warm and dry.     Capillary Refill: Capillary refill takes less than 2 seconds.  Neurological:     Mental Status: He is alert.  Psychiatric:        Mood and Affect: Mood normal.      UC Treatments / Results  Labs (all labs ordered are listed, but only abnormal results are displayed) Labs Reviewed  HIV ANTIBODY (ROUTINE TESTING W REFLEX)  RPR  CYTOLOGY, (ORAL, ANAL, URETHRAL) ANCILLARY ONLY    EKG   Radiology No results found.  Procedures Procedures (including critical care time)  Medications Ordered in UC Medications - No data to display  Initial Impression / Assessment and Plan / UC Course  I have reviewed the triage vital signs and the nursing notes.  Pertinent labs & imaging results that were available during my care of the patient were reviewed by me and considered in my medical decision making (see chart for details).     States screening today.  Self swab cytology sent.  HIV and syphilis sent.  Will call with test results and treat if indicated.  Patient is asymptomatic at this time. Final Clinical Impressions(s) / UC Diagnoses   Final diagnoses:  Screen for STD (sexually transmitted disease)      Discharge Instructions      Will call with test results if positive and treat if indicated Abstain from sexual activity until test results are back and you have completed any necessary treatment.    ED Prescriptions   None    PDMP not reviewed this encounter.   Ward, Tylene Fantasia, PA-C 06/22/22 [phone]

## 2022-06-23 LAB — RPR: RPR Ser Ql: NONREACTIVE

## 2022-06-23 LAB — HIV ANTIBODY (ROUTINE TESTING W REFLEX): HIV Screen 4th Generation wRfx: NONREACTIVE

## 2022-06-24 LAB — CYTOLOGY, (ORAL, ANAL, URETHRAL) ANCILLARY ONLY
Chlamydia: NEGATIVE
Comment: NEGATIVE
Comment: NEGATIVE
Comment: NORMAL
Neisseria Gonorrhea: NEGATIVE
Trichomonas: NEGATIVE

## 2022-07-17 ENCOUNTER — Ambulatory Visit: Payer: Medicaid Other | Admitting: Orthopaedic Surgery

## 2022-09-02 ENCOUNTER — Other Ambulatory Visit: Payer: Self-pay | Admitting: Family Medicine

## 2022-09-02 DIAGNOSIS — F3161 Bipolar disorder, current episode mixed, mild: Secondary | ICD-10-CM

## 2022-09-02 DIAGNOSIS — G4709 Other insomnia: Secondary | ICD-10-CM

## 2022-09-09 ENCOUNTER — Ambulatory Visit: Payer: Medicaid Other | Admitting: Family Medicine

## 2022-09-26 ENCOUNTER — Ambulatory Visit: Payer: Medicaid Other | Admitting: Family Medicine

## 2022-10-14 ENCOUNTER — Telehealth: Payer: Medicaid Other | Admitting: Family Medicine

## 2022-10-28 ENCOUNTER — Telehealth: Payer: Self-pay | Admitting: Family Medicine

## 2022-10-28 ENCOUNTER — Telehealth: Payer: Medicaid Other | Admitting: Family Medicine

## 2022-10-28 ENCOUNTER — Encounter: Payer: Self-pay | Admitting: Family Medicine

## 2022-10-28 ENCOUNTER — Telehealth: Payer: Self-pay | Admitting: Licensed Clinical Social Worker

## 2022-10-28 DIAGNOSIS — F4321 Adjustment disorder with depressed mood: Secondary | ICD-10-CM | POA: Diagnosis not present

## 2022-10-28 DIAGNOSIS — Z634 Disappearance and death of family member: Secondary | ICD-10-CM

## 2022-10-28 DIAGNOSIS — F1910 Other psychoactive substance abuse, uncomplicated: Secondary | ICD-10-CM

## 2022-10-28 DIAGNOSIS — F17290 Nicotine dependence, other tobacco product, uncomplicated: Secondary | ICD-10-CM | POA: Diagnosis not present

## 2022-10-28 DIAGNOSIS — F17209 Nicotine dependence, unspecified, with unspecified nicotine-induced disorders: Secondary | ICD-10-CM

## 2022-10-28 DIAGNOSIS — G43809 Other migraine, not intractable, without status migrainosus: Secondary | ICD-10-CM

## 2022-10-28 DIAGNOSIS — F17200 Nicotine dependence, unspecified, uncomplicated: Secondary | ICD-10-CM | POA: Insufficient documentation

## 2022-10-28 DIAGNOSIS — H9202 Otalgia, left ear: Secondary | ICD-10-CM

## 2022-10-28 DIAGNOSIS — F319 Bipolar disorder, unspecified: Secondary | ICD-10-CM

## 2022-10-28 DIAGNOSIS — F1129 Opioid dependence with unspecified opioid-induced disorder: Secondary | ICD-10-CM

## 2022-10-28 DIAGNOSIS — F3161 Bipolar disorder, current episode mixed, mild: Secondary | ICD-10-CM

## 2022-10-28 DIAGNOSIS — G4709 Other insomnia: Secondary | ICD-10-CM

## 2022-10-28 MED ORDER — RIZATRIPTAN BENZOATE 10 MG PO TABS: 10.0000 mg | ORAL_TABLET | ORAL | 1 refills | Status: DC | PRN

## 2022-10-28 MED ORDER — NICOTINE 14 MG/24HR TD PT24: 14.0000 mg | MEDICATED_PATCH | Freq: Every day | TRANSDERMAL | 2 refills | Status: DC

## 2022-10-28 MED ORDER — GABAPENTIN 400 MG PO CAPS: 400.0000 mg | ORAL_CAPSULE | Freq: Two times a day (BID) | ORAL | 1 refills | Status: DC

## 2022-10-28 MED ORDER — CIPROFLOXACIN-DEXAMETHASONE 0.3-0.1 % OT SUSP: 4.0000 [drp] | Freq: Two times a day (BID) | OTIC | 0 refills | Status: DC

## 2022-10-28 MED ORDER — DULOXETINE HCL 60 MG PO CPEP: 60.0000 mg | ORAL_CAPSULE | Freq: Every day | ORAL | 1 refills | Status: DC

## 2022-10-28 MED ORDER — QUETIAPINE FUMARATE 100 MG PO TABS: 100.0000 mg | ORAL_TABLET | Freq: Every day | ORAL | 1 refills | Status: DC

## 2022-10-28 NOTE — Telephone Encounter (Signed)
Can you please assist him with grief counseling resources in [place] as he is currently undergoing drug rehab and is at a Sober living [facility].  His grandmother passed 2 months ago.

## 2022-10-28 NOTE — Telephone Encounter (Signed)
Patients was referred by PCP for grief Counseling in the [place] area, patient is received services there from a program. [facility] called patient and left a brief message asking him to call back. As well as leaving a note in MyChart Message for patient to review.

## 2022-10-28 NOTE — Telephone Encounter (Signed)
Received and sure can.

## 2022-10-28 NOTE — Progress Notes (Signed)
Virtual Visit via Video Note  I connected with Jimmy [name], on 10/28/2022 at 8:40 AM by video enabled telemedicine device and verified that I am speaking with the correct person using two identifiers.   Consent: I discussed the limitations, risks, security and privacy concerns of performing an evaluation and management service by telemedicine and the availability of in person appointments. I also discussed with the patient that there may be a patient responsible charge related to this service. The patient expressed understanding and agreed to proceed.   Location of Patient: Home  Location of Provider: Clinic   Persons participating in Telemedicine visit: Jimmy [name] Dr. Alvis Lemmings     History of Present Illness: Jimmy [name] is a 32 y.o. year old male  with a history of  migraines, bipolar disorder, insomnia, chronic left shoulder pain (status post left shoulder arthroscopy, biceps tenodesis tenolysis in 2021 and 2022), Opioid dependence.  Discussed the use of AI scribe software for clinical note transcription with the patient, who gave verbal consent to proceed.  He is currently residing in a sober living facility ([address] [place] [address]) following detox from opioid and substance abuse. He reports a positive experience at the facility, attending daily AA meetings and receiving assistance with job placement. He is due to complete the program in January. The patient recently experienced the loss of his grandmother, which has been emotionally challenging. He expresses interest in grief counseling but is unsure of available resources in [place].   The patient reports a need for refills on his Seroquel, gabapentin, and duloxetine. He also requests a 21mg  nicotine patch to assist with smoking cessation, having quit smoking in early July. He is currently using a 14mg  patch and vaping, but wishes to stop vaping as well.  The patient has been experiencing severe  pain in his left ear for the past two weeks, which worsens with touch and during showers. He reports a previous episode of blood in his ears which was treated with antibiotic ear drops. He denies sinus symptoms or pressure on his forehead or cheekbones.  The patient also reports frequent migraines, occurring approximately three times a month and lasting for three days at a time. He has previously taken Fioricet for this.        Past Medical History:  Diagnosis Date   ADHD (attention deficit hyperactivity disorder)    Anxiety    Arthritis    Cancer (HCC)    skin cancer   Chest pain 08/2018   GERD (gastroesophageal reflux disease)    no meds   History of COVID-19 04/05/2020   Migraine    migraines due to pinched nerve   Pinched nerve in neck    Scoliosis    Seizure (HCC)    Allergies  Allergen Reactions   Cyclobenzaprine Hives and Nausea Only   Depakote [Divalproex Sodium] Other (See Comments)    "made him crazy"   Ritalin [Methylphenidate Hcl] Other (See Comments)    Increased hyper activeness    Naproxen Nausea Only    Acid reflux   Other Other (See Comments)    [facility]-pt states skin became reddened and he tasted metal (08-2018)   Tramadol Other (See Comments)    Acid reflux    Current Outpatient Medications on File Prior to Visit  Medication Sig Dispense Refill   AIMOVIG 140 MG/ML SOAJ Inject into the skin every 30 (thirty) days.     DULoxetine (CYMBALTA) 60 MG capsule TAKE ONE CAPSULE  BY MOUTH DAILY EVERY afternoon 30 capsule 0   gabapentin (NEURONTIN) 400 MG capsule Take 1 capsule (400 mg total) by mouth 2 (two) times daily. 180 capsule 1   Gauze Pads & Dressings (GAUZE DRESSING) 4"X4" PADS 4 each by Does not apply route daily at 6 (six) AM. 24 each 1   meloxicam (MOBIC) 15 MG tablet Take 1 tablet (15 mg total) by mouth daily. 30 tablet 0   methocarbamol (ROBAXIN) 500 MG tablet Take 2 tablets (1,000 mg total) by mouth 2 (two) times daily. 360 tablet 1   QUEtiapine  (SEROQUEL) 100 MG tablet TAKE 1 TABLET BY MOUTH AT BEDTIME 30 tablet 0   [DISCONTINUED] famotidine (PEPCID) 40 MG tablet Take 1 tablet (40 mg total) by mouth daily. 90 tablet 1   Current Facility-Administered Medications on File Prior to Visit  Medication Dose Route Frequency Provider Last Rate Last Admin   triamcinolone acetonide (KENALOG) 10 MG/ML injection 20 mg  20 mg Other Once Sheffield, Kelli R, PA-C        ROS: See HPI  Observations/Objective: Awake, alert, oriented x3 Not in acute distress No sinus tenderness Normal mood      Latest Ref Rng & Units 03/07/2022    9:10 AM 11/15/2021    9:54 AM 08/28/2021   11:46 AM  CMP  Glucose 70 - 99 mg/dL 90  119  147   BUN 6 - 20 mg/dL 16  13  5    Creatinine 0.76 - 1.27 mg/dL 8.29  5.62  1.30   Sodium 134 - 144 mmol/L 137  139  136   Potassium 3.5 - 5.2 mmol/L 4.4  4.7  3.6   Chloride 96 - 106 mmol/L 101  110  106   CO2 20 - 29 mmol/L 21  25  26    Calcium 8.7 - 10.2 mg/dL 9.6  9.2  8.8   Total Protein 6.0 - 8.5 g/dL 7.0   6.9   Total Bilirubin 0.0 - 1.2 mg/dL <8.6   0.4   Alkaline Phos 44 - 121 IU/L 97   73   AST 0 - 40 IU/L 14   18   ALT 0 - 44 IU/L 17   19     Lipid Panel     Component Value Date/Time   CHOL 181 03/07/2022 0910   TRIG 158 (H) 03/07/2022 0910   HDL 45 03/07/2022 0910   CHOLHDL 4.7 04/21/2020 1051   LDLCALC 108 (H) 03/07/2022 0910   LDLCALC 137 (H) 04/21/2020 1051   LABVLDL 28 03/07/2022 0910    Lab Results  Component Value Date   HGBA1C 5.4 08/30/2021     Assessment and Plan:     Substance Use Disorder Currently in a sober living program until January 10th for completion of inpatient detoxification. -Continue current program and supportive measures.  Grief Recent loss of grandmother causing emotional distress. Expressed interest in grief counseling. -Refer to grief counseling in [place] area.  Migraines Reports frequent episodes, approximately three times per month. No current  prophylactic treatment. -Prescribe Maxalt for acute episodes. -Refer to neurologist in [place] for further management.  Smoking Cessation Stopped smoking on July 5th, currently using 14mg  nicotine patch and vaping. Reports cravings. -Increase nicotine patch to 21mg . -Discussed potential use of Wellbutrin for cravings, but patient declined.  Ear Pain Reports left ear pain for the past two weeks. Previous history of blood in ears with unknown cause. -Prescribe antibiotic ear drops and monitor for improvement. -If no improvement, refer  to Ear, Nose, and Throat specialist in [place].  Medication Refills Reports needing refills on Seroquel, Gabapentin, and Duloxetine. -Send 90-day supply of Seroquel, Gabapentin, Duloxetine, and Nicotine patches to [facility].  Follow-up Plan to see patient after discharge from sober living program in January for labs and further management.        Follow Up Instructions: 6 months   I discussed the assessment and treatment plan with the patient. The patient was provided an opportunity to ask questions and all were answered. The patient agreed with the plan and demonstrated an understanding of the instructions.   The patient was advised to call back or seek an in-person evaluation if the symptoms worsen or if the condition fails to improve as anticipated.     I provided 21 minutes total of Telehealth time during this encounter including median intraservice time, reviewing previous notes, investigations, ordering medications, medical decision making, coordinating care and patient verbalized understanding at the end of the visit.     Hoy Register, MD, FAAFP. [facility] [place], [place] [phone]   10/28/2022, 8:40 AM

## 2022-11-10 ENCOUNTER — Encounter: Payer: Self-pay | Admitting: Emergency Medicine

## 2022-11-10 ENCOUNTER — Other Ambulatory Visit: Payer: Self-pay

## 2022-11-10 ENCOUNTER — Ambulatory Visit
Admission: EM | Admit: 2022-11-10 | Discharge: 2022-11-10 | Disposition: A | Discharge status: Home / Self Care | Payer: Medicaid Other | Attending: Family Medicine | Admitting: Family Medicine

## 2022-11-10 DIAGNOSIS — J069 Acute upper respiratory infection, unspecified: Secondary | ICD-10-CM | POA: Diagnosis present

## 2022-11-10 DIAGNOSIS — Z20822 Contact with and (suspected) exposure to covid-19: Secondary | ICD-10-CM | POA: Insufficient documentation

## 2022-11-10 DIAGNOSIS — H60502 Unspecified acute noninfective otitis externa, left ear: Secondary | ICD-10-CM | POA: Insufficient documentation

## 2022-11-10 NOTE — Discharge Instructions (Signed)
Taking using your eardrops to the left ear and you may also apply some Aquaphor or hydrocortisone to soothe topically.  I suspect you have COVID, your results will be available tomorrow and someone will reach out to discuss antiviral medication if positive.  In the meantime, take DayQuil, NyQuil, Flonase, ibuprofen and Tylenol as needed.  Return for worsening symptoms.

## 2022-11-10 NOTE — ED Triage Notes (Addendum)
Pt reports generalized body aches, sore throat since this am. Pt reports headache but reports took migraine med for that and reports that pain has improved. Pt reports was exposed to covid last week.   Pt also reports left ear pain. Seen for similar a few weeks ago and reports "ear drops are not helping."

## 2022-11-10 NOTE — ED Provider Notes (Signed)
[facility]    CSN: [id number] Arrival date & time: 11/10/22  0846      History   Chief Complaint Chief Complaint  Patient presents with   Generalized Body Aches    HPI Jimmy [name] is a 32 y.o. male.   Presenting today with 1 day history of sore throat, generalized bodyaches, chills, congestion, cough.  Denies chest pain, shortness of breath, abdominal pain, nausea vomiting or diarrhea.  So for now try anything over-the-counter for symptoms.  Recent exposures to COVID.  Also complaining of ongoing left ear canal pain.  Had a virtual visit with PCP several weeks ago and given Ciprodex drops but states these are not solving the problem.  Denies drainage, muffled hearing, fever.    Past Medical History:  Diagnosis Date   ADHD (attention deficit hyperactivity disorder)    Anxiety    Arthritis    Cancer (HCC)    skin cancer   Chest pain 08/2018   GERD (gastroesophageal reflux disease)    no meds   History of COVID-19 04/05/2020   Migraine    migraines due to pinched nerve   Pinched nerve in neck    Scoliosis    Seizure [facility])     Patient Active Problem List   Diagnosis Date Noted   Nicotine dependence 10/28/2022   Bipolar 1 disorder (HCC) 05/07/2022   Chronic left shoulder pain 09/13/2021   History of Abnormal LFTs (liver function tests) 12/30/2019   Abnormal liver ultrasound 12/30/2019   Fatty liver 12/30/2019   Right flank pain 12/30/2019   Abdominal wall pain 12/30/2019   Gastroesophageal reflux disease without esophagitis 12/30/2019   Superior labrum anterior-to-posterior (SLAP) tear of left shoulder 10/01/2019   Carpal tunnel syndrome 09/08/2019   Bursitis of shoulder, right 08/31/2019   Incisional hernia, without obstruction or gangrene 05/20/2018   Opiate dependence (HCC) 08/02/2013    Past Surgical History:  Procedure Laterality Date   BICEPT TENODESIS Left 06/21/2020   Procedure: BICEPS TENODESIS TENOLYSIS;  Surgeon: Tarry Kos, MD;   Location: [facility];  Service: Orthopedics;  Laterality: Left;   ESOPHAGUS SURGERY     as a baby   PILONIDAL CYST EXCISION N/A 01/04/2022   Procedure: TREPHINATION OF PILONIDAL CYST;  Surgeon: Fritzi Mandes, MD;  Location: [facility];  Service: General;  Laterality: N/A;  sacrum   SHOULDER ARTHROSCOPY WITH BICEPS TENDON REPAIR Left 03/01/2020   Procedure: LEFT SHOULDER ARTHROSCOPY WITH BICEPS TENODESIS;  Surgeon: Tarry Kos, MD;  Location: [facility];  Service: Orthopedics;  Laterality: Left;   SHOULDER ARTHROSCOPY WITH SUBACROMIAL DECOMPRESSION Right 09/01/2019   Procedure: RIGHT SHOULDER ARTHROSCOPY WITH EXTENSIVE DEBRIDEMENT AND SUBACROMIAL DECOMPRESSION;  Surgeon: Tarry Kos, MD;  Location: [facility];  Service: Orthopedics;  Laterality: Right;   VENTRAL HERNIA REPAIR N/A 08/31/2018   Procedure: HERNIA REPAIR VENTRAL ADULT;  Surgeon: Henrene Dodge, MD;  Location: [facility];  Service: General;  Laterality: N/A;       Home Medications    Prior to Admission medications   Medication Sig Start Date End Date Taking? Authorizing Provider  AIMOVIG 140 MG/ML SOAJ Inject into the skin every 30 (thirty) days. Patient not taking: Reported on 11/10/2022 08/09/21   [provider]  ciprofloxacin-dexamethasone (CIPRODEX) OTIC suspension Place 4 drops into the left ear 2 (two) times daily. 10/28/22   Hoy Register, MD  DULoxetine (CYMBALTA) 60 MG capsule Take 1 capsule (60 mg total) by mouth  daily. 10/28/22   Hoy Register, MD  gabapentin (NEURONTIN) 400 MG capsule Take 1 capsule (400 mg total) by mouth 2 (two) times daily. 10/28/22   Hoy Register, MD  Gauze Pads & Dressings (GAUZE DRESSING) 4"X4" PADS 4 each by Does not apply route daily at 6 (six) AM. 01/04/22   Fritzi Mandes, MD  meloxicam (MOBIC) 15 MG tablet Take 1 tablet (15 mg total) by mouth daily. 04/08/22   Richardean Sale, DO  methocarbamol (ROBAXIN) 500 MG  tablet Take 2 tablets (1,000 mg total) by mouth 2 (two) times daily. 03/26/22   Hoy Register, MD  nicotine (NICODERM CQ) 14 mg/24hr patch Place 1 patch (14 mg total) onto the skin daily. 10/28/22   Hoy Register, MD  QUEtiapine (SEROQUEL) 100 MG tablet Take 1 tablet (100 mg total) by mouth at bedtime. 10/28/22   Hoy Register, MD  rizatriptan (MAXALT) 10 MG tablet Take 1 tablet (10 mg total) by mouth as needed for migraine. May repeat in 2 hours if needed 10/28/22   Hoy Register, MD  famotidine (PEPCID) 40 MG tablet Take 1 tablet (40 mg total) by mouth daily. 08/26/18 12/28/19  Elvina Sidle, MD    Family History Family History  Problem Relation Age of Onset   Hypertension Mother    Diabetes Mother    Kidney cancer Mother    Diabetes Maternal Aunt    Diabetes Maternal Grandmother    Cancer Maternal Grandmother        unknown type. doing chemo   Cancer Maternal Grandfather    Stomach cancer Neg Hx    Pancreatic cancer Neg Hx    Esophageal cancer Neg Hx    Liver disease Neg Hx    Colon cancer Neg Hx    Rectal cancer Neg Hx     Social History Social History   Tobacco Use   Smoking status: Every Day    Current packs/day: 0.75    Average packs/day: 0.8 packs/day for 15.0 years (11.3 ttl pk-yrs)    Types: Cigarettes    Passive exposure: Never   Smokeless tobacco: Former  Building services engineer status: Some Days  Substance Use Topics   Alcohol use: Not Currently   Drug use: Not Currently     Allergies   Cyclobenzaprine, Depakote [divalproex sodium], Ritalin [methylphenidate hcl], Naproxen, Other, and Tramadol   Review of Systems Review of Systems PER HPI  Physical Exam Triage Vital Signs ED Triage Vitals  Encounter Vitals Group     BP 11/10/22 1014 138/87     Systolic BP Percentile --      Diastolic BP Percentile --      Pulse Rate 11/10/22 1014 (!) 104     Resp 11/10/22 1014 20     Temp 11/10/22 1014 98 F (36.7 C)     Temp Source 11/10/22 1014 Oral      SpO2 11/10/22 1014 97 %     Weight --      Height --      Head Circumference --      Peak Flow --      Pain Score 11/10/22 1012 8     Pain Loc --      Pain Education --      Exclude from Growth Chart --    No data found.  Updated Vital Signs BP 138/87 (BP Location: Right Arm)   Pulse (!) 104   Temp 98 F (36.7 C) (Oral)   Resp 20   SpO2 97%  Visual Acuity Right Eye Distance:   Left Eye Distance:   Bilateral Distance:    Right Eye Near:   Left Eye Near:    Bilateral Near:     Physical Exam Vitals and nursing note reviewed.  Constitutional:      Appearance: He is well-developed.  HENT:     Head: Atraumatic.     Right Ear: Tympanic membrane and external ear normal.     Left Ear: Tympanic membrane normal.     Ears:     Comments: Left ear canal dry, flaking, erythematous    Nose: Rhinorrhea present.     Mouth/Throat:     Pharynx: Posterior oropharyngeal erythema present. No oropharyngeal exudate.  Eyes:     Conjunctiva/sclera: Conjunctivae normal.     Pupils: Pupils are equal, round, and reactive to light.  Cardiovascular:     Rate and Rhythm: Normal rate and regular rhythm.  Pulmonary:     Effort: Pulmonary effort is normal. No respiratory distress.     Breath sounds: No wheezing or rales.  Musculoskeletal:        General: Normal range of motion.     Cervical back: Normal range of motion and neck supple.  Lymphadenopathy:     Cervical: No cervical adenopathy.  Skin:    General: Skin is warm and dry.  Neurological:     Mental Status: He is alert and oriented to person, place, and time.  Psychiatric:        Behavior: Behavior normal.      UC Treatments / Results  Labs (all labs ordered are listed, but only abnormal results are displayed) Labs Reviewed  SARS CORONAVIRUS 2 (TAT 6-24 HRS)    EKG   Radiology No results found.  Procedures Procedures (including critical care time)  Medications Ordered in UC Medications - No data to  display  Initial Impression / Assessment and Plan / UC Course  I have reviewed the triage vital signs and the nursing notes.  Pertinent labs & imaging results that were available during my care of the patient were reviewed by me and considered in my medical decision making (see chart for details).     Suspect viral respiratory infection, likely COVID-19.  COVID testing pending, good candidate for Paxlovid if positive.  Discussed reported over-the-counter medications, home care additionally.  Regarding the ear canal irritation, continue the Ciprodex drops given by primary care and may apply Aquaphor or hydrocortisone ointment carefully with a Q-tip to the area for topical protection.  Return for worsening symptoms.  Final Clinical Impressions(s) / UC Diagnoses   Final diagnoses:  Exposure to COVID-19 virus  Viral URI with cough  Acute otitis externa of left ear, unspecified type     Discharge Instructions      Taking using your eardrops to the left ear and you may also apply some Aquaphor or hydrocortisone to soothe topically.  I suspect you have COVID, your results will be available tomorrow and someone will reach out to discuss antiviral medication if positive.  In the meantime, take DayQuil, NyQuil, Flonase, ibuprofen and Tylenol as needed.  Return for worsening symptoms.    ED Prescriptions   None    PDMP not reviewed this encounter.   Particia Nearing, [place] 11/10/22 1101

## 2022-11-11 ENCOUNTER — Telehealth: Payer: Self-pay

## 2022-11-11 ENCOUNTER — Ambulatory Visit: Payer: Self-pay | Admitting: *Deleted

## 2022-11-11 DIAGNOSIS — U071 COVID-19: Secondary | ICD-10-CM

## 2022-11-11 LAB — SARS CORONAVIRUS 2 (TAT 6-24 HRS): SARS Coronavirus 2: POSITIVE — AB

## 2022-11-11 MED ORDER — PAXLOVID (300/100) 20 X 150 MG & 10 X 100MG PO TBPK
3.0000 | ORAL_TABLET | Freq: Two times a day (BID) | ORAL | 0 refills | Status: DC
Start: 2022-11-11 — End: 2022-11-11

## 2022-11-11 MED ORDER — PAXLOVID (300/100) 20 X 150 MG & 10 X 100MG PO TBPK
3.0000 | ORAL_TABLET | Freq: Two times a day (BID) | ORAL | 0 refills | Status: AC
Start: 2022-11-11 — End: 2022-11-16

## 2022-11-11 NOTE — Telephone Encounter (Signed)
Pt called stating he was aware of his positive test results for COVID was told that if he was positive he would be a good candidate for PAXLOVID pt wanting meds sent in, and would also like to know what can be done about his sore throat.   Provider is aware and has given verbal consent to send in medication

## 2022-11-11 NOTE — Telephone Encounter (Signed)
Reason for Disposition . COVID-19 Disease, questions about  Answer Assessment - Initial Assessment Questions 1. COVID-19 DIAGNOSIS: "How do you know that you have COVID?" (e.g., positive lab test or self-test, diagnosed by doctor or NP/PA, symptoms after exposure).     Yesterday- tested + COVID at UC 2. COVID-19 EXPOSURE: "Was there any known exposure to COVID before the symptoms began?" CDC Definition of close contact: within 6 feet (2 meters) for a total of 15 minutes or more over a 24-hour period.      unsure 3. ONSET: "When did the COVID-19 symptoms start?"      Sunday 4. WORST SYMPTOM: "What is your worst symptom?" (e.g., cough, fever, shortness of breath, muscle aches)     Body ache, feels hot 5. COUGH: "Do you have a cough?" If Yes, ask: "How bad is the cough?"       No- little more sore throat 6. FEVER: "Do you have a fever?" If Yes, ask: "What is your temperature, how was it measured, and when did it start?"     Feels hot- like burning 7. RESPIRATORY STATUS: "Describe your breathing?" (e.g., normal; shortness of breath, wheezing, unable to speak)      Feels he is breathing harder- but not SOB 8. BETTER-SAME-WORSE: "Are you getting better, staying the same or getting worse compared to yesterday?"  If getting worse, ask, "In what way?"     Same-worse- no appetite  9. OTHER SYMPTOMS: "Do you have any other symptoms?"  (e.g., chills, fatigue, headache, loss of smell or taste, muscle pain, sore throat)     Headache, muscle pain 10. HIGH RISK DISEASE: "Do you have any chronic medical problems?" (e.g., asthma, heart or lung disease, weak immune system, obesity, etc.)       no  Protocols used: Coronavirus (COVID-19) Diagnosed or Suspected-A-AH

## 2022-11-11 NOTE — Telephone Encounter (Signed)
  Chief Complaint: + COVID- follow up question for provider Symptoms: severe migraine headache with COVID- patient is using ibuprofen, has been prescribed Paxlovid- just waiting for pharmacy to get it in tomorrow to start. Patient has been using migraine medication- but states it is only effect for 30 minutes and migraine comes back.   Frequency: symptoms started Sunday Pertinent Negatives: Patient denies SOB, chest pain Disposition: [] ED /[] Urgent Care (no appt availability in office) / [] Appointment(In office/virtual)/ []  [facility]/ [] Home Care/ [] Refused Recommended Disposition /[] [facility]/ [x]  Follow-up with PCP Additional Notes: Patient has recent diagnosis of COVID. COVID is making his migraines really bad- does provider have any advice?  rizatriptan (MAXALT) 10 MG tablet - only controlling for about 30 minutes and headache comes back. Should antiviral help this symptom? Advised Tylenol/ibuprofen, hydrate- although is having a hard time drinking/eating, cool pack to head, decrease light.

## 2022-11-11 NOTE — Telephone Encounter (Signed)
I would recommend he pick up his Paxlovid and start taking it.  He can also use Excedrin migraines in addition.  I agree with RN recommendations as well.

## 2022-11-12 ENCOUNTER — Encounter: Payer: Self-pay | Admitting: *Deleted

## 2022-11-12 NOTE — Telephone Encounter (Signed)
Message sent via MyChart:  Good morning. We received your message on yesterday:  Patient has recent diagnosis of COVID. COVID is making his migraines really bad- does provider have any advice?  rizatriptan (MAXALT) 10 MG tablet - only controlling for about 30 minutes and headache comes back. Should antiviral help this symptom? Advised Tylenol/ibuprofen, hydrate- although is having a hard time drinking/eating, cool pack to head, decrease light.    Message from Dr. Alvis Lemmings:  I would recommend he pick up his Paxlovid and start taking it. He can also use Excedrin migraines in addition. I agree with RN recommendations as well.    If you have additional questions please call the office at [phone]

## 2022-12-09 ENCOUNTER — Telehealth: Payer: Self-pay

## 2022-12-09 ENCOUNTER — Other Ambulatory Visit: Payer: Self-pay | Admitting: Family Medicine

## 2022-12-09 NOTE — Telephone Encounter (Signed)
Copied from CRM [id number]. Topic: Referral - Request for Referral >> Dec 09, 2022  9:57 AM Patsy Lager T wrote: Has patient seen PCP for this complaint? Yes.   Referral for which specialty: Psychiatrist Preferred provider/office: unknown Reason for referral: ADHA/Bipolar

## 2022-12-09 NOTE — Telephone Encounter (Signed)
Attempted to contact patient and schedule an appointment with PCP before lab work can be done.

## 2022-12-09 NOTE — Telephone Encounter (Signed)
Copied from CRM [id number]. Topic: General - Inquiry >> Dec 09, 2022  9:54 AM Patsy Lager T wrote: Reason for CRM: patient called stated he needs blood work and would like for provider to put in orders and schedule him an appt

## 2022-12-09 NOTE — Telephone Encounter (Signed)
Patient called stated he is smoking more now than before. Patient is requesting nicotine (NICODERM CQ) 21 mg/24hr patch. Please f/u with patient.

## 2022-12-09 NOTE — Telephone Encounter (Signed)
Copied from CRM [id number]. Topic: Referral - Request for Referral >> Dec 09, 2022  9:56 AM Patsy Lager T wrote: Has patient seen PCP for this complaint? Yes.   Referral for which specialty: Neurologist Preferred provider/office: unknown Reason for referral: migraine

## 2022-12-10 MED ORDER — NICOTINE 14 MG/24HR TD PT24: 14.0000 mg | MEDICATED_PATCH | Freq: Every day | TRANSDERMAL | 2 refills | Status: DC

## 2022-12-10 NOTE — Telephone Encounter (Signed)
Requested Prescriptions  Pending Prescriptions Disp Refills   nicotine (NICODERM CQ) 14 mg/24hr patch 28 patch 2    Sig: Place 1 patch (14 mg total) onto the skin daily.     Psychiatry:  Drug Dependence Therapy Passed - 12/09/2022 10:44 AM      Passed - Valid encounter within last 12 months    Recent Outpatient Visits           1 month ago Left ear pain   [facility] [place], Odette Horns, MD   9 months ago Annual physical exam   [facility] Hoy Register, MD   1 year ago Cervical radiculopathy   [facility] Claiborne Rigg, NP   1 year ago Suspected sleep apnea   [facility] Hoy Register, MD   1 year ago Other chronic pain   [place] [facility] Hoy Register, MD

## 2023-01-15 ENCOUNTER — Other Ambulatory Visit: Payer: Self-pay | Admitting: Family Medicine

## 2023-01-15 ENCOUNTER — Encounter: Payer: Self-pay | Admitting: Family Medicine

## 2023-01-15 MED ORDER — NICOTINE 21 MG/24HR TD PT24: 21.0000 mg | MEDICATED_PATCH | Freq: Every day | TRANSDERMAL | 2 refills | Status: DC

## 2023-01-22 ENCOUNTER — Ambulatory Visit (HOSPITAL_COMMUNITY)
Admission: EM | Admit: 2023-01-22 | Discharge: 2023-01-22 | Disposition: A | Discharge status: Home / Self Care | Payer: MEDICAID | Attending: Family Medicine | Admitting: Family Medicine

## 2023-01-22 ENCOUNTER — Encounter (HOSPITAL_COMMUNITY): Payer: Self-pay

## 2023-01-22 DIAGNOSIS — L03012 Cellulitis of left finger: Secondary | ICD-10-CM

## 2023-01-22 MED ORDER — IBUPROFEN 800 MG PO TABS: 800.0000 mg | ORAL_TABLET | Freq: Three times a day (TID) | ORAL | 0 refills | Status: DC

## 2023-01-22 MED ORDER — DOXYCYCLINE HYCLATE 100 MG PO CAPS: 100.0000 mg | ORAL_CAPSULE | Freq: Two times a day (BID) | ORAL | 0 refills | Status: DC

## 2023-01-22 NOTE — ED Triage Notes (Signed)
Here for left middle finger swelling and pain x 3 days. No known injury. Pain 10/10

## 2023-01-23 ENCOUNTER — Telehealth (HOSPITAL_COMMUNITY): Payer: Self-pay

## 2023-01-23 MED ORDER — DOXYCYCLINE HYCLATE 100 MG PO CAPS: 100.0000 mg | ORAL_CAPSULE | Freq: Two times a day (BID) | ORAL | 0 refills | Status: DC

## 2023-01-23 MED ORDER — IBUPROFEN 800 MG PO TABS: 800.0000 mg | ORAL_TABLET | Freq: Three times a day (TID) | ORAL | 0 refills | Status: DC

## 2023-01-23 NOTE — Telephone Encounter (Signed)
Received call from patient. Medication was sent to the wrong pharmacy. Will send to [facility] on [address].

## 2023-01-23 NOTE — ED Provider Notes (Signed)
[facility]   [id number] 01/22/23 Arrival Time: 1733  ASSESSMENT & PLAN:  1. Paronychia of left middle finger     Incision and Drainage Procedure Note  Anesthesia: PainEase topical spray  Procedure Details  The procedure, risks and complications have been discussed in detail (including, but not limited to pain and bleeding) with the patient.  The skin induration was prepped and draped in the usual fashion. After adequate local anesthesia, I&D with a #11 blade was performed on the left finger nailfold with purulent drainage.  EBL: minimal Drains: none Packing: n/a Condition: Tolerated procedure well Complications: none.  Meds ordered this encounter  Medications   doxycycline (VIBRAMYCIN) 100 MG capsule    Sig: Take 1 capsule (100 mg total) by mouth 2 (two) times daily.    Dispense:  14 capsule    Refill:  0   ibuprofen (ADVIL) 800 MG tablet    Sig: Take 1 tablet (800 mg total) by mouth 3 (three) times daily with meals.    Dispense:  21 tablet    Refill:  0    Wound care instructions discussed and given in written format. To return in 48 hours for wound check.  Finish all antibiotics. OTC analgesics as needed.  Reviewed expectations re: course of current medical issues. Questions answered. Outlined signs and symptoms indicating need for more acute intervention. Patient verbalized understanding. After Visit Summary given.   SUBJECTIVE:  Jimmy [name] is a 32 y.o. male who presents with a possible infection of his left 3rd finger. Onset gradual, few days ago; increasing swelling/pain. Denies fever. No tx PTA.   OBJECTIVE:  Vitals:   01/22/23 1822  BP: 139/88  Pulse: 67  Resp: 18  Temp: 98.8 F (37.1 C)  TempSrc: Oral  SpO2: 95%     General appearance: alert; no distress LUE: approx 1 cm induration of his central nailfold of left 3rd finger; tender to touch; no active drainage or bleeding Psychological: alert and cooperative; normal mood  and affect  Allergies  Allergen Reactions   Cyclobenzaprine Hives and Nausea Only   Depakote [Divalproex Sodium] Other (See Comments)    "made him crazy"   Ritalin [Methylphenidate Hcl] Other (See Comments)    Increased hyper activeness    Naproxen Nausea Only    Acid reflux   Other Other (See Comments)    [facility]-pt states skin became reddened and he tasted metal (08-2018)   Tramadol Other (See Comments)    Acid reflux    Past Medical History:  Diagnosis Date   ADHD (attention deficit hyperactivity disorder)    Anxiety    Arthritis    Cancer (HCC)    skin cancer   Chest pain 08/2018   GERD (gastroesophageal reflux disease)    no meds   History of COVID-19 04/05/2020   Migraine    migraines due to pinched nerve   Pinched nerve in neck    Scoliosis    Seizure (HCC)    Social History   Socioeconomic History   Marital status: Married    Spouse name: Not on file   Number of children: 3   Years of education: Not on file   Highest education level: Not on file  Occupational History   Occupation: Holiday representative work  Tobacco Use   Smoking status: Every Day    Current packs/day: 0.75    Average packs/day: 0.8 packs/day for 15.0 years (11.3 ttl pk-yrs)    Types: Cigarettes    Passive exposure: Never  Smokeless tobacco: Former  Building services engineer status: Some Days  Substance and Sexual Activity   Alcohol use: Not Currently   Drug use: Not Currently   Sexual activity: Yes  Other Topics Concern   Not on file  Social History Narrative   Right handed   One story home    Drinks caffeine    Social Determinants of Health   Financial Resource Strain: Not on file  Food Insecurity: Not on file  Transportation Needs: Not on file  Physical Activity: Not on file  Stress: Not on file  Social Connections: Unknown (08/28/2021)   [facility]   Social Network    Social Network: Not on file   Family History  Problem Relation Age of Onset    Hypertension Mother    Diabetes Mother    Kidney cancer Mother    Diabetes Maternal Aunt    Diabetes Maternal Grandmother    Cancer Maternal Grandmother        unknown type. doing chemo   Cancer Maternal Grandfather    Stomach cancer Neg Hx    Pancreatic cancer Neg Hx    Esophageal cancer Neg Hx    Liver disease Neg Hx    Colon cancer Neg Hx    Rectal cancer Neg Hx    Past Surgical History:  Procedure Laterality Date   BICEPT TENODESIS Left 06/21/2020   Procedure: BICEPS TENODESIS TENOLYSIS;  Surgeon: Tarry Kos, MD;  Location: [facility];  Service: Orthopedics;  Laterality: Left;   ESOPHAGUS SURGERY     as a baby   PILONIDAL CYST EXCISION N/A 01/04/2022   Procedure: TREPHINATION OF PILONIDAL CYST;  Surgeon: Fritzi Mandes, MD;  Location: [facility];  Service: General;  Laterality: N/A;  sacrum   SHOULDER ARTHROSCOPY WITH BICEPS TENDON REPAIR Left 03/01/2020   Procedure: LEFT SHOULDER ARTHROSCOPY WITH BICEPS TENODESIS;  Surgeon: Tarry Kos, MD;  Location: [facility];  Service: Orthopedics;  Laterality: Left;   SHOULDER ARTHROSCOPY WITH SUBACROMIAL DECOMPRESSION Right 09/01/2019   Procedure: RIGHT SHOULDER ARTHROSCOPY WITH EXTENSIVE DEBRIDEMENT AND SUBACROMIAL DECOMPRESSION;  Surgeon: Tarry Kos, MD;  Location: [facility];  Service: Orthopedics;  Laterality: Right;   VENTRAL HERNIA REPAIR N/A 08/31/2018   Procedure: HERNIA REPAIR VENTRAL ADULT;  Surgeon: Henrene Dodge, MD;  Location: [facility];  Service: General;  Laterality: Vertis Kelch, MD 01/23/23 1023

## 2023-01-24 ENCOUNTER — Encounter: Payer: Self-pay | Admitting: Family Medicine

## 2023-01-24 ENCOUNTER — Ambulatory Visit: Payer: Self-pay

## 2023-01-24 NOTE — Telephone Encounter (Signed)
Summary: swollen middle finger   Patient called in stated his left hand middle finger is swollen and throbbing. Please f/u with patient     Called pt - left message on machine to return our call.

## 2023-01-24 NOTE — Telephone Encounter (Signed)
Chief Complaint: Finger Pain Symptoms: Swelling, redness, pain 10/10, getting worse, draining pus Frequency: Constant Pertinent Negatives: Patient denies fever, chills, nausea, vomiting  Disposition: [] ED /[x] Urgent Care (no appt availability in office) / [] Appointment(In office/virtual)/ []  [facility]/ [] Home Care/ [] Refused Recommended Disposition /[] [facility]/ []  Follow-up with PCP Additional Notes: Patient stated he went to Urgent care  01/22/23 and was given antibiotics for paronychia of the left middle finger. Patient stated the provider drained the fluid from the finger at the visit. He reports taking 2 doses of the antibiotic so far but the finger is getting worse patient stated the swelling started by the finger nail and now it is moving up the finger and most of the finger is swollen, pain is 10/10 on pain scale and it is still draining pus. Patient states he feels like the finger is going bust from the throbbing pain. Patient stated he has been taking Ibuprofen but it is not helpful. Care advise given and patient stated he would go back to Urgent care he does not want loss his finger.  Summary: swollen middle finger   Patient called in stated his left hand middle finger is swollen and throbbing. Please f/u with patient     Reason for Disposition  [1] Looks infected (spreading redness, red streak, pus) AND [2] large red area (> 2 inches or 5 cm, or entire finger)  Answer Assessment - Initial Assessment Questions 1. ONSET: "When did the pain start?"      Sunday  2. LOCATION and RADIATION: "Where is the pain located?"  (e.g., fingertip, around nail, joint, entire  finger)      Left hand middle finger  3. SEVERITY: "How bad is the pain?" "What does it keep you from doing?"   (Scale 1-10; or mild, moderate, severe)  - MILD (1-3): doesn\'t interfere with normal activities.   - MODERATE (4-7): interferes with normal activities or awakens from sleep.  - SEVERE  (8-10): excruciating pain, unable to hold a glass of water or bend finger even a little.     10 /10 4. APPEARANCE: "What does the finger look like?" (e.g., redness, swelling, bruising, pallor)     Swelling, red, white pus  5. WORK OR EXERCISE: "Has there been any recent work or exercise that involved this part (i.e., fingers or hand) of the body?"     No  6. CAUSE: "What do you think is causing the pain?"     I think it is infected  7. AGGRAVATING FACTORS: "What makes the pain worse?" (e.g., using computer)     Hand movement  8. OTHER SYMPTOMS: "Do you have any other symptoms?" (e.g., fever, neck pain, numbness)     White spots on the finger  Protocols used: Finger Pain-A-AH

## 2023-01-25 ENCOUNTER — Ambulatory Visit (HOSPITAL_COMMUNITY)
Admission: EM | Admit: 2023-01-25 | Discharge: 2023-01-25 | Disposition: A | Discharge status: Home / Self Care | Payer: MEDICAID

## 2023-01-25 ENCOUNTER — Encounter (HOSPITAL_COMMUNITY): Payer: Self-pay

## 2023-01-25 ENCOUNTER — Emergency Department (HOSPITAL_COMMUNITY)
Admission: EM | Admit: 2023-01-25 | Discharge: 2023-01-26 | Disposition: A | Discharge status: Home / Self Care | Payer: MEDICAID | Attending: Emergency Medicine | Admitting: Emergency Medicine

## 2023-01-25 ENCOUNTER — Other Ambulatory Visit: Payer: Self-pay

## 2023-01-25 DIAGNOSIS — L03012 Cellulitis of left finger: Secondary | ICD-10-CM | POA: Diagnosis present

## 2023-01-25 NOTE — ED Triage Notes (Signed)
Pt presents with left index finger infection x one week (per pt). Pt is currently on day three of antibiotics prescribed, also taking Ibuprofen, last dose this AM. Pt currently denies fevers.  Redness, swelling, and pus under the skin present on RN assessment. Pt states it has been I&D  once and never healed.

## 2023-01-25 NOTE — Discharge Instructions (Signed)
You have failed out patient antibiotics with worsening infection and cellulitis area.

## 2023-01-25 NOTE — ED Provider Notes (Signed)
Patient was seen here in the urgent care on Wednesday and started antibiotics for cellulitis and infection of the third finger left hand.  Patient reports he has been taking doxycycline as ordered daily.  But the redness pain and swelling with has increased.  There is noted pus pocket at the base of the fingernail.  He is advised to go over to the ER for further evaluation as he will need further testing and possible IV antibiotics.  He is agreeable to plan of care.   Nelda Marseille, NP 01/25/23 1640

## 2023-01-25 NOTE — ED Triage Notes (Signed)
Pt presents with left index finger infection x one week (per pt). Pt is currently on day three of antibiotics prescribed, also taking Ibuprofen, last dose this AM. Pt currently denies fevers. Pt states "it looks like it has spread, it was originally close to my fingertip, but now it has started to do down my finger." Redness, swelling, and pus under the skin present on RN assessment.

## 2023-01-25 NOTE — ED Notes (Signed)
Patient is being discharged from the Urgent Care and sent to the Emergency Department via private vehicle . Per Blitch NP, patient is in need of higher level of care due to left index finger infection. Patient is aware and verbalizes understanding of plan of care.  Vitals:   01/25/23 1612  BP: (!) 146/72  Pulse: 83  Resp: 18  Temp: 98 F (36.7 C)  SpO2: 97%

## 2023-01-26 MED ORDER — BUTAMBEN-TETRACAINE-BENZOCAINE 2-2-14 % EX AERO
1.0000 | INHALATION_SPRAY | Freq: Once | CUTANEOUS | Status: AC
  Administered 2023-01-26: 1 via TOPICAL
  Filled 2023-01-26: qty 20

## 2023-01-26 NOTE — ED Notes (Signed)
AVS provided by edp were discussed with pt. Pt verbalized understanding with no additional questions at this time.

## 2023-01-26 NOTE — ED Provider Notes (Signed)
[place] EMERGENCY DEPARTMENT AT [facility] Provider Note   CSN: [id number] Arrival date & time: 01/25/23  1843     History  Chief Complaint  Patient presents with   Wound Infection    Left middle finger    Jimmy [name] is a 32 y.o. male.  Patient with paronychia of the left index finger reportedly present for 1 week presents to the emergency department complaining of continued swelling and pain.  Patient was seen at an urgent care on Wednesday of this week where an I&D was performed.  He was prescribed doxycycline.  He states there was never an improvement of swelling and he continues to have discomfort.  The patient has a picture of the swelling on Wednesday and it has progressed as of today.  He denies fever, nausea, vomiting, range of motion issues.  Past medical history significant for opiate dependence, GERD, bipolar 1 disorder  HPI     Home Medications Prior to Admission medications   Medication Sig Start Date End Date Taking? Authorizing Provider  AIMOVIG 140 MG/ML SOAJ Inject into the skin every 30 (thirty) days. Patient not taking: Reported on 11/10/2022 08/09/21   [provider]  ciprofloxacin-dexamethasone (CIPRODEX) OTIC suspension Place 4 drops into the left ear 2 (two) times daily. 10/28/22   Hoy Register, MD  doxycycline (VIBRAMYCIN) 100 MG capsule Take 1 capsule (100 mg total) by mouth 2 (two) times daily. 01/23/23   Mardella Layman, MD  DULoxetine (CYMBALTA) 60 MG capsule Take 1 capsule (60 mg total) by mouth daily. 10/28/22   Hoy Register, MD  gabapentin (NEURONTIN) 400 MG capsule Take 1 capsule (400 mg total) by mouth 2 (two) times daily. 10/28/22   Hoy Register, MD  Gauze Pads & Dressings (GAUZE DRESSING) 4"X4" PADS 4 each by Does not apply route daily at 6 (six) AM. 01/04/22   Fritzi Mandes, MD  ibuprofen (ADVIL) 800 MG tablet Take 1 tablet (800 mg total) by mouth 3 (three) times daily with meals. 01/23/23   Mardella Layman, MD   methocarbamol (ROBAXIN) 500 MG tablet Take 2 tablets (1,000 mg total) by mouth 2 (two) times daily. 03/26/22   Hoy Register, MD  nicotine (NICODERM CQ) 21 mg/24hr patch Place 1 patch (21 mg total) onto the skin daily. 01/15/23   Hoy Register, MD  QUEtiapine (SEROQUEL) 100 MG tablet Take 1 tablet (100 mg total) by mouth at bedtime. 10/28/22   Hoy Register, MD  rizatriptan (MAXALT) 10 MG tablet Take 1 tablet (10 mg total) by mouth as needed for migraine. May repeat in 2 hours if needed 10/28/22   Hoy Register, MD  famotidine (PEPCID) 40 MG tablet Take 1 tablet (40 mg total) by mouth daily. 08/26/18 12/28/19  Elvina Sidle, MD      Allergies    Cyclobenzaprine, Depakote [divalproex sodium], Ritalin [methylphenidate hcl], Fentanyl and related, Hydrocodone, Morphine and codeine, Oxycodone, Naproxen, Other, and Tramadol    Review of Systems   Review of Systems  Physical Exam Updated Vital Signs BP 121/65 (BP Location: Right Arm)   Pulse (!) 57   Temp 97.7 F (36.5 C) (Oral)   Resp 15   Ht 5\' 4"  (1.626 m)   Wt 71.2 kg   SpO2 99%   BMI 26.94 kg/m  Physical Exam Vitals and nursing note reviewed.  HENT:     Head: Normocephalic and atraumatic.  Eyes:     Pupils: Pupils are equal, round, and reactive to light.  Pulmonary:  Effort: Pulmonary effort is normal. No respiratory distress.  Musculoskeletal:        General: No signs of injury.     Cervical back: Normal range of motion.  Skin:    General: Skin is dry.     Comments: Paronychia with pus noted on left index finger  Neurological:     Mental Status: He is alert.  Psychiatric:        Speech: Speech normal.        Behavior: Behavior normal.     ED Results / Procedures / Treatments   Labs (all labs ordered are listed, but only abnormal results are displayed) Labs Reviewed - No data to display  EKG None  Radiology No results found.  Procedures .Marland KitchenIncision and Drainage  Date/Time: 01/26/2023 5:07  AM  Performed by: Darrick Grinder, PA-C Authorized by: Darrick Grinder, PA-C   Consent:    Consent obtained:  Verbal   Consent given by:  Patient   Risks, benefits, and alternatives were discussed: yes     Risks discussed:  Bleeding, pain, incomplete drainage and infection Universal protocol:    Procedure explained and questions answered to patient or proxy's satisfaction: yes     Patient identity confirmed:  Verbally with patient Location:    Indications for incision and drainage: Paronychia.   Location:  Upper extremity   Upper extremity location:  Finger   Finger location:  L index finger Pre-procedure details:    Skin preparation:  Povidone-iodine Anesthesia:    Anesthesia method:  Topical application   Topical anesthesia: Cetacaine. Procedure type:    Complexity:  Simple Procedure details:    Incision types:  Stab incision   Wound management:  Irrigated with saline   Drainage:  Purulent and bloody   Drainage amount:  Moderate   Wound treatment:  Wound left open   Packing materials:  None Post-procedure details:    Procedure completion:  Tolerated well, no immediate complications     Medications Ordered in ED Medications  butamben-tetracaine-benzocaine (CETACAINE) spray 1 spray (1 spray Topical Given 01/26/23 0458)    ED Course/ Medical Decision Making/ A&P                                 Medical Decision Making Risk Prescription drug management.   Patient presents to the emergency room complaining of infection to the left index finger.  Differential includes paronychia, felon, others  Presentation is consistent with paronychia.  I&D performed at bedside with stab incision.  Significant amount of purulent drainage expelled from wound.  Patient tolerated procedure without difficulty.  Plan to discharge home with recommendations for continue doxycycline and return precautions.  Patient understands that if infection returns/does not improve over the next few  days he may need to repeat procedure, possible change in antibiotics.        Final Clinical Impression(s) / ED Diagnoses Final diagnoses:  Paronychia of finger of left hand    Rx / DC Orders ED Discharge Orders     None         [name] 01/26/23 9562    Shon Baton, MD 01/26/23 [phone]

## 2023-01-26 NOTE — Discharge Instructions (Addendum)
Please complete your course of doxycycline.  Follow-up as needed with primary care.  If the infection does not clear over the next few days or worsens you may need a different antibiotic or possibly another procedure.  Return to the emergency department as needed.

## 2023-01-27 NOTE — Telephone Encounter (Signed)
Call placed to patient unable to reach message left on VM.   

## 2023-01-31 NOTE — ED Provider Notes (Signed)
Patient currently on antibiotic for infection of the finger of left finger parenchymal .  He was started on antibiotic but infection is worse and is now spreading toward first distal interphalangeal joint.  He has antibiotic failure and may need more testing than we can provide to rule out osteomyelitis or joint infection.  We will discharge to ER for further work up.  He is agreeable to plan   Nelda Marseille, NP 01/31/23 2234

## 2023-02-25 ENCOUNTER — Ambulatory Visit (INDEPENDENT_AMBULATORY_CARE_PROVIDER_SITE_OTHER): Payer: MEDICAID | Admitting: Orthopaedic Surgery

## 2023-02-25 DIAGNOSIS — G8929 Other chronic pain: Secondary | ICD-10-CM

## 2023-02-25 DIAGNOSIS — M79602 Pain in left arm: Secondary | ICD-10-CM

## 2023-02-25 DIAGNOSIS — M25511 Pain in right shoulder: Secondary | ICD-10-CM | POA: Diagnosis not present

## 2023-02-25 NOTE — Progress Notes (Signed)
Office Visit Note   Patient: Jimmy [name]           Date of Birth: [birth year]           MRN: [id number] Visit Date: 02/25/2023              Requested by: Hoy Register, MD [address] [place] [address] [place],  [place] [address] PCP: Hoy Register, MD   Assessment & Plan: Visit Diagnoses:  1. Left arm pain   2. Chronic right shoulder pain     Plan: Impression is bilateral shoulder pain and overuse pain in the biceps.  He is status post left biceps tenodesis.  I think his symptoms are consistent with a crampy pain that patients often have with with his type of condition.  There is nothing to do about it other than modify activity and rest as needed.  Questions encouraged and answered.  Reassurance was provided.  Follow-up as needed.  Follow-Up Instructions: No follow-ups on file.   Orders:  No orders of the defined types were placed in this encounter.  No orders of the defined types were placed in this encounter.     Procedures: No procedures performed   Clinical Data: No additional findings.   Subjective: Chief Complaint  Patient presents with   Right Shoulder - Pain   Left Shoulder - Pain    HPI Patient is here to be evaluated for bilateral shoulder pain.  For the right shoulder he states that hurts occasionally with activity.  For the left shoulder is actually the anterior biceps that hurts with activity such as raking leaves.  Review of Systems  Constitutional: Negative.   HENT: Negative.    Eyes: Negative.   Respiratory: Negative.    Cardiovascular: Negative.   Gastrointestinal: Negative.   Endocrine: Negative.   Genitourinary: Negative.   Skin: Negative.   Allergic/Immunologic: Negative.   Neurological: Negative.   Hematological: Negative.   Psychiatric/Behavioral: Negative.    All other systems reviewed and are negative.    Objective: Vital Signs: There were no vitals taken for this visit.  Physical Exam Vitals and nursing note  reviewed.  Constitutional:      Appearance: He is well-developed.  HENT:     Head: Normocephalic and atraumatic.  Eyes:     Pupils: Pupils are equal, round, and reactive to light.  Pulmonary:     Effort: Pulmonary effort is normal.  Abdominal:     Palpations: Abdomen is soft.  Musculoskeletal:        General: Normal range of motion.     Cervical back: Neck supple.  Skin:    General: Skin is warm.  Neurological:     Mental Status: He is alert and oriented to person, place, and time.  Psychiatric:        Behavior: Behavior normal.        Thought Content: Thought content normal.        Judgment: Judgment normal.     Ortho Exam Exam of the right shoulder is unremarkable.  He has full range of motion and normal strength to manual muscle testing. Exam of the left shoulder is also unremarkable.  I cannot reproduce any pain in the muscle of the biceps. Specialty Comments:  No specialty comments available.  Imaging: No results found.   PMFS History: Patient Active Problem List   Diagnosis Date Noted   Nicotine dependence 10/28/2022   Bipolar 1 disorder (HCC) 05/07/2022   Chronic left shoulder pain 09/13/2021  History of Abnormal LFTs (liver function tests) 12/30/2019   Abnormal liver ultrasound 12/30/2019   Fatty liver 12/30/2019   Right flank pain 12/30/2019   Abdominal wall pain 12/30/2019   Gastroesophageal reflux disease without esophagitis 12/30/2019   Superior labrum anterior-to-posterior (SLAP) tear of left shoulder 10/01/2019   Carpal tunnel syndrome 09/08/2019   Bursitis of shoulder, right 08/31/2019   Incisional hernia, without obstruction or gangrene 05/20/2018   Opiate dependence (HCC) 08/02/2013   Past Medical History:  Diagnosis Date   ADHD (attention deficit hyperactivity disorder)    Anxiety    Arthritis    Cancer (HCC)    skin cancer   Chest pain 08/2018   GERD (gastroesophageal reflux disease)    no meds   History of COVID-19 04/05/2020    Migraine    migraines due to pinched nerve   Pinched nerve in neck    Scoliosis    Seizure (HCC)     Family History  Problem Relation Age of Onset   Hypertension Mother    Diabetes Mother    Kidney cancer Mother    Diabetes Maternal Aunt    Diabetes Maternal Grandmother    Cancer Maternal Grandmother        unknown type. doing chemo   Cancer Maternal Grandfather    Stomach cancer Neg Hx    Pancreatic cancer Neg Hx    Esophageal cancer Neg Hx    Liver disease Neg Hx    Colon cancer Neg Hx    Rectal cancer Neg Hx     Past Surgical History:  Procedure Laterality Date   BICEPT TENODESIS Left 06/21/2020   Procedure: BICEPS TENODESIS TENOLYSIS;  Surgeon: Tarry Kos, MD;  Location: [facility];  Service: Orthopedics;  Laterality: Left;   ESOPHAGUS SURGERY     as a baby   PILONIDAL CYST EXCISION N/A 01/04/2022   Procedure: TREPHINATION OF PILONIDAL CYST;  Surgeon: Fritzi Mandes, MD;  Location: [facility];  Service: General;  Laterality: N/A;  sacrum   SHOULDER ARTHROSCOPY WITH BICEPS TENDON REPAIR Left 03/01/2020   Procedure: LEFT SHOULDER ARTHROSCOPY WITH BICEPS TENODESIS;  Surgeon: Tarry Kos, MD;  Location:  [facility];  Service: Orthopedics;  Laterality: Left;   SHOULDER ARTHROSCOPY WITH SUBACROMIAL DECOMPRESSION Right 09/01/2019   Procedure: RIGHT SHOULDER ARTHROSCOPY WITH EXTENSIVE DEBRIDEMENT AND SUBACROMIAL DECOMPRESSION;  Surgeon: Tarry Kos, MD;  Location: [facility];  Service: Orthopedics;  Laterality: Right;   VENTRAL HERNIA REPAIR N/A 08/31/2018   Procedure: HERNIA REPAIR VENTRAL ADULT;  Surgeon: Henrene Dodge, MD;  Location: [facility];  Service: General;  Laterality: N/A;   Social History   Occupational History   Occupation: Holiday representative work  Tobacco Use   Smoking status: Every Day    Current packs/day: 0.75    Average packs/day: 0.8 packs/day for 15.0 years (11.3 ttl pk-yrs)    Types: Cigarettes     Passive exposure: Never   Smokeless tobacco: Former  Building services engineer status: Some Days  Substance and Sexual Activity   Alcohol use: Not Currently   Drug use: Not Currently   Sexual activity: Yes

## 2023-03-03 ENCOUNTER — Ambulatory Visit: Payer: MEDICAID | Admitting: Family Medicine

## 2023-04-12 ENCOUNTER — Encounter: Payer: Self-pay | Admitting: Family Medicine

## 2023-04-14 ENCOUNTER — Other Ambulatory Visit: Payer: Self-pay | Admitting: Family Medicine

## 2023-04-14 DIAGNOSIS — E3452 Partial androgen insensitivity syndrome: Secondary | ICD-10-CM

## 2023-05-01 ENCOUNTER — Encounter: Payer: Self-pay | Admitting: Family Medicine

## 2023-05-02 ENCOUNTER — Other Ambulatory Visit: Payer: Self-pay | Admitting: Family Medicine

## 2023-05-02 ENCOUNTER — Encounter: Payer: Self-pay | Admitting: Urology

## 2023-05-02 ENCOUNTER — Ambulatory Visit: Payer: MEDICAID | Admitting: Urology

## 2023-05-02 VITALS — BP 133/80 | HR 76 | Ht 64.0 in | Wt 171.0 lb

## 2023-05-02 DIAGNOSIS — M25569 Pain in unspecified knee: Secondary | ICD-10-CM

## 2023-05-02 DIAGNOSIS — N469 Male infertility, unspecified: Secondary | ICD-10-CM

## 2023-05-02 NOTE — Progress Notes (Signed)
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 He would like to proceed with a semen analysis at this time. Semen analysis ordered at [facility]. Will contact him with results.  Chief Complaint:  Chief Complaint  Patient presents with   Infertility    History of Present Illness:  Jimmy [name] is a 33 y.o. male who is seen in consultation from Hoy Register, MD for evaluation of possible male infertility.  History:  Duration: 6 weeks Prior pregnancies: Yes  - 1 child with prior partner; 1 miscarriage with prior partner; current partner has 2 children with different partner Previous treatments: No Evaluation/treatment of wife: No  Sexual history:  ED: No Lubricants: No Timing of intercourse: regular Frequency of intercourse: everyday Frequency of masturbation: rarely  Childhood and Development:  GU anomalies: No Undescended testicle or orchidopexy: No Herniorrhaphy: No Testicular torsion: No Testicular trauma: No Y-V plasty of bladder: No Onset of puberty: normal  Medical History:  Systemic illness: none Previous chemotherapy or radiation: No  Surgical History:  Orchiectomy: No Retroperitoneal surgery: No Pelvic injury: No Pelvic, inguinal, scrotal surgery: No Herniorrhaphy: No TURP: No  Infections:  Recent high fevers: No Mumps orchitis: No History of venereal disease: No Tuberculosis: No  Toxin exposures:  Chemicals (pesticides): No Drugs: ( chemotherapy, cimetidine, sulfasalazine, Nitrofurantoin ) No Illicit drugs: (alcohol, marijuana, steroids) Yes - prior history of opiod use - none x 7 months Thermal exposure: No Radiation exposure: No Smoking: Yes  - 1 ppd x 12 years  Family History:  Cystic fibrosis: No Androgen receptor deficiency: No Infertile first-degree relatives: No  Review of Systems:  Respiratory infections: No Anosmia:  No Galactorrhea: No Impaired visual fields: No    Past Medical History:  Past Medical History:  Diagnosis Date   ADHD (attention deficit hyperactivity disorder)    Anxiety    Arthritis    Cancer (HCC)    skin cancer   Chest pain 08/2018   GERD (gastroesophageal reflux disease)    no meds   History of COVID-19 04/05/2020   Migraine    migraines due to pinched nerve   Pinched nerve in neck    Scoliosis    Seizure [facility])     Past Surgical History:  Past Surgical History:  Procedure Laterality Date   BICEPT TENODESIS Left 06/21/2020   Procedure: BICEPS TENODESIS TENOLYSIS;  Surgeon: Tarry Kos, MD;  Location: [facility];  Service: Orthopedics;  Laterality: Left;   ESOPHAGUS SURGERY     as a baby   PILONIDAL CYST EXCISION N/A 01/04/2022   Procedure: TREPHINATION OF PILONIDAL CYST;  Surgeon: Fritzi Mandes, MD;  Location: [facility];  Service: General;  Laterality: N/A;  sacrum   SHOULDER ARTHROSCOPY WITH BICEPS TENDON REPAIR Left 03/01/2020   Procedure: LEFT SHOULDER ARTHROSCOPY WITH BICEPS TENODESIS;  Surgeon: Tarry Kos, MD;  Location: [facility];  Service: Orthopedics;  Laterality: Left;   SHOULDER ARTHROSCOPY WITH SUBACROMIAL DECOMPRESSION Right 09/01/2019   Procedure: RIGHT SHOULDER ARTHROSCOPY WITH EXTENSIVE DEBRIDEMENT AND SUBACROMIAL DECOMPRESSION;  Surgeon: Tarry Kos, MD;  Location: [facility];  Service: Orthopedics;  Laterality: Right;   VENTRAL HERNIA REPAIR N/A 08/31/2018   Procedure: HERNIA REPAIR VENTRAL ADULT;  Surgeon: Henrene Dodge, MD;  Location: [facility];  Service: General;  Laterality: N/A;    Allergies:  Allergies  Allergen Reactions   Cyclobenzaprine Hives and Nausea Only  Depakote [Divalproex Sodium] Other (See Comments)    "made him crazy"   Ritalin [Methylphenidate Hcl] Other (See Comments)    Increased hyper activeness    Fentanyl And Related Other (See Comments)    In recovery    Hydrocodone Other (See Comments)    In recovery   Morphine And Codeine Other (See Comments)    recovery   Oxycodone Other (See Comments)    In recovery   Naproxen Nausea Only    Acid reflux   Other Other (See Comments)    [facility]-pt states skin became reddened and he tasted metal (08-2018)   Tramadol Other (See Comments)    Acid reflux    Family History:  Family History  Problem Relation Age of Onset   Hypertension Mother    Diabetes Mother    Kidney cancer Mother    Diabetes Maternal Aunt    Diabetes Maternal Grandmother    Cancer Maternal Grandmother        unknown type. doing chemo   Cancer Maternal Grandfather    Stomach cancer Neg Hx    Pancreatic cancer Neg Hx    Esophageal cancer Neg Hx    Liver disease Neg Hx    Colon cancer Neg Hx    Rectal cancer Neg Hx     Social History:  Social History   Tobacco Use   Smoking status: Every Day    Current packs/day: 0.75    Average packs/day: 0.8 packs/day for 15.0 years (11.3 ttl pk-yrs)    Types: Cigarettes    Passive exposure: Never   Smokeless tobacco: Former  Building services engineer status: Some Days  Substance Use Topics   Alcohol use: Not Currently   Drug use: Not Currently    Review of symptoms:  Constitutional:  Negative for unexplained weight loss, night sweats, fever, chills ENT:  Negative for nose bleeds, sinus pain, painful swallowing CV:  Negative for chest pain, shortness of breath, exercise intolerance, palpitations, loss of consciousness Resp:  Negative for cough, wheezing, shortness of breath GI:  Negative for nausea, vomiting, diarrhea, bloody stools GU:  Positives noted in HPI; otherwise negative for gross hematuria, dysuria, urinary incontinence Neuro:  Negative for seizures, poor balance, limb weakness, slurred speech Psych:  Negative for lack of energy, depression, anxiety Endocrine:  Negative for polydipsia, polyuria, symptoms of hypoglycemia (dizziness, hunger, sweating) Hematologic:   Negative for anemia, purpura, petechia, prolonged or excessive bleeding, use of anticoagulants  Allergic:  Negative for difficulty breathing or choking as a result of exposure to anything; no shellfish allergy; no allergic response (rash/itch) to materials, foods  Physical exam: BP 133/80   Pulse 76   Ht 5\' 4"  (1.626 m)   Wt 171 lb (77.6 kg)   BMI 29.35 kg/m  GENERAL APPEARANCE:  Well appearing, well developed, well nourished, NAD HEENT:  Atraumatic, normocephalic, oropharynx clear NECK:  Supple without lymphadenopathy or thyromegaly ABDOMEN:  Soft, non-tender, no masses EXTREMITIES:  Moves all extremities well, without clubbing, cyanosis, or edema NEUROLOGIC:  Alert and oriented x 3, normal gait, CN II-XII grossly intact MENTAL STATUS:  appropriate BACK:  Non-tender to palpation, No CVAT SKIN:  Warm, dry, and intact GU: Penis:  circumcised Meatus: Normal Scrotum: vas palpated bilaterally, no varicocele Testis: normal without masses bilateral Epididymis: normal  Results: None

## 2023-05-13 ENCOUNTER — Encounter: Payer: Self-pay | Admitting: Urology

## 2023-05-23 ENCOUNTER — Ambulatory Visit: Payer: MEDICAID | Admitting: Sports Medicine

## 2023-05-26 ENCOUNTER — Ambulatory Visit
Admission: EM | Admit: 2023-05-26 | Discharge: 2023-05-26 | Disposition: A | Discharge status: Home / Self Care | Payer: MEDICAID | Attending: Nurse Practitioner | Admitting: Nurse Practitioner

## 2023-05-26 ENCOUNTER — Ambulatory Visit (HOSPITAL_COMMUNITY)
Admission: RE | Admit: 2023-05-26 | Discharge: 2023-05-26 | Disposition: A | Discharge status: Home / Self Care | Payer: MEDICAID | Source: Ambulatory Visit | Attending: Nurse Practitioner | Admitting: Nurse Practitioner

## 2023-05-26 DIAGNOSIS — M25532 Pain in left wrist: Secondary | ICD-10-CM | POA: Diagnosis not present

## 2023-05-26 NOTE — ED Provider Notes (Signed)
 [facility]    CSN: [id number] Arrival date & time: 05/26/23  1146      History   Chief Complaint Chief Complaint  Patient presents with   Wrist Pain    HPI Jimmy [name] is a 33 y.o. male.   The history is provided by the patient.   Patient presents for complaints of left wrist pain that is been present for the past 3 weeks.  Patient denies any obvious known injury or trauma.  Describes his pain as "throbbing", also states that the pain radiates into his forearm.  Patient states the pain is located on the outside of his left wrist extending to the medial aspect of the wrist and radiates into his fourth and fifth fingers and into the forearm.  He has pain with repetitive motion and heavy lifting.  Past Medical History:  Diagnosis Date   ADHD (attention deficit hyperactivity disorder)    Anxiety    Arthritis    Cancer (HCC)    skin cancer   Chest pain 08/2018   GERD (gastroesophageal reflux disease)    no meds   History of COVID-19 04/05/2020   Migraine    migraines due to pinched nerve   Pinched nerve in neck    Scoliosis    Seizure [facility])     Patient Active Problem List   Diagnosis Date Noted   Nicotine dependence 10/28/2022   Bipolar 1 disorder (HCC) 05/07/2022   Chronic left shoulder pain 09/13/2021   History of Abnormal LFTs (liver function tests) 12/30/2019   Abnormal liver ultrasound 12/30/2019   Fatty liver 12/30/2019   Right flank pain 12/30/2019   Abdominal wall pain 12/30/2019   Gastroesophageal reflux disease without esophagitis 12/30/2019   Superior labrum anterior-to-posterior (SLAP) tear of left shoulder 10/01/2019   Carpal tunnel syndrome 09/08/2019   Bursitis of shoulder, right 08/31/2019   Incisional hernia, without obstruction or gangrene 05/20/2018   Opiate dependence (HCC) 08/02/2013    Past Surgical History:  Procedure Laterality Date   BICEPT TENODESIS Left 06/21/2020   Procedure: BICEPS TENODESIS TENOLYSIS;  Surgeon: Tarry Kos, MD;  Location: [facility];  Service: Orthopedics;  Laterality: Left;   ESOPHAGUS SURGERY     as a baby   PILONIDAL CYST EXCISION N/A 01/04/2022   Procedure: TREPHINATION OF PILONIDAL CYST;  Surgeon: Fritzi Mandes, MD;  Location: [facility];  Service: General;  Laterality: N/A;  sacrum   SHOULDER ARTHROSCOPY WITH BICEPS TENDON REPAIR Left 03/01/2020   Procedure: LEFT SHOULDER ARTHROSCOPY WITH BICEPS TENODESIS;  Surgeon: Tarry Kos, MD;  Location: [facility];  Service: Orthopedics;  Laterality: Left;   SHOULDER ARTHROSCOPY WITH SUBACROMIAL DECOMPRESSION Right 09/01/2019   Procedure: RIGHT SHOULDER ARTHROSCOPY WITH EXTENSIVE DEBRIDEMENT AND SUBACROMIAL DECOMPRESSION;  Surgeon: Tarry Kos, MD;  Location: [facility];  Service: Orthopedics;  Laterality: Right;   VENTRAL HERNIA REPAIR N/A 08/31/2018   Procedure: HERNIA REPAIR VENTRAL ADULT;  Surgeon: Henrene Dodge, MD;  Location: [facility];  Service: General;  Laterality: N/A;       Home Medications    Prior to Admission medications   Medication Sig Start Date End Date Taking? Authorizing Provider  AIMOVIG 140 MG/ML SOAJ Inject into the skin every 30 (thirty) days. Patient not taking: Reported on 11/10/2022 08/09/21   [provider]  ciprofloxacin-dexamethasone (CIPRODEX) OTIC suspension Place 4 drops into the left ear 2 (two) times daily. 10/28/22   Hoy Register, MD  doxycycline (VIBRAMYCIN) 100 MG capsule Take 1 capsule (100 mg total) by mouth 2 (two) times daily. 01/23/23   Mardella Layman, MD  DULoxetine (CYMBALTA) 60 MG capsule Take 1 capsule (60 mg total) by mouth daily. 10/28/22   Hoy Register, MD  gabapentin (NEURONTIN) 400 MG capsule Take 1 capsule (400 mg total) by mouth 2 (two) times daily. 10/28/22   Hoy Register, MD  Gauze Pads & Dressings (GAUZE DRESSING) 4"X4" PADS 4 each by Does not apply route daily at 6 (six) AM. 01/04/22   Fritzi Mandes, MD   ibuprofen (ADVIL) 800 MG tablet Take 1 tablet (800 mg total) by mouth 3 (three) times daily with meals. 01/23/23   Mardella Layman, MD  methocarbamol (ROBAXIN) 500 MG tablet Take 2 tablets (1,000 mg total) by mouth 2 (two) times daily. 03/26/22   Hoy Register, MD  nicotine (NICODERM CQ) 21 mg/24hr patch Place 1 patch (21 mg total) onto the skin daily. 01/15/23   Hoy Register, MD  QUEtiapine (SEROQUEL) 100 MG tablet Take 1 tablet (100 mg total) by mouth at bedtime. 10/28/22   Hoy Register, MD  rizatriptan (MAXALT) 10 MG tablet Take 1 tablet (10 mg total) by mouth as needed for migraine. May repeat in 2 hours if needed 10/28/22   Hoy Register, MD  famotidine (PEPCID) 40 MG tablet Take 1 tablet (40 mg total) by mouth daily. 08/26/18 12/28/19  Elvina Sidle, MD    Family History Family History  Problem Relation Age of Onset   Hypertension Mother    Diabetes Mother    Kidney cancer Mother    Diabetes Maternal Aunt    Diabetes Maternal Grandmother    Cancer Maternal Grandmother        unknown type. doing chemo   Cancer Maternal Grandfather    Stomach cancer Neg Hx    Pancreatic cancer Neg Hx    Esophageal cancer Neg Hx    Liver disease Neg Hx    Colon cancer Neg Hx    Rectal cancer Neg Hx     Social History Social History   Tobacco Use   Smoking status: Every Day    Current packs/day: 0.75    Average packs/day: 0.8 packs/day for 15.0 years (11.3 ttl pk-yrs)    Types: Cigarettes    Passive exposure: Never   Smokeless tobacco: Former  Building services engineer status: Some Days  Substance Use Topics   Alcohol use: Not Currently   Drug use: Not Currently     Allergies   Cyclobenzaprine, Depakote [divalproex sodium], Ritalin [methylphenidate hcl], Fentanyl and related, Hydrocodone, Morphine and codeine, Oxycodone, Naproxen, Other, and Tramadol   Review of Systems Review of Systems Per HPI  Physical Exam Triage Vital Signs ED Triage Vitals  Encounter Vitals Group      BP 05/26/23 1158 134/79     Systolic BP Percentile --      Diastolic BP Percentile --      Pulse Rate 05/26/23 1158 77     Resp 05/26/23 1158 16     Temp 05/26/23 1158 98.1 F (36.7 C)     Temp Source 05/26/23 1158 Oral     SpO2 05/26/23 1158 96 %     Weight --      Height --      Head Circumference --      Peak Flow --      Pain Score 05/26/23 1200 8     Pain Loc --      Pain  Education --      Exclude from [employer] Chart --    No data found.  Updated Vital Signs BP 134/79 (BP Location: Right Arm)   Pulse 77   Temp 98.1 F (36.7 C) (Oral)   Resp 16   SpO2 96%   Visual Acuity Right Eye Distance:   Left Eye Distance:   Bilateral Distance:    Right Eye Near:   Left Eye Near:    Bilateral Near:     Physical Exam Vitals and nursing note reviewed.  Constitutional:      General: He is not in acute distress.    Appearance: Normal appearance.  HENT:     Head: Normocephalic.  Eyes:     Extraocular Movements: Extraocular movements intact.     Pupils: Pupils are equal, round, and reactive to light.  Pulmonary:     Effort: Pulmonary effort is normal.  Musculoskeletal:     Left wrist: Tenderness present. No swelling, deformity or crepitus. Decreased range of motion (increased pain with ulnar and radial deviation). Normal pulse.     Cervical back: Normal range of motion.  Skin:    General: Skin is warm and dry.  Neurological:     General: No focal deficit present.     Mental Status: He is alert and oriented to person, place, and time.  Psychiatric:        Mood and Affect: Mood normal.        Behavior: Behavior normal.      UC Treatments / Results  Labs (all labs ordered are listed, but only abnormal results are displayed) Labs Reviewed - No data to display  EKG   Radiology No results found.  Procedures Procedures (including critical care time)  Medications Ordered in UC Medications - No data to display  Initial Impression / Assessment and Plan / UC  Course  I have reviewed the triage vital signs and the nursing notes.  Pertinent labs & imaging results that were available during my care of the patient were reviewed by me and considered in my medical decision making (see chart for details).  X-ray of the left wrist is pending. Differential diagnosis include left wrist sprain, carpal tunnel  *** Final Clinical Impressions(s) / UC Diagnoses   Final diagnoses:  None   Discharge Instructions   None    ED Prescriptions   None    PDMP not reviewed this encounter.

## 2023-05-26 NOTE — Discharge Instructions (Addendum)
 Go to [facility] for an x-ray of your wrist.  Go to the main entrance to get to the radiology department.  When the results of the x-ray are received, we will be contacted.  You also have access to the results via MyChart. You may take over-the-counter Tylenol or ibuprofen as needed for pain or discomfort. Wear the brace that was previously provided to you.  Recommend wearing the brace when you are engaged in prolonged or strenuous activity to provide additional compression and support. Gentle range of motion exercises to help improve your joint mobility.  I provided exercises for you to perform, recommend performing them at least 2-3 times daily while symptoms persist. RICE therapy, rest, ice, compression, and elevation.  Apply ice for 20 minutes, remove for 1 hour, repeat as needed. As discussed, if you are not experiencing any relief of your symptoms, recommend following up with orthopedics or with your PCP for further evaluation. Follow-up as needed.

## 2023-05-26 NOTE — ED Triage Notes (Signed)
 Pt states he is having left wrist pain x 3 weeks. Pt states he does not remember any injury or fall.

## 2023-06-05 ENCOUNTER — Ambulatory Visit: Payer: MEDICAID | Admitting: Sports Medicine

## 2023-06-18 ENCOUNTER — Encounter: Payer: Self-pay | Admitting: Family Medicine

## 2023-06-18 ENCOUNTER — Ambulatory Visit: Payer: MEDICAID | Attending: Family Medicine | Admitting: Family Medicine

## 2023-06-18 VITALS — BP 124/76 | HR 72 | Ht 64.0 in | Wt 171.0 lb

## 2023-06-18 DIAGNOSIS — F1129 Opioid dependence with unspecified opioid-induced disorder: Secondary | ICD-10-CM

## 2023-06-18 DIAGNOSIS — F17210 Nicotine dependence, cigarettes, uncomplicated: Secondary | ICD-10-CM | POA: Diagnosis not present

## 2023-06-18 DIAGNOSIS — Z13228 Encounter for screening for other metabolic disorders: Secondary | ICD-10-CM

## 2023-06-18 DIAGNOSIS — G4709 Other insomnia: Secondary | ICD-10-CM

## 2023-06-18 DIAGNOSIS — G43809 Other migraine, not intractable, without status migrainosus: Secondary | ICD-10-CM | POA: Diagnosis not present

## 2023-06-18 DIAGNOSIS — F3161 Bipolar disorder, current episode mixed, mild: Secondary | ICD-10-CM

## 2023-06-18 DIAGNOSIS — Z131 Encounter for screening for diabetes mellitus: Secondary | ICD-10-CM

## 2023-06-18 DIAGNOSIS — Z23 Encounter for immunization: Secondary | ICD-10-CM

## 2023-06-18 DIAGNOSIS — Z13220 Encounter for screening for lipoid disorders: Secondary | ICD-10-CM

## 2023-06-18 MED ORDER — NICOTINE POLACRILEX 2 MG MT GUM: 2.0000 mg | CHEWING_GUM | OROMUCOSAL | 1 refills | Status: DC

## 2023-06-18 MED ORDER — QUETIAPINE FUMARATE 100 MG PO TABS: 100.0000 mg | ORAL_TABLET | Freq: Every day | ORAL | 1 refills | Status: DC

## 2023-06-18 NOTE — Progress Notes (Signed)
 Subjective:  Patient ID: Jimmy [name], male    DOB: 01-23-1991  Age: 33 y.o. MRN: [id number]  CC: Medical Management of Chronic Issues (Nicoderm patches not working)     Discussed the use of AI scribe software for clinical note transcription with the patient, who gave verbal consent to proceed.  History of Present Illness The patient, with a history of migraines, bipolar disorder, insomnia, chronic left shoulder pain (status post left shoulder arthroscopy, biceps tenodesis tenolysis in 2021 and 2022), Opioid dependence, teen dependence presents for smoking cessation and migraine management. He reports that nicotine patches are not effective due to his active, outdoor lifestyle causing the patches to not adhere to his skin. He expresses interest in trying nicotine gum for smoking cessation.  His migraines occur approximately once a week and have not been responsive to Maxalt or Ubrelvy. He has tried multiple migraine medications without relief. He was previously referred to a neurologist, but the referral was not completed due to location and insurance issues.  The patient is not currently taking Cymbalta for depression and pain, gabapentin, or any pain medications. He expresses a strong aversion to taking pain medications, including over-the-counter options like ibuprofen or Tylenol, due to a history of addiction to pain pills.  He was previously in rehab in [place] and then was moved to the [facility] but he now lives alone.  He is proud to report being nine months clean and regularly attends meetings for support, though he does not have a sponsor.  The only medication he is currently taking is Seroquel for sleep, which is effective. He has not had any recent blood work done.    Past Medical History:  Diagnosis Date   ADHD (attention deficit hyperactivity disorder)    Anxiety    Arthritis    Cancer (HCC)    skin cancer   Chest pain 08/2018   GERD (gastroesophageal reflux disease)     no meds   History of COVID-19 04/05/2020   Migraine    migraines due to pinched nerve   Pinched nerve in neck    Scoliosis    Seizure [facility])     Past Surgical History:  Procedure Laterality Date   BICEPT TENODESIS Left 06/21/2020   Procedure: BICEPS TENODESIS TENOLYSIS;  Surgeon: Tarry Kos, MD;  Location: [facility];  Service: Orthopedics;  Laterality: Left;   ESOPHAGUS SURGERY     as a baby   PILONIDAL CYST EXCISION N/A 01/04/2022   Procedure: TREPHINATION OF PILONIDAL CYST;  Surgeon: Fritzi Mandes, MD;  Location: [facility];  Service: General;  Laterality: N/A;  sacrum   SHOULDER ARTHROSCOPY WITH BICEPS TENDON REPAIR Left 03/01/2020   Procedure: LEFT SHOULDER ARTHROSCOPY WITH BICEPS TENODESIS;  Surgeon: Tarry Kos, MD;  Location: [facility];  Service: Orthopedics;  Laterality: Left;   SHOULDER ARTHROSCOPY WITH SUBACROMIAL DECOMPRESSION Right 09/01/2019   Procedure: RIGHT SHOULDER ARTHROSCOPY WITH EXTENSIVE DEBRIDEMENT AND SUBACROMIAL DECOMPRESSION;  Surgeon: Tarry Kos, MD;  Location: [facility];  Service: Orthopedics;  Laterality: Right;   VENTRAL HERNIA REPAIR N/A 08/31/2018   Procedure: HERNIA REPAIR VENTRAL ADULT;  Surgeon: Henrene Dodge, MD;  Location: [facility];  Service: General;  Laterality: N/A;    Family History  Problem Relation Age of Onset   Hypertension Mother    Diabetes Mother    Kidney cancer Mother    Diabetes Maternal Aunt    Diabetes Maternal Grandmother    Cancer  Maternal Grandmother        unknown type. doing chemo   Cancer Maternal Grandfather    Stomach cancer Neg Hx    Pancreatic cancer Neg Hx    Esophageal cancer Neg Hx    Liver disease Neg Hx    Colon cancer Neg Hx    Rectal cancer Neg Hx     Social History   Socioeconomic History   Marital status: Married    Spouse name: Not on file   Number of children: 3   Years of education: Not on file   Highest education level:  11th grade  Occupational History   Occupation: Holiday representative work  Tobacco Use   Smoking status: Every Day    Current packs/day: 0.75    Average packs/day: 0.8 packs/day for 15.0 years (11.3 ttl pk-yrs)    Types: Cigarettes    Passive exposure: Never   Smokeless tobacco: Former  Building services engineer status: Some Days  Substance and Sexual Activity   Alcohol use: Not Currently   Drug use: Not Currently   Sexual activity: Yes  Other Topics Concern   Not on file  Social History Narrative   Right handed   One story home    Drinks caffeine    Social Drivers of Health   Financial Resource Strain: Low Risk  (06/18/2023)   Overall Financial Resource Strain (CARDIA)    Difficulty of Paying Living Expenses: Not hard at all  Food Insecurity: No Food Insecurity (06/18/2023)   Hunger Vital Sign    Worried About Running Out of Food in the Last Year: Never true    Ran Out of Food in the Last Year: Never true  Transportation Needs: No Transportation Needs (06/18/2023)   PRAPARE - Administrator, Civil Service (Medical): No    Lack of Transportation (Non-Medical): No  Physical Activity: Unknown (06/18/2023)   Exercise Vital Sign    Days of Exercise per Week: 0 days    Minutes of Exercise per Session: Not on file  Stress: No Stress Concern Present (06/18/2023)   [employer] of Occupational Health - Occupational Stress Questionnaire    Feeling of Stress : Only a little  Social Connections: Socially Isolated (06/18/2023)   Social Connection and Isolation Panel [NHANES]    Frequency of Communication with Friends and Family: More than three times a week    Frequency of Social Gatherings with Friends and Family: More than three times a week    Attends Religious Services: Never    Database administrator or Organizations: No    Attends Engineer, structural: Not on file    Marital Status: Separated    Allergies  Allergen Reactions   Cyclobenzaprine Hives and Nausea Only    Depakote [Divalproex Sodium] Other (See Comments)    "made him crazy"   Ritalin [Methylphenidate Hcl] Other (See Comments)    Increased hyper activeness    Fentanyl And Related Other (See Comments)    In recovery   Hydrocodone Other (See Comments)    In recovery   Morphine And Codeine Other (See Comments)    recovery   Oxycodone Other (See Comments)    In recovery   Naproxen Nausea Only    Acid reflux   Other Other (See Comments)    [facility]-pt states skin became reddened and he tasted metal (08-2018)   Tramadol Other (See Comments)    Acid reflux    Outpatient Medications Prior to Visit  Medication Sig  Dispense Refill   Gauze Pads & Dressings (GAUZE DRESSING) 4"X4" PADS 4 each by Does not apply route daily at 6 (six) AM. 24 each 1   doxycycline (VIBRAMYCIN) 100 MG capsule Take 1 capsule (100 mg total) by mouth 2 (two) times daily. 14 capsule 0   gabapentin (NEURONTIN) 400 MG capsule Take 1 capsule (400 mg total) by mouth 2 (two) times daily. 180 capsule 1   methocarbamol (ROBAXIN) 500 MG tablet Take 2 tablets (1,000 mg total) by mouth 2 (two) times daily. 360 tablet 1   QUEtiapine (SEROQUEL) 100 MG tablet Take 1 tablet (100 mg total) by mouth at bedtime. 90 tablet 1   rizatriptan (MAXALT) 10 MG tablet Take 1 tablet (10 mg total) by mouth as needed for migraine. May repeat in 2 hours if needed 10 tablet 1   AIMOVIG 140 MG/ML SOAJ Inject into the skin every 30 (thirty) days. (Patient not taking: Reported on 11/10/2022)     ciprofloxacin-dexamethasone (CIPRODEX) OTIC suspension Place 4 drops into the left ear 2 (two) times daily. (Patient not taking: Reported on 06/18/2023) 7.5 mL 0   DULoxetine (CYMBALTA) 60 MG capsule Take 1 capsule (60 mg total) by mouth daily. (Patient not taking: Reported on 06/18/2023) 90 capsule 1   famotidine (PEPCID) 40 MG tablet Take 1 tablet (40 mg total) by mouth daily. 90 tablet 1   ibuprofen (ADVIL) 800 MG tablet Take 1 tablet (800 mg total) by mouth 3 (three)  times daily with meals. (Patient not taking: Reported on 06/18/2023) 21 tablet 0   nicotine (NICODERM CQ) 21 mg/24hr patch Place 1 patch (21 mg total) onto the skin daily. (Patient not taking: Reported on 06/18/2023) 28 patch 2   Facility-Administered Medications Prior to Visit  Medication Dose Route Frequency Provider Last Rate Last Admin   triamcinolone acetonide (KENALOG) 10 MG/ML injection 20 mg  20 mg Other Once Sheffield, Kelli R, PA-C         ROS Review of Systems  Constitutional:  Negative for activity change and appetite change.  HENT:  Negative for sinus pressure and sore throat.   Respiratory:  Negative for chest tightness, shortness of breath and wheezing.   Cardiovascular:  Negative for chest pain and palpitations.  Gastrointestinal:  Negative for abdominal distention, abdominal pain and constipation.  Genitourinary: Negative.   Musculoskeletal: Negative.   Psychiatric/Behavioral:  Negative for behavioral problems and dysphoric mood.     Objective:  BP 124/76   Pulse 72   Ht 5\' 4"  (1.626 m)   Wt 171 lb (77.6 kg)   SpO2 98%   BMI 29.35 kg/m      06/18/2023    9:50 AM 05/26/2023   11:58 AM 05/02/2023    3:03 PM  BP/Weight  Systolic BP 124 134 133  Diastolic BP 76 79 80  Wt. (Lbs) 171  171  BMI 29.35 kg/m2  29.35 kg/m2      Physical Exam Constitutional:      Appearance: He is well-developed.  Cardiovascular:     Rate and Rhythm: Normal rate.     Heart sounds: Normal heart sounds. No murmur heard. Pulmonary:     Effort: Pulmonary effort is normal.     Breath sounds: Normal breath sounds. No wheezing or rales.  Chest:     Chest wall: No tenderness.  Abdominal:     General: Bowel sounds are normal. There is no distension.     Palpations: Abdomen is soft. There is no mass.  Tenderness: There is no abdominal tenderness.  Musculoskeletal:        General: Normal range of motion.     Right lower leg: No edema.     Left lower leg: No edema.  Neurological:      Mental Status: He is alert and oriented to person, place, and time.  Psychiatric:        Mood and Affect: Mood normal.        Latest Ref Rng & Units 03/07/2022    9:10 AM 11/15/2021    9:54 AM 08/28/2021   11:46 AM  CMP  Glucose 70 - 99 mg/dL 90  932  355   BUN 6 - 20 mg/dL 16  13  5    Creatinine 0.76 - 1.27 mg/dL 7.32  2.02  5.42   Sodium 134 - 144 mmol/L 137  139  136   Potassium 3.5 - 5.2 mmol/L 4.4  4.7  3.6   Chloride 96 - 106 mmol/L 101  110  106   CO2 20 - 29 mmol/L 21  25  26    Calcium 8.7 - 10.2 mg/dL 9.6  9.2  8.8   Total Protein 6.0 - 8.5 g/dL 7.0   6.9   Total Bilirubin 0.0 - 1.2 mg/dL <7.0   0.4   Alkaline Phos 44 - 121 IU/L 97   73   AST 0 - 40 IU/L 14   18   ALT 0 - 44 IU/L 17   19     Lipid Panel     Component Value Date/Time   CHOL 181 03/07/2022 0910   TRIG 158 (H) 03/07/2022 0910   HDL 45 03/07/2022 0910   CHOLHDL 4.7 04/21/2020 1051   LDLCALC 108 (H) 03/07/2022 0910   LDLCALC 137 (H) 04/21/2020 1051    CBC    Component Value Date/Time   WBC 14.9 (H) 11/15/2021 0954   RBC 4.64 11/15/2021 0954   HGB 14.2 11/15/2021 0954   HCT 42.3 11/15/2021 0954   PLT 268 11/15/2021 0954   MCV 91.2 11/15/2021 0954   MCH 30.6 11/15/2021 0954   MCHC 33.6 11/15/2021 0954   RDW 13.0 11/15/2021 0954   LYMPHSABS 2.1 11/15/2021 0954   MONOABS 0.8 11/15/2021 0954   EOSABS 0.1 11/15/2021 0954   BASOSABS 0.1 11/15/2021 0954    Lab Results  Component Value Date   HGBA1C 5.4 08/30/2021       Assessment & Plan Nicotine dependence Nicotine patches ineffective due to adherence issues. Interested in nicotine gum. - Prescribe nicotine gum and send prescription to pharmacy. - Consider prescription for pills if gum is ineffective.  Migraine Experiences weekly migraines. Previous treatments ineffective. Referral to neurologist needed. - Refer to a neurologist for further evaluation and management.  Opioid dependence Nine months clean from pain pills. Attends  support meetings. Managing well without a sponsor.  Insomnia Seroquel effective for sleep. - Continue Seroquel for sleep.  Bipolar disorder -Symptoms are controlled on Seroquel  Preventive care Smoker due for pneumonia vaccine. . - Administer pneumonia vaccine.  Laboratory evaluation No recent blood work. Comprehensive evaluation planned. - Order comprehensive blood work including kidney function, liver function, cholesterol, and diabetes screening.  Follow-up Lab results to be communicated via MyChart. Neurologist's office will contact for appointment. - Review lab results and communicate findings through MyChart. - Ensure neurologist's office contacts him for scheduling.      Meds ordered this encounter  Medications   nicotine polacrilex (NICORETTE) 2 MG gum  Sig: Take 1 each (2 mg total) by mouth every 4 (four) hours while awake.    Dispense:  100 tablet    Refill:  1   QUEtiapine (SEROQUEL) 100 MG tablet    Sig: Take 1 tablet (100 mg total) by mouth at bedtime.    Dispense:  90 tablet    Refill:  1    Follow-up: Return in about 6 months (around 12/18/2023) for Chronic medical conditions.       Hoy Register, MD, FAAFP. [facility] [place], [place] [phone]   06/18/2023, 12:38 PM

## 2023-06-18 NOTE — Patient Instructions (Signed)
 VISIT SUMMARY:  During today's visit, we discussed your ongoing efforts to quit smoking, manage your migraines, and maintain your overall health. We also reviewed your current medications and preventive care needs.  YOUR PLAN:  -SMOKING CESSATION: You have found nicotine patches ineffective due to your active lifestyle. We will try nicotine gum as an alternative. If the gum does not work, we may consider prescribing pills.  -MIGRAINE: You experience weekly migraines that have not responded to previous treatments. We will refer you to a neurologist for further evaluation and management.  -OPIOID DEPENDENCE: You have been clean from pain pills for nine months and are managing well with support meetings. No changes to your current management are needed.  -INSOMNIA: Your current medication, Seroquel, is effective for sleep. Continue taking Seroquel as prescribed.  -PREVENTIVE CARE: As a smoker, you are due for a pneumonia vaccine. This vaccine helps prevent pneumonia and does not cause illness like the flu vaccine might. We will administer the pneumonia vaccine today.  -LABORATORY EVALUATION: You have not had recent blood work. We will order comprehensive blood work to check your kidney function, liver function, cholesterol, and screen for diabetes.  INSTRUCTIONS:  We will review your lab results and communicate the findings through MyChart. Additionally, the neurologist's office will contact you to schedule an appointment for further evaluation of your migraines.

## 2023-06-19 ENCOUNTER — Ambulatory Visit: Payer: MEDICAID | Admitting: Family Medicine

## 2023-06-19 ENCOUNTER — Encounter: Payer: Self-pay | Admitting: Family Medicine

## 2023-06-19 LAB — CBC WITH DIFFERENTIAL/PLATELET
Basophils Absolute: 0.1 10*3/uL (ref 0.0–0.2)
Basos: 1 %
EOS (ABSOLUTE): 0.1 10*3/uL (ref 0.0–0.4)
Eos: 1 %
Hematocrit: 46.2 % (ref 37.5–51.0)
Hemoglobin: 15.6 g/dL (ref 13.0–17.7)
Immature Grans (Abs): 0 10*3/uL (ref 0.0–0.1)
Immature Granulocytes: 0 %
Lymphocytes Absolute: 2.7 10*3/uL (ref 0.7–3.1)
Lymphs: 33 %
MCH: 30.2 pg (ref 26.6–33.0)
MCHC: 33.8 g/dL (ref 31.5–35.7)
MCV: 90 fL (ref 79–97)
Monocytes Absolute: 0.6 10*3/uL (ref 0.1–0.9)
Monocytes: 7 %
Neutrophils Absolute: 4.7 10*3/uL (ref 1.4–7.0)
Neutrophils: 58 %
Platelets: 292 10*3/uL (ref 150–450)
RBC: 5.16 x10E6/uL (ref 4.14–5.80)
RDW: 12.3 % (ref 11.6–15.4)
WBC: 8.1 10*3/uL (ref 3.4–10.8)

## 2023-06-19 LAB — LP+NON-HDL CHOLESTEROL
Cholesterol, Total: 193 mg/dL (ref 100–199)
HDL: 49 mg/dL (ref >39)
LDL Chol Calc (NIH): 131 mg/dL — ABNORMAL HIGH (ref 0–99)
Total Non-HDL-Chol (LDL+VLDL): 144 mg/dL — ABNORMAL HIGH (ref 0–129)
Triglycerides: 70 mg/dL (ref 0–149)
VLDL Cholesterol Cal: 13 mg/dL (ref 5–40)

## 2023-06-19 LAB — HEMOGLOBIN A1C
Est. average glucose Bld gHb Est-mCnc: 114 mg/dL
Hgb A1c MFr Bld: 5.6 % (ref 4.8–5.6)

## 2023-06-19 LAB — CMP14+EGFR
ALT: 24 IU/L (ref 0–44)
AST: 16 IU/L (ref 0–40)
Albumin: 4.7 g/dL (ref 4.1–5.1)
Alkaline Phosphatase: 86 IU/L (ref 44–121)
BUN/Creatinine Ratio: 16 (ref 9–20)
BUN: 13 mg/dL (ref 6–20)
Bilirubin Total: 0.3 mg/dL (ref 0.0–1.2)
CO2: 22 mmol/L (ref 20–29)
Calcium: 9.6 mg/dL (ref 8.7–10.2)
Chloride: 105 mmol/L (ref 96–106)
Creatinine, Ser: 0.79 mg/dL (ref 0.76–1.27)
Globulin, Total: 1.9 g/dL (ref 1.5–4.5)
Glucose: 89 mg/dL (ref 70–99)
Potassium: 4.7 mmol/L (ref 3.5–5.2)
Sodium: 140 mmol/L (ref 134–144)
Total Protein: 6.6 g/dL (ref 6.0–8.5)
eGFR: 120 mL/min/{1.73_m2} (ref >59)

## 2023-07-03 NOTE — Telephone Encounter (Signed)
 Unable to reach patient by phone. Voicemail left to return call to schedule appointment.

## 2023-07-04 ENCOUNTER — Telehealth: Payer: MEDICAID | Admitting: Nurse Practitioner

## 2023-07-04 ENCOUNTER — Encounter: Payer: Self-pay | Admitting: Family Medicine

## 2023-07-04 ENCOUNTER — Ambulatory Visit: Payer: Self-pay

## 2023-07-04 DIAGNOSIS — Z202 Contact with and (suspected) exposure to infections with a predominantly sexual mode of transmission: Secondary | ICD-10-CM

## 2023-07-04 NOTE — Telephone Encounter (Signed)
 This encounter was created in error - please disregard.

## 2023-07-04 NOTE — Telephone Encounter (Signed)
 FYI  I have sent pt MyChart message to provider  More clinical information in triage note

## 2023-07-04 NOTE — Telephone Encounter (Signed)
 Patient returned call to request if PCP could order medication for possible STI. Patient's girlfriend recently tested positive for trichomoniasis and was told by her provider to have patient contact his PCP for antibiotics. Patient reports he was told he would get a call back by 3 pm. Reviewed with patient office closed today for holiday and PCP not available . Patient reports he will be checking My Chart messages regarding this issue.  In review , NT called patient back and recommended UC VV / UC due to sx. Patient verbalized understanding .

## 2023-07-04 NOTE — Telephone Encounter (Signed)
 Will forward to provider for recommendations

## 2023-07-04 NOTE — Telephone Encounter (Signed)
 Copied from CRM [id number]. Topic: Clinical - Medical Advice >> Jul 04, 2023 10:04 AM Rosaria BRAVO wrote: Reason for CRM: My chart message from patient: My pregnant partner just tested positive for trichomoniasis wht do I need to do.  Chief Complaint: STI exposure Symptoms: burning with urination Frequency: unknown Pertinent Negatives: Patient denies fever, discharge Disposition: [] ED /[] Urgent Care (no appt availability in office) / [] Appointment(In office/virtual)/ []  [facility]/ [] Home Care/ [x] Refused Recommended Disposition /[] [facility]/ []  Follow-up with PCP Additional Notes: burnging with urination ongoing for a while - pt stated he thought it was from holding urine for long periods of time. This Tuesday his pregnant partner tested positive for trichomoniasis & informed him her provider stated he needed prescription of ABX. Appt offered: none available with PCP: attempted to look for appt with another provider. Pt states he does not want an appt because he can't get off work & he was just seen in office:  wanted to she if could get a Rx.  Please call    Reason for Disposition  Sex partner of someone who was diagnosed with an STI  (Exception: Male exposed to bacterial vaginosis or vaginal yeast infection.)  Answer Assessment - Initial Assessment Questions 1. MAIN CONCERN: What were you exposed to?  What sexually transmitted infection (STI) does your sex partner have? (e.g., gonorrhea, herpes, HIV, pubic lice)      trichomoniasis  2. ROUTE of EXPOSURE: How were you exposed to the STI? (e.g., oral, vaginal, or rectal intercourse)     vaginal 3. DATE of EXPOSURE: When did the exposure occur? (e.g., days)     Unknown but partner found out on Tuesday 4. SYMPTOMS: Do you have any symptoms? (e.g., pain with urination, rash, sores)     Burning with urination 5. PREGNANCY: Is there any chance you are pregnant? When was your last menstrual period?      no  Protocols used: STI Exposure-A-AH

## 2023-07-04 NOTE — Progress Notes (Signed)
 Patient disconnected before visit was established unable to verify or complete visit - advise d via Mychart to visit UC for care over the weekend

## 2023-07-05 ENCOUNTER — Encounter: Payer: Self-pay | Admitting: Family Medicine

## 2023-07-07 ENCOUNTER — Other Ambulatory Visit: Payer: Self-pay | Admitting: Family Medicine

## 2023-07-07 MED ORDER — METRONIDAZOLE 500 MG PO TABS: 2000.0000 mg | ORAL_TABLET | Freq: Once | ORAL | 0 refills | Status: AC

## 2023-07-07 NOTE — Telephone Encounter (Signed)
 Patient seen at [facility] on 07/04/2022

## 2023-07-08 ENCOUNTER — Other Ambulatory Visit: Payer: Self-pay | Admitting: *Deleted

## 2023-07-08 DIAGNOSIS — F3161 Bipolar disorder, current episode mixed, mild: Secondary | ICD-10-CM

## 2023-07-10 ENCOUNTER — Telehealth: Payer: Self-pay | Admitting: *Deleted

## 2023-07-10 NOTE — Progress Notes (Signed)
 Complex Care Management Note  Care Guide Note 07/10/2023 Name: Jimmy [name] MRN: [id number] DOB: 07/11/1990  Jimmy [name] is a 33 y.o. year old male who sees Joaquin Mulberry, MD for primary care. I reached out to Jimmy [name] by phone today to offer complex care management services.  Mr. Weinberg was given information about Complex Care Management services today including:   The Complex Care Management services include support from the care team which includes your Nurse Care Manager, Clinical Social Worker, or Pharmacist.  The Complex Care Management team is here to help remove barriers to the health concerns and goals most important to you. Complex Care Management services are voluntary, and the patient may decline or stop services at any time by request to their care team member.   Complex Care Management Consent Status: Patient did not agree to participate in complex care management services at this time.  Follow up plan:    Encounter Outcome:  Patient Refused    This patient is enrolled in a Medicaid Tailored plan. Patients enrolled in Tailored Plans are assigned to "Tailored Plan Care Management Entities" for Care Management services. The state requires that only Tailored Plan Care Management Entities provide those services because of the unique nature of needs. [facility] cannot directly provide Care Management services to patients enrolled in Tailored Plans, but we can outreach the patient to help make the connection to their health plans for Care Management services. Further information on Tailored Plans is outlined below should you need to contact the CME. [employer]  [email or web address]   Member Services: [phone]   Behavioral Health Crisis: [phone]  Please call us  if you need further clarification at [phone].  Barnie Bora  [facility]  [facility], [facility] Guide  Direct Dial: [phone]  Fax  [phone]

## 2023-08-01 ENCOUNTER — Encounter: Payer: Self-pay | Admitting: Family Medicine

## 2023-08-01 ENCOUNTER — Other Ambulatory Visit: Payer: Self-pay | Admitting: Family Medicine

## 2023-08-01 MED ORDER — UBRELVY 100 MG PO TABS: ORAL_TABLET | ORAL | 2 refills | Status: DC

## 2023-08-01 NOTE — Telephone Encounter (Signed)
 Patient is referring to Ubrelvy 

## 2023-08-04 ENCOUNTER — Other Ambulatory Visit: Payer: Self-pay

## 2023-08-04 ENCOUNTER — Telehealth: Payer: Self-pay

## 2023-08-04 NOTE — Telephone Encounter (Signed)
 Pharmacy Patient Advocate Encounter   Received notification from CoverMyMeds that prior authorization for UBRELVY  is required/requested.   Insurance verification completed.   The patient is insured through [employer] [place] .   Per test claim: PA required; PA submitted to above mentioned insurance via CoverMyMeds Key/confirmation #/EOC [id number] Status is pending

## 2023-08-05 ENCOUNTER — Other Ambulatory Visit: Payer: Self-pay

## 2023-08-14 ENCOUNTER — Ambulatory Visit
Admission: EM | Admit: 2023-08-14 | Discharge: 2023-08-14 | Disposition: A | Discharge status: Home / Self Care | Payer: MEDICAID | Attending: Nurse Practitioner | Admitting: Nurse Practitioner

## 2023-08-14 ENCOUNTER — Encounter: Payer: Self-pay | Admitting: Emergency Medicine

## 2023-08-14 ENCOUNTER — Encounter: Payer: Self-pay | Admitting: Family Medicine

## 2023-08-14 DIAGNOSIS — L237 Allergic contact dermatitis due to plants, except food: Secondary | ICD-10-CM | POA: Diagnosis not present

## 2023-08-14 MED ORDER — DEXAMETHASONE SODIUM PHOSPHATE 10 MG/ML IJ SOLN
10.0000 mg | INTRAMUSCULAR | Status: AC
  Administered 2023-08-14: 10 mg via INTRAMUSCULAR

## 2023-08-14 MED ORDER — TRIAMCINOLONE ACETONIDE 0.1 % EX CREA: 1.0000 | TOPICAL_CREAM | Freq: Two times a day (BID) | CUTANEOUS | 0 refills | Status: DC

## 2023-08-14 MED ORDER — PREDNISONE 20 MG PO TABS: 40.0000 mg | ORAL_TABLET | Freq: Every day | ORAL | 0 refills | Status: AC

## 2023-08-14 NOTE — ED Triage Notes (Signed)
 Poison ivy rash since Sunday on face and all over body.  Has been taking benadryl

## 2023-08-14 NOTE — ED Provider Notes (Signed)
 [facility]    CSN: [id number] Arrival date & time: 08/14/23  1552      History   Chief Complaint No chief complaint on file.   HPI Jimmy [name] is a 33 y.o. male.   The history is provided by the patient.   Patient presents for complaints of rash on his face and body that has been present for the past several days.  Patient states that he was outside cutting down a tree prior to symptoms starting.  States that the rash is itchy.  States that the rash started on his face and has since spread to his body.  Patient states "the rash is all over."  States he has been taking Benadryl for symptoms.  Denies exposure to new medications, lotions, foods, or detergents. Past Medical History:  Diagnosis Date   ADHD (attention deficit hyperactivity disorder)    Anxiety    Arthritis    Cancer (HCC)    skin cancer   Chest pain 08/2018   GERD (gastroesophageal reflux disease)    no meds   History of COVID-19 04/05/2020   Migraine    migraines due to pinched nerve   Pinched nerve in neck    Scoliosis    Seizure [facility])     Patient Active Problem List   Diagnosis Date Noted   Nicotine  dependence 10/28/2022   Bipolar 1 disorder (HCC) 05/07/2022   Chronic left shoulder pain 09/13/2021   History of Abnormal LFTs (liver function tests) 12/30/2019   Abnormal liver ultrasound 12/30/2019   Fatty liver 12/30/2019   Right flank pain 12/30/2019   Abdominal wall pain 12/30/2019   Gastroesophageal reflux disease without esophagitis 12/30/2019   Superior labrum anterior-to-posterior (SLAP) tear of left shoulder 10/01/2019   Carpal tunnel syndrome 09/08/2019   Bursitis of shoulder, right 08/31/2019   Incisional hernia, without obstruction or gangrene 05/20/2018   Opiate dependence (HCC) 08/02/2013    Past Surgical History:  Procedure Laterality Date   BICEPT TENODESIS Left 06/21/2020   Procedure: BICEPS TENODESIS TENOLYSIS;  Surgeon: Wes Hamman, MD;  Location: [facility];  Service: Orthopedics;  Laterality: Left;   ESOPHAGUS SURGERY     as a baby   PILONIDAL CYST EXCISION N/A 01/04/2022   Procedure: TREPHINATION OF PILONIDAL CYST;  Surgeon: Lujean Sake, MD;  Location: [facility];  Service: General;  Laterality: N/A;  sacrum   SHOULDER ARTHROSCOPY WITH BICEPS TENDON REPAIR Left 03/01/2020   Procedure: LEFT SHOULDER ARTHROSCOPY WITH BICEPS TENODESIS;  Surgeon: Wes Hamman, MD;  Location: [facility];  Service: Orthopedics;  Laterality: Left;   SHOULDER ARTHROSCOPY WITH SUBACROMIAL DECOMPRESSION Right 09/01/2019   Procedure: RIGHT SHOULDER ARTHROSCOPY WITH EXTENSIVE DEBRIDEMENT AND SUBACROMIAL DECOMPRESSION;  Surgeon: Wes Hamman, MD;  Location: [facility];  Service: Orthopedics;  Laterality: Right;   VENTRAL HERNIA REPAIR N/A 08/31/2018   Procedure: HERNIA REPAIR VENTRAL ADULT;  Surgeon: Emmalene Hare, MD;  Location: [facility];  Service: General;  Laterality: N/A;       Home Medications    Prior to Admission medications   Medication Sig Start Date End Date Taking? Authorizing Provider  predniSONE  (DELTASONE ) 20 MG tablet Take 2 tablets (40 mg total) by mouth daily with breakfast for 5 days. 08/14/23 08/19/23 Yes Leath-Warren, Belen Bowers, NP  triamcinolone  cream (KENALOG ) 0.1 % Apply 1 Application topically 2 (two) times daily. 08/14/23  Yes Leath-Warren, Belen Bowers, NP  Gauze Pads & Dressings (GAUZE DRESSING)  4"X4" PADS 4 each by Does not apply route daily at 6 (six) AM. 01/04/22   Lujean Sake, MD  QUEtiapine  (SEROQUEL ) 100 MG tablet Take 1 tablet (100 mg total) by mouth at bedtime. 06/18/23   Newlin, Enobong, MD  Ubrogepant  (UBRELVY ) 100 MG TABS Take 1 tab at the onset of a migraine. May repeat in 2 hours if migraine persist. Max daily dose 200mg  08/01/23   Joaquin Mulberry, MD    Family History Family History  Problem Relation Age of Onset   Hypertension Mother    Diabetes Mother    Kidney  cancer Mother    Diabetes Maternal Aunt    Diabetes Maternal Grandmother    Cancer Maternal Grandmother        unknown type. doing chemo   Cancer Maternal Grandfather    Stomach cancer Neg Hx    Pancreatic cancer Neg Hx    Esophageal cancer Neg Hx    Liver disease Neg Hx    Colon cancer Neg Hx    Rectal cancer Neg Hx     Social History Social History   Tobacco Use   Smoking status: Every Day    Current packs/day: 0.75    Average packs/day: 0.8 packs/day for 15.0 years (11.3 ttl pk-yrs)    Types: Cigarettes    Passive exposure: Never   Smokeless tobacco: Former  Building services engineer status: Some Days  Substance Use Topics   Alcohol use: Not Currently   Drug use: Not Currently     Allergies   Cyclobenzaprine , Depakote [divalproex sodium], Ritalin [methylphenidate hcl], Fentanyl  and related, Hydrocodone , Morphine and codeine , Oxycodone , Naproxen, Other, and Tramadol   Review of Systems Review of Systems Per HPI  Physical Exam Triage Vital Signs ED Triage Vitals  Encounter Vitals Group     BP 08/14/23 1558 121/74     Systolic BP Percentile --      Diastolic BP Percentile --      Pulse Rate 08/14/23 1558 86     Resp 08/14/23 1558 18     Temp 08/14/23 1558 98 F (36.7 C)     Temp Source 08/14/23 1558 Oral     SpO2 08/14/23 1558 95 %     Weight --      Height --      Head Circumference --      Peak Flow --      Pain Score 08/14/23 1559 0     Pain Loc --      Pain Education --      Exclude from Growth Chart --    No data found.  Updated Vital Signs BP 121/74 (BP Location: Right Arm)   Pulse 86   Temp 98 F (36.7 C) (Oral)   Resp 18   SpO2 95%   Visual Acuity Right Eye Distance:   Left Eye Distance:   Bilateral Distance:    Right Eye Near:   Left Eye Near:    Bilateral Near:     Physical Exam Vitals and nursing note reviewed.  Constitutional:      General: He is not in acute distress.    Appearance: Normal appearance.  HENT:     Head:  Normocephalic.     Mouth/Throat:     Mouth: Mucous membranes are dry.  Eyes:     Extraocular Movements: Extraocular movements intact.     Pupils: Pupils are equal, round, and reactive to light.  Pulmonary:     Effort: Pulmonary effort is  normal.  Musculoskeletal:     Cervical back: Normal range of motion.  Skin:    General: Skin is warm and dry.     Findings: Rash present. Rash is macular and papular.     Comments: Erythematous maculopapular rash noted to the generalized body surface area including the face.  Rashes and no congruent pattern.  There is no oozing, fluctuance, or drainage present.  Neurological:     General: No focal deficit present.     Mental Status: He is alert and oriented to person, place, and time.  Psychiatric:        Mood and Affect: Mood normal.        Behavior: Behavior normal.      UC Treatments / Results  Labs (all labs ordered are listed, but only abnormal results are displayed) Labs Reviewed - No data to display  EKG   Radiology No results found.  Procedures Procedures (including critical care time)  Medications Ordered in UC Medications  dexamethasone  (DECADRON ) injection 10 mg (10 mg Intramuscular Given 08/14/23 1640)    Initial Impression / Assessment and Plan / UC Course  I have reviewed the triage vital signs and the nursing notes.  Pertinent labs & imaging results that were available during my care of the patient were reviewed by me and considered in my medical decision making (see chart for details).  Patient with presents for complaints of rash to the generalized body.  Symptoms started after he was cutting down a tree.  Symptoms consistent with poison ivy dermatitis.  Decadron  10 mg IM administered.  Will treat with prednisone  40 mg for the next 5 days along with triamcinolone  cream 0.1%.  Supportive care recommendations were provided and discussed with the patient to include over-the-counter antihistamines, applying cool compresses  to the affected area, and use of Aveeno colloidal oatmeal bath.  Discussed indications with patient regarding follow-up.  Patient was in agreement with this plan of care and verbalizes understanding.  All questions were answered.  Patient stable for discharge.  Final Clinical Impressions(s) / UC Diagnoses   Final diagnoses:  Poison ivy dermatitis     Discharge Instructions      You have been given an injection of Decadron  10 mg.  Start the prednisone  on 08/15/2023. Take medication as prescribed. May also take over-the-counter Zyrtec  during the daytime and continue Benadryl at bedtime to help with itching. Avoid hot baths or showers while symptoms persist.  Recommend taking lukewarm baths. May apply cool cloths to the area to help with itching or discomfort. Avoid scratching, rubbing, or manipulating the areas while symptoms persist. Recommend over-the-counter Aveeno Colloidal Oatmeal Bath to use to help with drying and itching. Follow up if symptoms do not improve.    ED Prescriptions     Medication Sig Dispense Auth. Provider   predniSONE  (DELTASONE ) 20 MG tablet Take 2 tablets (40 mg total) by mouth daily with breakfast for 5 days. 10 tablet Leath-Warren, Belen Bowers, NP   triamcinolone  cream (KENALOG ) 0.1 % Apply 1 Application topically 2 (two) times daily. 453.6 g Leath-Warren, Belen Bowers, NP      PDMP not reviewed this encounter.   Hardy Lia, NP 08/14/23 1642

## 2023-08-14 NOTE — Discharge Instructions (Addendum)
 You have been given an injection of Decadron  10 mg.  Start the prednisone  on 08/15/2023. Take medication as prescribed. May also take over-the-counter Zyrtec  during the daytime and continue Benadryl at bedtime to help with itching. Avoid hot baths or showers while symptoms persist.  Recommend taking lukewarm baths. May apply cool cloths to the area to help with itching or discomfort. Avoid scratching, rubbing, or manipulating the areas while symptoms persist. Recommend over-the-counter Aveeno Colloidal Oatmeal Bath to use to help with drying and itching. Follow up if symptoms do not improve.

## 2023-08-18 ENCOUNTER — Other Ambulatory Visit: Payer: Self-pay

## 2023-09-26 ENCOUNTER — Encounter: Payer: Self-pay | Admitting: Family Medicine

## 2023-10-14 ENCOUNTER — Encounter: Payer: Self-pay | Admitting: Diagnostic Neuroimaging

## 2023-10-14 ENCOUNTER — Ambulatory Visit: Payer: MEDICAID | Admitting: Diagnostic Neuroimaging

## 2023-10-20 ENCOUNTER — Ambulatory Visit
Admission: EM | Admit: 2023-10-20 | Discharge: 2023-10-20 | Disposition: A | Discharge status: Home / Self Care | Payer: MEDICAID | Attending: Family Medicine | Admitting: Family Medicine

## 2023-10-20 DIAGNOSIS — S29012A Strain of muscle and tendon of back wall of thorax, initial encounter: Secondary | ICD-10-CM

## 2023-10-20 MED ORDER — KETOROLAC TROMETHAMINE 30 MG/ML IJ SOLN
30.0000 mg | Freq: Once | INTRAMUSCULAR | Status: AC
  Administered 2023-10-20: 30 mg via INTRAMUSCULAR

## 2023-10-20 MED ORDER — TIZANIDINE HCL 4 MG PO CAPS: 4.0000 mg | ORAL_CAPSULE | Freq: Three times a day (TID) | ORAL | 0 refills | Status: DC | PRN

## 2023-10-20 NOTE — ED Triage Notes (Signed)
 Upper back pain x 2 days. Pain is worse with movement and laying down.

## 2023-10-20 NOTE — ED Provider Notes (Signed)
 [facility]    CSN: [id number] Arrival date & time: 10/20/23  1452      History   Chief Complaint Chief Complaint  Patient presents with   Back Pain    HPI Jimmy [name] is a 33 y.o. male.   Patient presenting today with 2-day history of right upper back pain after doing a lot of physical work cleaning a house 2 days ago.  Denies bruising, swelling, redness, decreased range of motion, numbness tingling or weakness.  So far trying heat with mild temporary benefit.    Past Medical History:  Diagnosis Date   ADHD (attention deficit hyperactivity disorder)    Anxiety    Arthritis    Cancer (HCC)    skin cancer   Chest pain 08/2018   GERD (gastroesophageal reflux disease)    no meds   History of COVID-19 04/05/2020   Migraine    migraines due to pinched nerve   Pinched nerve in neck    Scoliosis    Seizure [facility])     Patient Active Problem List   Diagnosis Date Noted   Nicotine  dependence 10/28/2022   Bipolar 1 disorder (HCC) 05/07/2022   Chronic left shoulder pain 09/13/2021   History of Abnormal LFTs (liver function tests) 12/30/2019   Abnormal liver ultrasound 12/30/2019   Fatty liver 12/30/2019   Right flank pain 12/30/2019   Abdominal wall pain 12/30/2019   Gastroesophageal reflux disease without esophagitis 12/30/2019   Superior labrum anterior-to-posterior (SLAP) tear of left shoulder 10/01/2019   Carpal tunnel syndrome 09/08/2019   Bursitis of shoulder, right 08/31/2019   Incisional hernia, without obstruction or gangrene 05/20/2018   Opiate dependence (HCC) 08/02/2013    Past Surgical History:  Procedure Laterality Date   BICEPT TENODESIS Left 06/21/2020   Procedure: BICEPS TENODESIS TENOLYSIS;  Surgeon: Jerri Kay HERO, MD;  Location: [facility];  Service: Orthopedics;  Laterality: Left;   ESOPHAGUS SURGERY     as a baby   PILONIDAL CYST EXCISION N/A 01/04/2022   Procedure: TREPHINATION OF PILONIDAL CYST;  Surgeon: Dasie Leonor CROME, MD;  Location: [facility];  Service: General;  Laterality: N/A;  sacrum   SHOULDER ARTHROSCOPY WITH BICEPS TENDON REPAIR Left 03/01/2020   Procedure: LEFT SHOULDER ARTHROSCOPY WITH BICEPS TENODESIS;  Surgeon: Jerri Kay HERO, MD;  Location: [facility];  Service: Orthopedics;  Laterality: Left;   SHOULDER ARTHROSCOPY WITH SUBACROMIAL DECOMPRESSION Right 09/01/2019   Procedure: RIGHT SHOULDER ARTHROSCOPY WITH EXTENSIVE DEBRIDEMENT AND SUBACROMIAL DECOMPRESSION;  Surgeon: Jerri Kay HERO, MD;  Location: [facility];  Service: Orthopedics;  Laterality: Right;   VENTRAL HERNIA REPAIR N/A 08/31/2018   Procedure: HERNIA REPAIR VENTRAL ADULT;  Surgeon: Desiderio Schanz, MD;  Location: [facility];  Service: General;  Laterality: N/A;       Home Medications    Prior to Admission medications   Medication Sig Start Date End Date Taking? Authorizing Provider  tiZANidine  (ZANAFLEX ) 4 MG capsule Take 1 capsule (4 mg total) by mouth 3 (three) times daily as needed for muscle spasms. Do not drink alcohol or drive while taking this medication.  May cause drowsiness. 10/20/23  Yes Stuart Vernell Norris, PA-C  Gauze Pads & Dressings (GAUZE DRESSING) 4X4 PADS 4 each by Does not apply route daily at 6 (six) AM. 01/04/22   Dasie Leonor CROME, MD  QUEtiapine  (SEROQUEL ) 100 MG tablet Take 1 tablet (100 mg total) by mouth at bedtime. 06/18/23   Newlin, Enobong, MD  triamcinolone  cream (KENALOG ) 0.1 % Apply 1 Application topically 2 (two) times daily. 08/14/23   Leath-Warren, Etta PARAS, NP  Ubrogepant  (UBRELVY ) 100 MG TABS Take 1 tab at the onset of a migraine. May repeat in 2 hours if migraine persist. Max daily dose 200mg  08/01/23   Delbert Clam, MD    Family History Family History  Problem Relation Age of Onset   Hypertension Mother    Diabetes Mother    Kidney cancer Mother    Diabetes Maternal Aunt    Diabetes Maternal Grandmother    Cancer Maternal Grandmother         unknown type. doing chemo   Cancer Maternal Grandfather    Stomach cancer Neg Hx    Pancreatic cancer Neg Hx    Esophageal cancer Neg Hx    Liver disease Neg Hx    Colon cancer Neg Hx    Rectal cancer Neg Hx     Social History Social History   Tobacco Use   Smoking status: Every Day    Current packs/day: 0.75    Average packs/day: 0.8 packs/day for 15.0 years (11.3 ttl pk-yrs)    Types: Cigarettes    Passive exposure: Never   Smokeless tobacco: Former  Building services engineer status: Some Days  Substance Use Topics   Alcohol use: Not Currently   Drug use: Not Currently     Allergies   Cyclobenzaprine , Depakote [divalproex sodium], Ritalin [methylphenidate hcl], Fentanyl  and related, Hydrocodone , Morphine and codeine , Oxycodone , Naproxen, Other, and Tramadol   Review of Systems Review of Systems Per HPI  Physical Exam Triage Vital Signs ED Triage Vitals  Encounter Vitals Group     BP 10/20/23 1525 113/74     Girls Systolic BP Percentile --      Girls Diastolic BP Percentile --      Boys Systolic BP Percentile --      Boys Diastolic BP Percentile --      Pulse Rate 10/20/23 1525 75     Resp 10/20/23 1525 14     Temp 10/20/23 1525 98.6 F (37 C)     Temp src --      SpO2 10/20/23 1525 94 %     Weight --      Height --      Head Circumference --      Peak Flow --      Pain Score 10/20/23 1526 7     Pain Loc --      Pain Education --      Exclude from Growth Chart --    No data found.  Updated Vital Signs BP 113/74 (BP Location: Right Arm)   Pulse 75   Temp 98.6 F (37 C)   Resp 14   SpO2 94%   Visual Acuity Right Eye Distance:   Left Eye Distance:   Bilateral Distance:    Right Eye Near:   Left Eye Near:    Bilateral Near:     Physical Exam Vitals and nursing note reviewed.  Constitutional:      Appearance: Normal appearance.  HENT:     Head: Atraumatic.  Eyes:     Extraocular Movements: Extraocular movements intact.      Conjunctiva/sclera: Conjunctivae normal.  Cardiovascular:     Rate and Rhythm: Normal rate.  Pulmonary:     Effort: Pulmonary effort is normal.  Musculoskeletal:        General: Tenderness present. No swelling or deformity. Normal range of motion.  Cervical back: Normal range of motion and neck supple.     Comments: Right upper back musculature tender to palpation.  No midline spinal tenderness to palpation diffusely.  Normal gait and range of motion  Skin:    General: Skin is warm and dry.     Findings: No bruising.  Neurological:     Mental Status: He is oriented to person, place, and time.     Comments: Right upper extremity neurovascularly intact  Psychiatric:        Mood and Affect: Mood normal.        Thought Content: Thought content normal.        Judgment: Judgment normal.      UC Treatments / Results  Labs (all labs ordered are listed, but only abnormal results are displayed) Labs Reviewed - No data to display  EKG   Radiology No results found.  Procedures Procedures (including critical care time)  Medications Ordered in UC Medications  ketorolac  (TORADOL ) 30 MG/ML injection 30 mg (has no administration in time range)    Initial Impression / Assessment and Plan / UC Course  I have reviewed the triage vital signs and the nursing notes.  Pertinent labs & imaging results that were available during my care of the patient were reviewed by me and considered in my medical decision making (see chart for details).     Muscle strain of the right upper back, treat with IM Toradol , Zanaflex , heat,/, stretches, rest.  Work note given.  Return for worsening symptoms.  Final Clinical Impressions(s) / UC Diagnoses   Final diagnoses:  Muscle strain of right upper back, initial encounter   Discharge Instructions   None    ED Prescriptions     Medication Sig Dispense Auth. Provider   tiZANidine  (ZANAFLEX ) 4 MG capsule Take 1 capsule (4 mg total) by mouth 3  (three) times daily as needed for muscle spasms. Do not drink alcohol or drive while taking this medication.  May cause drowsiness. 15 capsule Stuart Vernell Norris, [place]      PDMP not reviewed this encounter.   Stuart Vernell Norris, [place] 10/20/23 1640

## 2023-12-17 ENCOUNTER — Telehealth: Payer: Self-pay | Admitting: Family Medicine

## 2023-12-17 NOTE — Telephone Encounter (Signed)
 pt unconfirmed appt (per vr lvm

## 2023-12-18 ENCOUNTER — Ambulatory Visit: Payer: MEDICAID | Attending: Family Medicine | Admitting: Family Medicine

## 2023-12-18 VITALS — BP 138/80 | HR 83 | Ht 64.0 in | Wt 171.2 lb

## 2023-12-18 DIAGNOSIS — Z13220 Encounter for screening for lipoid disorders: Secondary | ICD-10-CM | POA: Diagnosis not present

## 2023-12-18 DIAGNOSIS — D179 Benign lipomatous neoplasm, unspecified: Secondary | ICD-10-CM

## 2023-12-18 DIAGNOSIS — L309 Dermatitis, unspecified: Secondary | ICD-10-CM

## 2023-12-18 DIAGNOSIS — G43809 Other migraine, not intractable, without status migrainosus: Secondary | ICD-10-CM | POA: Diagnosis not present

## 2023-12-18 DIAGNOSIS — F3161 Bipolar disorder, current episode mixed, mild: Secondary | ICD-10-CM | POA: Diagnosis not present

## 2023-12-18 DIAGNOSIS — F17209 Nicotine dependence, unspecified, with unspecified nicotine-induced disorders: Secondary | ICD-10-CM

## 2023-12-18 DIAGNOSIS — Z136 Encounter for screening for cardiovascular disorders: Secondary | ICD-10-CM

## 2023-12-18 MED ORDER — TRIAMCINOLONE ACETONIDE 0.1 % EX CREA: 1.0000 | TOPICAL_CREAM | Freq: Two times a day (BID) | CUTANEOUS | 0 refills | Status: DC

## 2023-12-18 MED ORDER — BUPROPION HCL ER (SR) 150 MG PO TB12
150.0000 mg | ORAL_TABLET | Freq: Two times a day (BID) | ORAL | 1 refills | Status: AC
Start: 2023-12-18 — End: ?

## 2023-12-18 MED ORDER — UBRELVY 100 MG PO TABS: ORAL_TABLET | ORAL | 2 refills | Status: AC

## 2023-12-18 NOTE — Progress Notes (Signed)
 Subjective:  Patient ID: Jimmy [name], male    DOB: Jan 23, 1991  Age: 33 y.o. MRN: [id number]  CC: Medical Management of Chronic Issues (Rash under right arm/Knot under skin on arms)     Discussed the use of AI scribe software for clinical note transcription with the patient, who gave verbal consent to proceed.  History of Present Illness STEPHENSON [name] is a 33 year old male with a history of migraines, bipolar disorder, insomnia, chronic left shoulder pain (status post left shoulder arthroscopy, biceps tenodesis tenolysis in 2021 and 2022), Opioid dependence, nicotine  dependence who presents with a rash under his right arm and multiple subcutaneous nodules.  The rash under his right arm is itchy, has been present for a couple of days, and is increasing in size. He recently changed his deodorant. There is no associated pain.  Multiple subcutaneous nodules, described as cyst-like, are present under his arm and on his legs. These nodules are not painful. He has had one for a while and has noticed an increase in its frequency.  He is currently taking Ubrelvy  for migraines, which is effective. He has stopped taking Seroquel , previously used for bipolar disorder and sleep issues. He was in contact with behavioral health services but needs to follow up with his case worker.  His last blood work in April showed normal kidney and liver function, normal total cholesterol, and no anemia or diabetes. He acknowledges not eating right. He has not received a flu shot due to previous adverse reactions.    Past Medical History:  Diagnosis Date   ADHD (attention deficit hyperactivity disorder)    Anxiety    Arthritis    Cancer (HCC)    skin cancer   Chest pain 08/2018   GERD (gastroesophageal reflux disease)    no meds   History of COVID-19 04/05/2020   Migraine    migraines due to pinched nerve   Pinched nerve in neck    Scoliosis    Seizure [facility])     Past Surgical History:  Procedure  Laterality Date   BICEPT TENODESIS Left 06/21/2020   Procedure: BICEPS TENODESIS TENOLYSIS;  Surgeon: Jerri Kay HERO, MD;  Location: [facility];  Service: Orthopedics;  Laterality: Left;   ESOPHAGUS SURGERY     as a baby   PILONIDAL CYST EXCISION N/A 01/04/2022   Procedure: TREPHINATION OF PILONIDAL CYST;  Surgeon: Dasie Leonor CROME, MD;  Location: [facility];  Service: General;  Laterality: N/A;  sacrum   SHOULDER ARTHROSCOPY WITH BICEPS TENDON REPAIR Left 03/01/2020   Procedure: LEFT SHOULDER ARTHROSCOPY WITH BICEPS TENODESIS;  Surgeon: Jerri Kay HERO, MD;  Location: [facility];  Service: Orthopedics;  Laterality: Left;   SHOULDER ARTHROSCOPY WITH SUBACROMIAL DECOMPRESSION Right 09/01/2019   Procedure: RIGHT SHOULDER ARTHROSCOPY WITH EXTENSIVE DEBRIDEMENT AND SUBACROMIAL DECOMPRESSION;  Surgeon: Jerri Kay HERO, MD;  Location: [facility];  Service: Orthopedics;  Laterality: Right;   VENTRAL HERNIA REPAIR N/A 08/31/2018   Procedure: HERNIA REPAIR VENTRAL ADULT;  Surgeon: Desiderio Schanz, MD;  Location: [facility];  Service: General;  Laterality: N/A;    Family History  Problem Relation Age of Onset   Hypertension Mother    Diabetes Mother    Kidney cancer Mother    Diabetes Maternal Aunt    Diabetes Maternal Grandmother    Cancer Maternal Grandmother        unknown type. doing chemo   Cancer Maternal Grandfather    Stomach cancer  Neg Hx    Pancreatic cancer Neg Hx    Esophageal cancer Neg Hx    Liver disease Neg Hx    Colon cancer Neg Hx    Rectal cancer Neg Hx     Social History   Socioeconomic History   Marital status: Married    Spouse name: Not on file   Number of children: 3   Years of education: Not on file   Highest education level: 11th grade  Occupational History   Occupation: Holiday representative work  Tobacco Use   Smoking status: Every Day    Current packs/day: 0.75    Average packs/day: 0.8 packs/day for 15.0 years  (11.3 ttl pk-yrs)    Types: Cigarettes    Passive exposure: Never   Smokeless tobacco: Former  Building services engineer status: Some Days  Substance and Sexual Activity   Alcohol use: Not Currently   Drug use: Not Currently   Sexual activity: Yes  Other Topics Concern   Not on file  Social History Narrative   Right handed   One story home    Drinks caffeine    Social Drivers of Health   Financial Resource Strain: High Risk (12/18/2023)   Overall Financial Resource Strain (CARDIA)    Difficulty of Paying Living Expenses: Very hard  Food Insecurity: Food Insecurity Present (12/18/2023)   Hunger Vital Sign    Worried About Running Out of Food in the Last Year: Sometimes true    Ran Out of Food in the Last Year: Sometimes true  Transportation Needs: No Transportation Needs (12/18/2023)   PRAPARE - Administrator, Civil Service (Medical): No    Lack of Transportation (Non-Medical): No  Physical Activity: Sufficiently Active (12/18/2023)   Exercise Vital Sign    Days of Exercise per Week: 7 days    Minutes of Exercise per Session: 150+ min  Stress: Stress Concern Present (12/18/2023)   [employer] of Occupational Health - Occupational Stress Questionnaire    Feeling of Stress: To some extent  Social Connections: Socially Isolated (12/18/2023)   Social Connection and Isolation Panel    Frequency of Communication with Friends and Family: More than three times a week    Frequency of Social Gatherings with Friends and Family: More than three times a week    Attends Religious Services: Never    Database administrator or Organizations: No    Attends Engineer, structural: Not on file    Marital Status: Separated    Allergies  Allergen Reactions   Cyclobenzaprine  Hives and Nausea Only   Depakote [Divalproex Sodium] Other (See Comments)    made him crazy   Ritalin [Methylphenidate Hcl] Other (See Comments)    Increased hyper activeness    Fentanyl  And Related  Other (See Comments)    In recovery   Hydrocodone  Other (See Comments)    In recovery   Morphine And Codeine  Other (See Comments)    recovery   Oxycodone  Other (See Comments)    In recovery   Naproxen Nausea Only    Acid reflux   Other Other (See Comments)    [facility]-pt states skin became reddened and he tasted metal (08-2018)   Tramadol Other (See Comments)    Acid reflux    Outpatient Medications Prior to Visit  Medication Sig Dispense Refill   Ubrogepant  (UBRELVY ) 100 MG TABS Take 1 tab at the onset of a migraine. May repeat in 2 hours if migraine persist. Max daily dose  200mg  10 tablet 2   Gauze Pads & Dressings (GAUZE DRESSING) 4X4 PADS 4 each by Does not apply route daily at 6 (six) AM. (Patient not taking: Reported on 12/18/2023) 24 each 1   QUEtiapine  (SEROQUEL ) 100 MG tablet Take 1 tablet (100 mg total) by mouth at bedtime. (Patient not taking: Reported on 12/18/2023) 90 tablet 1   tiZANidine  (ZANAFLEX ) 4 MG capsule Take 1 capsule (4 mg total) by mouth 3 (three) times daily as needed for muscle spasms. Do not drink alcohol or drive while taking this medication.  May cause drowsiness. (Patient not taking: Reported on 12/18/2023) 15 capsule 0   triamcinolone  cream (KENALOG ) 0.1 % Apply 1 Application topically 2 (two) times daily. (Patient not taking: Reported on 12/18/2023) 453.6 g 0   Facility-Administered Medications Prior to Visit  Medication Dose Route Frequency Provider Last Rate Last Admin   triamcinolone  acetonide (KENALOG ) 10 MG/ML injection 20 mg  20 mg Other Once Sheffield, Kelli R, PA-C         ROS Review of Systems  Constitutional:  Negative for activity change and appetite change.  HENT:  Negative for sinus pressure and sore throat.   Respiratory:  Negative for chest tightness, shortness of breath and wheezing.   Cardiovascular:  Negative for chest pain and palpitations.  Gastrointestinal:  Negative for abdominal distention, abdominal pain and constipation.   Genitourinary: Negative.   Musculoskeletal: Negative.   Psychiatric/Behavioral:  Negative for behavioral problems and dysphoric mood.     Objective:  BP 138/80   Pulse 83   Ht 5' 4 (1.626 m)   Wt 171 lb 3.2 oz (77.7 kg)   SpO2 98%   BMI 29.39 kg/m      12/18/2023    8:58 AM 10/20/2023    3:25 PM 08/14/2023    3:58 PM  BP/Weight  Systolic BP 138 113 121  Diastolic BP 80 74 74  Wt. (Lbs) 171.2    BMI 29.39 kg/m2        Physical Exam Constitutional:      Appearance: He is well-developed.  Cardiovascular:     Rate and Rhythm: Normal rate.     Heart sounds: Normal heart sounds. No murmur heard. Pulmonary:     Effort: Pulmonary effort is normal.     Breath sounds: Normal breath sounds. No wheezing or rales.  Chest:     Chest wall: No tenderness.  Abdominal:     General: Bowel sounds are normal. There is no distension.     Palpations: Abdomen is soft. There is no mass.     Tenderness: There is no abdominal tenderness.  Musculoskeletal:        General: Normal range of motion.     Right lower leg: No edema.     Left lower leg: No edema.  Skin:    Comments: Right axilla with erythematous patch measuring 3 x 4 cm Soft fluctuant subcutaneous lesions sparsely distributed in bilateral upper and lower extremities which are not tender  Neurological:     Mental Status: He is alert and oriented to person, place, and time.  Psychiatric:        Mood and Affect: Mood normal.        Latest Ref Rng & Units 06/18/2023   10:50 AM 03/07/2022    9:10 AM 11/15/2021    9:54 AM  CMP  Glucose 70 - 99 mg/dL 89  90  892   BUN 6 - 20 mg/dL 13  16  13  Creatinine 0.76 - 1.27 mg/dL 9.20  9.16  9.30   Sodium 134 - 144 mmol/L 140  137  139   Potassium 3.5 - 5.2 mmol/L 4.7  4.4  4.7   Chloride 96 - 106 mmol/L 105  101  110   CO2 20 - 29 mmol/L 22  21  25    Calcium 8.7 - 10.2 mg/dL 9.6  9.6  9.2   Total Protein 6.0 - 8.5 g/dL 6.6  7.0    Total Bilirubin 0.0 - 1.2 mg/dL 0.3  <9.7     Alkaline Phos 44 - 121 IU/L 86  97    AST 0 - 40 IU/L 16  14    ALT 0 - 44 IU/L 24  17      Lipid Panel     Component Value Date/Time   CHOL 193 06/18/2023 1050   TRIG 70 06/18/2023 1050   HDL 49 06/18/2023 1050   CHOLHDL 4.7 04/21/2020 1051   LDLCALC 131 (H) 06/18/2023 1050   LDLCALC 137 (H) 04/21/2020 1051    CBC    Component Value Date/Time   WBC 8.1 06/18/2023 1050   WBC 14.9 (H) 11/15/2021 0954   RBC 5.16 06/18/2023 1050   RBC 4.64 11/15/2021 0954   HGB 15.6 06/18/2023 1050   HCT 46.2 06/18/2023 1050   PLT 292 06/18/2023 1050   MCV 90 06/18/2023 1050   MCH 30.2 06/18/2023 1050   MCH 30.6 11/15/2021 0954   MCHC 33.8 06/18/2023 1050   MCHC 33.6 11/15/2021 0954   RDW 12.3 06/18/2023 1050   LYMPHSABS 2.7 06/18/2023 1050   MONOABS 0.8 11/15/2021 0954   EOSABS 0.1 06/18/2023 1050   BASOSABS 0.1 06/18/2023 1050    Lab Results  Component Value Date   HGBA1C 5.6 06/18/2023       Assessment & Plan  Dermatitis, right axilla Itchy rash in the right axilla, possibly due to a reaction to a change in deodorant. Considered a form of contact dermatitis. - Advise to use triamcinolone  cream on the affected area. - Recommend switching to an unscented or plain deodorant.  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 Discussed the possibility of referral to a specialist for further evaluation or excision. - Refer to a specialist for evaluation of lipomas per patient request.  Referral to general surgery placed.  Nicotine  dependence Ongoing nicotine  dependence. Previous attempt with nicotine  patches was unsuccessful. Discussed starting bupropion as a smoking cessation aid. - Prescribe bupropion, starting with one tablet once a day for three days, then increase to one tablet twice a day.  Migraine Migraine management with Ubrelvy , which is effective. - Refill Ubrelvy  prescription.  Bipolar disorder Bipolar disorder  previously managed with Seroquel , which he has stopped. Sleep has improved. Discussed the importance of follow-up with behavioral health services. - Encourage follow-up with case worker for behavioral health services.     Healthcare maintenance Encounter for lipid screening for cardiovascular disorder-lipid panel ordered  Meds ordered this encounter  Medications   Ubrogepant  (UBRELVY ) 100 MG TABS    Sig: Take 1 tab at the onset of a migraine. May repeat in 2 hours if migraine persist. Max daily dose 200mg     Dispense:  10 tablet    Refill:  2   buPROPion (WELLBUTRIN SR) 150 MG 12 hr tablet    Sig: Take 1 tablet (150 mg total) by mouth 2 (two) times daily. Start at 1 tab daily for 3 days then increase to  1 tab twice daily    Dispense:  180 tablet    Refill:  1   triamcinolone  cream (KENALOG ) 0.1 %    Sig: Apply 1 Application topically 2 (two) times daily.    Dispense:  45 g    Refill:  0    Follow-up: Return in about 6 months (around 06/17/2024) for Chronic medical conditions.       Corrina Sabin, MD, FAAFP. [facility] [place], [place] [phone]   12/18/2023, 12:20 PM

## 2023-12-19 ENCOUNTER — Other Ambulatory Visit: Payer: MEDICAID

## 2024-01-01 ENCOUNTER — Other Ambulatory Visit: Payer: Self-pay

## 2024-01-01 ENCOUNTER — Telehealth: Payer: Self-pay | Admitting: Family Medicine

## 2024-01-01 ENCOUNTER — Encounter: Payer: Self-pay | Admitting: Family Medicine

## 2024-01-01 DIAGNOSIS — Z113 Encounter for screening for infections with a predominantly sexual mode of transmission: Secondary | ICD-10-CM

## 2024-01-01 NOTE — Telephone Encounter (Signed)
 Done

## 2024-01-01 NOTE — Telephone Encounter (Signed)
 Copied from CRM [id number]. Topic: Clinical - Request for Lab/Test Order >> Jan 01, 2024  9:21 AM Jimmy [name] wrote:  Reason for CRM:  [name] wants to be tested for STD when he comes in on 01/05/24. Please add order to his chart if he can have the test. His finance tested positive for Hep C and her lab results said reactive and abnormal. Please call him with any questions at [phone].

## 2024-01-01 NOTE — Telephone Encounter (Signed)
 Patient requesting STD screening and Hep C testing to be added for his upcoming lab appointment.

## 2024-01-01 NOTE — Addendum Note (Signed)
 Addended by: Taiana Temkin on: 01/01/2024 01:44 PM   Modules accepted: Orders

## 2024-01-05 ENCOUNTER — Encounter: Payer: Self-pay | Admitting: Surgery

## 2024-01-05 ENCOUNTER — Ambulatory Visit: Payer: MEDICAID | Admitting: Surgery

## 2024-01-05 ENCOUNTER — Ambulatory Visit: Payer: MEDICAID | Attending: Family Medicine

## 2024-01-05 ENCOUNTER — Other Ambulatory Visit (HOSPITAL_COMMUNITY)
Admission: RE | Admit: 2024-01-05 | Discharge: 2024-01-05 | Disposition: A | Discharge status: Home / Self Care | Payer: MEDICAID | Source: Ambulatory Visit | Attending: Family Medicine | Admitting: Family Medicine

## 2024-01-05 VITALS — BP 120/79 | HR 84 | Temp 98.0°F | Ht 64.0 in | Wt 173.6 lb

## 2024-01-05 DIAGNOSIS — D1721 Benign lipomatous neoplasm of skin and subcutaneous tissue of right arm: Secondary | ICD-10-CM

## 2024-01-05 DIAGNOSIS — D1722 Benign lipomatous neoplasm of skin and subcutaneous tissue of left arm: Secondary | ICD-10-CM

## 2024-01-05 DIAGNOSIS — D1724 Benign lipomatous neoplasm of skin and subcutaneous tissue of left leg: Secondary | ICD-10-CM

## 2024-01-05 DIAGNOSIS — K432 Incisional hernia without obstruction or gangrene: Secondary | ICD-10-CM

## 2024-01-05 DIAGNOSIS — Z113 Encounter for screening for infections with a predominantly sexual mode of transmission: Secondary | ICD-10-CM | POA: Diagnosis present

## 2024-01-05 DIAGNOSIS — D1723 Benign lipomatous neoplasm of skin and subcutaneous tissue of right leg: Secondary | ICD-10-CM

## 2024-01-05 DIAGNOSIS — D1720 Benign lipomatous neoplasm of skin and subcutaneous tissue of unspecified limb: Secondary | ICD-10-CM

## 2024-01-05 DIAGNOSIS — Z13220 Encounter for screening for lipoid disorders: Secondary | ICD-10-CM

## 2024-01-05 NOTE — Patient Instructions (Addendum)
 You have requested to have a Ventral Hernia Repair. This will be done by Dr Desiderio at [facility]. Please see your (BLUE) Pre-care sheet for more information. Our surgery scheduler will call you to look at surgery dates and to go over surgery information.   If you are on any injectable weight loss medication, you will need to stop taking your GLP-1 injectable (weight loss) medications 8 days before your surgery to avoid any complications with anesthesia.   You will need to arrange to be out of work for approximately 1-2 weeks and then you may return with a lifting restriction for 4 more weeks. If you have FMLA or Disability paperwork that needs to be filled out, please have your company fax your paperwork to [phone] or you may drop this by either office. This paperwork will be filled out within 3 days after your surgery has been completed.     Ventral Hernia A ventral hernia (also called an incisional hernia) is a hernia that occurs at the site of a previous surgical cut (incision) in the abdomen. The abdominal wall spans from your lower chest down to your pelvis. If the abdominal wall is weakened from a surgical incision, a hernia can occur. A hernia is a bulge of bowel or muscle tissue pushing out on the weakened part of the abdominal wall. Ventral hernias can get bigger from straining or lifting. Obese and older people are at higher risk for a ventral hernia. People who develop infections after surgery or require repeat incisions at the same site on the abdomen are also at increased risk. CAUSES  A ventral hernia occurs because of weakness in the abdominal wall at an incision site.  SYMPTOMS  Common symptoms include: A visible bulge or lump on the abdominal wall. Pain or tenderness around the lump. Increased discomfort if you cough or make a sudden movement. If the hernia has blocked part of the intestine, a serious complication can occur (incarcerated or strangulated hernia). This can become a  problem that requires emergency surgery because the blood flow to the blocked intestine may be cut off. Symptoms may include: Feeling sick to your stomach (nauseous). Throwing up (vomiting). Stomach swelling (distention) or bloating. Fever. Rapid heartbeat. DIAGNOSIS  Your health care provider will take a medical history and perform a physical exam. Various tests may be ordered, such as: Blood tests. Urine tests. Ultrasonography. X-rays. Computed tomography (CT). TREATMENT  Watchful waiting may be all that is needed for a smaller hernia that does not cause symptoms. Your health care provider may recommend the use of a supportive belt (truss) that helps to keep the abdominal wall intact. For larger hernias or those that cause pain, surgery to repair the hernia is usually recommended. If a hernia becomes strangulated, emergency surgery needs to be done right away. HOME CARE INSTRUCTIONS Avoid putting pressure or strain on the abdominal area. Avoid heavy lifting. Use good body positioning for physical tasks. Ask your health care provider about proper body positioning. Use a supportive belt as directed by your health care provider. Maintain a healthy weight. Eat foods that are high in fiber, such as whole grains, fruits, and vegetables. Fiber helps prevent difficult bowel movements (constipation). Drink enough fluids to keep your urine clear or pale yellow. Follow up with your health care provider as directed. SEEK MEDICAL CARE IF:  Your hernia seems to be getting larger or more painful. SEEK IMMEDIATE MEDICAL CARE IF:  You have abdominal pain that is sudden and sharp. Your pain  becomes severe. You have repeated vomiting. You are sweating a lot. You notice a rapid heartbeat. You develop a fever. MAKE SURE YOU:  Understand these instructions. Will watch your condition. Will get help right away if you are not doing well or get worse.     Open Ventral Hernia Repair Open ventral  hernia repair is a surgery to fix a ventral hernia. A ventral hernia,  is a bulge of body tissue or intestines that pushes through the front part of the abdomen. This can happen if the connective tissue covering the muscles over the abdomen has a weak spot or is torn because of a surgical cut (incision) from a previous surgery. A ventral hernia repair is often done soon after diagnosis to stop the hernia from getting bigger, becoming uncomfortable, or becoming an emergency. This surgery usually takes about 2 hours, but the time can vary greatly.  LET [facility] CARE PROVIDER KNOW ABOUT: Any allergies you have. All medicines you are taking, including steroids, vitamins, herbs, eye drops, creams, and over-the-counter medicines. Previous problems you or members of your family have had with the use of anesthetics. Any blood disorders you have. Previous surgeries you have had. Medical conditions you have.  RISKS AND COMPLICATIONS  Generally, Open ventral hernia repair is a safe procedure. However, as with any surgical procedure, problems can occur. Possible problems include: Bleeding. Trouble passing urine or having a bowel movement after the surgery. Infection. Pneumonia. Blood clots. Pain in the area of the hernia. A bulge in the area of the hernia that may be caused by a collection of fluid. Injury to intestines or other structures in the abdomen. Return of the hernia after surgery.  BEFORE THE PROCEDURE  You may need to have blood tests, urine tests, a chest X-ray, or an electrocardiogram done before the day of the surgery. Ask your health care provider about changing or stopping your regular medicines. This is especially important if you are taking diabetes medicines or blood thinners. You may need to wash with a special type of germ-killing soap. Do not eat or drink anything after midnight the night before the procedure or as directed by your health care provider. Make plans to have  someone drive you home after the procedure.  PROCEDURE  Small monitors will be put on your body. They are used to check your heart, blood pressure, and oxygen level. An IV access tube will be put into a vein in your hand or arm. Fluids and medicine will flow directly into your body through the IV tube. You will be given medicine that makes you go to sleep (general anesthetic). Your abdomen will be cleaned with a special soap to kill any germs on your skin. Once you are asleep, a moderate - large size incision will be made in your abdomen. The size of incision depends on how large your hernia is. Your surgeon puts the tissue or intestines that formed the hernia back in place. A screen-like patch (mesh) is used to close the hernia. This helps make the area stronger. Stitches, tacks, or staples are used to keep the mesh in place. Medicine and a bandage (dressing) or skin glue will be put over the incision.  AFTER THE PROCEDURE  You will stay in a recovery area until the anesthetic wears off. Your blood pressure and pulse will be checked often. You may be able to go home the same day or may need to stay in the hospital for 1-2 days after surgery. Your  surgeon will decide when you can go home depending upon your recovery. You may feel some pain. You will be given medicine for pain. You will be urged to do breathing exercises that involve taking deep breaths. This helps prevent a lung infection after a surgery. You may have to wear compression stockings while you are in the hospital. These stockings help keep blood clots from forming in your legs.   This information is not intended to replace advice given to you by your health care provider. Make sure you discuss any questions you have with your health care provider.   Document Released: 02/19/2012 Document Revised: 03/09/2013 Document Reviewed: 02/19/2012 Elsevier Interactive Patient Education [employer]   We have spoken today about  removing a Lipoma. This will be done by Dr. Desiderio at [facility]  If you are on any injectable weight loss medication, you will need to stop taking your GLP-1 injectable (weight loss) medications 8 days before your surgery to avoid any complications with anesthesia.   You will most likely be able to leave the hospital several hours after your surgery. You may need to have a drain placed after surgery, this depends on the size of your lipoma.  Plan to tentatively be off work for up to1 week following the surgery.  Please see your Blue surgery sheet for more information. Our surgery scheduler will call you to look at surgery dates and to go over information.   If you have FMLA or Disability paperwork that needs to be filled out, please have your company fax your paperwork to [phone] or you may drop this by either office. This paperwork will be filled out within 3 days after your surgery has been completed.  Lipoma Removal  Lipoma removal is a surgical procedure to remove a lipoma, which is a noncancerous (benign) tumor that is made up of fat cells. Most lipomas are small and painless and do not require treatment. They can form in many areas of the body but are most common under the skin of the back, arms, shoulders, buttocks, and thighs. You may need lipoma removal if you have a lipoma that is large, growing, or causing discomfort. Lipoma removal may also be done for cosmetic reasons. Tell a health care provider about: Any allergies you have. All medicines you are taking, including vitamins, herbs, eye drops, creams, and over-the-counter medicines. Any problems you or family members have had with anesthetic medicines. Any bleeding problems you have. Any surgeries you have had. Any medical conditions you have. Whether you are pregnant or may be pregnant. What are the risks? Generally, this is a safe procedure. However, problems may occur,  including: Infection. Bleeding. Scarring. Allergic reactions to medicines. Damage to nearby structures or organs, such as damage to nerves or blood vessels near the lipoma. What happens before the procedure? Medicines Ask your health care provider about: Changing or stopping your regular medicines. This is especially important if you are taking diabetes medicines or blood thinners. Taking medicines such as aspirin and ibuprofen . These medicines can thin your blood. Do not take these medicines unless your health care provider tells you to take them. Taking over-the-counter medicines, vitamins, herbs, and supplements. General instructions You will have a physical exam. Your health care provider will check the size of the lipoma and whether it can be removed easily. You may have a biopsy and imaging tests, such as X-rays, a CT scan, and an MRI. Do not use any products that contain nicotine  or  tobacco for at least 4 weeks before the procedure. These products include cigarettes, chewing tobacco, and vaping devices, such as e-cigarettes. If you need help quitting, ask your health care provider. Ask your health care provider: How your surgery site will be marked. What steps will be taken to help prevent infection. These may include: Washing skin with a germ-killing soap. Taking antibiotic medicine. If you will be going home right after the procedure, plan to have a responsible adult: Take you home from the hospital or clinic. You will not be allowed to drive. Care for you for the time you are told. What happens during the procedure?  An IV will be inserted into one of your veins. You will be given one or more of the following: A medicine to help you relax (sedative). A medicine to numb the area (local anesthetic). A medicine to make you fall asleep (general anesthetic). A medicine that is injected into an area of your body to numb everything below the injection site (regional anesthetic). An  incision will be made into the skin over the lipoma or very near the lipoma. The incision may be made in a natural skin line or crease. Tissues, nerves, and blood vessels near the lipoma will be moved out of the way. The lipoma and the capsule that surrounds it will be separated from the surrounding tissues. The lipoma will be removed. You may have a drain placed depending on the size of your lipoma The incision may be closed with stitches (sutures). A bandage (dressing) will be placed over the incision. The procedure may vary among health care providers and hospitals. What happens after the procedure? Your blood pressure, heart rate, breathing rate, and blood oxygen level will be monitored until you leave the hospital or clinic. If you were prescribed an antibiotic medicine, use it as told by your health care provider. Do not stop using the antibiotic even if you start to feel better. If you were given a sedative during the procedure, it can affect you for several hours. Do not drive or operate machinery until your health care provider says that it is safe. Where to find more information OrthoInfo: orthoinfo.aaos.org Summary Before the procedure, follow instructions from your health care provider about eating and drinking, and changing or stopping your regular medicines. This is especially important if you are taking diabetes medicines or blood thinners. After the lipoma is removed, the incision may be closed with stitches (sutures) and covered with a bandage (dressing). If you were given a sedative during the procedure, it can affect you for several hours. Do not drive or operate machinery until your health care provider says that it is safe.

## 2024-01-05 NOTE — Progress Notes (Signed)
 01/05/2024  Reason for Visit:  Multiple lipomas and recurrent ventral hernia  Requesting Provider:  Corrina Sabin, MD  History of Present Illness: Jimmy [name] is a 33 y.o. 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  The patient had an open incisional ventral hernia repair on 08/31/2018 and reports that recently he has felt some more discomfort in the epigastric area at the site of the hernia repair.  Denies any worsening pain but overall progressing discomfort.  The area bulges when he is doing anything strenuous or coughing hard.  Denies any nausea or vomiting, constipation or diarrhea issues.  He also reports that for the last few years, likely about 5 years, he has also noted multiple lipomas developing in his extremities.  At first he only had 1 or 2 in the upper arms and these have multiplied over the years.  He overall feels about 7 lipomas.  The lipomas are more bothersome and are in the upper extremities he reports having 3 in the right arm and 1 in the left arm.  These have been slowly growing in size with some mild discomfort particularly in the right upper arm.  He denies any areas of redness or skin ulceration or drainage.  Past Medical History: Past Medical History:  Diagnosis Date   ADHD (attention deficit hyperactivity disorder)    Anxiety    Arthritis    Cancer (HCC)    skin cancer   Chest pain 08/2018   GERD (gastroesophageal reflux disease)    no meds   History of COVID-19 04/05/2020   Migraine    migraines due to pinched nerve   Pinched nerve in neck    Scoliosis    Seizure [facility])      Past Surgical History: Past Surgical History:  Procedure Laterality Date   BICEPT TENODESIS Left 06/21/2020   Procedure: BICEPS TENODESIS TENOLYSIS;  Surgeon: Jerri Kay HERO, MD;  Location: [facility];  Service: Orthopedics;  Laterality: Left;   ESOPHAGUS SURGERY     as a baby    PILONIDAL CYST EXCISION N/A 01/04/2022   Procedure: TREPHINATION OF PILONIDAL CYST;  Surgeon: Dasie Leonor CROME, MD;  Location: [facility];  Service: General;  Laterality: N/A;  sacrum   SHOULDER ARTHROSCOPY WITH BICEPS TENDON REPAIR Left 03/01/2020   Procedure: LEFT SHOULDER ARTHROSCOPY WITH BICEPS TENODESIS;  Surgeon: Jerri Kay HERO, MD;  Location: [facility];  Service: Orthopedics;  Laterality: Left;   SHOULDER ARTHROSCOPY WITH SUBACROMIAL DECOMPRESSION Right 09/01/2019   Procedure: RIGHT SHOULDER ARTHROSCOPY WITH EXTENSIVE DEBRIDEMENT AND SUBACROMIAL DECOMPRESSION;  Surgeon: Jerri Kay HERO, MD;  Location: [facility];  Service: Orthopedics;  Laterality: Right;   VENTRAL HERNIA REPAIR N/A 08/31/2018   Procedure: HERNIA REPAIR VENTRAL ADULT;  Surgeon: Desiderio Schanz, MD;  Location: [facility];  Service: General;  Laterality: N/A;    Home Medications: Prior to Admission medications   Medication Sig Start Date End Date Taking? Authorizing Provider  buPROPion (WELLBUTRIN SR) 150 MG 12 hr tablet Take 1 tablet (150 mg total) by mouth 2 (two) times daily. Start at 1 tab daily for 3 days then increase to 1 tab twice daily 12/18/23   Newlin, Enobong, MD  Gauze Pads & Dressings (GAUZE DRESSING) 4X4 PADS 4 each by Does not apply route daily at 6 (six) AM. Patient not taking: Reported on 12/18/2023 01/04/22   Dasie Leonor CROME, MD  triamcinolone  cream (  KENALOG ) 0.1 % Apply 1 Application topically 2 (two) times daily. 12/18/23   Newlin, Enobong, MD  Ubrogepant  (UBRELVY ) 100 MG TABS Take 1 tab at the onset of a migraine. May repeat in 2 hours if migraine persist. Max daily dose 200mg  12/18/23   Newlin, Enobong, MD    Allergies: Allergies  Allergen Reactions   Cyclobenzaprine  Hives and Nausea Only   Depakote [Divalproex Sodium] Other (See Comments)    made him crazy   Ritalin [Methylphenidate Hcl] Other (See Comments)    Increased hyper activeness    Fentanyl  And  Related Other (See Comments)    In recovery   Hydrocodone  Other (See Comments)    In recovery   Morphine And Codeine  Other (See Comments)    recovery   Oxycodone  Other (See Comments)    In recovery   Naproxen Nausea Only    Acid reflux   Other Other (See Comments)    [facility]-pt states skin became reddened and he tasted metal (08-2018)   Tramadol Other (See Comments)    Acid reflux    Social History:  reports that he has been smoking cigarettes. He has a 11.3 pack-year smoking history. He has never been exposed to tobacco smoke. He has quit using smokeless tobacco. He reports that he does not currently use alcohol. He reports that he does not currently use drugs.   Family History: Family History  Problem Relation Age of Onset   Hypertension Mother    Diabetes Mother    Kidney cancer Mother    Diabetes Maternal Aunt    Diabetes Maternal Grandmother    Cancer Maternal Grandmother        unknown type. doing chemo   Cancer Maternal Grandfather    Stomach cancer Neg Hx    Pancreatic cancer Neg Hx    Esophageal cancer Neg Hx    Liver disease Neg Hx    Colon cancer Neg Hx    Rectal cancer Neg Hx     Review of Systems: Review of Systems  Constitutional:  Negative for chills and fever.  HENT:  Negative for hearing loss.   Respiratory:  Negative for shortness of breath.   Cardiovascular:  Negative for chest pain.  Gastrointestinal:  Positive for abdominal pain. Negative for nausea and vomiting.  Genitourinary:  Negative for dysuria.  Musculoskeletal:  Negative for myalgias.  Skin:        Lipomas of right upper arm, right forearm, left forearm, and bilateral thighs  Neurological:  Negative for dizziness.  Psychiatric/Behavioral:  Negative for depression.     Physical Exam BP 120/79   Pulse 84   Temp 98 F (36.7 C) (Oral)   Ht 5' 4 (1.626 m)   Wt 173 lb 9.6 oz (78.7 kg)   SpO2 97%   BMI 29.80 kg/m  CONSTITUTIONAL: No acute distress HEENT:  Normocephalic, atraumatic,  extraocular motion intact. NECK: Trachea is midline, and there is no jugular venous distension.  RESPIRATORY:  Lungs are clear, and breath sounds are equal bilaterally. Normal respiratory effort without pathologic use of accessory muscles. CARDIOVASCULAR: Heart is regular without murmurs, gallops, or rubs. GI: The abdomen is soft, non-distended, with mild tenderness to palpation in the epigastric area at the site of prior hernia repair.  The patient does have a small recurrence of his incisional hernia with what appears to be preperitoneal fat bulging through.  There is mild discomfort when he coughs in a portion of the area but the hernia is reducible.  The hernia  is likely about 1 cm in size. MUSCULOSKELETAL:  Normal muscle strength and tone in all four extremities.  No peripheral edema or cyanosis. SKIN: The patient has multiple lipomas.  In the right upper extremity, the patient has a 2 cm mass in the ventral aspect of the upper arm, a 1.5 cm mass in the area near the elbow crease and a 1.5 x 1 cm mass in the dorsal aspect of the right forearm.  All severe mobile, soft, without any erythema or drainage.  In the left forearm, the patient has a 1.5 cm mass at the dorsal aspect.  Also soft, mobile, without any erythema or drainage.  The patient has 2 masses in the lateral aspect of the left upper thigh measuring about 1 cm each also soft, mobile, without overlying erythema or induration.  And lastly the patient has a 1.5 cm mass in the right posterior upper thigh which is also soft, mobile, without overlying erythema or induration. NEUROLOGIC:  Motor and sensation is grossly normal.  Cranial nerves are grossly intact. PSYCH:  Alert and oriented to person, place and time. Affect is normal.  Laboratory Analysis: Labs from 06/18/2023: Sodium 140, potassium 4.7, chloride 105, CO2 22, BUN 13, creatinine 0.79.  LFTs within normal limits.  WBC 8.1, hemoglobin 15.6, hematocrit 46.2, platelets 292.  Hemoglobin A1c  5.6.  Imaging: No results found.  Assessment and Plan: This is a 33 y.o. 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  - Discussed with patient the findings on exam today both of the lipomas and the recurrent hernia.  Discussed with the patient that the masses that he has are most likely lipomas which are benign masses which can grow slowly but do not penetrate through tissues.  However sometimes these masses can cause discomfort which is where he is having particular in the right upper arm.  He is interested in removing these masses to prevent any further issues.  At the same time he is also having discomfort from the recurrent ventral hernia he would like to address everything if possible.  The masses in the thigh areas are less concerning for him so ideally he would like to do everything in 1 general setting. - Discussed with patient that we could proceed with a robotic assisted recurrent ventral hernia repair followed by excision of the lipomas in bilateral upper extremities.  We could then do the thigh lipomas as an outpatient setting in the office.  He is in agreement with this.  Reviewed with him the surgical plan at length including the planned incisions, risks of bleeding, infection, injury to surrounding structures, that this would be an outpatient procedure, postoperative activity restrictions, pain control, and he is willing to proceed. - We will schedule him for surgery on 02/03/2024.  All of his questions have been answered.  I spent 55 minutes dedicated to the care of this patient on the date of this encounter to include pre-visit review of records, face-to-face time with the patient discussing diagnosis and management, and any post-visit coordination of care.   Aloysius Sheree Plant, MD [facility]

## 2024-01-06 ENCOUNTER — Telehealth: Payer: Self-pay | Admitting: Surgery

## 2024-01-06 LAB — LP+NON-HDL CHOLESTEROL
Cholesterol, Total: 215 mg/dL — ABNORMAL HIGH (ref 100–199)
HDL: 47 mg/dL (ref >39)
LDL Chol Calc (NIH): 150 mg/dL — ABNORMAL HIGH (ref 0–99)
Total Non-HDL-Chol (LDL+VLDL): 168 mg/dL — ABNORMAL HIGH (ref 0–129)
Triglycerides: 101 mg/dL (ref 0–149)
VLDL Cholesterol Cal: 18 mg/dL (ref 5–40)

## 2024-01-06 LAB — TREPONEMAL ANTIBODIES, TPPA: Treponemal Antibodies, TPPA: NONREACTIVE

## 2024-01-06 LAB — RPR W/REFLEX TO TREPSURE: RPR: NONREACTIVE

## 2024-01-06 LAB — HCV INTERPRETATION

## 2024-01-06 LAB — HCV AB W REFLEX TO QUANT PCR: HCV Ab: NONREACTIVE

## 2024-01-06 NOTE — Telephone Encounter (Signed)
 Patient has been advised of Pre-Admission date/time, and Surgery date at [facility].  Surgery Date: 02/03/24 Preadmission Testing Date: Preadmissions to call patient with appointment.   Patient informed of the scheduling process and surgery information given at time of office visit.   Patient has been made aware to call [phone], between 1-3:00pm the day before surgery, to find out what time to arrive for surgery.

## 2024-01-07 LAB — URINE CYTOLOGY ANCILLARY ONLY
Chlamydia: NEGATIVE
Comment: NEGATIVE
Comment: NEGATIVE
Comment: NORMAL
Neisseria Gonorrhea: NEGATIVE
Trichomonas: NEGATIVE

## 2024-01-08 ENCOUNTER — Ambulatory Visit: Payer: Self-pay | Admitting: Family Medicine

## 2024-01-19 ENCOUNTER — Encounter: Payer: Self-pay | Admitting: Radiology

## 2024-01-27 ENCOUNTER — Encounter
Admission: RE | Admit: 2024-01-27 | Discharge: 2024-01-27 | Disposition: A | Discharge status: Home / Self Care | Payer: MEDICAID | Source: Ambulatory Visit | Attending: Surgery | Admitting: Surgery

## 2024-01-27 ENCOUNTER — Other Ambulatory Visit: Payer: Self-pay

## 2024-01-27 HISTORY — DX: Bipolar disorder, unspecified: F31.9

## 2024-01-27 HISTORY — DX: Ventral hernia without obstruction or gangrene: K43.9

## 2024-01-27 HISTORY — DX: Benign lipomatous neoplasm, unspecified: D17.9

## 2024-01-27 NOTE — Patient Instructions (Addendum)
 Your procedure is scheduled on: 02/03/24 - Tuesday Report to the Registration Desk on the 1st floor of the [facility]. To find out your arrival time, please call [phone] between 1PM - 3PM on: 02/02/24 - Monday If your arrival time is 6:00 am, do not arrive before that time as the [facility] entrance doors do not open until 6:00 am.  REMEMBER: Instructions that are not followed completely may result in serious medical risk, up to and including death; or upon the discretion of your surgeon and anesthesiologist your surgery may need to be rescheduled.  Do not eat food after midnight the night before surgery.  No gum chewing or hard candies.  You may however, drink CLEAR liquids up to 2 hours before you are scheduled to arrive for your surgery. Do not drink anything within 2 hours of your scheduled arrival time.  Clear liquids include: - water  - apple juice without pulp - gatorade (not RED colors) - black coffee or tea (Do NOT add milk or creamers to the coffee or tea) Do NOT drink anything that is not on this list.  One week prior to surgery: Stop Anti-inflammatories (NSAIDS) such as Advil , Aleve, Ibuprofen , Motrin , Naproxen, Naprosyn and Aspirin based products such as Excedrin, Goody's Powder, BC Powder. You may take Tylenol  if needed for pain up until the day of surgery.  Stop ANY OVER THE COUNTER supplements until after surgery.  ON THE DAY OF SURGERY ONLY TAKE THESE MEDICATIONS WITH SIPS OF WATER: none  No Alcohol for 24 hours before or after surgery.  No Smoking including e-cigarettes for 24 hours before surgery.  No chewable tobacco products for at least 6 hours before surgery.  No nicotine  patches on the day of surgery.  Do not use any recreational drugs for at least a week (preferably 2 weeks) before your surgery.  Please be advised that the combination of cocaine and anesthesia may have negative outcomes, up to and including death. If you test positive for  cocaine, your surgery will be cancelled.  On the morning of surgery brush your teeth with toothpaste and water, you may rinse your mouth with mouthwash if you wish. Do not swallow any toothpaste or mouthwash.  Use CHG Soap or wipes as directed on instruction sheet.  Do not wear jewelry, make-up, hairpins, clips or nail polish.  For welded (permanent) jewelry: bracelets, anklets, waist bands, etc.  Please have this removed prior to surgery.  If it is not removed, there is a chance that hospital personnel will need to cut it off on the day of surgery.  Do not wear lotions, powders, or perfumes.   Do not shave body hair from the neck down 48 hours before surgery.  Contact lenses, hearing aids and dentures may not be worn into surgery.  Do not bring valuables to the hospital. [facility] is not responsible for any missing/lost belongings or valuables.   Notify your doctor if there is any change in your medical condition (cold, fever, infection).  Wear comfortable clothing (specific to your surgery type) to the hospital.  After surgery, you can help prevent lung complications by doing breathing exercises.  Take deep breaths and cough every 1-2 hours. Your doctor may order a device called an Incentive Spirometer to help you take deep breaths.  If you are being admitted to the hospital overnight, leave your suitcase in the car. After surgery it may be brought to your room.  In case of increased patient census, it may  be necessary for you, the patient, to continue your postoperative care in the Same Day Surgery department.  If you are being discharged the day of surgery, you will not be allowed to drive home. You will need a responsible individual to drive you home and stay with you for 24 hours after surgery.                                                                                                              Preparing for Surgery with CHLORHEXIDINE  GLUCONATE (CHG)  Soap  Chlorhexidine  Gluconate (CHG) Soap  o An antiseptic cleaner that kills germs and bonds with the skin to continue killing germs even after washing  o Used for showering the night before surgery and morning of surgery  Before surgery, you can play an important role by reducing the number of germs on your skin.  CHG (Chlorhexidine  gluconate) soap is an antiseptic cleanser which kills germs and bonds with the skin to continue killing germs even after washing.  Please do not use if you have an allergy to CHG or antibacterial soaps. If your skin becomes reddened/irritated stop using the CHG.  1. Shower the NIGHT BEFORE SURGERY with CHG soap.  2. If you choose to wash your hair, wash your hair first as usual with your normal shampoo.  3. After shampooing, rinse your hair and body thoroughly to remove the shampoo.  4. Use CHG as you would any other liquid soap. You can apply CHG directly to the skin and wash gently with a clean washcloth.  5. Apply the CHG soap to your body only from the neck down. Do not use on open wounds or open sores. Avoid contact with your eyes, ears, mouth, and genitals (private parts). Wash face and genitals (private parts) with your normal soap.  6. Wash thoroughly, paying special attention to the area where your surgery will be performed.  7. Thoroughly rinse your body with warm water.  8. Do not shower/wash with your normal soap after using and rinsing off the CHG soap.  9. Do not use lotions, oils, etc., after showering with CHG.  10. Pat yourself dry with a clean towel.  11. Wear clean pajamas to bed the night before surgery.  12. Place clean sheets on your bed the night of your shower and do not sleep with pets.  13. Do not apply any deodorants/lotions/powders.  14. Please wear clean clothes to the hospital.  15. Remember to brush your teeth with your regular toothpaste.  If you are taking public transportation, you will need to have a  responsible individual with you.  Please call the Pre-admissions Testing Dept. at [phone] if you have any questions about these instructions.  Surgery Visitation Policy:  Patients having surgery or a procedure may have two visitors.  Children under the age of 29 must have an adult with them who is not the patient.  Inpatient Visitation:    Visiting hours are 7 a.m. to 8 p.m. Up to four visitors are allowed at one  time in a patient room. The visitors may rotate out with other people during the day.  One visitor age 65 or older may stay with the patient overnight and must be in the room by 8 p.m.   [employer]ch[employer], [employer] to address health-related social needs:  [email or web address]

## 2024-02-02 ENCOUNTER — Encounter: Payer: Self-pay | Admitting: Anesthesiology

## 2024-02-02 MED ORDER — GABAPENTIN 300 MG PO CAPS: 300.0000 mg | ORAL_CAPSULE | ORAL | Status: DC

## 2024-02-02 MED ORDER — CEFAZOLIN SODIUM-DEXTROSE 2-4 GM/100ML-% IV SOLN: 2.0000 g | INTRAVENOUS | Status: DC

## 2024-02-02 MED ORDER — BUPIVACAINE LIPOSOME 1.3 % IJ SUSP: 20.0000 mL | Freq: Once | INTRAMUSCULAR | Status: DC

## 2024-02-02 MED ORDER — LACTATED RINGERS IV SOLN: INTRAVENOUS | Status: DC

## 2024-02-02 MED ORDER — CHLORHEXIDINE GLUCONATE CLOTH 2 % EX PADS: 6.0000 | MEDICATED_PAD | Freq: Once | CUTANEOUS | Status: DC

## 2024-02-02 MED ORDER — ACETAMINOPHEN 500 MG PO TABS: 1000.0000 mg | ORAL_TABLET | ORAL | Status: DC

## 2024-02-03 ENCOUNTER — Encounter: Admission: RE | Payer: Self-pay | Source: Home / Self Care

## 2024-02-03 ENCOUNTER — Telehealth: Payer: Self-pay | Admitting: Surgery

## 2024-02-03 ENCOUNTER — Ambulatory Visit: Admission: RE | Admit: 2024-02-03 | Payer: MEDICAID | Source: Home / Self Care | Admitting: Surgery

## 2024-02-03 ENCOUNTER — Encounter: Payer: Self-pay | Admitting: Anesthesiology

## 2024-02-03 DIAGNOSIS — D1724 Benign lipomatous neoplasm of skin and subcutaneous tissue of left leg: Secondary | ICD-10-CM

## 2024-02-03 DIAGNOSIS — D1720 Benign lipomatous neoplasm of skin and subcutaneous tissue of unspecified limb: Secondary | ICD-10-CM

## 2024-02-03 DIAGNOSIS — D1723 Benign lipomatous neoplasm of skin and subcutaneous tissue of right leg: Secondary | ICD-10-CM

## 2024-02-03 DIAGNOSIS — D1721 Benign lipomatous neoplasm of skin and subcutaneous tissue of right arm: Secondary | ICD-10-CM

## 2024-02-03 DIAGNOSIS — D1722 Benign lipomatous neoplasm of skin and subcutaneous tissue of left arm: Secondary | ICD-10-CM

## 2024-02-03 DIAGNOSIS — K432 Incisional hernia without obstruction or gangrene: Secondary | ICD-10-CM

## 2024-02-03 SURGERY — REPAIR, HERNIA, VENTRAL, ROBOT-ASSISTED
Anesthesia: General

## 2024-02-03 NOTE — Telephone Encounter (Signed)
 Patient did not show for surgery this morning for ventral hernia repair with Dr. Desiderio.  Outgoing call is made to the patient and left detailed message for him to call so that we can get surgery rescheduled for him.

## 2024-02-04 ENCOUNTER — Telehealth: Payer: Self-pay | Admitting: Surgery

## 2024-02-04 NOTE — Telephone Encounter (Signed)
 Patient cancelled surgery scheduled for 02/03/24 with Dr. Desiderio.  Multiple attempts have been made to call patient for rescheduling.  Detailed message has been left for patient to call the office.

## 2024-02-19 ENCOUNTER — Telehealth: Payer: Self-pay | Admitting: Surgery

## 2024-02-19 NOTE — Telephone Encounter (Signed)
 To date multiple messages have been left for patient to call the office for rescheduling of surgery for hernia repair and lipoma removals with Dr. Desiderio.  No return call to date.  Patient last saw in office on 01/05/24 and if calls back will need follow up in office for re-evaluation.

## 2024-06-17 ENCOUNTER — Ambulatory Visit: Payer: MEDICAID | Admitting: Family Medicine
